# Patient Record
Sex: Male | Born: 1960 | Race: White | Hispanic: No | Marital: Single | Smoking: Current every day smoker
Health system: Southern US, Community
[De-identification: ages and names within clinical notes are randomized; demographics above are authoritative.]

## PROBLEM LIST (undated history)

## (undated) DIAGNOSIS — S2249XA Multiple fractures of ribs, unspecified side, initial encounter for closed fracture: Secondary | ICD-10-CM

## (undated) DIAGNOSIS — IMO0002 Reserved for concepts with insufficient information to code with codable children: Secondary | ICD-10-CM

## (undated) DIAGNOSIS — K219 Gastro-esophageal reflux disease without esophagitis: Secondary | ICD-10-CM

## (undated) DIAGNOSIS — IMO0001 Reserved for inherently not codable concepts without codable children: Secondary | ICD-10-CM

## (undated) DIAGNOSIS — B3781 Candidal esophagitis: Secondary | ICD-10-CM

## (undated) DIAGNOSIS — I10 Essential (primary) hypertension: Secondary | ICD-10-CM

## (undated) DIAGNOSIS — F32A Depression, unspecified: Secondary | ICD-10-CM

## (undated) DIAGNOSIS — R519 Headache, unspecified: Secondary | ICD-10-CM

## (undated) DIAGNOSIS — E785 Hyperlipidemia, unspecified: Secondary | ICD-10-CM

## (undated) DIAGNOSIS — R51 Headache: Secondary | ICD-10-CM

## (undated) DIAGNOSIS — S02113A Unspecified occipital condyle fracture, initial encounter for closed fracture: Secondary | ICD-10-CM

## (undated) DIAGNOSIS — M542 Cervicalgia: Secondary | ICD-10-CM

## (undated) DIAGNOSIS — R1319 Other dysphagia: Secondary | ICD-10-CM

## (undated) DIAGNOSIS — J449 Chronic obstructive pulmonary disease, unspecified: Secondary | ICD-10-CM

## (undated) DIAGNOSIS — K4090 Unilateral inguinal hernia, without obstruction or gangrene, not specified as recurrent: Secondary | ICD-10-CM

## (undated) DIAGNOSIS — D509 Iron deficiency anemia, unspecified: Secondary | ICD-10-CM

## (undated) HISTORY — DX: Other dysphagia: R13.19

## (undated) HISTORY — PX: LUMBAR FUSION: SHX111

## (undated) HISTORY — DX: Hyperlipidemia, unspecified: E78.5

## (undated) HISTORY — DX: Unspecified occipital condyle fracture, initial encounter for closed fracture: S02.113A

## (undated) HISTORY — DX: Iron deficiency anemia, unspecified: D50.9

## (undated) HISTORY — DX: Essential (primary) hypertension: I10

## (undated) HISTORY — DX: Chronic obstructive pulmonary disease, unspecified: J44.9

## (undated) HISTORY — PX: BACK SURGERY: SHX140

## (undated) HISTORY — DX: Cervicalgia: M54.2

## (undated) HISTORY — DX: Unilateral inguinal hernia, without obstruction or gangrene, not specified as recurrent: K40.90

## (undated) HISTORY — DX: Multiple fractures of ribs, unspecified side, initial encounter for closed fracture: S22.49XA

## (undated) HISTORY — PX: OTHER SURGICAL HISTORY: SHX169

## (undated) HISTORY — DX: Reserved for concepts with insufficient information to code with codable children: IMO0002

## (undated) HISTORY — DX: Candidal esophagitis: B37.81

---

## 2005-08-30 ENCOUNTER — Emergency Department (HOSPITAL_COMMUNITY): Admission: EM | Admit: 2005-08-30 | Discharge: 2005-08-30 | Payer: Self-pay | Admitting: Emergency Medicine

## 2008-11-24 ENCOUNTER — Emergency Department (HOSPITAL_COMMUNITY): Admission: EM | Admit: 2008-11-24 | Discharge: 2008-11-24 | Payer: Self-pay | Admitting: Emergency Medicine

## 2009-03-06 ENCOUNTER — Emergency Department (HOSPITAL_COMMUNITY): Admission: EM | Admit: 2009-03-06 | Discharge: 2009-03-06 | Payer: Self-pay | Admitting: Emergency Medicine

## 2009-03-14 ENCOUNTER — Emergency Department (HOSPITAL_COMMUNITY): Admission: EM | Admit: 2009-03-14 | Discharge: 2009-03-14 | Payer: Self-pay | Admitting: Emergency Medicine

## 2009-03-16 ENCOUNTER — Encounter (HOSPITAL_COMMUNITY): Admission: RE | Admit: 2009-03-16 | Discharge: 2009-04-15 | Payer: Self-pay | Admitting: Emergency Medicine

## 2010-03-30 ENCOUNTER — Emergency Department (HOSPITAL_COMMUNITY): Admission: EM | Admit: 2010-03-30 | Discharge: 2010-03-30 | Payer: Self-pay | Admitting: Emergency Medicine

## 2011-04-23 ENCOUNTER — Encounter: Payer: Self-pay | Admitting: Emergency Medicine

## 2011-04-23 ENCOUNTER — Emergency Department (HOSPITAL_COMMUNITY)
Admission: EM | Admit: 2011-04-23 | Discharge: 2011-04-23 | Disposition: A | Discharge status: Home / Self Care | Payer: Self-pay | Attending: Emergency Medicine | Admitting: Emergency Medicine

## 2011-04-23 DIAGNOSIS — M79609 Pain in unspecified limb: Secondary | ICD-10-CM | POA: Insufficient documentation

## 2011-04-23 DIAGNOSIS — M5416 Radiculopathy, lumbar region: Secondary | ICD-10-CM

## 2011-04-23 DIAGNOSIS — Z79899 Other long term (current) drug therapy: Secondary | ICD-10-CM | POA: Insufficient documentation

## 2011-04-23 DIAGNOSIS — F17200 Nicotine dependence, unspecified, uncomplicated: Secondary | ICD-10-CM | POA: Insufficient documentation

## 2011-04-23 DIAGNOSIS — IMO0002 Reserved for concepts with insufficient information to code with codable children: Secondary | ICD-10-CM | POA: Insufficient documentation

## 2011-04-23 MED ORDER — PREDNISONE 20 MG PO TABS
60.0000 mg | ORAL_TABLET | Freq: Once | ORAL | Status: AC
  Administered 2011-04-23: 60 mg via ORAL
  Filled 2011-04-23: qty 3

## 2011-04-23 MED ORDER — PREDNISONE 50 MG PO TABS: 50.0000 mg | ORAL_TABLET | Freq: Every day | ORAL | Status: AC

## 2011-04-23 MED ORDER — OXYCODONE-ACETAMINOPHEN 5-325 MG PO TABS: ORAL_TABLET | ORAL | Status: DC

## 2011-04-23 MED ORDER — OXYCODONE-ACETAMINOPHEN 5-325 MG PO TABS
1.0000 | ORAL_TABLET | Freq: Once | ORAL | Status: AC
  Administered 2011-04-23: 1 via ORAL
  Filled 2011-04-23: qty 1

## 2011-04-23 NOTE — ED Notes (Addendum)
Patient c/o right leg pain x1 week. Patient denies any recent injury. Reports history of back surgery. Had laceration to leg last year, had problems in which he had to go to wound clinic.

## 2011-04-23 NOTE — ED Provider Notes (Signed)
History     CSN: [id number] Arrival date & time: 04/23/2011 11:25 AM  Chief Complaint  Patient presents with  . Leg Pain    (Consider location/radiation/quality/duration/timing/severity/associated sxs/prior treatment) HPI Comments: States he has known ruptured disc.    Patient is a 50 y.o. male presenting with leg pain. The history is provided by the patient. No language interpreter was used.  Leg Pain  The incident occurred more than 1 week ago. There was no injury mechanism. Pain location: R calf to toes. The quality of the pain is described as throbbing. The pain is at a severity of 8/10. The pain has been constant since onset. The symptoms are aggravated by bearing weight (bending). He has tried nothing for the symptoms.    History reviewed. No pertinent past medical history.  Past Surgical History  Procedure Date  . Back surgery     Family History  Problem Relation Age of Onset  . Diabetes Mother     History  Substance Use Topics  . Smoking status: Current Everyday Smoker -- 3.5 packs/day for 37 years    Types: Cigarettes  . Smokeless tobacco: Never Used  . Alcohol Use: 7.2 oz/week    12 Cans of beer per week      Review of Systems  All other systems reviewed and are negative.    Allergies  Aleve  Home Medications   Current Outpatient Rx  Name Route Sig Dispense Refill  . BC FAST PAIN RELIEF PO Oral Take 2 packets by mouth daily as needed. For pain     . IBUPROFEN 200 MG PO TABS Oral Take 800 mg by mouth every 6 (six) hours as needed. For pain       BP 131/93  Pulse 72  Temp(Src) 97.9 F (36.6 C) (Oral)  Resp 16  Ht 6\' 3"  (1.905 m)  Wt 185 lb (83.915 kg)  BMI 23.12 kg/m2  SpO2 100%  Physical Exam  Nursing note and vitals reviewed. Constitutional: He is oriented to person, place, and time. Vital signs are normal. He appears well-developed and well-nourished.  HENT:  Head: Normocephalic and atraumatic.  Right Ear: External ear normal.  Left  Ear: External ear normal.  Nose: Nose normal.  Mouth/Throat: No oropharyngeal exudate.  Eyes: Conjunctivae and EOM are normal. Pupils are equal, round, and reactive to light. Right eye exhibits no discharge. Left eye exhibits no discharge. No scleral icterus.  Neck: Normal range of motion. Neck supple. No JVD present. No tracheal deviation present. No thyromegaly present.  Cardiovascular: Normal rate, regular rhythm, normal heart sounds, intact distal pulses and normal pulses.  Exam reveals no gallop and no friction rub.   No murmur heard. Pulmonary/Chest: Effort normal and breath sounds normal. No stridor. No respiratory distress. He has no wheezes. He has no rales. He exhibits no tenderness.  Abdominal: Soft. Normal appearance and bowel sounds are normal. He exhibits no distension and no mass. There is no tenderness. There is no rebound and no guarding.  Musculoskeletal: Normal range of motion. He exhibits no edema and no tenderness.       Right lower leg: He exhibits no tenderness, no bony tenderness, no swelling, no edema, no deformity and no laceration.       Legs: Lymphadenopathy:    He has no cervical adenopathy.  Neurological: He is alert and oriented to person, place, and time. He has normal reflexes. No cranial nerve deficit or sensory deficit. Coordination normal. GCS eye subscore is 4. GCS verbal subscore  is 5. GCS motor subscore is 6.  Skin: Skin is warm and dry. No rash noted. He is not diaphoretic.  Psychiatric: He has a normal mood and affect. His speech is normal and behavior is normal. Judgment and thought content normal. Cognition and memory are normal.    ED Course  Procedures (including critical care time)  Labs Reviewed - No data to display No results found.   No diagnosis found.    MDM          Worthy Rancher, PA 04/23/11 1247

## 2011-04-23 NOTE — ED Provider Notes (Signed)
Medical screening examination/treatment/procedure(s) were performed by non-physician practitioner and as supervising physician I was immediately available for consultation/collaboration.  Laelani Vasko, MD 04/23/11 1523 

## 2011-04-23 NOTE — ED Notes (Signed)
Right side leg pain that pt states that he has had "for awhile" became worse over the past week or so, pain starts at buttock area sometimes and radiates down, worse pain is from knee level down, pt admits to weakness in right leg at times since the pain has become worse, hx of lower back problems, denies any injury, deneis any problems with bowel or bladder incontinence. Cms intact distal

## 2011-10-09 ENCOUNTER — Emergency Department (HOSPITAL_COMMUNITY)
Admission: EM | Admit: 2011-10-09 | Discharge: 2011-10-09 | Disposition: A | Discharge status: Home / Self Care | Payer: Self-pay | Attending: Emergency Medicine | Admitting: Emergency Medicine

## 2011-10-09 ENCOUNTER — Encounter (HOSPITAL_COMMUNITY): Payer: Self-pay | Admitting: *Deleted

## 2011-10-09 DIAGNOSIS — K4090 Unilateral inguinal hernia, without obstruction or gangrene, not specified as recurrent: Secondary | ICD-10-CM | POA: Insufficient documentation

## 2011-10-09 DIAGNOSIS — F17200 Nicotine dependence, unspecified, uncomplicated: Secondary | ICD-10-CM | POA: Insufficient documentation

## 2011-10-09 MED ORDER — IBUPROFEN 800 MG PO TABS: 800.0000 mg | ORAL_TABLET | Freq: Three times a day (TID) | ORAL | Status: AC

## 2011-10-09 MED ORDER — HYDROCODONE-ACETAMINOPHEN 5-325 MG PO TABS: 1.0000 | ORAL_TABLET | Freq: Four times a day (QID) | ORAL | Status: AC | PRN

## 2011-10-09 NOTE — Discharge Instructions (Signed)
Hernia A hernia happens when an organ inside your body pushes out through a weak spot in your belly (abdominal) wall. Most hernias get worse over time. They can often be pushed back into place (reduced). Surgery may be needed to repair hernias that cannot be pushed into place. HOME CARE  Keep doing normal activities.   Avoid lifting more than 10 pounds (4.5 kilograms).   Cough gently and avoid straining. Over time, these things will:   Increase your hernia size.   Irritate your hernia.   Break down hernia repairs.   Stop smoking.   Do not wear anything tight over your hernia. Do not keep the hernia in with an outside bandage.   Eat food that is high in fiber (fruit, vegetables, whole grains).   Drink enough fluids to keep your pee (urine) clear or pale yellow.   Take medicines to make your poop soft (stool softeners) if you cannot poop (constipated).  GET HELP RIGHT AWAY IF:   You have a fever.   You have belly pain that gets worse.   You feel sick to your stomach (nauseous) and throw up (vomit).   Your skin starts to bulge out.   Your hernia turns a different color, feels hard, or is tender.   You have increased pain or puffiness (swelling) around the hernia.   You poop more or less often.   Your poop does not look the way normally does.   You have watery poop (diarrhea).   You cannot push the hernia back in place by applying gentle pressure while lying down.  MAKE SURE YOU:   Understand these instructions.   Will watch your condition.   Will get help right away if you are not doing well or get worse.  Document Released: 12/21/2009 Document Revised: 06/22/2011 Document Reviewed: 12/21/2009 [facility] Patient Information 2012 [place], [place].  Called Dr. Lovell Sheehan the surgeon for followup and repair of your left inguinal hernia.

## 2011-10-09 NOTE — ED Notes (Signed)
Pt c/o left groin pain, ?hernia, states that the pain started last month. Denies any injury.

## 2011-10-09 NOTE — ED Provider Notes (Signed)
History   This chart was scribed for Shelda Jakes, MD by Sofie Rower. The patient was seen in room APFT20/APFT20 and the patient's care was started at 3:52PM.    CSN: [id number]  Arrival date & time 10/09/11  1527   First MD Initiated Contact with Patient 10/09/11 1547      Chief Complaint  Patient presents with  . Hernia    (Consider location/radiation/quality/duration/timing/severity/associated sxs/prior treatment) HPI  Sean Thomas is a 51 y.o. male who presents to the Emergency Department complaining of moderate, constant groin pain located in the left groin onset one month ago (Feb. 10th) with associated symptoms of back pain. Pt states the pain is a 10/10. Modifying factors include taking advill with no relief. Pt has a hx of back surgery.   Pt denies nausea, vomiting, chest pain, abdominal pain, swelling in legs, dysuria.      Past Surgical History  Procedure Date  . Back surgery     Family History  Problem Relation Age of Onset  . Diabetes Mother     History  Substance Use Topics  . Smoking status: Current Everyday Smoker -- 3.5 packs/day for 37 years    Types: Cigarettes  . Smokeless tobacco: Never Used  . Alcohol Use: 7.2 oz/week    12 Cans of beer per week      Review of Systems  All other systems reviewed and are negative.    10 Systems reviewed and are negative for acute change except as noted in the HPI.   Allergies  Aleve  Home Medications   Current Outpatient Rx  Name Route Sig Dispense Refill  . BC FAST PAIN RELIEF PO Oral Take 2 packets by mouth daily as needed. For pain     . HYDROCODONE-ACETAMINOPHEN 5-325 MG PO TABS Oral Take 1-2 tablets by mouth every 6 (six) hours as needed for pain. 14 tablet 0  . IBUPROFEN 200 MG PO TABS Oral Take 800 mg by mouth every 6 (six) hours as needed. For pain     . IBUPROFEN 800 MG PO TABS Oral Take 1 tablet (800 mg total) by mouth 3 (three) times daily. 30 tablet 0  . OXYCODONE-ACETAMINOPHEN  5-325 MG PO TABS  One po q 6 hrs prn pain 20 tablet 0    BP 157/90  Pulse 75  Temp(Src) 97.3 F (36.3 C) (Oral)  Resp 20  Ht 6\' 3"  (1.905 m)  Wt 180 lb (81.647 kg)  BMI 22.50 kg/m2  SpO2 98%  Physical Exam  Nursing note and vitals reviewed. Constitutional: He is oriented to person, place, and time. He appears well-developed and well-nourished.  HENT:  Head: Normocephalic and atraumatic.  Nose: Nose normal.  Eyes: Conjunctivae and EOM are normal. No scleral icterus.  Neck: Neck supple. No thyromegaly present.  Cardiovascular: Normal rate, regular rhythm and normal heart sounds.  Exam reveals no gallop and no friction rub.   No murmur heard. Pulmonary/Chest: Breath sounds normal. No stridor. He has no wheezes. He has no rales. He exhibits no tenderness.  Abdominal: Bowel sounds are normal. He exhibits no distension. There is no tenderness. There is no rebound.  Genitourinary:       Circumcised, testicles centered bilaterally, non tender, right side has no hernia, small hernia left side, easily reducible.   Musculoskeletal: Normal range of motion. He exhibits no edema.  Lymphadenopathy:    He has no cervical adenopathy.  Neurological: He is alert and oriented to person, place, and time. Coordination normal.  Skin: Skin is warm and dry. No rash noted. No erythema.  Psychiatric: He has a normal mood and affect. His behavior is normal.    ED Course  Procedures (including critical care time)  DIAGNOSTIC STUDIES: Oxygen Saturation is 98% on room air, normal by my interpretation.    COORDINATION OF CARE:     Labs Reviewed - No data to display No results found.   1. Inguinal hernia       3:56PM- EDP at bedside discusses treatment plan.   MDM   Physical findings consistent with reducible left inguinal hernia. Will refer to Gen. Surgery for repair. No evidence of testicular mass or epididymitis.   I personally performed the services described in this documentation,  which was scribed in my presence. The recorded information has been reviewed and considered.       Shelda Jakes, MD 10/09/11 [id number]

## 2012-11-01 DIAGNOSIS — S2249XA Multiple fractures of ribs, unspecified side, initial encounter for closed fracture: Secondary | ICD-10-CM | POA: Insufficient documentation

## 2012-11-01 HISTORY — DX: Multiple fractures of ribs, unspecified side, initial encounter for closed fracture: S22.49XA

## 2012-11-21 DIAGNOSIS — M542 Cervicalgia: Secondary | ICD-10-CM

## 2012-11-21 DIAGNOSIS — S02113A Unspecified occipital condyle fracture, initial encounter for closed fracture: Secondary | ICD-10-CM

## 2012-11-21 HISTORY — DX: Cervicalgia: M54.2

## 2012-11-21 HISTORY — DX: Unspecified occipital condyle fracture, initial encounter for closed fracture: S02.113A

## 2013-08-27 ENCOUNTER — Ambulatory Visit: Payer: Medicaid Other | Admitting: Family Medicine

## 2013-09-17 ENCOUNTER — Telehealth: Payer: Self-pay | Admitting: Family Medicine

## 2013-09-25 NOTE — Telephone Encounter (Signed)
Patient got appt at another office

## 2013-12-11 ENCOUNTER — Ambulatory Visit (INDEPENDENT_AMBULATORY_CARE_PROVIDER_SITE_OTHER): Payer: Medicaid Other | Admitting: General Surgery

## 2013-12-11 ENCOUNTER — Encounter (INDEPENDENT_AMBULATORY_CARE_PROVIDER_SITE_OTHER): Payer: Self-pay

## 2013-12-11 ENCOUNTER — Telehealth (INDEPENDENT_AMBULATORY_CARE_PROVIDER_SITE_OTHER): Payer: Self-pay

## 2013-12-11 ENCOUNTER — Encounter (INDEPENDENT_AMBULATORY_CARE_PROVIDER_SITE_OTHER): Payer: Self-pay | Admitting: General Surgery

## 2013-12-11 VITALS — BP 124/76 | HR 80 | Temp 97.5°F | Ht 74.0 in | Wt 182.0 lb

## 2013-12-11 DIAGNOSIS — F17200 Nicotine dependence, unspecified, uncomplicated: Secondary | ICD-10-CM

## 2013-12-11 DIAGNOSIS — K4090 Unilateral inguinal hernia, without obstruction or gangrene, not specified as recurrent: Secondary | ICD-10-CM

## 2013-12-11 DIAGNOSIS — J441 Chronic obstructive pulmonary disease with (acute) exacerbation: Secondary | ICD-10-CM

## 2013-12-11 NOTE — Addendum Note (Signed)
Addended by: Illene Regulus on: 12/11/2013 10:16 AM   Modules accepted: Orders

## 2013-12-11 NOTE — Telephone Encounter (Signed)
LMOM of pt's sister and I also called the other sister Mortimer Fries but didn't leave her a message. I have scheduled the pt for an appt with a pulmonologist at [facility] to see Dr Baltazar Apo on 12/19/13 arrive at 1:30/1:45. I asked for a call back to confirm they received the appt time b/c this appt is for pulmonary clearance in order to get scheduled for surgery.

## 2013-12-11 NOTE — Progress Notes (Signed)
Patient ID: Sean Thomas, male   DOB: [birth year], 53 y.o.   MRN: [id number]  Chief Complaint  Patient presents with  . Incisional Hernia    HPI Sean Thomas is a 53 y.o. male.  The patient is a 53 year old male who is referred by Sean Mire, MD for evaluation of a left inguinal hernia. Patient states it's been there for several months. He states causing pain in his left inguinal area. He states this occurred while moving bricks.  He states he's noticed a bulge that time.  On the patient states he is an active smoker. He states he has COPD has oxygen at home but is not able to easily. He states he cannot get through grocery store without having to stop and catch his breath due to shortness of breath. He is on a daily inhaler as well as a rescue inhaler.  HPI  Past Medical History  Diagnosis Date  . COPD (chronic obstructive pulmonary disease)   . Hyperlipidemia   . Hypertension     Past Surgical History  Procedure Laterality Date  . Back surgery    . Back surgery      Family History  Problem Relation Age of Onset  . Diabetes Mother     Social History History  Substance Use Topics  . Smoking status: Current Every Day Smoker -- 3.50 packs/day for 37 years    Types: Cigarettes  . Smokeless tobacco: Never Used  . Alcohol Use: 7.2 oz/week    12 Cans of beer per week    Allergies  Allergen Reactions  . Aleve [Naproxen Sodium] Nausea And Vomiting    diarrhea    Current Outpatient Prescriptions  Medication Sig Dispense Refill  . Aspirin-Caffeine (BC FAST PAIN RELIEF PO) Take 2 packets by mouth daily as needed. For pain       . gabapentin (NEURONTIN) 100 MG capsule Take 100 mg by mouth 3 (three) times daily.      Marland Kitchen ibuprofen (ADVIL,MOTRIN) 200 MG tablet Take 800 mg by mouth every 6 (six) hours as needed. For pain       . oxyCODONE-acetaminophen (PERCOCET) 5-325 MG per tablet One po q 6 hrs prn pain  20 tablet  0   No current facility-administered medications for  this visit.    Review of Systems Review of Systems  Constitutional: Negative.   HENT: Negative.   Eyes: Negative.   Respiratory: Negative.   Cardiovascular: Negative.   Gastrointestinal: Negative.   Endocrine: Negative.   Neurological: Negative.     Blood pressure 124/76, pulse 80, temperature 97.5 F (36.4 C), height 6\' 2"  (1.88 m), weight 182 lb (82.555 kg).  Physical Exam Physical Exam  Constitutional: He is oriented to person, place, and time. He appears well-developed and well-nourished.  HENT:  Head: Normocephalic and atraumatic.  Eyes: Conjunctivae and EOM are normal. Pupils are equal, round, and reactive to light.  Neck: Normal range of motion. Neck supple.  Cardiovascular: Normal rate, regular rhythm and normal heart sounds.   Pulmonary/Chest: Effort normal and breath sounds normal.  Abdominal: Soft. Bowel sounds are normal. A hernia is present. Hernia confirmed positive in the left inguinal area. Hernia confirmed negative in the right inguinal area.  Musculoskeletal: Normal range of motion.  Neurological: He is alert and oriented to person, place, and time.  Skin: Skin is warm and dry.    Data Reviewed none  Assessment    53 year old male with a left inguinal hernia. Patient also has a history  of COPD, and is set up with home oxygen however does not use at home. The patient currently smokes.     Plan    1. We'll have the patient evaluated for any pulmonary issues, and to be optimized from a pulmonary point of view. After this is done we can schedule him for surgery. 2. Once receive clearance will schedule him for a laparoscopic left inguinal hernia repair with mesh. 3. All risks and benefits were discussed with the patient, to generally include infection, bleeding, damage to surrounding structures, acute and chronic nerve pain, and recurrence. Alternatives were offered and described.  All questions were answered and the patient voiced understanding of the  procedure and wishes to proceed at this point.         Ralene Ok 12/11/2013, 9:39 AM

## 2013-12-11 NOTE — Telephone Encounter (Signed)
Sister called back and confirmed she received your msg.

## 2013-12-18 ENCOUNTER — Encounter (INDEPENDENT_AMBULATORY_CARE_PROVIDER_SITE_OTHER): Payer: Self-pay

## 2013-12-19 ENCOUNTER — Ambulatory Visit (INDEPENDENT_AMBULATORY_CARE_PROVIDER_SITE_OTHER)
Admission: RE | Admit: 2013-12-19 | Discharge: 2013-12-19 | Disposition: A | Discharge status: Home / Self Care | Payer: Medicaid Other | Source: Ambulatory Visit | Attending: Emergency Medicine | Admitting: Emergency Medicine

## 2013-12-19 ENCOUNTER — Encounter: Payer: Self-pay | Admitting: Emergency Medicine

## 2013-12-19 ENCOUNTER — Ambulatory Visit (INDEPENDENT_AMBULATORY_CARE_PROVIDER_SITE_OTHER): Payer: Medicaid Other | Admitting: Emergency Medicine

## 2013-12-19 ENCOUNTER — Other Ambulatory Visit: Payer: Medicaid Other

## 2013-12-19 VITALS — BP 140/84 | HR 74 | Ht 74.0 in | Wt 184.4 lb

## 2013-12-19 DIAGNOSIS — K4090 Unilateral inguinal hernia, without obstruction or gangrene, not specified as recurrent: Secondary | ICD-10-CM

## 2013-12-19 DIAGNOSIS — R079 Chest pain, unspecified: Secondary | ICD-10-CM

## 2013-12-19 DIAGNOSIS — J449 Chronic obstructive pulmonary disease, unspecified: Secondary | ICD-10-CM

## 2013-12-19 DIAGNOSIS — J441 Chronic obstructive pulmonary disease with (acute) exacerbation: Secondary | ICD-10-CM

## 2013-12-19 DIAGNOSIS — J4489 Other specified chronic obstructive pulmonary disease: Secondary | ICD-10-CM

## 2013-12-19 HISTORY — DX: Unilateral inguinal hernia, without obstruction or gangrene, not specified as recurrent: K40.90

## 2013-12-19 NOTE — Patient Instructions (Signed)
We will change Spiriva to Anoro one inhalation daily You need to wear your Oxygen at 2L/min with walking and exertion CXR and lab work today Use albuterol (ProAir) 2 puffs if needed for shortness of breath We will refer you see cardiology for evaluation before any surgery.  Your COPD puts you at high risk for an operation, but this does not mean you cannot have an operation if the benefits outweigh the risks. We will forward this information to Dr Rosendo Gros.  Follow with Dr Lamonte Sakai in 1 month
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
## 2013-12-19 NOTE — Assessment & Plan Note (Addendum)
Clinically severe with ? response to Spiriva. 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 He has been given O2 for home use, does not use it.  - would like to try a change to Anoro to see if he benefits - he has cut down to 5 cigarettes a day > instructed him NOT to increase pre-op - walking oximetry today confirms that he does desat > needs to wear O2 with all exertion - a1-AT testing - CXR today - rov 1 - he will qualify for LDCT screening later

## 2013-12-19 NOTE — Addendum Note (Signed)
Addended by: Carlos American A on: 12/19/2013 02:56 PM   Modules accepted: Orders

## 2013-12-19 NOTE — Assessment & Plan Note (Signed)
He clearly has COPD, severe by spirometry. This puts him at high risk for any surgery or general anesthesia but does not preclude a surgery. In fact, he just had dental surgery that he tolerated well.  - needs cards risk stratification - will try to optimize fxn before any procedure - clarify o2 needs - instructed him to not increase his cigarettes.  -

## 2013-12-22 LAB — ALPHA-1-ANTITRYPSIN: A1 ANTITRYPSIN SER: 160 mg/dL (ref 83–199)

## 2013-12-24 ENCOUNTER — Telehealth: Payer: Self-pay | Admitting: Emergency Medicine

## 2013-12-24 NOTE — Telephone Encounter (Signed)
Attempted to call x1 LMTCB 

## 2013-12-24 NOTE — Telephone Encounter (Signed)
Pt's sister Joni Reining) has been advised of lab result per RB.   She verbalized understanding and had no further questions

## 2014-01-05 ENCOUNTER — Telehealth: Payer: Self-pay | Admitting: Emergency Medicine

## 2014-01-05 DIAGNOSIS — J449 Chronic obstructive pulmonary disease, unspecified: Secondary | ICD-10-CM

## 2014-01-05 NOTE — Telephone Encounter (Signed)
Spoke with Mandy  Pt needing OCD titration  Order sent to [facility]

## 2014-01-21 ENCOUNTER — Ambulatory Visit (INDEPENDENT_AMBULATORY_CARE_PROVIDER_SITE_OTHER): Payer: Medicaid Other | Admitting: Emergency Medicine

## 2014-01-21 ENCOUNTER — Encounter: Payer: Self-pay | Admitting: Emergency Medicine

## 2014-01-21 VITALS — BP 118/80 | HR 64 | Ht 74.0 in | Wt 183.4 lb

## 2014-01-21 DIAGNOSIS — J449 Chronic obstructive pulmonary disease, unspecified: Secondary | ICD-10-CM

## 2014-01-21 MED ORDER — UMECLIDINIUM-VILANTEROL 62.5-25 MCG/INH IN AEPB: 1.0000 | INHALATION_SPRAY | Freq: Every day | RESPIRATORY_TRACT | Status: DC
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
## 2014-01-21 NOTE — Patient Instructions (Signed)
Please continue the anoro every day.  Wear your oxygen with exertion.  Keep your Cardiology Appointment as planned.  We will work on stopping smoking. Our goal for now will be to cut to 15 cigarettes a day.  Follow with Dr Lamonte Sakai in 3 months or sooner if you have any problems.

## 2014-01-21 NOTE — Assessment & Plan Note (Signed)
Please continue the anoro every day.  Wear your oxygen with exertion.  Keep your Cardiology Appointment as planned.  We will work on stopping smoking. Our goal for now will be to cut to 15 cigarettes a day.  Follow with Dr Lamonte Sakai in 3 months or sooner if you have any problems.

## 2014-01-21 NOTE — Addendum Note (Signed)
Addended by: Carlos American A on: 01/21/2014 10:36 AM   Modules accepted: Orders

## 2014-01-27 ENCOUNTER — Encounter: Payer: Self-pay | Admitting: *Deleted

## 2014-01-28 ENCOUNTER — Encounter: Payer: Self-pay | Admitting: *Deleted

## 2014-01-28 ENCOUNTER — Ambulatory Visit (INDEPENDENT_AMBULATORY_CARE_PROVIDER_SITE_OTHER): Payer: Medicaid Other | Admitting: Cardiology

## 2014-01-28 ENCOUNTER — Encounter: Payer: Self-pay | Admitting: Cardiology

## 2014-01-28 VITALS — BP 122/60 | HR 69 | Ht 74.0 in | Wt 182.0 lb

## 2014-01-28 DIAGNOSIS — R079 Chest pain, unspecified: Secondary | ICD-10-CM

## 2014-01-28 DIAGNOSIS — J439 Emphysema, unspecified: Secondary | ICD-10-CM

## 2014-01-28 DIAGNOSIS — J438 Other emphysema: Secondary | ICD-10-CM

## 2014-01-28 NOTE — Patient Instructions (Addendum)
Start aspirin 81mg  daily.   Your physician has requested that you have en exercise stress myoview. For further information please visit [email or web address]. Please follow instruction sheet, as given.  Your physician recommends that you schedule a follow-up appointment as needed with Dr Aundra Dubin.
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
## 2014-02-05 ENCOUNTER — Ambulatory Visit (HOSPITAL_COMMUNITY): Payer: Medicaid Other | Attending: Cardiology | Admitting: Radiology

## 2014-02-05 VITALS — BP 129/89 | HR 67 | Ht 74.0 in | Wt 170.0 lb

## 2014-02-05 DIAGNOSIS — J449 Chronic obstructive pulmonary disease, unspecified: Secondary | ICD-10-CM | POA: Diagnosis not present

## 2014-02-05 DIAGNOSIS — F17200 Nicotine dependence, unspecified, uncomplicated: Secondary | ICD-10-CM | POA: Diagnosis not present

## 2014-02-05 DIAGNOSIS — R42 Dizziness and giddiness: Secondary | ICD-10-CM | POA: Insufficient documentation

## 2014-02-05 DIAGNOSIS — Z01810 Encounter for preprocedural cardiovascular examination: Secondary | ICD-10-CM

## 2014-02-05 DIAGNOSIS — R079 Chest pain, unspecified: Secondary | ICD-10-CM | POA: Insufficient documentation

## 2014-02-05 DIAGNOSIS — I10 Essential (primary) hypertension: Secondary | ICD-10-CM | POA: Insufficient documentation

## 2014-02-05 DIAGNOSIS — R0609 Other forms of dyspnea: Secondary | ICD-10-CM | POA: Insufficient documentation

## 2014-02-05 DIAGNOSIS — R0989 Other specified symptoms and signs involving the circulatory and respiratory systems: Secondary | ICD-10-CM | POA: Diagnosis not present

## 2014-02-05 DIAGNOSIS — Z9981 Dependence on supplemental oxygen: Secondary | ICD-10-CM | POA: Diagnosis not present

## 2014-02-05 DIAGNOSIS — J4489 Other specified chronic obstructive pulmonary disease: Secondary | ICD-10-CM | POA: Insufficient documentation

## 2014-02-05 MED ORDER — TECHNETIUM TC 99M SESTAMIBI GENERIC - CARDIOLITE
30.0000 | Freq: Once | INTRAVENOUS | Status: AC | PRN
  Administered 2014-02-05: 30 via INTRAVENOUS

## 2014-02-05 MED ORDER — TECHNETIUM TC 99M SESTAMIBI GENERIC - CARDIOLITE
10.0000 | Freq: Once | INTRAVENOUS | Status: AC | PRN
  Administered 2014-02-05: 10 via INTRAVENOUS

## 2014-02-05 NOTE — Progress Notes (Addendum)
[facility] [address] [place], [place] [address] [phone]    Cardiology Nuclear Med Study  Sean Thomas is a 53 y.o. male     MRN : [id number]     DOB: [birth year]  Procedure Date: 02/05/2014  Nuclear Med Background Indication for Stress Test:  Evaluation for Ischemia, Surgical Clearance( Hernia Repair- Dr. Ralene Ok) and Abnormal EKG History:  No known CAD, COPD (home oxygen) Cardiac Risk Factors: Hypertension, Lipids and Smoker  Symptoms:  Chest Pain (last date of chest discomfort was one month ago), Dizziness and DOE   Nuclear Pre-Procedure Caffeine/Decaff Intake:  None NPO After: 8:00pm   Lungs:  clear O2 Sat: 98% on 2L O2 via Patient connected to nasal cannula oxygen. IV 0.9% NS with Angio Cath:  22g  IV Site: R Hand  IV Started by:  Crissie Figures, RN  Chest Size (in):  42 Cup Size: n/a  Height: 6\' 2"  (1.88 m)  Weight:  170 lb (77.111 kg)  BMI:  Body mass index is 21.82 kg/(m^2). Tech Comments:  N/A    Nuclear Med Study 1 or 2 day study: 1 day  Stress Test Type:  Stress  Reading MD: N/A  Order Authorizing Provider:  Loralie Champagne, MD  Resting Radionuclide: Technetium 45m Sestamibi  Resting Radionuclide Dose: 11.0 mCi   Stress Radionuclide:  Technetium 27m Sestamibi  Stress Radionuclide Dose: 33.0 mCi           Stress Protocol Rest HR: 67 Stress HR: 146  Rest BP: 129/89 Stress BP: 145/69  Exercise Time (min): 7:30 METS: 9.3           Dose of Adenosine (mg):  n/a Dose of Lexiscan: n/a mg  Dose of Atropine (mg): n/a Dose of Dobutamine: n/a mcg/kg/min (at max HR)  Stress Test Technologist: Glade Lloyd, BS-ES  Nuclear Technologist:  Vedia Pereyra, CNMT     Rest Procedure:  Myocardial perfusion imaging was performed at rest 45 minutes following the intravenous administration of Technetium 79m Sestamibi. Rest ECG: NSR - Normal EKG  Stress Procedure:  The patient exercised on the treadmill utilizing the Bruce Protocol  for 7:30 minutes. The patient stopped due to fatigue and denied any chest pain.  Technetium 58m Sestamibi was injected at peak exercise and myocardial perfusion imaging was performed after a brief delay. Stress ECG: No significant change from baseline ECG  QPS Raw Data Images:  Patient motion noted. Stress Images:  Normal homogeneous uptake in all areas of the myocardium. Rest Images:  Normal homogeneous uptake in all areas of the myocardium. Subtraction (SDS):  Normal Transient Ischemic Dilatation (Normal <1.22):  1.10 Lung/Heart Ratio (Normal <0.45):  0.34  Quantitative Gated Spect Images QGS EDV:  137 ml QGS ESV:  81 ml  Impression Exercise Capacity:  Fair exercise capacity. BP Response:  Normal blood pressure response. Clinical Symptoms:  No significant symptoms noted. ECG Impression:  No significant ST segment change suggestive of ischemia. Comparison with Prior Nuclear Study: No images to compare  Overall Impression:  Low risk stress nuclear study Thinning of the inferior base not thought to be significant  no ischemia.  LV Ejection Fraction: 41%.  LV Wall Motion:  Diffuse hypokinesis suggest MRI/Echo correlation   Sean Thomas  No ischemia but EF is low at 41%.  Would get echo to confirm EF.   Sean Thomas 02/08/2014

## 2014-02-09 ENCOUNTER — Encounter: Payer: Self-pay | Admitting: *Deleted

## 2014-02-09 ENCOUNTER — Other Ambulatory Visit: Payer: Self-pay | Admitting: *Deleted

## 2014-02-09 DIAGNOSIS — R079 Chest pain, unspecified: Secondary | ICD-10-CM

## 2014-02-09 NOTE — Progress Notes (Signed)
Pt's sister, Shawnee Knapp notified.

## 2014-02-19 ENCOUNTER — Ambulatory Visit (HOSPITAL_COMMUNITY): Payer: Medicaid Other | Attending: Cardiology

## 2014-02-19 ENCOUNTER — Other Ambulatory Visit (HOSPITAL_COMMUNITY): Payer: Medicaid Other

## 2014-02-19 DIAGNOSIS — R079 Chest pain, unspecified: Secondary | ICD-10-CM | POA: Diagnosis present

## 2014-02-19 NOTE — Progress Notes (Signed)
2D Echo completed. 02/19/2014

## 2014-04-13 ENCOUNTER — Telehealth: Payer: Self-pay | Admitting: Emergency Medicine

## 2014-04-13 DIAGNOSIS — J449 Chronic obstructive pulmonary disease, unspecified: Secondary | ICD-10-CM

## 2014-04-13 NOTE — Telephone Encounter (Signed)
lmtcb for [employer]

## 2014-04-14 NOTE — Telephone Encounter (Signed)
Please order the POC through [facility] with instructions to use the O2 settings that were just established during his titration for rest and exertion. Thanks

## 2014-04-14 NOTE — Telephone Encounter (Signed)
lmtcb for Mandy.  

## 2014-04-14 NOTE — Telephone Encounter (Signed)
Order was sent to [facility] 

## 2014-04-14 NOTE — Telephone Encounter (Signed)
Spoke with Leafy Ro at [facility]-- requesting an order for POC. Titration completed by [facility]- Pt wants POC ordered by Dr Lamonte Sakai.  Please advise

## 2014-05-19 ENCOUNTER — Encounter: Payer: Self-pay | Admitting: Emergency Medicine

## 2014-05-19 ENCOUNTER — Ambulatory Visit (INDEPENDENT_AMBULATORY_CARE_PROVIDER_SITE_OTHER): Payer: Medicaid Other | Admitting: Emergency Medicine

## 2014-05-19 VITALS — BP 138/92 | HR 73 | Temp 97.2°F | Ht 75.0 in | Wt 186.2 lb

## 2014-05-19 DIAGNOSIS — J449 Chronic obstructive pulmonary disease, unspecified: Secondary | ICD-10-CM

## 2014-05-19 NOTE — Patient Instructions (Signed)
Please continue your Anoro daily Wear your oxygen with all exertion You needs to work on stopping smoking. For now we set a goal of decreasing to no more than 15 cigarettes daily Follow with Dr Lamonte Sakai in 3 months or sooner if you have any problems.
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
## 2014-05-19 NOTE — Assessment & Plan Note (Signed)
Appears to be stable at this time. I explained to him that he is at high risk for progression of his obstructive disease, for lung cancer, for cardiac complications. He understands the reasons to stop smoking, is not ready to do so at this time. We will instead concentrate on decreasing.    Please continue your Anoro daily Wear your oxygen with all exertion You needs to work on stopping smoking. For now we set a goal of decreasing to no more than 15 cigarettes daily Follow with Dr Lamonte Sakai in 3 months or sooner if you have any problems.

## 2014-07-15 ENCOUNTER — Inpatient Hospital Stay (HOSPITAL_COMMUNITY): Admission: RE | Admit: 2014-07-15 | Payer: Medicaid Other | Source: Ambulatory Visit

## 2014-07-20 ENCOUNTER — Other Ambulatory Visit (INDEPENDENT_AMBULATORY_CARE_PROVIDER_SITE_OTHER): Payer: Self-pay

## 2014-07-20 ENCOUNTER — Encounter (HOSPITAL_COMMUNITY)
Admission: RE | Admit: 2014-07-20 | Discharge: 2014-07-20 | Disposition: A | Discharge status: Home / Self Care | Payer: Medicaid Other | Source: Ambulatory Visit | Attending: General Surgery | Admitting: General Surgery

## 2014-07-20 ENCOUNTER — Ambulatory Visit (INDEPENDENT_AMBULATORY_CARE_PROVIDER_SITE_OTHER): Payer: Self-pay | Admitting: General Surgery

## 2014-07-20 ENCOUNTER — Encounter (HOSPITAL_COMMUNITY): Payer: Self-pay

## 2014-07-20 DIAGNOSIS — Z886 Allergy status to analgesic agent status: Secondary | ICD-10-CM | POA: Diagnosis not present

## 2014-07-20 DIAGNOSIS — Z833 Family history of diabetes mellitus: Secondary | ICD-10-CM | POA: Diagnosis not present

## 2014-07-20 DIAGNOSIS — E785 Hyperlipidemia, unspecified: Secondary | ICD-10-CM | POA: Diagnosis not present

## 2014-07-20 DIAGNOSIS — I10 Essential (primary) hypertension: Secondary | ICD-10-CM | POA: Diagnosis not present

## 2014-07-20 DIAGNOSIS — Z9981 Dependence on supplemental oxygen: Secondary | ICD-10-CM | POA: Diagnosis not present

## 2014-07-20 DIAGNOSIS — F17210 Nicotine dependence, cigarettes, uncomplicated: Secondary | ICD-10-CM | POA: Diagnosis not present

## 2014-07-20 DIAGNOSIS — F1099 Alcohol use, unspecified with unspecified alcohol-induced disorder: Secondary | ICD-10-CM | POA: Diagnosis not present

## 2014-07-20 DIAGNOSIS — J441 Chronic obstructive pulmonary disease with (acute) exacerbation: Secondary | ICD-10-CM | POA: Diagnosis not present

## 2014-07-20 DIAGNOSIS — K4090 Unilateral inguinal hernia, without obstruction or gangrene, not specified as recurrent: Secondary | ICD-10-CM | POA: Diagnosis present

## 2014-07-20 HISTORY — DX: Reserved for inherently not codable concepts without codable children: IMO0001

## 2014-07-20 HISTORY — DX: Headache, unspecified: R51.9

## 2014-07-20 HISTORY — DX: Headache: R51

## 2014-07-20 LAB — BASIC METABOLIC PANEL
Anion gap: 3 — ABNORMAL LOW (ref 5–15)
BUN: 9 mg/dL (ref 6–23)
CHLORIDE: 105 meq/L (ref 96–112)
CO2: 29 mmol/L (ref 19–32)
Calcium: 8.7 mg/dL (ref 8.4–10.5)
Creatinine, Ser: 0.72 mg/dL (ref 0.50–1.35)
GFR calc non Af Amer: >90 mL/min (ref >90)
GLUCOSE: 84 mg/dL (ref 70–99)
POTASSIUM: 4 mmol/L (ref 3.5–5.1)
Sodium: 137 mmol/L (ref 135–145)

## 2014-07-20 LAB — CBC
HCT: 38.7 % — ABNORMAL LOW (ref 39.0–52.0)
HEMOGLOBIN: 11.8 g/dL — AB (ref 13.0–17.0)
MCH: 24.7 pg — AB (ref 26.0–34.0)
MCHC: 30.5 g/dL (ref 30.0–36.0)
MCV: 81.1 fL (ref 78.0–100.0)
Platelets: 322 10*3/uL (ref 150–400)
RBC: 4.77 MIL/uL (ref 4.22–5.81)
RDW: 15.5 % (ref 11.5–15.5)
WBC: 7.5 10*3/uL (ref 4.0–10.5)

## 2014-07-20 NOTE — Pre-Procedure Instructions (Signed)
Sean Thomas  07/20/2014   Your procedure is scheduled on:  07-24-2014   Friday   Report to [facility] Admitting at 7:00 AM.  Call this number if you have problems the morning of surgery: [phone]   Remember:   Do not eat food or drink liquids after midnight.    Take these medicines the morning of surgery with A SIP OF WATER: Inhalers as directed by MD.cyclobenaoprine(Flrxeril),cymbalta,gabapentin(Neurotin)omeprazole(Prilosec),pain medication as needed,Simvastatin(zocor)   Do not wear .  Do not wear lotions, powders, or perfumes. You may noy wear deodorant.  Do not shave 48 hours prior to surgery. Men may shave face and neck.  Do not bring valuables to the hospital.  [facility] is not responsible for any belongings or valuables.               Contacts, dentures or bridgework may not be worn into surgery.  Leave suitcase in the car. After surgery it may be brought to your room.  For patients admitted to the hospital, discharge time is determined by your  treatment team.               Patients discharged the day of surgery will not be allowed to drivehome.   Special Instructions: See attached sheet for instructions on CHG shower/bath   Please read over the following fact sheets that you were given: Pain Booklet and Coughing and Deep Breathing
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
## 2014-07-23 NOTE — Progress Notes (Signed)
Spoke with patient who stated he was aware of needing to arrive at 530 am 07/24/14.

## 2014-07-24 ENCOUNTER — Ambulatory Visit (HOSPITAL_COMMUNITY): Payer: Medicaid Other | Admitting: Anesthesiology

## 2014-07-24 ENCOUNTER — Ambulatory Visit (HOSPITAL_COMMUNITY)
Admission: RE | Admit: 2014-07-24 | Discharge: 2014-07-24 | Disposition: A | Discharge status: Home / Self Care | Payer: Medicaid Other | Source: Ambulatory Visit | Attending: General Surgery | Admitting: General Surgery

## 2014-07-24 ENCOUNTER — Ambulatory Visit (HOSPITAL_COMMUNITY): Payer: Medicaid Other | Admitting: Vascular Surgery

## 2014-07-24 ENCOUNTER — Encounter (HOSPITAL_COMMUNITY)
Admission: RE | Disposition: A | Discharge status: Home / Self Care | Payer: Self-pay | Source: Ambulatory Visit | Attending: General Surgery

## 2014-07-24 ENCOUNTER — Encounter (HOSPITAL_COMMUNITY): Payer: Self-pay | Admitting: *Deleted

## 2014-07-24 DIAGNOSIS — E785 Hyperlipidemia, unspecified: Secondary | ICD-10-CM | POA: Diagnosis not present

## 2014-07-24 DIAGNOSIS — Z886 Allergy status to analgesic agent status: Secondary | ICD-10-CM | POA: Insufficient documentation

## 2014-07-24 DIAGNOSIS — Z833 Family history of diabetes mellitus: Secondary | ICD-10-CM | POA: Insufficient documentation

## 2014-07-24 DIAGNOSIS — I10 Essential (primary) hypertension: Secondary | ICD-10-CM | POA: Diagnosis not present

## 2014-07-24 DIAGNOSIS — J441 Chronic obstructive pulmonary disease with (acute) exacerbation: Secondary | ICD-10-CM | POA: Diagnosis not present

## 2014-07-24 DIAGNOSIS — Z9981 Dependence on supplemental oxygen: Secondary | ICD-10-CM | POA: Insufficient documentation

## 2014-07-24 DIAGNOSIS — F1099 Alcohol use, unspecified with unspecified alcohol-induced disorder: Secondary | ICD-10-CM | POA: Insufficient documentation

## 2014-07-24 DIAGNOSIS — K4090 Unilateral inguinal hernia, without obstruction or gangrene, not specified as recurrent: Secondary | ICD-10-CM | POA: Diagnosis not present

## 2014-07-24 DIAGNOSIS — F17210 Nicotine dependence, cigarettes, uncomplicated: Secondary | ICD-10-CM | POA: Insufficient documentation

## 2014-07-24 HISTORY — PX: INGUINAL HERNIA REPAIR: SHX194

## 2014-07-24 HISTORY — PX: INSERTION OF MESH: SHX5868

## 2014-07-24 SURGERY — REPAIR, HERNIA, INGUINAL, LAPAROSCOPIC
Anesthesia: General | Site: Groin | Laterality: Left

## 2014-07-24 MED ORDER — BUPIVACAINE HCL 0.25 % IJ SOLN
INTRAMUSCULAR | Status: DC | PRN
  Administered 2014-07-24: 30 mL

## 2014-07-24 MED ORDER — 0.9 % SODIUM CHLORIDE (POUR BTL) OPTIME
TOPICAL | Status: DC | PRN
  Administered 2014-07-24: 1000 mL

## 2014-07-24 MED ORDER — OXYCODONE-ACETAMINOPHEN 7.5-325 MG PO TABS: 1.0000 | ORAL_TABLET | ORAL | Status: DC | PRN

## 2014-07-24 MED ORDER — MIDAZOLAM HCL 2 MG/2ML IJ SOLN
INTRAMUSCULAR | Status: AC
  Filled 2014-07-24: qty 2

## 2014-07-24 MED ORDER — BUPIVACAINE HCL (PF) 0.25 % IJ SOLN
INTRAMUSCULAR | Status: AC
  Filled 2014-07-24: qty 10

## 2014-07-24 MED ORDER — VECURONIUM BROMIDE 10 MG IV SOLR
INTRAVENOUS | Status: DC | PRN
  Administered 2014-07-24: 2 mg via INTRAVENOUS

## 2014-07-24 MED ORDER — FENTANYL CITRATE 0.05 MG/ML IJ SOLN
INTRAMUSCULAR | Status: AC
  Filled 2014-07-24: qty 5

## 2014-07-24 MED ORDER — EPHEDRINE SULFATE 50 MG/ML IJ SOLN
INTRAMUSCULAR | Status: DC | PRN
  Administered 2014-07-24: 10 mg via INTRAVENOUS

## 2014-07-24 MED ORDER — SODIUM CHLORIDE 0.9 % IJ SOLN: 3.0000 mL | INTRAMUSCULAR | Status: DC | PRN

## 2014-07-24 MED ORDER — ONDANSETRON HCL 4 MG/2ML IJ SOLN
INTRAMUSCULAR | Status: DC | PRN
  Administered 2014-07-24: 4 mg via INTRAVENOUS

## 2014-07-24 MED ORDER — SODIUM CHLORIDE 0.9 % IJ SOLN: 3.0000 mL | Freq: Two times a day (BID) | INTRAMUSCULAR | Status: DC

## 2014-07-24 MED ORDER — HYDROMORPHONE HCL 1 MG/ML IJ SOLN: 0.2500 mg | INTRAMUSCULAR | Status: DC | PRN

## 2014-07-24 MED ORDER — FENTANYL CITRATE 0.05 MG/ML IJ SOLN
INTRAMUSCULAR | Status: DC | PRN
  Administered 2014-07-24: 100 ug via INTRAVENOUS

## 2014-07-24 MED ORDER — SUCCINYLCHOLINE CHLORIDE 20 MG/ML IJ SOLN
INTRAMUSCULAR | Status: DC | PRN
  Administered 2014-07-24: 120 mg via INTRAVENOUS

## 2014-07-24 MED ORDER — OXYCODONE HCL 5 MG PO TABS: 5.0000 mg | ORAL_TABLET | ORAL | Status: DC | PRN

## 2014-07-24 MED ORDER — BUPIVACAINE HCL (PF) 0.25 % IJ SOLN
INTRAMUSCULAR | Status: AC
  Filled 2014-07-24: qty 30

## 2014-07-24 MED ORDER — CEFAZOLIN SODIUM-DEXTROSE 2-3 GM-% IV SOLR
INTRAVENOUS | Status: DC | PRN
  Administered 2014-07-24: 2 g via INTRAVENOUS

## 2014-07-24 MED ORDER — BUPIVACAINE-EPINEPHRINE (PF) 0.25% -1:200000 IJ SOLN
INTRAMUSCULAR | Status: AC
  Filled 2014-07-24: qty 30

## 2014-07-24 MED ORDER — PROPOFOL 10 MG/ML IV BOLUS
INTRAVENOUS | Status: DC | PRN
  Administered 2014-07-24: 150 mg via INTRAVENOUS

## 2014-07-24 MED ORDER — PROMETHAZINE HCL 25 MG/ML IJ SOLN: 6.2500 mg | INTRAMUSCULAR | Status: DC | PRN

## 2014-07-24 MED ORDER — ACETAMINOPHEN 325 MG PO TABS: 650.0000 mg | ORAL_TABLET | ORAL | Status: DC | PRN

## 2014-07-24 MED ORDER — NEOSTIGMINE METHYLSULFATE 10 MG/10ML IV SOLN
INTRAVENOUS | Status: DC | PRN
  Administered 2014-07-24: 3 mg via INTRAVENOUS

## 2014-07-24 MED ORDER — GLYCOPYRROLATE 0.2 MG/ML IJ SOLN
INTRAMUSCULAR | Status: DC | PRN
  Administered 2014-07-24: 0.4 mg via INTRAVENOUS

## 2014-07-24 MED ORDER — ACETAMINOPHEN 650 MG RE SUPP: 650.0000 mg | RECTAL | Status: DC | PRN

## 2014-07-24 MED ORDER — PROPOFOL 10 MG/ML IV BOLUS
INTRAVENOUS | Status: AC
  Filled 2014-07-24: qty 20

## 2014-07-24 MED ORDER — LIDOCAINE HCL (CARDIAC) 20 MG/ML IV SOLN
INTRAVENOUS | Status: DC | PRN
Start: 2014-07-24 — End: 2014-07-24
  Administered 2014-07-24: 80 mg via INTRAVENOUS

## 2014-07-24 MED ORDER — SODIUM CHLORIDE 0.9 % IV SOLN: 250.0000 mL | INTRAVENOUS | Status: DC | PRN

## 2014-07-24 MED ORDER — LACTATED RINGERS IV SOLN
INTRAVENOUS | Status: DC | PRN
  Administered 2014-07-24 (×2): via INTRAVENOUS

## 2014-07-24 SURGICAL SUPPLY — 40 items
BENZOIN TINCTURE PRP APPL 2/3 (GAUZE/BANDAGES/DRESSINGS) ×2 IMPLANT
CHLORAPREP W/TINT 26ML (MISCELLANEOUS) ×2 IMPLANT
CLSR STERI-STRIP ANTIMIC 1/2X4 (GAUZE/BANDAGES/DRESSINGS) ×2 IMPLANT
COVER SURGICAL LIGHT HANDLE (MISCELLANEOUS) ×2 IMPLANT
DRAPE LAPAROSCOPIC ABDOMINAL (DRAPES) ×2 IMPLANT
ELECT REM PT RETURN 9FT ADLT (ELECTROSURGICAL) ×2
ELECTRODE REM PT RTRN 9FT ADLT (ELECTROSURGICAL) ×1 IMPLANT
GAUZE SPONGE 2X2 8PLY STRL LF (GAUZE/BANDAGES/DRESSINGS) ×1 IMPLANT
GLOVE BIO SURGEON STRL SZ7.5 (GLOVE) ×2 IMPLANT
GLOVE BIOGEL PI IND STRL 7.0 (GLOVE) ×1 IMPLANT
GLOVE BIOGEL PI IND STRL 7.5 (GLOVE) ×1 IMPLANT
GLOVE BIOGEL PI INDICATOR 7.0 (GLOVE) ×1
GLOVE BIOGEL PI INDICATOR 7.5 (GLOVE) ×1
GOWN STRL REUS W/ TWL LRG LVL3 (GOWN DISPOSABLE) ×2 IMPLANT
GOWN STRL REUS W/ TWL XL LVL3 (GOWN DISPOSABLE) ×1 IMPLANT
GOWN STRL REUS W/TWL LRG LVL3 (GOWN DISPOSABLE) ×2
GOWN STRL REUS W/TWL XL LVL3 (GOWN DISPOSABLE) ×1
KIT BASIN OR (CUSTOM PROCEDURE TRAY) ×2 IMPLANT
KIT ROOM TURNOVER OR (KITS) ×2 IMPLANT
MESH 3DMAX 4X6 LT LRG (Mesh General) ×2 IMPLANT
NEEDLE INSUFFLATION 14GA 120MM (NEEDLE) ×2 IMPLANT
NS IRRIG 1000ML POUR BTL (IV SOLUTION) ×2 IMPLANT
PAD ARMBOARD 7.5X6 YLW CONV (MISCELLANEOUS) ×4 IMPLANT
RELOAD STAPLE HERNIA 4.0 BLUE (INSTRUMENTS) ×2 IMPLANT
RELOAD STAPLE HERNIA 4.8 BLK (STAPLE) IMPLANT
SCISSORS LAP 5X35 DISP (ENDOMECHANICALS) ×2 IMPLANT
SET TROCAR LAP APPLE-HUNT 5MM (ENDOMECHANICALS) ×2 IMPLANT
SPONGE GAUZE 2X2 STER 10/PKG (GAUZE/BANDAGES/DRESSINGS) ×1
STAPLER HERNIA 12 8.5 360D (INSTRUMENTS) ×2 IMPLANT
STRIP CLOSURE SKIN 1/2X4 (GAUZE/BANDAGES/DRESSINGS) ×2 IMPLANT
SUT MNCRL AB 4-0 PS2 18 (SUTURE) ×2 IMPLANT
SUT VIC AB 1 CT1 27 (SUTURE)
SUT VIC AB 1 CT1 27XBRD ANBCTR (SUTURE) IMPLANT
TAPE CLOTH SOFT 2X10 (GAUZE/BANDAGES/DRESSINGS) ×2 IMPLANT
TOWEL OR 17X24 6PK STRL BLUE (TOWEL DISPOSABLE) ×2 IMPLANT
TOWEL OR 17X26 10 PK STRL BLUE (TOWEL DISPOSABLE) ×2 IMPLANT
TRAY FOLEY CATH 16FR SILVER (SET/KITS/TRAYS/PACK) ×2 IMPLANT
TRAY LAPAROSCOPIC (CUSTOM PROCEDURE TRAY) ×2 IMPLANT
TROCAR XCEL 12X100 BLDLESS (ENDOMECHANICALS) ×2 IMPLANT
TUBING INSUFFLATION (TUBING) ×2 IMPLANT

## 2014-07-24 NOTE — Discharge Instructions (Signed)
[facility] _______[facility] ° °INGUINAL HERNIA REPAIR: POST OP INSTRUCTIONS ° °Always review your discharge instruction sheet given to you by the facility where your surgery was performed. °IF YOU HAVE DISABILITY OR FAMILY LEAVE FORMS, YOU MUST BRING THEM TO THE OFFICE FOR PROCESSING.   °DO NOT GIVE THEM TO YOUR DOCTOR. ° °1. A  prescription for pain medication may be given to you upon discharge.  Take your pain medication as prescribed, if needed.  If narcotic pain medicine is not needed, then you may take acetaminophen (Tylenol) or ibuprofen (Advil) as needed. °2. Take your usually prescribed medications unless otherwise directed. °3. If you need a refill on your pain medication, please contact your pharmacy.  They will contact our office to request authorization. Prescriptions will not be filled after 5 pm or on week-ends. °4. You should follow a light diet the first 24 hours after arrival home, such as soup and crackers, etc.  Be sure to include lots of fluids daily.  Resume your normal diet the day after surgery. °5. Most patients will experience some swelling and bruising around the umbilicus or in the groin and scrotum.  Ice packs and reclining will help.  Swelling and bruising can take several days to resolve.  °6. It is common to experience some constipation if taking pain medication after surgery.  Increasing fluid intake and taking a stool softener (such as Colace) will usually help or prevent this problem from occurring.  A mild laxative (Milk of Magnesia or Miralax) should be taken according to package directions if there are no bowel movements after 48 hours. °7. Unless discharge instructions indicate otherwise, you may remove your bandages 24-48 hours after surgery, and you may shower at that time.  You may have steri-strips (small skin tapes) in place directly over the incision.  These strips should be left on the skin for 7-10 days.  If your surgeon used skin glue on the incision, you  may shower in 24 hours.  The glue will flake off over the next 2-3 weeks.  Any sutures or staples will be removed at the office during your follow-up visit. °8. ACTIVITIES:  You may resume regular (light) daily activities beginning the next day--such as daily self-care, walking, climbing stairs--gradually increasing activities as tolerated.  You may have sexual intercourse when it is comfortable.  Refrain from any heavy lifting or straining until approved by your doctor. °a. You may drive when you are no longer taking prescription pain medication, you can comfortably wear a seatbelt, and you can safely maneuver your car and apply brakes. °b. RETURN TO WORK:  __________________________________________________________ °9. You should see your doctor in the office for a follow-up appointment approximately 2-3 weeks after your surgery.  Make sure that you call for this appointment within a day or two after you arrive home to insure a convenient appointment time. °10. OTHER INSTRUCTIONS:  __________________________________________________________________________________________________________________________________________________________________________________________  °WHEN TO CALL YOUR DOCTOR: °1. Fever over 101.0 °2. Inability to urinate °3. Nausea and/or vomiting °4. Extreme swelling or bruising °5. Continued bleeding from incision. °6. Increased pain, redness, or drainage from the incision ° °The clinic staff is available to answer your questions during regular business hours.  Please don’t hesitate to call and ask to speak to one of the nurses for clinical concerns.  If you have a medical emergency, go to the nearest emergency room or call 911.  A surgeon from [facility] is always on call at the hospital ° ° °[address], [address], [place], [place]  [address] ? ° [address] [address], [place], [place]   [address] °[phone] ? [phone] ? FAX [phone] °Web site:  [email or web address] ° °

## 2014-07-24 NOTE — Anesthesia Preprocedure Evaluation (Addendum)
Anesthesia Evaluation  Patient identified by MRN, date of birth, ID band Patient awake    Reviewed: Allergy & Precautions, NPO status   Airway Mallampati: II   Neck ROM: Full    Dental  (+) Edentulous Upper, Edentulous Lower   Pulmonary shortness of breath, COPDCurrent Smoker,  breath sounds clear to auscultation        Cardiovascular hypertension, Rhythm:Regular Rate:Normal     Neuro/Psych    GI/Hepatic negative GI ROS, Neg liver ROS,   Endo/Other  negative endocrine ROS  Renal/GU negative Renal ROS     Musculoskeletal  (+) Arthritis -,   Abdominal   Peds  Hematology   Anesthesia Other Findings   Reproductive/Obstetrics                            Anesthesia Physical Anesthesia Plan  ASA: II  Anesthesia Plan: General   Post-op Pain Management:    Induction: Intravenous  Airway Management Planned: LMA and Oral ETT  Additional Equipment:   Intra-op Plan:   Post-operative Plan: Extubation in OR  Informed Consent: I have reviewed the patients History and Physical, chart, labs and discussed the procedure including the risks, benefits and alternatives for the proposed anesthesia with the patient or authorized representative who has indicated his/her understanding and acceptance.   Dental advisory given  Plan Discussed with: CRNA and Surgeon  Anesthesia Plan Comments:         Anesthesia Quick Evaluation

## 2014-07-24 NOTE — Transfer of Care (Signed)
Immediate Anesthesia Transfer of Care Note  Patient: Sean Thomas  Procedure(s) Performed: Procedure(s): LAPAROSCOPIC LEFT INGUINAL HERNIA REPAIR  (Left) INSERTION OF MESH (Left)  Patient Location: PACU  Anesthesia Type:General  Level of Consciousness: awake, alert  and oriented  Airway & Oxygen Therapy: Patient Spontanous Breathing and Patient connected to nasal cannula oxygen  Post-op Assessment: Report given to PACU RN and Post -op Vital signs reviewed and stable  Post vital signs: Reviewed and stable  Complications: No apparent anesthesia complications

## 2014-07-24 NOTE — H&P (Signed)
HPI Sean Thomas is a 54 y.o. male. The patient is a 54 year old male who is referred by Juluis Mire, MD for evaluation of a left inguinal hernia. Patient states it's been there for several months. He states causing pain in his left inguinal area. He states this occurred while moving bricks. He states he's noticed a bulge that time.  On the patient states he is an active smoker. He states he has COPD has oxygen at home but is not able to easily. He states he cannot get through grocery store without having to stop and catch his breath due to shortness of breath. He is on a daily inhaler as well as a rescue inhaler.  HPI  Past Medical History  Diagnosis Date  . COPD (chronic obstructive pulmonary disease)   . Hyperlipidemia   . Hypertension     Past Surgical History  Procedure Laterality Date  . Back surgery    . Back surgery      Family History  Problem Relation Age of Onset  . Diabetes Mother     Social History History  Substance Use Topics  . Smoking status: Current Every Day Smoker -- 3.50 packs/day for 37 years    Types: Cigarettes  . Smokeless tobacco: Never Used  . Alcohol Use: 7.2 oz/week    12 Cans of beer per week    Allergies  Allergen Reactions  . Aleve [Naproxen Sodium] Nausea And Vomiting    diarrhea    Current Outpatient Prescriptions  Medication Sig Dispense Refill  . Aspirin-Caffeine (BC FAST PAIN RELIEF PO) Take 2 packets by mouth daily as needed. For pain     . gabapentin (NEURONTIN) 100 MG capsule Take 100 mg by mouth 3 (three) times daily.    Marland Kitchen ibuprofen (ADVIL,MOTRIN) 200 MG tablet Take 800 mg by mouth every 6 (six) hours as needed. For pain     . oxyCODONE-acetaminophen (PERCOCET) 5-325 MG per tablet One po q 6 hrs prn pain 20 tablet 0   No current facility-administered medications for this visit.    Review of Systems Review of  Systems  Constitutional: Negative.  HENT: Negative.  Eyes: Negative.  Respiratory: Negative.  Cardiovascular: Negative.  Gastrointestinal: Negative.  Endocrine: Negative.  Neurological: Negative.    BP 121/74 mmHg  Pulse 64  Temp(Src) 97.2 F (36.2 C) (Oral)  Resp 16  SpO2 97%   Physical Exam Physical Exam  Constitutional: He is oriented to person, place, and time. He appears well-developed and well-nourished.  HENT:  Head: Normocephalic and atraumatic.  Eyes: Conjunctivae and EOM are normal. Pupils are equal, round, and reactive to light.  Neck: Normal range of motion. Neck supple.  Cardiovascular: Normal rate, regular rhythm and normal heart sounds.  Pulmonary/Chest: Effort normal and breath sounds normal.  Abdominal: Soft. Bowel sounds are normal. A hernia is present. Hernia confirmed positive in the left inguinal area. Hernia confirmed negative in the right inguinal area.  Musculoskeletal: Normal range of motion.  Neurological: He is alert and oriented to person, place, and time.  Skin: Skin is warm and dry.    Data Reviewed none  Assessment    54 year old male with a left inguinal hernia. Patient also has a history of COPD, and is set up with home oxygen however does not use at home. The patient currently smokes.     Plan    1. We'll proceed with laparoscopic left inguinal hernia repair with mesh. 2. All risks and benefits were discussed with the patient, to  generally include infection, bleeding, damage to surrounding structures, acute and chronic nerve pain, and recurrence. Alternatives were offered and described. All questions were answered and the patient voiced understanding of the procedure and wishes to proceed at this point.

## 2014-07-24 NOTE — Op Note (Signed)
07/24/2014  8:24 AM  PATIENT:  Sean Thomas  54 y.o. male  PRE-OPERATIVE DIAGNOSIS:  Left inguinal hernia  POST-OPERATIVE DIAGNOSIS:  Left indirect inguinal hernia  PROCEDURE:  Procedure(s): LAPAROSCOPIC LEFT INGUINAL HERNIA REPAIR  (Left) INSERTION OF MESH (Left)  SURGEON:  Surgeon(s) and Role:    * Ralene Ok, MD - Primary  ASSISTANTS: none   ANESTHESIA:   local and general  EBL:  Total I/O In: 1000 [I.V.:1000] Out: -   BLOOD ADMINISTERED:none  DRAINS: none   LOCAL MEDICATIONS USED:  BUPIVICAINE   SPECIMEN:  No Specimen  DISPOSITION OF SPECIMEN:  N/A  COUNTS:  YES  TOURNIQUET:  * No tourniquets in log *  DICTATION: .Dragon Dictation  Counts: reported as correct x 2  Findings:  The patient had a small left indirect hernia  Indications for procedure:  The patient is a 54 year old male with a left hernia for several months. Patient complained of symptomatology to his left inguinal area. The patient was taken back for elective inguinal hernia repair.  Details of the procedure: The patient was taken back to the operating room. The patient was placed in supine position with bilateral SCDs in place.  The patient was prepped and draped in the usual sterile fashion.  After appropriate anitbiotics were confirmed, a time-out was confirmed and all facts were verified.  0.25% Marcaine was used to infiltrate the umbilical area. A 11-blade was used to cut down the skin and blunt dissection was used to get the anterior fashion.  The anterior fascia was incised approximately 1 cm and the muscles were retracted laterally. Blunt dissection was then used to create a space in the preperitoneal area. At this time a 10 mm camera was then introduced into the space and advanced the pubic tubercle and a 12 mm trocar was placed over this and insufflation was started.  At this time and space was created from medial to laterally the preperitoneal space.  Cooper's ligament was initially  cleaned off.  The hernia sac was identified in the indirect space. Dissection of the hernia sac was undertaken the vas deferens was identified and protected in all parts of the case.   Once the hernia sac was taken down to approximately the umbilicus a Bard 3D Max mesh was  introduced into the preperitoneal space.  The mesh was brought over the direct and indirect hernia spaces.  This was anchored into place and secured to Cooper's ligament with 4.81mm staples from a Coviden hernia stapler. It was anchored to the anterior abdominal wall with 4.8 mm staples. The hernia sac was seen lying anterior to the mesh. There was no staples placed laterally. The insufflation was evacuated. The trochars were removed. The anterior fascia was reapproximated using #1 Vicryl on a UR- 6.  Intra-abdominal air was evacuated and the Veress needle removed. The skin was reapproximated using 4-0 Monocryl subcuticular fashion the patient was awakened from general anesthesia and taken to recovery in stable condition.   PLAN OF CARE: Discharge to home after PACU  PATIENT DISPOSITION:  PACU - hemodynamically stable.   Delay start of Pharmacological VTE agent (>24hrs) due to surgical blood loss or risk of bleeding: not applicable

## 2014-07-24 NOTE — Anesthesia Postprocedure Evaluation (Signed)
  Anesthesia Post-op Note  Patient: Sean Thomas  Procedure(s) Performed: Procedure(s): LAPAROSCOPIC LEFT INGUINAL HERNIA REPAIR  (Left) INSERTION OF MESH (Left)  Patient Location: PACU  Anesthesia Type:General  Level of Consciousness: awake and alert   Airway and Oxygen Therapy: Patient Spontanous Breathing  Post-op Pain: mild  Post-op Assessment: Post-op Vital signs reviewed  Post-op Vital Signs: stable  Last Vitals:  Filed Vitals:   07/24/14 1102  BP: 133/92  Pulse: 63  Temp: 36.4 C  Resp:     Complications: No apparent anesthesia complications

## 2014-07-27 ENCOUNTER — Encounter (HOSPITAL_COMMUNITY): Payer: Self-pay | Admitting: General Surgery

## 2014-07-29 ENCOUNTER — Other Ambulatory Visit: Payer: Self-pay | Admitting: Emergency Medicine

## 2014-12-20 ENCOUNTER — Emergency Department (HOSPITAL_COMMUNITY): Payer: Medicaid Other

## 2014-12-20 ENCOUNTER — Encounter (HOSPITAL_COMMUNITY): Payer: Self-pay | Admitting: Emergency Medicine

## 2014-12-20 ENCOUNTER — Inpatient Hospital Stay (HOSPITAL_COMMUNITY)
Admission: EM | Admit: 2014-12-20 | Discharge: 2014-12-24 | DRG: 200 | Disposition: A | Discharge status: Home / Self Care | Payer: Medicaid Other | Attending: Internal Medicine | Admitting: Internal Medicine

## 2014-12-20 DIAGNOSIS — S2231XA Fracture of one rib, right side, initial encounter for closed fracture: Secondary | ICD-10-CM

## 2014-12-20 DIAGNOSIS — E440 Moderate protein-calorie malnutrition: Secondary | ICD-10-CM | POA: Diagnosis present

## 2014-12-20 DIAGNOSIS — I10 Essential (primary) hypertension: Secondary | ICD-10-CM | POA: Diagnosis present

## 2014-12-20 DIAGNOSIS — J9610 Chronic respiratory failure, unspecified whether with hypoxia or hypercapnia: Secondary | ICD-10-CM | POA: Diagnosis present

## 2014-12-20 DIAGNOSIS — Z6820 Body mass index (BMI) 20.0-20.9, adult: Secondary | ICD-10-CM | POA: Diagnosis not present

## 2014-12-20 DIAGNOSIS — S2231XD Fracture of one rib, right side, subsequent encounter for fracture with routine healing: Secondary | ICD-10-CM | POA: Diagnosis not present

## 2014-12-20 DIAGNOSIS — R079 Chest pain, unspecified: Secondary | ICD-10-CM | POA: Diagnosis present

## 2014-12-20 DIAGNOSIS — Z9981 Dependence on supplemental oxygen: Secondary | ICD-10-CM

## 2014-12-20 DIAGNOSIS — E785 Hyperlipidemia, unspecified: Secondary | ICD-10-CM | POA: Diagnosis present

## 2014-12-20 DIAGNOSIS — S2241XA Multiple fractures of ribs, right side, initial encounter for closed fracture: Secondary | ICD-10-CM | POA: Diagnosis present

## 2014-12-20 DIAGNOSIS — J930 Spontaneous tension pneumothorax: Principal | ICD-10-CM | POA: Diagnosis present

## 2014-12-20 DIAGNOSIS — F17210 Nicotine dependence, cigarettes, uncomplicated: Secondary | ICD-10-CM | POA: Diagnosis present

## 2014-12-20 DIAGNOSIS — J441 Chronic obstructive pulmonary disease with (acute) exacerbation: Secondary | ICD-10-CM | POA: Diagnosis not present

## 2014-12-20 DIAGNOSIS — Z7982 Long term (current) use of aspirin: Secondary | ICD-10-CM

## 2014-12-20 DIAGNOSIS — R55 Syncope and collapse: Secondary | ICD-10-CM

## 2014-12-20 DIAGNOSIS — J939 Pneumothorax, unspecified: Secondary | ICD-10-CM

## 2014-12-20 DIAGNOSIS — G8929 Other chronic pain: Secondary | ICD-10-CM | POA: Diagnosis present

## 2014-12-20 DIAGNOSIS — W19XXXA Unspecified fall, initial encounter: Secondary | ICD-10-CM

## 2014-12-20 DIAGNOSIS — Z79891 Long term (current) use of opiate analgesic: Secondary | ICD-10-CM

## 2014-12-20 DIAGNOSIS — S2249XA Multiple fractures of ribs, unspecified side, initial encounter for closed fracture: Secondary | ICD-10-CM

## 2014-12-20 DIAGNOSIS — Z833 Family history of diabetes mellitus: Secondary | ICD-10-CM | POA: Diagnosis not present

## 2014-12-20 DIAGNOSIS — J449 Chronic obstructive pulmonary disease, unspecified: Secondary | ICD-10-CM | POA: Diagnosis present

## 2014-12-20 DIAGNOSIS — Z9889 Other specified postprocedural states: Secondary | ICD-10-CM

## 2014-12-20 DIAGNOSIS — S2239XA Fracture of one rib, unspecified side, initial encounter for closed fracture: Secondary | ICD-10-CM

## 2014-12-20 DIAGNOSIS — J438 Other emphysema: Secondary | ICD-10-CM | POA: Diagnosis not present

## 2014-12-20 DIAGNOSIS — S2231XS Fracture of one rib, right side, sequela: Secondary | ICD-10-CM | POA: Diagnosis not present

## 2014-12-20 DIAGNOSIS — J9621 Acute and chronic respiratory failure with hypoxia: Secondary | ICD-10-CM | POA: Diagnosis not present

## 2014-12-20 LAB — COMPREHENSIVE METABOLIC PANEL
ALK PHOS: 71 U/L (ref 38–126)
ALT: 25 U/L (ref 17–63)
AST: 23 U/L (ref 15–41)
Albumin: 3.7 g/dL (ref 3.5–5.0)
Anion gap: 10 (ref 5–15)
BUN: 13 mg/dL (ref 6–20)
CALCIUM: 8.9 mg/dL (ref 8.9–10.3)
CO2: 29 mmol/L (ref 22–32)
CREATININE: 0.84 mg/dL (ref 0.61–1.24)
Chloride: 101 mmol/L (ref 101–111)
GFR calc Af Amer: >60 mL/min (ref >60)
GFR calc non Af Amer: >60 mL/min (ref >60)
GLUCOSE: 120 mg/dL — AB (ref 65–99)
POTASSIUM: 3.9 mmol/L (ref 3.5–5.1)
SODIUM: 140 mmol/L (ref 135–145)
Total Bilirubin: 0.6 mg/dL (ref 0.3–1.2)
Total Protein: 7.2 g/dL (ref 6.5–8.1)

## 2014-12-20 LAB — CBC WITH DIFFERENTIAL/PLATELET
BASOS ABS: 0 10*3/uL (ref 0.0–0.1)
Basophils Relative: 0 % (ref 0–1)
EOS ABS: 0 10*3/uL (ref 0.0–0.7)
Eosinophils Relative: 0 % (ref 0–5)
HEMATOCRIT: 40.2 % (ref 39.0–52.0)
HEMOGLOBIN: 13 g/dL (ref 13.0–17.0)
Lymphocytes Relative: 7 % — ABNORMAL LOW (ref 12–46)
Lymphs Abs: 1.3 10*3/uL (ref 0.7–4.0)
MCH: 26.6 pg (ref 26.0–34.0)
MCHC: 32.3 g/dL (ref 30.0–36.0)
MCV: 82.4 fL (ref 78.0–100.0)
MONOS PCT: 6 % (ref 3–12)
Monocytes Absolute: 1.1 10*3/uL — ABNORMAL HIGH (ref 0.1–1.0)
NEUTROS PCT: 87 % — AB (ref 43–77)
Neutro Abs: 14.8 10*3/uL — ABNORMAL HIGH (ref 1.7–7.7)
PLATELETS: 254 10*3/uL (ref 150–400)
RBC: 4.88 MIL/uL (ref 4.22–5.81)
RDW: 17.6 % — ABNORMAL HIGH (ref 11.5–15.5)
WBC: 17.2 10*3/uL — ABNORMAL HIGH (ref 4.0–10.5)

## 2014-12-20 LAB — BLOOD GAS, ARTERIAL
Acid-Base Excess: 2.3 mmol/L — ABNORMAL HIGH (ref 0.0–2.0)
BICARBONATE: 25.9 meq/L — AB (ref 20.0–24.0)
O2 Content: 6 L/min
O2 Saturation: 89.1 %
PH ART: 7.455 — AB (ref 7.350–7.450)
PO2 ART: 55.4 mmHg — AB (ref 80.0–100.0)
Patient temperature: 37
TCO2: 23 mmol/L (ref 0–100)
pCO2 arterial: 37.4 mmHg (ref 35.0–45.0)

## 2014-12-20 LAB — TROPONIN I: Troponin I: <0.03 ng/mL (ref <0.031)

## 2014-12-20 LAB — MRSA PCR SCREENING: MRSA BY PCR: NEGATIVE

## 2014-12-20 MED ORDER — HEPARIN SODIUM (PORCINE) 5000 UNIT/ML IJ SOLN
5000.0000 [IU] | Freq: Three times a day (TID) | INTRAMUSCULAR | Status: DC
  Administered 2014-12-20 – 2014-12-24 (×11): 5000 [IU] via SUBCUTANEOUS
  Filled 2014-12-20 (×10): qty 1

## 2014-12-20 MED ORDER — SODIUM CHLORIDE 0.9 % IJ SOLN
3.0000 mL | Freq: Two times a day (BID) | INTRAMUSCULAR | Status: DC
  Administered 2014-12-21 – 2014-12-23 (×2): 3 mL via INTRAVENOUS

## 2014-12-20 MED ORDER — MORPHINE SULFATE 4 MG/ML IJ SOLN
4.0000 mg | INTRAMUSCULAR | Status: DC | PRN
  Administered 2014-12-20 (×2): 4 mg via INTRAVENOUS
  Filled 2014-12-20 (×2): qty 1

## 2014-12-20 MED ORDER — LIDOCAINE HCL (PF) 2 % IJ SOLN
INTRAMUSCULAR | Status: AC
  Administered 2014-12-20: 16:00:00
  Filled 2014-12-20: qty 10

## 2014-12-20 MED ORDER — SIMVASTATIN 20 MG PO TABS
20.0000 mg | ORAL_TABLET | Freq: Every day | ORAL | Status: DC
  Administered 2014-12-20 – 2014-12-24 (×5): 20 mg via ORAL
  Filled 2014-12-20 (×5): qty 1

## 2014-12-20 MED ORDER — ONDANSETRON HCL 4 MG PO TABS: 4.0000 mg | ORAL_TABLET | Freq: Four times a day (QID) | ORAL | Status: DC | PRN

## 2014-12-20 MED ORDER — ENSURE ENLIVE PO LIQD
237.0000 mL | Freq: Two times a day (BID) | ORAL | Status: DC
  Administered 2014-12-21 – 2014-12-24 (×7): 237 mL via ORAL

## 2014-12-20 MED ORDER — ONDANSETRON HCL 4 MG PO TABS: 4.0000 mg | ORAL_TABLET | Freq: Three times a day (TID) | ORAL | Status: DC | PRN

## 2014-12-20 MED ORDER — METHYLPREDNISOLONE SODIUM SUCC 125 MG IJ SOLR
125.0000 mg | Freq: Once | INTRAMUSCULAR | Status: AC
  Administered 2014-12-20: 125 mg via INTRAVENOUS
  Filled 2014-12-20: qty 2

## 2014-12-20 MED ORDER — CETYLPYRIDINIUM CHLORIDE 0.05 % MT LIQD
7.0000 mL | Freq: Two times a day (BID) | OROMUCOSAL | Status: DC
  Administered 2014-12-20 – 2014-12-24 (×7): 7 mL via OROMUCOSAL

## 2014-12-20 MED ORDER — CYCLOBENZAPRINE HCL 10 MG PO TABS
10.0000 mg | ORAL_TABLET | Freq: Three times a day (TID) | ORAL | Status: DC | PRN
  Administered 2014-12-20: 10 mg via ORAL
  Filled 2014-12-20: qty 1

## 2014-12-20 MED ORDER — CALCIUM CARBONATE-VITAMIN D 500-200 MG-UNIT PO TABS
1.0000 | ORAL_TABLET | Freq: Two times a day (BID) | ORAL | Status: DC
  Administered 2014-12-20 – 2014-12-24 (×8): 1 via ORAL
  Filled 2014-12-20 (×13): qty 1

## 2014-12-20 MED ORDER — MIDAZOLAM HCL 2 MG/2ML IJ SOLN
2.0000 mg | Freq: Once | INTRAMUSCULAR | Status: DC
  Filled 2014-12-20: qty 2

## 2014-12-20 MED ORDER — ASPIRIN EC 81 MG PO TBEC
81.0000 mg | DELAYED_RELEASE_TABLET | Freq: Every day | ORAL | Status: DC
  Filled 2014-12-20: qty 1

## 2014-12-20 MED ORDER — ALBUTEROL SULFATE (2.5 MG/3ML) 0.083% IN NEBU: 2.5000 mg | INHALATION_SOLUTION | RESPIRATORY_TRACT | Status: DC | PRN

## 2014-12-20 MED ORDER — KETAMINE HCL 10 MG/ML IJ SOLN
INTRAMUSCULAR | Status: DC | PRN
  Administered 2014-12-20: 76.2 mg via INTRAVENOUS
  Administered 2014-12-20: 76 mg via INTRAVENOUS

## 2014-12-20 MED ORDER — DICLOFENAC SODIUM 75 MG PO TBEC
75.0000 mg | DELAYED_RELEASE_TABLET | Freq: Two times a day (BID) | ORAL | Status: DC
  Administered 2014-12-20 – 2014-12-24 (×8): 75 mg via ORAL
  Filled 2014-12-20 (×11): qty 1

## 2014-12-20 MED ORDER — DULOXETINE HCL 60 MG PO CPEP
60.0000 mg | ORAL_CAPSULE | Freq: Every day | ORAL | Status: DC
  Administered 2014-12-20 – 2014-12-24 (×5): 60 mg via ORAL
  Filled 2014-12-20 (×6): qty 1

## 2014-12-20 MED ORDER — OXYCODONE-ACETAMINOPHEN 10-325 MG PO TABS: 1.0000 | ORAL_TABLET | ORAL | Status: DC | PRN

## 2014-12-20 MED ORDER — PANTOPRAZOLE SODIUM 40 MG PO TBEC
40.0000 mg | DELAYED_RELEASE_TABLET | Freq: Every day | ORAL | Status: DC
  Administered 2014-12-21 – 2014-12-24 (×4): 40 mg via ORAL
  Filled 2014-12-20 (×4): qty 1

## 2014-12-20 MED ORDER — ONDANSETRON HCL 4 MG/2ML IJ SOLN: 4.0000 mg | Freq: Four times a day (QID) | INTRAMUSCULAR | Status: DC | PRN

## 2014-12-20 MED ORDER — ALPRAZOLAM 0.5 MG PO TABS
0.5000 mg | ORAL_TABLET | Freq: Every evening | ORAL | Status: DC | PRN
Start: 2014-12-20 — End: 2014-12-24
  Administered 2014-12-20 – 2014-12-23 (×2): 0.5 mg via ORAL
  Filled 2014-12-20 (×2): qty 1

## 2014-12-20 MED ORDER — SODIUM CHLORIDE 0.9 % IV SOLN
INTRAVENOUS | Status: DC
  Administered 2014-12-20 – 2014-12-24 (×4): via INTRAVENOUS

## 2014-12-20 MED ORDER — GABAPENTIN 300 MG PO CAPS
300.0000 mg | ORAL_CAPSULE | Freq: Three times a day (TID) | ORAL | Status: DC
  Administered 2014-12-20 – 2014-12-24 (×12): 300 mg via ORAL
  Filled 2014-12-20 (×13): qty 1

## 2014-12-20 MED ORDER — OXYCODONE-ACETAMINOPHEN 7.5-325 MG PO TABS
1.0000 | ORAL_TABLET | ORAL | Status: DC | PRN
  Administered 2014-12-20 – 2014-12-24 (×13): 1 via ORAL
  Filled 2014-12-20 (×13): qty 1

## 2014-12-20 MED ORDER — MIDAZOLAM HCL 2 MG/2ML IJ SOLN
INTRAMUSCULAR | Status: DC | PRN
  Administered 2014-12-20: 2 mg via INTRAVENOUS

## 2014-12-20 MED ORDER — KETAMINE HCL 10 MG/ML IJ SOLN
1.0000 mg/kg | Freq: Once | INTRAMUSCULAR | Status: DC
  Filled 2014-12-20: qty 1

## 2014-12-20 MED ORDER — LEVOFLOXACIN IN D5W 500 MG/100ML IV SOLN
500.0000 mg | INTRAVENOUS | Status: DC
  Administered 2014-12-20 – 2014-12-23 (×4): 500 mg via INTRAVENOUS
  Filled 2014-12-20 (×4): qty 100

## 2014-12-20 NOTE — ED Notes (Signed)
Pt breathing labored more than upon arrival,  EDP notified.

## 2014-12-20 NOTE — ED Notes (Signed)
Pt states that he passed out and fell 2 days ago and is c/o right rib pain.  States that he does not remember much about the fall.

## 2014-12-20 NOTE — ED Notes (Signed)
MD at bedside. 

## 2014-12-20 NOTE — H&P (Signed)
[facility] History and Physical  Sean Thomas YYT:[id number] DOB: Mar 26, 1961 DOA: 12/20/2014  Referring physician: ER, Dr. Waverly Ferrari PCP: Kerin Perna, NP   Chief Complaint: Dyspnea, right-sided chest pain  HPI: Sean Thomas is a 54 y.o. male  This is a 54 year old man who came to the emergency room with right-sided chest pain associated with dyspnea. He apparently had a fall 2 days ago and apparently had loss of consciousness but he cannot give me more details about this. He also has had generalized chronic pains from an accident 2 years ago and takes. Medications on a chronic basis. He has history of COPD and is oxygen dependent, he says he takes 3-4 L oxygen per minute. He denies head injury. There is no palpitations, new limb weakness, or arm pain. Evaluation in the emergency room showed this patient to have a right-sided tension pneumothorax. Chest tube was inserted promptly and he has had some relief. The chest x-ray postprocedure shows complete expansion of the lung on that side. He does have rib fractures also off the right lateral eighth to 10th ribs . There also multiple old left rib fracture seen. Postprocedure x-ray also is suggestive of infiltrates. He is now being admitted for further management.   Review of Systems:  Apart from symptoms above, all systems negative.  Past Medical History  Diagnosis Date  . COPD (chronic obstructive pulmonary disease)   . Hyperlipidemia   . Hypertension   . DDD (degenerative disc disease)   . Inguinal hernia 12/19/2013  . Fracture of occipital condyle 11/21/2012  . Fracture of multiple ribs 11/01/2012  . Cervical pain 11/21/2012  . Shortness of breath dyspnea   . Headache    Past Surgical History  Procedure Laterality Date  . Back surgery    . Lumbar fusion    . Inguinal hernia repair Left 07/24/2014    Procedure: LAPAROSCOPIC LEFT INGUINAL HERNIA REPAIR ;  Surgeon: Ralene Ok, MD;  Location: [place];  Service: General;   Laterality: Left;  . Insertion of mesh Left 07/24/2014    Procedure: INSERTION OF MESH;  Surgeon: Ralene Ok, MD;  Location: [place];  Service: General;  Laterality: Left;   Social History:  reports that he has been smoking Cigarettes.  He has a 37 pack-year smoking history. He has never used smokeless tobacco. He reports that he drinks alcohol. He reports that he does not use illicit drugs.  Allergies  Allergen Reactions  . Advil [Ibuprofen] Diarrhea  . Aleve [Naproxen Sodium] Nausea And Vomiting    diarrhea    Family History  Problem Relation Age of Onset  . Diabetes Mother     Prior to Admission medications   Medication Sig Start Date End Date Taking? Authorizing Provider  ANORO ELLIPTA 62.5-25 MCG/INH AEPB USE 1 PUFF BY MOUTH DAILY 07/30/14  Yes Collene Gobble, MD  aspirin EC 81 MG tablet Take 81 mg by mouth daily.   Yes Historical Provider, MD  calcium-vitamin D (OSCAL WITH D) 500-200 MG-UNIT per tablet Take 1 tablet by mouth 2 (two) times daily. VITAMIN D3 ORAL CAPSULE 1,000 UNIT TAKE 1 CAPSULE BY ORAL ROUTE DAILY   Yes Historical Provider, MD  cyclobenzaprine (FLEXERIL) 10 MG tablet Take 10 mg by mouth 3 (three) times daily as needed for muscle spasms.   Yes Historical Provider, MD  diclofenac (VOLTAREN) 75 MG EC tablet Take 75 mg by mouth 2 (two) times daily.   Yes Historical Provider, MD  DULoxetine (CYMBALTA) 60 MG capsule  Take 60 mg by mouth daily.   Yes Historical Provider, MD  gabapentin (NEURONTIN) 300 MG capsule Take 300 mg by mouth 3 (three) times daily.   Yes Historical Provider, MD  omeprazole (PRILOSEC) 20 MG capsule Take 20 mg by mouth daily.   Yes Historical Provider, MD  oxyCODONE-acetaminophen (PERCOCET) 10-325 MG per tablet Take 1 tablet by mouth every 4 (four) hours as needed for pain.   Yes Historical Provider, MD  simvastatin (ZOCOR) 40 MG tablet Take 20 mg by mouth daily.    Yes Historical Provider, MD  albuterol (PROVENTIL HFA;VENTOLIN HFA) 108 (90 BASE)  MCG/ACT inhaler Inhale into the lungs every 6 (six) hours as needed for wheezing or shortness of breath.    Historical Provider, MD  ALPRAZolam Duanne Moron) 0.5 MG tablet Take 0.5 mg by mouth at bedtime as needed for sleep.    Historical Provider, MD  ondansetron (ZOFRAN) 4 MG tablet Take 4 mg by mouth every 8 (eight) hours as needed for nausea or vomiting.    Historical Provider, MD  oxyCODONE-acetaminophen (PERCOCET) 7.5-325 MG per tablet Take 1 tablet by mouth every 4 (four) hours as needed for pain. Patient not taking: Reported on 12/20/2014 07/24/14   Ralene Ok, MD   Physical Exam: Filed Vitals:   12/20/14 1658 12/20/14 1700 12/20/14 1707 12/20/14 1715  BP:  159/90  161/75  Pulse:  88  88  Temp: 97.8 F (36.6 C)     TempSrc: Oral     Resp:  20  34  Height:   6\' 3"  (1.905 m)   Weight:   74.8 kg (164 lb 14.5 oz)   SpO2:  94%  95%    Wt Readings from Last 3 Encounters:  12/20/14 74.8 kg (164 lb 14.5 oz)  07/20/14 83.462 kg (184 lb)  05/19/14 84.46 kg (186 lb 3.2 oz)    General:  Appears to be in pain on the right chest, probably from a combination of the discomfort of the chest tube as well as right rib fractures. Eyes:  PERRL, normal lids, irises & conjunctiva ENT: grossly normal hearing, lips & tongue Neck: no LAD, masses or thyromegaly Cardiovascular: RRR, no m/r/g. No LE edema. Telemetry: SR, no arrhythmias  Respiratory: Air entry somewhat reduced bilaterally but equal. There are no crackles, there is some minimal wheezing but no bronchial breathing bilaterally. Right-sided chest tube is in place.  Abdomen: soft, ntnd Skin: no rash or induration seen on limited exam Musculoskeletal: grossly normal tone BUE/BLE Psychiatric: grossly normal mood and affect, speech fluent and appropriate Neurologic: grossly non-focal.          Labs on Admission:  Basic Metabolic Panel:  Recent Labs Lab 12/20/14 1301  NA 140  K 3.9  CL 101  CO2 29  GLUCOSE 120*  BUN 13  CREATININE  0.84  CALCIUM 8.9   Liver Function Tests:  Recent Labs Lab 12/20/14 1301  AST 23  ALT 25  ALKPHOS 71  BILITOT 0.6  PROT 7.2  ALBUMIN 3.7   No results for input(s): LIPASE, AMYLASE in the last 168 hours. No results for input(s): AMMONIA in the last 168 hours. CBC:  Recent Labs Lab 12/20/14 1301  WBC 17.2*  NEUTROABS 14.8*  HGB 13.0  HCT 40.2  MCV 82.4  PLT 254   Cardiac Enzymes:  Recent Labs Lab 12/20/14 1301  TROPONINI <0.03    BNP (last 3 results) No results for input(s): BNP in the last 8760 hours.  ProBNP (last 3 results) No  results for input(s): PROBNP in the last 8760 hours.  CBG: No results for input(s): GLUCAP in the last 168 hours.  Radiological Exams on Admission: Dg Ribs Unilateral W/chest Right  12/20/2014   CLINICAL DATA:  Syncope and fall 2 days ago. Right-sided chest and rib pain. COPD.  EXAM: RIGHT RIBS AND CHEST - 3+ VIEW  COMPARISON:  12/19/2013  FINDINGS: A large right pneumothorax is seen with shift of mediastinal structures to the left, consistent with a tension pneumothorax. Diffuse right lung volume loss and atelectasis is demonstrated. Left lung remains grossly clear. Heart size is within normal limits.  Acute mildly displaced fractures of the right lateral eighth, ninth, and tenth ribs are noted. Multiple old left rib fracture deformities are noted.  IMPRESSION: Large right tension pneumothorax, with mediastinal shift to the left.  Diffuse right lung volume loss and atelectasis.  Acute fractures of the right lateral eighth through tenth ribs. Multiple old left rib fracture deformities again noted.  Critical Value/emergent results were called by telephone at the time of interpretation on 12/20/2014 at 2:46 pm to Dr. Joseph Berkshire , who verbally acknowledged these results.   Electronically Signed   By: Earle Gell M.D.   On: 12/20/2014 14:46   Ct Head Wo Contrast  12/20/2014   CLINICAL DATA:  Fall 2 days ago hitting head on a bar stool. Loss  of consciousness. Right-sided body pain.  EXAM: CT HEAD WITHOUT CONTRAST  TECHNIQUE: Contiguous axial images were obtained from the base of the skull through the vertex without intravenous contrast.  COMPARISON:  None.  FINDINGS: No acute infarct, hemorrhage, or mass lesion is present. No significant extraaxial fluid collection is present. The ventricles are of normal size.  Fluid is present in the left maxillary sinus. There is no associated fracture. The remaining paranasal sinuses and mastoid air cells are clear. The calvarium is intact. No significant extracranial soft tissue injury is evident. The globes and orbits are intact.  IMPRESSION: 1. Normal CT appearance of the brain. 2. Fluid in the left maxillary sinus. This likely represents acute sinusitis. Correlate for tenderness related to trauma. No acute fracture is visualized.   Electronically Signed   By: San Morelle M.D.   On: 12/20/2014 15:00   Dg Chest Port 1 View  12/20/2014   CLINICAL DATA:  Right-sided pneumothorax.  Chest tube placement.  EXAM: PORTABLE CHEST - 1 VIEW  COMPARISON:  Chest and right rib radiographs from the same day.  FINDINGS: A right-sided chest tube has been placed. The pneumothorax is resolved. Midline shift is resolved.  Bilateral airspace disease is present. The heart is enlarged. Mild edema is present. Remote left-sided rib fractures are again noted. The acute right-sided rib fractures are less well seen in this projection.  IMPRESSION: 1. Resolution of pneumothorax following placement of chest tube. 2. Midline shift is also resolved. 3. Bilateral airspace opacities and mild edema.   Electronically Signed   By: San Morelle M.D.   On: 12/20/2014 15:34      Assessment/Plan  1. Right-sided pneumothorax, status post chest tube. We will ask surgery to follow the patient in consultation. Repeat chest x-ray tomorrow morning. Chest x-rays implying some infiltrates. His white blood cell count is elevated, but he  does not have a fever. I will treat him empirically with antibodies. He is somewhat wheezing and we will give him one dose of intravenous Solu-Medrol. 2. COPD. Appears to be stable. 3. Right-sided rib fractures. He will need pain control. 4. Ongoing  tobacco abuse. 5. Ongoing chronic narcotic use for chronic pain.   Further recommendations will depend on patient's hospital progress.   Code Status: full code.  DVT Prophylaxis: heparin.   Family Communication: I discussed the plan with the patient at the bedside.    Disposition Plan: home when medically stable.    Time spent: 60 minutes.   Doree Albee [facility] Pager [phone].

## 2014-12-20 NOTE — ED Provider Notes (Addendum)
CSN: [id number]     Arrival date & time 12/20/14  1228 History  This chart was scribed for Sean Greek, MD by Hansel Feinstein, ED Scribe. This patient was seen in room APA10/APA10 and the patient's care was started at 12:46 PM.     Chief Complaint  Patient presents with  . Fall  . Rib Injury   The history is provided by the patient. No language interpreter was used.   HPI Comments: Sean Thomas is a 54 y.o. male who presents to the Emergency Department complaining of constant, moderate right sided costal pain that occurred 2 days ago as a result of a fall. He reports LOC. Pt also notes generalized chronic pains from an accident 2 years ago. He denies head injury. He also denies numbness, arm pain and leg pain.    Past Medical History  Diagnosis Date  . COPD (chronic obstructive pulmonary disease)   . Hyperlipidemia   . Hypertension   . DDD (degenerative disc disease)   . Inguinal hernia 12/19/2013  . Fracture of occipital condyle 11/21/2012  . Fracture of multiple ribs 11/01/2012  . Cervical pain 11/21/2012  . Shortness of breath dyspnea   . Headache    Past Surgical History  Procedure Laterality Date  . Back surgery    . Lumbar fusion    . Inguinal hernia repair Left 07/24/2014    Procedure: LAPAROSCOPIC LEFT INGUINAL HERNIA REPAIR ;  Surgeon: Ralene Ok, MD;  Location: [facility];  Service: General;  Laterality: Left;  . Insertion of mesh Left 07/24/2014    Procedure: INSERTION OF MESH;  Surgeon: Ralene Ok, MD;  Location: [facility];  Service: General;  Laterality: Left;   Family History  Problem Relation Age of Onset  . Diabetes Mother    History  Substance Use Topics  . Smoking status: Current Every Day Smoker -- 1.00 packs/day for 37 years    Types: Cigarettes  . Smokeless tobacco: Never Used  . Alcohol Use: Yes     Comment: occasionally    Review of Systems  Genitourinary: Positive for flank pain ( right-sided costal pain).  Musculoskeletal: Positive for  arthralgias.  Neurological: Negative for numbness.  All other systems reviewed and are negative.   Allergies  Advil and Aleve  Home Medications   Prior to Admission medications   Medication Sig Start Date End Date Taking? Authorizing Provider  albuterol (PROVENTIL HFA;VENTOLIN HFA) 108 (90 BASE) MCG/ACT inhaler Inhale into the lungs every 6 (six) hours as needed for wheezing or shortness of breath.    Historical Provider, MD  ALPRAZolam Duanne Moron) 0.5 MG tablet Take 0.5 mg by mouth at bedtime as needed for sleep.    Historical Provider, MD  Jearl Klinefelter ELLIPTA 62.5-25 MCG/INH AEPB USE 1 PUFF BY MOUTH DAILY 07/30/14   Collene Gobble, MD  aspirin EC 81 MG tablet Take 81 mg by mouth daily.    Historical Provider, MD  calcium-vitamin D (OSCAL WITH D) 500-200 MG-UNIT per tablet Take 1 tablet by mouth 2 (two) times daily. VITAMIN D3 ORAL CAPSULE 1,000 UNIT TAKE 1 CAPSULE BY ORAL ROUTE DAILY    Historical Provider, MD  cyclobenzaprine (FLEXERIL) 10 MG tablet Take 10 mg by mouth 3 (three) times daily as needed for muscle spasms.    Historical Provider, MD  diclofenac (VOLTAREN) 75 MG EC tablet Take 75 mg by mouth 2 (two) times daily.    Historical Provider, MD  DULoxetine (CYMBALTA) 60 MG capsule Take 60 mg by mouth daily.  Historical Provider, MD  gabapentin (NEURONTIN) 300 MG capsule Take 300 mg by mouth 3 (three) times daily.    Historical Provider, MD  omeprazole (PRILOSEC) 20 MG capsule Take 20 mg by mouth daily.    Historical Provider, MD  ondansetron (ZOFRAN) 4 MG tablet Take 4 mg by mouth every 8 (eight) hours as needed for nausea or vomiting.    Historical Provider, MD  oxyCODONE-acetaminophen (PERCOCET) 10-325 MG per tablet Take 1 tablet by mouth every 4 (four) hours as needed for pain.    Historical Provider, MD  oxyCODONE-acetaminophen (PERCOCET) 7.5-325 MG per tablet Take 1 tablet by mouth every 4 (four) hours as needed for pain. Patient not taking: Reported on 12/20/2014 07/24/14   Ralene Ok, MD  OXYGEN-HELIUM IN Inhale into the lungs. 2 LITERS PER MIN. SET AT LEVEL 2    Historical Provider, MD  simvastatin (ZOCOR) 40 MG tablet Take 20 mg by mouth daily.     Historical Provider, MD   BP 137/106 mmHg  Pulse 82  Temp(Src) 98.9 F (37.2 C) (Oral)  Resp 24  Ht 6\' 3"  (1.905 m)  Wt 168 lb (76.204 kg)  BMI 21.00 kg/m2  SpO2 89% Physical Exam  Constitutional: He is oriented to person, place, and time. He appears well-developed and well-nourished. No distress.  HENT:  Head: Normocephalic and atraumatic.  Right Ear: Hearing normal.  Left Ear: Hearing normal.  Nose: Nose normal.  Mouth/Throat: Oropharynx is clear and moist and mucous membranes are normal.  Eyes: Conjunctivae and EOM are normal. Pupils are equal, round, and reactive to light.  Neck: Normal range of motion. Neck supple.  Cardiovascular: Normal rate, regular rhythm, S1 normal, S2 normal and normal heart sounds.  Exam reveals no gallop and no friction rub.   No murmur heard. Pulmonary/Chest: Effort normal. Tachypnea noted. No respiratory distress. He has decreased breath sounds in the right upper field, the right middle field and the right lower field. He has rales. He exhibits no tenderness.  Abdominal: Soft. Normal appearance and bowel sounds are normal. There is no hepatosplenomegaly. There is no tenderness. There is no rebound, no guarding, no tenderness at McBurney's point and negative Murphy's sign. No hernia.  Musculoskeletal: Normal range of motion.  Neurological: He is alert and oriented to person, place, and time. He has normal strength. No cranial nerve deficit or sensory deficit. Coordination normal. GCS eye subscore is 4. GCS verbal subscore is 5. GCS motor subscore is 6.  Skin: Skin is warm, dry and intact. No rash noted. No cyanosis.  Psychiatric: He has a normal mood and affect. His speech is normal and behavior is normal. Thought content normal.  Nursing note and vitals reviewed.   ED Course   CHEST TUBE INSERTION Date/Time: 12/20/2014 3:21 PM Performed by: Sean Thomas Authorized by: Sean Thomas Consent: Verbal consent obtained. Written consent obtained. Risks and benefits: risks, benefits and alternatives were discussed Consent given by: patient Patient understanding: patient states understanding of the procedure being performed Patient consent: the patient's understanding of the procedure matches consent given Procedure consent: procedure consent matches procedure scheduled Relevant documents: relevant documents present and verified Test results: test results available and properly labeled Site marked: the operative site was marked Imaging studies: imaging studies available Required items: required blood products, implants, devices, and special equipment available Patient identity confirmed: verbally with patient, hospital-assigned identification number and arm band Time out: Immediately prior to procedure a "time out" was called to verify the correct patient, procedure,  equipment, support staff and site/side marked as required. Indications: pneumothorax Patient sedated: yes Sedatives: ketamine, midazolam and see MAR for details Sedation start date/time: 12/20/2014 2:54 PM Sedation end date/time: 12/20/2014 3:22 PM Vitals: Vital signs were monitored during sedation. Anesthesia: local infiltration Local anesthetic: lidocaine 2% without epinephrine Anesthetic total: 6 ml Preparation: skin prepped with Betadine Placement location: right lateral Scalpel size: 15 Tube size: 24 [place] Dissection instrument: Kelly clamp Ultrasound guidance: no Tension pneumothorax heard: yes Tube connected to: water seal Drainage characteristics: bloody Drainage amount: 10 ml Suture material: 0 prolene. Dressing: petrolatum-impregnated gauze and 4x4 sterile gauze Post-insertion x-ray findings: tube in good position Patient tolerance: Patient tolerated the procedure well  with no immediate complications   (including critical care time)  Procedure: Ultrasound guided ABG Sterile prep. Good ulnar flow confirmed. Radial artery visually identified on ultrasound. ABG collected. Pressure held after procedure. Tolerated well, no complications.  DIAGNOSTIC STUDIES: Oxygen Saturation is 89% on 2 L/min O2 by nasal cannula, low by my interpretation.    COORDINATION OF CARE: 12:52 PM Discussed treatment plan with pt at bedside and pt agreed to plan.   Labs Review Labs Reviewed  CBC WITH DIFFERENTIAL/PLATELET - Abnormal; Notable for the following:    WBC 17.2 (*)    RDW 17.6 (*)    Neutrophils Relative % 87 (*)    Neutro Abs 14.8 (*)    Lymphocytes Relative 7 (*)    Monocytes Absolute 1.1 (*)    All other components within normal limits  COMPREHENSIVE METABOLIC PANEL - Abnormal; Notable for the following:    Glucose, Bld 120 (*)    All other components within normal limits  TROPONIN I    Imaging Review Dg Ribs Unilateral W/chest Right  12/20/2014   CLINICAL DATA:  Syncope and fall 2 days ago. Right-sided chest and rib pain. COPD.  EXAM: RIGHT RIBS AND CHEST - 3+ VIEW  COMPARISON:  12/19/2013  FINDINGS: A large right pneumothorax is seen with shift of mediastinal structures to the left, consistent with a tension pneumothorax. Diffuse right lung volume loss and atelectasis is demonstrated. Left lung remains grossly clear. Heart size is within normal limits.  Acute mildly displaced fractures of the right lateral eighth, ninth, and tenth ribs are noted. Multiple old left rib fracture deformities are noted.  IMPRESSION: Large right tension pneumothorax, with mediastinal shift to the left.  Diffuse right lung volume loss and atelectasis.  Acute fractures of the right lateral eighth through tenth ribs. Multiple old left rib fracture deformities again noted.  Critical Value/emergent results were called by telephone at the time of interpretation on 12/20/2014 at 2:46 pm to Dr.  Joseph Berkshire , who verbally acknowledged these results.   Electronically Signed   By: Earle Gell M.D.   On: 12/20/2014 14:46   Ct Head Wo Contrast  12/20/2014   CLINICAL DATA:  Fall 2 days ago hitting head on a bar stool. Loss of consciousness. Right-sided body pain.  EXAM: CT HEAD WITHOUT CONTRAST  TECHNIQUE: Contiguous axial images were obtained from the base of the skull through the vertex without intravenous contrast.  COMPARISON:  None.  FINDINGS: No acute infarct, hemorrhage, or mass lesion is present. No significant extraaxial fluid collection is present. The ventricles are of normal size.  Fluid is present in the left maxillary sinus. There is no associated fracture. The remaining paranasal sinuses and mastoid air cells are clear. The calvarium is intact. No significant extracranial soft tissue injury is evident. The globes and  orbits are intact.  IMPRESSION: 1. Normal CT appearance of the brain. 2. Fluid in the left maxillary sinus. This likely represents acute sinusitis. Correlate for tenderness related to trauma. No acute fracture is visualized.   Electronically Signed   By: San Morelle M.D.   On: 12/20/2014 15:00     EKG Interpretation   Date/Time:  Sunday December 20 2014 13:02:08 EDT Ventricular Rate:  87 PR Interval:  142 QRS Duration: 89 QT Interval:  358 QTC Calculation: 431 R Axis:   21 Text Interpretation:  Sinus rhythm Normal ECG Confirmed by POLLINA  MD,  CHRISTOPHER [phone]) on 12/20/2014 1:19:55 PM     MDM   Final diagnoses:  Fall  Pneumothorax on right  Syncope, unspecified syncope type  Rib fractures, right, closed, initial encounter    Patient presented to the ER for evaluation of right-sided rib pain and difficulty breathing. Patient has a history of COPD. He reports that he thinks he passed out 2 days ago. He reports falling to the right and injuring his right ribs. Since that time he has been having persistent pain in the ribs, but also reports  increasing shortness of breath.  Examination revealed decreased breath sounds on the right side. Pneumothorax was considered, but in this COPD patient, pneumonia was also considered. X-ray was performed and did show right-sided pneumothorax. There are some minor tension features with shift of the mediastinum, however, patient was never hypotensive or tachycardic. Oxygen saturations were around 90%. Arrangements were made to sedate the patient and a chest tube was placed. Oxygenation is improving and his respiratory rate is increasing. Repeat x-ray shows reinflation of the lung with appropriate placement of the tube.  Causes of the patient's syncope 2 days ago is unclear. Cardiac evaluation today is unremarkable. CT head was unremarkable. No neck or back pain to indicate injury or need for imaging.  Case was discussed briefly with Dr. Roxy Manns, on-call for cardiothoracic surgery at [facility]. He felt that since the patient's pneumothorax had resolved with chest tube placement, that the patient could be managed here at [facility]. He reports that she can be reconsult if there is a persistent air leak.  Case discussed briefly with Dr. Arnoldo Morale, general surgery. He will follow chest tube and pneumothorax resolution. Patient to be admitted by the hospitalist service, discussed with Dr. Anastasio Champion.  CRITICAL CARE Performed by: Sean Thomas   Total critical care time: 23min  Critical care time was exclusive of separately billable procedures and treating other patients.  Critical care was necessary to treat or prevent imminent or life-threatening deterioration.  Critical care was time spent personally by me on the following activities: development of treatment plan with patient and/or surrogate as well as nursing, discussions with consultants, evaluation of patient's response to treatment, examination of patient, obtaining history from patient or surrogate, ordering and performing treatments and  interventions, ordering and review of laboratory studies, ordering and review of radiographic studies, pulse oximetry and re-evaluation of patient's condition.    I personally performed the services described in this documentation, which was scribed in my presence. The recorded information has been reviewed and is accurate.    Sean Greek, MD 12/20/14 [place], MD 12/20/14 [phone]

## 2014-12-20 NOTE — Progress Notes (Signed)
Pt transferred to floor from ED. Pt is complaining of generalized pain. Chest tube and dressing are clean, dry, and intact. Tube is to -20 of suction and bright red drainage in tube is noted. Pt remains on 6L of O2 [place] and is alert, oriented, and agitated. He keeps asking to "be knocked out" and for something to drink. Admitting doctor is aware that pt is on floor. VS are HR 88, BP 161/75, SPO2 93% on 6L [address], RR 38 (pt breathing rapid shallow breaths).

## 2014-12-21 ENCOUNTER — Inpatient Hospital Stay (HOSPITAL_COMMUNITY): Payer: Medicaid Other

## 2014-12-21 DIAGNOSIS — J449 Chronic obstructive pulmonary disease, unspecified: Secondary | ICD-10-CM

## 2014-12-21 DIAGNOSIS — J9621 Acute and chronic respiratory failure with hypoxia: Secondary | ICD-10-CM

## 2014-12-21 DIAGNOSIS — J939 Pneumothorax, unspecified: Secondary | ICD-10-CM

## 2014-12-21 DIAGNOSIS — E440 Moderate protein-calorie malnutrition: Secondary | ICD-10-CM | POA: Insufficient documentation

## 2014-12-21 DIAGNOSIS — S2231XD Fracture of one rib, right side, subsequent encounter for fracture with routine healing: Secondary | ICD-10-CM

## 2014-12-21 LAB — CBC
HCT: 38.4 % — ABNORMAL LOW (ref 39.0–52.0)
Hemoglobin: 12.1 g/dL — ABNORMAL LOW (ref 13.0–17.0)
MCH: 26.2 pg (ref 26.0–34.0)
MCHC: 31.5 g/dL (ref 30.0–36.0)
MCV: 83.3 fL (ref 78.0–100.0)
Platelets: 251 10*3/uL (ref 150–400)
RBC: 4.61 MIL/uL (ref 4.22–5.81)
RDW: 17.6 % — AB (ref 11.5–15.5)
WBC: 18.3 10*3/uL — ABNORMAL HIGH (ref 4.0–10.5)

## 2014-12-21 LAB — COMPREHENSIVE METABOLIC PANEL
ALBUMIN: 3 g/dL — AB (ref 3.5–5.0)
ALT: 18 U/L (ref 17–63)
ANION GAP: 8 (ref 5–15)
AST: 18 U/L (ref 15–41)
Alkaline Phosphatase: 56 U/L (ref 38–126)
BUN: 15 mg/dL (ref 6–20)
CO2: 29 mmol/L (ref 22–32)
Calcium: 8.5 mg/dL — ABNORMAL LOW (ref 8.9–10.3)
Chloride: 102 mmol/L (ref 101–111)
Creatinine, Ser: 0.72 mg/dL (ref 0.61–1.24)
GFR calc Af Amer: >60 mL/min (ref >60)
Glucose, Bld: 160 mg/dL — ABNORMAL HIGH (ref 65–99)
POTASSIUM: 4.1 mmol/L (ref 3.5–5.1)
SODIUM: 139 mmol/L (ref 135–145)
TOTAL PROTEIN: 6.3 g/dL — AB (ref 6.5–8.1)
Total Bilirubin: 0.6 mg/dL (ref 0.3–1.2)

## 2014-12-21 MED ORDER — IPRATROPIUM-ALBUTEROL 0.5-2.5 (3) MG/3ML IN SOLN
3.0000 mL | RESPIRATORY_TRACT | Status: DC
  Administered 2014-12-21 – 2014-12-22 (×5): 3 mL via RESPIRATORY_TRACT
  Filled 2014-12-21 (×6): qty 3

## 2014-12-21 MED ORDER — HYDROMORPHONE HCL 1 MG/ML IJ SOLN
1.0000 mg | INTRAMUSCULAR | Status: DC | PRN
  Administered 2014-12-21 – 2014-12-22 (×5): 1 mg via INTRAVENOUS
  Filled 2014-12-21 (×5): qty 1

## 2014-12-21 MED ORDER — NICOTINE 21 MG/24HR TD PT24
21.0000 mg | MEDICATED_PATCH | Freq: Every day | TRANSDERMAL | Status: DC
  Administered 2014-12-21 – 2014-12-24 (×4): 21 mg via TRANSDERMAL
  Filled 2014-12-21 (×4): qty 1

## 2014-12-21 NOTE — Progress Notes (Signed)
Initial Nutrition Assessment  DOCUMENTATION CODES:  Non-severe (moderate) malnutrition in context of chronic illness  INTERVENTION:  Ensure Enlive (each supplement provides 350kcal and 20 grams of protein)  NUTRITION DIAGNOSIS:  Inadequate oral intake related to catabolic illness as evidenced by estimated needs, mild depletion of body fat, mild depletion of muscle mass.   GOAL:  Patient will meet greater than or equal to 90% of their needs, Weight gain   MONITOR:  PO intake, Supplement acceptance, Weight trends  REASON FOR ASSESSMENT:  Malnutrition Screening Tool    ASSESSMENT: Pt is s/p fall (rib fx) and has a right-sided pneumothorax. Chronic respiratory failure and is a smoker. His weight has decreased since January by (9%). Pt says he's been "worrying" a lot. Today he has eaten well (75%) of meals. He drinks Ensure and was encouraged to utilize opportunity for snacks as desired. His primary beverage of choice is [place]. Nutrition focused physcial exam: He has areas of muscle depletions and loss of fat mass.  Height:  Ht Readings from Last 1 Encounters:  12/20/14 6\' 3"  (1.905 m)    Weight:  Wt Readings from Last 1 Encounters:  12/21/14 167 lb 5.3 oz (75.9 kg)    Ideal Body Weight:  89 kg  Wt Readings from Last 10 Encounters:  12/21/14 167 lb 5.3 oz (75.9 kg)  07/20/14 184 lb (83.462 kg)  05/19/14 186 lb 3.2 oz (84.46 kg)  02/05/14 170 lb (77.111 kg)  01/28/14 182 lb (82.555 kg)  01/21/14 183 lb 6.4 oz (83.19 kg)  12/19/13 184 lb 6.4 oz (83.643 kg)  12/11/13 182 lb (82.555 kg)  10/09/11 180 lb (81.647 kg)  04/23/11 185 lb (83.915 kg)    BMI:  Body mass index is 20.91 kg/(m^2). normal range  Estimated Nutritional Needs:  Kcal:  2280  Protein:  99 gr  Fluid:  2.3 liters daily  Skin:    WDL  Diet Order:  Diet regular Room service appropriate?: Yes; Fluid consistency:: Thin  EDUCATION NEEDS:  Education needs addressed   Intake/Output  Summary (Last 24 hours) at 12/21/14 1702 Last data filed at 12/21/14 1239  Gross per 24 hour  Intake 1069.17 ml  Output    470 ml  Net 599.17 ml    Last BM:  6/3  Colman Cater MS,RD,CSG,LDN Office: [phone] Pager: [phone]

## 2014-12-21 NOTE — Consult Note (Signed)
Subjective: Patient states he still has right-sided chest pain and morphine is only mildly helpful. He is not short of breath.  Objective: Vital signs in last 24 hours: Temp:  [97.7 F (36.5 C)-98.9 F (37.2 C)] 98.5 F (36.9 C) (06/06 0859) Pulse Rate:  [56-96] 59 (06/06 0800) Resp:  [17-50] 17 (06/06 0800) BP: (105-162)/(66-112) 113/70 mmHg (06/06 0800) SpO2:  [84 %-99 %] 95 % (06/06 0800) FiO2 (%):  [36 %] 36 % (06/05 2000) Weight:  [74.8 kg (164 lb 14.5 oz)-76.204 kg (168 lb)] 75.9 kg (167 lb 5.3 oz) (06/06 0500) Last BM Date: 12/18/14  Intake/Output from previous day: 06/05 0701 - 06/06 0700 In: 479.2 [I.V.:379.2; IV Piggyback:100] Out: 510 [Urine:450; Chest Tube:60] Intake/Output this shift:    General appearance: alert, cooperative and no distress Resp: Poor inspiratory effort due to splinting. Chest tube in adequate position and no air leak is noted.  Lab Results:   Recent Labs  12/20/14 1301 12/21/14 0434  WBC 17.2* 18.3*  HGB 13.0 12.1*  HCT 40.2 38.4*  PLT 254 251   BMET  Recent Labs  12/20/14 1301 12/21/14 0434  NA 140 139  K 3.9 4.1  CL 101 102  CO2 29 29  GLUCOSE 120* 160*  BUN 13 15  CREATININE 0.84 0.72  CALCIUM 8.9 8.5*   PT/INR No results for input(s): LABPROT, INR in the last 72 hours.  Studies/Results: Dg Ribs Unilateral W/chest Right  12/20/2014   CLINICAL DATA:  Syncope and fall 2 days ago. Right-sided chest and rib pain. COPD.  EXAM: RIGHT RIBS AND CHEST - 3+ VIEW  COMPARISON:  12/19/2013  FINDINGS: A large right pneumothorax is seen with shift of mediastinal structures to the left, consistent with a tension pneumothorax. Diffuse right lung volume loss and atelectasis is demonstrated. Left lung remains grossly clear. Heart size is within normal limits.  Acute mildly displaced fractures of the right lateral eighth, ninth, and tenth ribs are noted. Multiple old left rib fracture deformities are noted.  IMPRESSION: Large right tension  pneumothorax, with mediastinal shift to the left.  Diffuse right lung volume loss and atelectasis.  Acute fractures of the right lateral eighth through tenth ribs. Multiple old left rib fracture deformities again noted.  Critical Value/emergent results were called by telephone at the time of interpretation on 12/20/2014 at 2:46 pm to Dr. Joseph Berkshire , who verbally acknowledged these results.   Electronically Signed   By: Earle Gell M.D.   On: 12/20/2014 14:46   Ct Head Wo Contrast  12/20/2014   CLINICAL DATA:  Fall 2 days ago hitting head on a bar stool. Loss of consciousness. Right-sided body pain.  EXAM: CT HEAD WITHOUT CONTRAST  TECHNIQUE: Contiguous axial images were obtained from the base of the skull through the vertex without intravenous contrast.  COMPARISON:  None.  FINDINGS: No acute infarct, hemorrhage, or mass lesion is present. No significant extraaxial fluid collection is present. The ventricles are of normal size.  Fluid is present in the left maxillary sinus. There is no associated fracture. The remaining paranasal sinuses and mastoid air cells are clear. The calvarium is intact. No significant extracranial soft tissue injury is evident. The globes and orbits are intact.  IMPRESSION: 1. Normal CT appearance of the brain. 2. Fluid in the left maxillary sinus. This likely represents acute sinusitis. Correlate for tenderness related to trauma. No acute fracture is visualized.   Electronically Signed   By: San Morelle M.D.   On: 12/20/2014 15:00  Dg Chest Port 1 View  12/21/2014   CLINICAL DATA:  Multiple rib fractures.  EXAM: PORTABLE CHEST - 1 VIEW  COMPARISON:  12/20/2014.  12/19/2013.  FINDINGS: Right chest tube in stable position. Mediastinum and hilar structures are unremarkable. Bibasilar atelectasis and/or infiltrates again noted. New platelike area of atelectasis left lower lobe. No pleural effusion or pneumothorax. Stable cardiomegaly. Right chest wall mild subcutaneous  emphysema. Multiple old left displaced rib fractures again noted. Acute nondisplaced right eighth, ninth, and tenth rib fractures again noted.  IMPRESSION: 1. Right chest tube in stable position . No pneumothorax . Nondisplaced acute right lower rib fractures again noted. Old left rib fractures again noted. 2. Bibasilar atelectasis. 3. Stable cardiomegaly.   Electronically Signed   By: Marcello Moores  Register   On: 12/21/2014 07:43   Dg Chest Port 1 View  12/20/2014   CLINICAL DATA:  Right-sided pneumothorax.  Chest tube placement.  EXAM: PORTABLE CHEST - 1 VIEW  COMPARISON:  Chest and right rib radiographs from the same day.  FINDINGS: A right-sided chest tube has been placed. The pneumothorax is resolved. Midline shift is resolved.  Bilateral airspace disease is present. The heart is enlarged. Mild edema is present. Remote left-sided rib fractures are again noted. The acute right-sided rib fractures are less well seen in this projection.  IMPRESSION: 1. Resolution of pneumothorax following placement of chest tube. 2. Midline shift is also resolved. 3. Bilateral airspace opacities and mild edema.   Electronically Signed   By: San Morelle M.D.   On: 12/20/2014 15:34    Anti-infectives: Anti-infectives    Start     Dose/Rate Route Frequency Ordered Stop   12/20/14 1830  levofloxacin (LEVAQUIN) IVPB 500 mg     500 mg 100 mL/hr over 60 Minutes Intravenous Every 24 hours 12/20/14 1730        Assessment/Plan: Impression: Status post chest tube placement in the emergency room for right pneumothorax secondary to a fall with broken ribs. Patient also is on home O2 with significant COPD. Plan: Continue current management. I have added Dilaudid for pain control. A repeat chest x-ray will be performed in a.m. Will follow with you.  LOS: 1 day    Ad Guttman A 12/21/2014

## 2014-12-21 NOTE — Care Management Note (Signed)
Case Management Note  Patient Details  Name: Sean Thomas MRN: [id number] Date of Birth: Dec 20, 1960  Expected Discharge Date:                  Expected Discharge Plan:  Home/Self Care  In-House Referral:  NA  Discharge planning Services  CM Consult  Post Acute Care Choice:  NA Choice offered to:  NA  DME Arranged:    DME Agency:     HH Arranged:    [place] Agency:     Status of Service:  In process, will continue to follow  Medicare Important Message Given:    Date Medicare IM Given:    Medicare IM give by:    Date Additional Medicare IM Given:    Additional Medicare Important Message give by:     If discussed at [place] of Stay Meetings, dates discussed:    Additional Comments: Pt is from home, lives alone but recieves assistance from sister [name]) when needed. Pt uses a cane PRN. Pt has home O2 with port tank but says the company told him his insurance wouldn't pay for it any longer. Pt unsure what O2 company it is from, says his sister got it for him. Pt has neb machine at home. Pt plans to return home at discharge. Pt has pending disability claim, financial counselor referred. CM left message for [name] (sister) to discuss oxygen. Will cont to follow for CM needs.  Sherald Barge, RN 12/21/2014, 11:30 AM

## 2014-12-21 NOTE — Progress Notes (Signed)
[facility] PROGRESS NOTE  Sean Thomas JGO:[id number] DOB: [birth year] DOA: 12/20/2014 PCP: Kerin Perna, NP  Assessment/Plan: 1. Right-sided pneumothorax. Status post chest tube. Likely related to fall and associated rib fractures. General surgery following for chest tube management. 2. COPD. No wheezing present at this time. Continue bronchodilators. 3. Chronic respiratory failure on home oxygen. Appears to be stable. Continue current treatments. 4. Fall. I suspect this is related to polypharmacy and narcotic use. We'll need to reassess pain medication regimen on discharge. 5. Right-sided rib fractures. Continue pain management. 6. Ongoing tobacco use   Code Status: full code Family Communication: discussed with patient  Disposition Plan: discharge home once improved   Consultants:  General surgery  Procedures:  Chest tube placement 6/5 in ED  Antibiotics:  Levofloxacin 6/5>>  HPI/Subjective: Complains of pain in his ribs, no shortness of breath or wheezing. He was coughing this morning  Objective: Filed Vitals:   12/21/14 1000  BP: 119/61  Pulse:   Temp:   Resp: 20    Intake/Output Summary (Last 24 hours) at 12/21/14 1035 Last data filed at 12/21/14 0900  Gross per 24 hour  Intake 829.17 ml  Output    510 ml  Net 319.17 ml   Filed Weights   12/20/14 1231 12/20/14 1707 12/21/14 0500  Weight: 76.204 kg (168 lb) 74.8 kg (164 lb 14.5 oz) 75.9 kg (167 lb 5.3 oz)    Exam:   General:  NAD  Cardiovascular: s1, s2, rrr  Respiratory: cta b, crackles at left base  Abdomen: soft, nt, nd, bs+  Musculoskeletal:  No edema b/l   Data Reviewed: Basic Metabolic Panel:  Recent Labs Lab 12/20/14 1301 12/21/14 0434  NA 140 139  K 3.9 4.1  CL 101 102  CO2 29 29  GLUCOSE 120* 160*  BUN 13 15  CREATININE 0.84 0.72  CALCIUM 8.9 8.5*   Liver Function Tests:  Recent Labs Lab 12/20/14 1301 12/21/14 0434  AST 23 18  ALT 25 18  ALKPHOS 71  56  BILITOT 0.6 0.6  PROT 7.2 6.3*  ALBUMIN 3.7 3.0*   No results for input(s): LIPASE, AMYLASE in the last 168 hours. No results for input(s): AMMONIA in the last 168 hours. CBC:  Recent Labs Lab 12/20/14 1301 12/21/14 0434  WBC 17.2* 18.3*  NEUTROABS 14.8*  --   HGB 13.0 12.1*  HCT 40.2 38.4*  MCV 82.4 83.3  PLT 254 251   Cardiac Enzymes:  Recent Labs Lab 12/20/14 1301  TROPONINI <0.03   BNP (last 3 results) No results for input(s): BNP in the last 8760 hours.  ProBNP (last 3 results) No results for input(s): PROBNP in the last 8760 hours.  CBG: No results for input(s): GLUCAP in the last 168 hours.  Recent Results (from the past 240 hour(s))  MRSA PCR Screening     Status: None   Collection Time: 12/20/14  5:00 PM  Result Value Ref Range Status   MRSA by PCR NEGATIVE NEGATIVE Final    Comment:        The GeneXpert MRSA Assay (FDA approved for NASAL specimens only), is one component of a comprehensive MRSA colonization surveillance program. It is not intended to diagnose MRSA infection nor to guide or monitor treatment for MRSA infections.      Studies: Dg Ribs Unilateral W/chest Right  12/20/2014   CLINICAL DATA:  Syncope and fall 2 days ago. Right-sided chest and rib pain. COPD.  EXAM: RIGHT RIBS AND CHEST -  3+ VIEW  COMPARISON:  12/19/2013  FINDINGS: A large right pneumothorax is seen with shift of mediastinal structures to the left, consistent with a tension pneumothorax. Diffuse right lung volume loss and atelectasis is demonstrated. Left lung remains grossly clear. Heart size is within normal limits.  Acute mildly displaced fractures of the right lateral eighth, ninth, and tenth ribs are noted. Multiple old left rib fracture deformities are noted.  IMPRESSION: Large right tension pneumothorax, with mediastinal shift to the left.  Diffuse right lung volume loss and atelectasis.  Acute fractures of the right lateral eighth through tenth ribs. Multiple old  left rib fracture deformities again noted.  Critical Value/emergent results were called by telephone at the time of interpretation on 12/20/2014 at 2:46 pm to Dr. Joseph Berkshire , who verbally acknowledged these results.   Electronically Signed   By: Earle Gell M.D.   On: 12/20/2014 14:46   Ct Head Wo Contrast  12/20/2014   CLINICAL DATA:  Fall 2 days ago hitting head on a bar stool. Loss of consciousness. Right-sided body pain.  EXAM: CT HEAD WITHOUT CONTRAST  TECHNIQUE: Contiguous axial images were obtained from the base of the skull through the vertex without intravenous contrast.  COMPARISON:  None.  FINDINGS: No acute infarct, hemorrhage, or mass lesion is present. No significant extraaxial fluid collection is present. The ventricles are of normal size.  Fluid is present in the left maxillary sinus. There is no associated fracture. The remaining paranasal sinuses and mastoid air cells are clear. The calvarium is intact. No significant extracranial soft tissue injury is evident. The globes and orbits are intact.  IMPRESSION: 1. Normal CT appearance of the brain. 2. Fluid in the left maxillary sinus. This likely represents acute sinusitis. Correlate for tenderness related to trauma. No acute fracture is visualized.   Electronically Signed   By: San Morelle M.D.   On: 12/20/2014 15:00   Dg Chest Port 1 View  12/21/2014   CLINICAL DATA:  Multiple rib fractures.  EXAM: PORTABLE CHEST - 1 VIEW  COMPARISON:  12/20/2014.  12/19/2013.  FINDINGS: Right chest tube in stable position. Mediastinum and hilar structures are unremarkable. Bibasilar atelectasis and/or infiltrates again noted. New platelike area of atelectasis left lower lobe. No pleural effusion or pneumothorax. Stable cardiomegaly. Right chest wall mild subcutaneous emphysema. Multiple old left displaced rib fractures again noted. Acute nondisplaced right eighth, ninth, and tenth rib fractures again noted.  IMPRESSION: 1. Right chest tube in  stable position . No pneumothorax . Nondisplaced acute right lower rib fractures again noted. Old left rib fractures again noted. 2. Bibasilar atelectasis. 3. Stable cardiomegaly.   Electronically Signed   By: Marcello Moores  Register   On: 12/21/2014 07:43   Dg Chest Port 1 View  12/20/2014   CLINICAL DATA:  Right-sided pneumothorax.  Chest tube placement.  EXAM: PORTABLE CHEST - 1 VIEW  COMPARISON:  Chest and right rib radiographs from the same day.  FINDINGS: A right-sided chest tube has been placed. The pneumothorax is resolved. Midline shift is resolved.  Bilateral airspace disease is present. The heart is enlarged. Mild edema is present. Remote left-sided rib fractures are again noted. The acute right-sided rib fractures are less well seen in this projection.  IMPRESSION: 1. Resolution of pneumothorax following placement of chest tube. 2. Midline shift is also resolved. 3. Bilateral airspace opacities and mild edema.   Electronically Signed   By: San Morelle M.D.   On: 12/20/2014 15:34    Scheduled Meds: .  antiseptic oral rinse  7 mL Mouth Rinse BID  . calcium-vitamin D  1 tablet Oral BID  . diclofenac  75 mg Oral BID  . DULoxetine  60 mg Oral Daily  . feeding supplement (ENSURE ENLIVE)  237 mL Oral BID BM  . gabapentin  300 mg Oral TID  . heparin  5,000 Units Subcutaneous 3 times per day  . levofloxacin (LEVAQUIN) IV  500 mg Intravenous Q24H  . pantoprazole  40 mg Oral Daily  . simvastatin  20 mg Oral Daily  . sodium chloride  3 mL Intravenous Q12H   Continuous Infusions: . sodium chloride 50 mL/hr at 12/21/14 0900    Active Problems:   COPD (chronic obstructive pulmonary disease)   Pneumothorax on right   Rib fractures   Pneumothorax, right    Time spent: 53mins    MEMON,JEHANZEB  [facility] Pager [phone]. If 7PM-7AM, please contact night-coverage at [email or web address], password [facility] 12/21/2014, 10:35 AM  LOS: 1 day

## 2014-12-22 ENCOUNTER — Inpatient Hospital Stay (HOSPITAL_COMMUNITY): Payer: Medicaid Other

## 2014-12-22 LAB — BASIC METABOLIC PANEL
Anion gap: 6 (ref 5–15)
BUN: 15 mg/dL (ref 6–20)
CALCIUM: 7.9 mg/dL — AB (ref 8.9–10.3)
CHLORIDE: 100 mmol/L — AB (ref 101–111)
CO2: 30 mmol/L (ref 22–32)
Creatinine, Ser: 0.67 mg/dL (ref 0.61–1.24)
GFR calc non Af Amer: >60 mL/min (ref >60)
GLUCOSE: 94 mg/dL (ref 65–99)
POTASSIUM: 3.1 mmol/L — AB (ref 3.5–5.1)
Sodium: 136 mmol/L (ref 135–145)

## 2014-12-22 LAB — CBC
HCT: 35.3 % — ABNORMAL LOW (ref 39.0–52.0)
Hemoglobin: 11.2 g/dL — ABNORMAL LOW (ref 13.0–17.0)
MCH: 26.1 pg (ref 26.0–34.0)
MCHC: 31.7 g/dL (ref 30.0–36.0)
MCV: 82.3 fL (ref 78.0–100.0)
PLATELETS: 218 10*3/uL (ref 150–400)
RBC: 4.29 MIL/uL (ref 4.22–5.81)
RDW: 17.1 % — AB (ref 11.5–15.5)
WBC: 12.7 10*3/uL — AB (ref 4.0–10.5)

## 2014-12-22 MED ORDER — IPRATROPIUM-ALBUTEROL 0.5-2.5 (3) MG/3ML IN SOLN
3.0000 mL | Freq: Four times a day (QID) | RESPIRATORY_TRACT | Status: DC
  Administered 2014-12-22 – 2014-12-24 (×8): 3 mL via RESPIRATORY_TRACT
  Filled 2014-12-22 (×8): qty 3

## 2014-12-22 MED ORDER — POTASSIUM CHLORIDE CRYS ER 20 MEQ PO TBCR
40.0000 meq | EXTENDED_RELEASE_TABLET | ORAL | Status: AC
  Administered 2014-12-22 (×3): 40 meq via ORAL
  Filled 2014-12-22 (×3): qty 2

## 2014-12-22 NOTE — Progress Notes (Signed)
Subjective: No shortness of breath.  Objective: Vital signs in last 24 hours: Temp:  [97.8 F (36.6 C)-98.6 F (37 C)] 98.5 F (36.9 C) (06/07 0533) Pulse Rate:  [64-80] 65 (06/07 0533) Resp:  [17-26] 20 (06/07 0533) BP: (108-127)/(61-79) 122/69 mmHg (06/07 0533) SpO2:  [92 %-99 %] 99 % (06/07 0533) Last BM Date: 12/18/14  Intake/Output from previous day: 06/06 0701 - 06/07 0700 In: 1930.8 [P.O.:960; I.V.:870.8; IV Piggyback:100] Out: 1219 [Urine:1050; Chest Tube:40] Intake/Output this shift: Total I/O In: -  Out: 200 [Urine:200]  General appearance: alert, cooperative and no distress Resp: Some crackles noted in the right lung field. No air leak noted in the pleura vac.  Lab Results:   Recent Labs  12/21/14 0434 12/22/14 0631  WBC 18.3* 12.7*  HGB 12.1* 11.2*  HCT 38.4* 35.3*  PLT 251 218   BMET  Recent Labs  12/21/14 0434 12/22/14 0631  NA 139 136  K 4.1 3.1*  CL 102 100*  CO2 29 30  GLUCOSE 160* 94  BUN 15 15  CREATININE 0.72 0.67  CALCIUM 8.5* 7.9*   PT/INR No results for input(s): LABPROT, INR in the last 72 hours.  Studies/Results: Dg Ribs Unilateral W/chest Right  12/20/2014   CLINICAL DATA:  Syncope and fall 2 days ago. Right-sided chest and rib pain. COPD.  EXAM: RIGHT RIBS AND CHEST - 3+ VIEW  COMPARISON:  12/19/2013  FINDINGS: A large right pneumothorax is seen with shift of mediastinal structures to the left, consistent with a tension pneumothorax. Diffuse right lung volume loss and atelectasis is demonstrated. Left lung remains grossly clear. Heart size is within normal limits.  Acute mildly displaced fractures of the right lateral eighth, ninth, and tenth ribs are noted. Multiple old left rib fracture deformities are noted.  IMPRESSION: Large right tension pneumothorax, with mediastinal shift to the left.  Diffuse right lung volume loss and atelectasis.  Acute fractures of the right lateral eighth through tenth ribs. Multiple old left rib  fracture deformities again noted.  Critical Value/emergent results were called by telephone at the time of interpretation on 12/20/2014 at 2:46 pm to Dr. Joseph Berkshire , who verbally acknowledged these results.   Electronically Signed   By: Earle Gell M.D.   On: 12/20/2014 14:46   Ct Head Wo Contrast  12/20/2014   CLINICAL DATA:  Fall 2 days ago hitting head on a bar stool. Loss of consciousness. Right-sided body pain.  EXAM: CT HEAD WITHOUT CONTRAST  TECHNIQUE: Contiguous axial images were obtained from the base of the skull through the vertex without intravenous contrast.  COMPARISON:  None.  FINDINGS: No acute infarct, hemorrhage, or mass lesion is present. No significant extraaxial fluid collection is present. The ventricles are of normal size.  Fluid is present in the left maxillary sinus. There is no associated fracture. The remaining paranasal sinuses and mastoid air cells are clear. The calvarium is intact. No significant extracranial soft tissue injury is evident. The globes and orbits are intact.  IMPRESSION: 1. Normal CT appearance of the brain. 2. Fluid in the left maxillary sinus. This likely represents acute sinusitis. Correlate for tenderness related to trauma. No acute fracture is visualized.   Electronically Signed   By: San Morelle M.D.   On: 12/20/2014 15:00   Dg Chest Port 1 View  12/22/2014   CLINICAL DATA:  Follow-up pneumothorax.  EXAM: PORTABLE CHEST - 1 VIEW  COMPARISON:  12/21/2014.  FINDINGS: Right chest tube in stable position. Mediastinum and hilar  structures are normal. Cardiomegaly. Normal pulmonary vascularity. Interim partial clearing of bibasilar atelectasis. No pneumothorax. Right lower nondisplaced rib fractures again noted. Old displaced left rib fractures again noted.  IMPRESSION: 1. Right chest tube in stable position. No pneumothorax. Right lower acute posterior rib fractures again noted. These are nondisplaced. Old left rib fractures again noted .  2.   Interim partial clearing of bibasilar atelectasis.  3.  Cardiomegaly.   Electronically Signed   By: Marcello Moores  Register   On: 12/22/2014 07:48   Dg Chest Port 1 View  12/21/2014   CLINICAL DATA:  Multiple rib fractures.  EXAM: PORTABLE CHEST - 1 VIEW  COMPARISON:  12/20/2014.  12/19/2013.  FINDINGS: Right chest tube in stable position. Mediastinum and hilar structures are unremarkable. Bibasilar atelectasis and/or infiltrates again noted. New platelike area of atelectasis left lower lobe. No pleural effusion or pneumothorax. Stable cardiomegaly. Right chest wall mild subcutaneous emphysema. Multiple old left displaced rib fractures again noted. Acute nondisplaced right eighth, ninth, and tenth rib fractures again noted.  IMPRESSION: 1. Right chest tube in stable position . No pneumothorax . Nondisplaced acute right lower rib fractures again noted. Old left rib fractures again noted. 2. Bibasilar atelectasis. 3. Stable cardiomegaly.   Electronically Signed   By: Marcello Moores  Register   On: 12/21/2014 07:43   Dg Chest Port 1 View  12/20/2014   CLINICAL DATA:  Right-sided pneumothorax.  Chest tube placement.  EXAM: PORTABLE CHEST - 1 VIEW  COMPARISON:  Chest and right rib radiographs from the same day.  FINDINGS: A right-sided chest tube has been placed. The pneumothorax is resolved. Midline shift is resolved.  Bilateral airspace disease is present. The heart is enlarged. Mild edema is present. Remote left-sided rib fractures are again noted. The acute right-sided rib fractures are less well seen in this projection.  IMPRESSION: 1. Resolution of pneumothorax following placement of chest tube. 2. Midline shift is also resolved. 3. Bilateral airspace opacities and mild edema.   Electronically Signed   By: San Morelle M.D.   On: 12/20/2014 15:34    Anti-infectives: Anti-infectives    Start     Dose/Rate Route Frequency Ordered Stop   12/20/14 1830  levofloxacin (LEVAQUIN) IVPB 500 mg     500 mg 100 mL/hr over  60 Minutes Intravenous Every 24 hours 12/20/14 1730        Assessment/Plan: Impression: Right pneumothorax, resolved Plan: We'll place chest tube to waterseal. Will repeat x-ray in a.m.  LOS: 2 days    Jaidon Ellery A 12/22/2014

## 2014-12-22 NOTE — Progress Notes (Signed)
Patient reports coughing wakes him up. Chronic pain in left chest and pain in right chest at chest tube site.  Dilaudid given

## 2014-12-22 NOTE — Progress Notes (Signed)
Patient coughing up yellow/green milky sputum moderate amount.  Chest tube intact with serosangunous fluid draining Pain with coughing.  Pain medication given.

## 2014-12-22 NOTE — Progress Notes (Signed)
[facility] PROGRESS NOTE  SEITH AIKEY KVQ:[id number] DOB: 01/08/1961 DOA: 12/20/2014 PCP: Kerin Perna, NP  Summary:  This patient was admitted to the hospital with progressive shortness of breath. He had suffered a fall approximately 2 days prior to admission, and cannot recollect the circumstances as well. He developed progressive shortness of breath. He was evaluated in the emergency room and found to have right-sided rib fractures and pneumothorax. Chest tube was placed in the emergency room. He has advanced COPD. Anticipate discharge in the next 24 hours if chest tube can be removed tomorrow.  Assessment/Plan: 1. Right-sided pneumothorax. Status post chest tube. Likely related to fall and associated rib fractures. General surgery following for chest tube management. Possibly discontinue chest tube tomorrow if stable 2. COPD exacerbation. No wheezing present at this time. Continue bronchodilators and antibiotic. 3. Chronic respiratory failure on home oxygen. Appears to be stable, near baseline. Continue current treatments. 4. Fall. I suspect this is related to polypharmacy and narcotic use. We'll need to reassess pain medication regimen on discharge. 5. Right-sided rib fractures. Continue pain management. 6. Ongoing tobacco use. Patient provided with nicotine patch   Code Status: full code Family Communication: discussed with patient  Disposition Plan: discharge home once improved   Consultants:  General surgery  Procedures:  Chest tube placement 6/5 in ED  Antibiotics:  Levofloxacin 6/5>>  HPI/Subjective: No worsening shortness of breath. No cough. Had difficulty sleeping last night.  Objective: Filed Vitals:   12/22/14 0533  BP: 122/69  Pulse: 65  Temp: 98.5 F (36.9 C)  Resp: 20    Intake/Output Summary (Last 24 hours) at 12/22/14 1156 Last data filed at 12/22/14 1020  Gross per 24 hour  Intake 1580.83 ml  Output   1890 ml  Net -309.17 ml    Filed Weights   12/20/14 1231 12/20/14 1707 12/21/14 0500  Weight: 76.204 kg (168 lb) 74.8 kg (164 lb 14.5 oz) 75.9 kg (167 lb 5.3 oz)    Exam:   General:  NAD  Cardiovascular: s1, s2, regular  Respiratory: clear bilaterally, no wheezing  Abdomen: soft, nt, nd, bs+  Musculoskeletal:  No edema b/l   Data Reviewed: Basic Metabolic Panel:  Recent Labs Lab 12/20/14 1301 12/21/14 0434 12/22/14 0631  NA 140 139 136  K 3.9 4.1 3.1*  CL 101 102 100*  CO2 29 29 30   GLUCOSE 120* 160* 94  BUN 13 15 15   CREATININE 0.84 0.72 0.67  CALCIUM 8.9 8.5* 7.9*   Liver Function Tests:  Recent Labs Lab 12/20/14 1301 12/21/14 0434  AST 23 18  ALT 25 18  ALKPHOS 71 56  BILITOT 0.6 0.6  PROT 7.2 6.3*  ALBUMIN 3.7 3.0*   No results for input(s): LIPASE, AMYLASE in the last 168 hours. No results for input(s): AMMONIA in the last 168 hours. CBC:  Recent Labs Lab 12/20/14 1301 12/21/14 0434 12/22/14 0631  WBC 17.2* 18.3* 12.7*  NEUTROABS 14.8*  --   --   HGB 13.0 12.1* 11.2*  HCT 40.2 38.4* 35.3*  MCV 82.4 83.3 82.3  PLT 254 251 218   Cardiac Enzymes:  Recent Labs Lab 12/20/14 1301  TROPONINI <0.03   BNP (last 3 results) No results for input(s): BNP in the last 8760 hours.  ProBNP (last 3 results) No results for input(s): PROBNP in the last 8760 hours.  CBG: No results for input(s): GLUCAP in the last 168 hours.  Recent Results (from the past 240 hour(s))  MRSA PCR Screening  Status: None   Collection Time: 12/20/14  5:00 PM  Result Value Ref Range Status   MRSA by PCR NEGATIVE NEGATIVE Final    Comment:        The GeneXpert MRSA Assay (FDA approved for NASAL specimens only), is one component of a comprehensive MRSA colonization surveillance program. It is not intended to diagnose MRSA infection nor to guide or monitor treatment for MRSA infections.      Studies: Dg Ribs Unilateral W/chest Right  12/20/2014   CLINICAL DATA:  Syncope and fall  2 days ago. Right-sided chest and rib pain. COPD.  EXAM: RIGHT RIBS AND CHEST - 3+ VIEW  COMPARISON:  12/19/2013  FINDINGS: A large right pneumothorax is seen with shift of mediastinal structures to the left, consistent with a tension pneumothorax. Diffuse right lung volume loss and atelectasis is demonstrated. Left lung remains grossly clear. Heart size is within normal limits.  Acute mildly displaced fractures of the right lateral eighth, ninth, and tenth ribs are noted. Multiple old left rib fracture deformities are noted.  IMPRESSION: Large right tension pneumothorax, with mediastinal shift to the left.  Diffuse right lung volume loss and atelectasis.  Acute fractures of the right lateral eighth through tenth ribs. Multiple old left rib fracture deformities again noted.  Critical Value/emergent results were called by telephone at the time of interpretation on 12/20/2014 at 2:46 pm to Dr. Joseph Berkshire , who verbally acknowledged these results.   Electronically Signed   By: Earle Gell M.D.   On: 12/20/2014 14:46   Ct Head Wo Contrast  12/20/2014   CLINICAL DATA:  Fall 2 days ago hitting head on a bar stool. Loss of consciousness. Right-sided body pain.  EXAM: CT HEAD WITHOUT CONTRAST  TECHNIQUE: Contiguous axial images were obtained from the base of the skull through the vertex without intravenous contrast.  COMPARISON:  None.  FINDINGS: No acute infarct, hemorrhage, or mass lesion is present. No significant extraaxial fluid collection is present. The ventricles are of normal size.  Fluid is present in the left maxillary sinus. There is no associated fracture. The remaining paranasal sinuses and mastoid air cells are clear. The calvarium is intact. No significant extracranial soft tissue injury is evident. The globes and orbits are intact.  IMPRESSION: 1. Normal CT appearance of the brain. 2. Fluid in the left maxillary sinus. This likely represents acute sinusitis. Correlate for tenderness related to  trauma. No acute fracture is visualized.   Electronically Signed   By: San Morelle M.D.   On: 12/20/2014 15:00   Dg Chest Port 1 View  12/22/2014   CLINICAL DATA:  Follow-up pneumothorax.  EXAM: PORTABLE CHEST - 1 VIEW  COMPARISON:  12/21/2014.  FINDINGS: Right chest tube in stable position. Mediastinum and hilar structures are normal. Cardiomegaly. Normal pulmonary vascularity. Interim partial clearing of bibasilar atelectasis. No pneumothorax. Right lower nondisplaced rib fractures again noted. Old displaced left rib fractures again noted.  IMPRESSION: 1. Right chest tube in stable position. No pneumothorax. Right lower acute posterior rib fractures again noted. These are nondisplaced. Old left rib fractures again noted .  2.  Interim partial clearing of bibasilar atelectasis.  3.  Cardiomegaly.   Electronically Signed   By: Marcello Moores  Register   On: 12/22/2014 07:48   Dg Chest Port 1 View  12/21/2014   CLINICAL DATA:  Multiple rib fractures.  EXAM: PORTABLE CHEST - 1 VIEW  COMPARISON:  12/20/2014.  12/19/2013.  FINDINGS: Right chest tube in stable position. Mediastinum  and hilar structures are unremarkable. Bibasilar atelectasis and/or infiltrates again noted. New platelike area of atelectasis left lower lobe. No pleural effusion or pneumothorax. Stable cardiomegaly. Right chest wall mild subcutaneous emphysema. Multiple old left displaced rib fractures again noted. Acute nondisplaced right eighth, ninth, and tenth rib fractures again noted.  IMPRESSION: 1. Right chest tube in stable position . No pneumothorax . Nondisplaced acute right lower rib fractures again noted. Old left rib fractures again noted. 2. Bibasilar atelectasis. 3. Stable cardiomegaly.   Electronically Signed   By: Marcello Moores  Register   On: 12/21/2014 07:43   Dg Chest Port 1 View  12/20/2014   CLINICAL DATA:  Right-sided pneumothorax.  Chest tube placement.  EXAM: PORTABLE CHEST - 1 VIEW  COMPARISON:  Chest and right rib radiographs  from the same day.  FINDINGS: A right-sided chest tube has been placed. The pneumothorax is resolved. Midline shift is resolved.  Bilateral airspace disease is present. The heart is enlarged. Mild edema is present. Remote left-sided rib fractures are again noted. The acute right-sided rib fractures are less well seen in this projection.  IMPRESSION: 1. Resolution of pneumothorax following placement of chest tube. 2. Midline shift is also resolved. 3. Bilateral airspace opacities and mild edema.   Electronically Signed   By: San Morelle M.D.   On: 12/20/2014 15:34    Scheduled Meds: . antiseptic oral rinse  7 mL Mouth Rinse BID  . calcium-vitamin D  1 tablet Oral BID  . diclofenac  75 mg Oral BID  . DULoxetine  60 mg Oral Daily  . feeding supplement (ENSURE ENLIVE)  237 mL Oral BID BM  . gabapentin  300 mg Oral TID  . heparin  5,000 Units Subcutaneous 3 times per day  . ipratropium-albuterol  3 mL Nebulization Q6H  . levofloxacin (LEVAQUIN) IV  500 mg Intravenous Q24H  . nicotine  21 mg Transdermal Daily  . pantoprazole  40 mg Oral Daily  . simvastatin  20 mg Oral Daily  . sodium chloride  3 mL Intravenous Q12H   Continuous Infusions: . sodium chloride 50 mL/hr at 12/22/14 1018    Active Problems:   COPD (chronic obstructive pulmonary disease)   Pneumothorax on right   Rib fractures   Pneumothorax, right   Malnutrition of moderate degree    Time spent: 6mins    Antwyne Pingree  [facility] Pager [phone]. If 7PM-7AM, please contact night-coverage at [email or web address], password [facility] 12/22/2014, 11:56 AM  LOS: 2 days

## 2014-12-22 NOTE — Care Management Note (Signed)
Case Management Note  Patient Details  Name: Sean Thomas MRN: [id number] Date of Birth: [birth year]  Expected Discharge Date:                  Expected Discharge Plan:  Home/Self Care  In-House Referral:  NA  Discharge planning Services  CM Consult  Post Acute Care Choice:  NA Choice offered to:  NA  DME Arranged:    DME Agency:     HH Arranged:    [place] Agency:     Status of Service:  Completed, signed off  Medicare Important Message Given:    Date Medicare IM Given:    Medicare IM give by:    Date Additional Medicare IM Given:    Additional Medicare Important Message give by:     If discussed at [place] of Stay Meetings, dates discussed:    Additional Comments: Pt's home O2 is through [facility]. Rodena Piety at [facility] contacted and confirmed that patients oxygen will cont to be paid for by Medicaid. No further CM needs anticipated.    Sherald Barge, RN 12/22/2014, 9:35 AM

## 2014-12-23 ENCOUNTER — Inpatient Hospital Stay (HOSPITAL_COMMUNITY): Payer: Medicaid Other

## 2014-12-23 DIAGNOSIS — S2231XS Fracture of one rib, right side, sequela: Secondary | ICD-10-CM

## 2014-12-23 DIAGNOSIS — E440 Moderate protein-calorie malnutrition: Secondary | ICD-10-CM

## 2014-12-23 DIAGNOSIS — J441 Chronic obstructive pulmonary disease with (acute) exacerbation: Secondary | ICD-10-CM

## 2014-12-23 LAB — CBC
HEMATOCRIT: 37.5 % — AB (ref 39.0–52.0)
HEMOGLOBIN: 11.7 g/dL — AB (ref 13.0–17.0)
MCH: 26.3 pg (ref 26.0–34.0)
MCHC: 31.2 g/dL (ref 30.0–36.0)
MCV: 84.3 fL (ref 78.0–100.0)
Platelets: 249 10*3/uL (ref 150–400)
RBC: 4.45 MIL/uL (ref 4.22–5.81)
RDW: 17.5 % — ABNORMAL HIGH (ref 11.5–15.5)
WBC: 10.1 10*3/uL (ref 4.0–10.5)

## 2014-12-23 LAB — BASIC METABOLIC PANEL
Anion gap: 6 (ref 5–15)
BUN: 8 mg/dL (ref 6–20)
CO2: 33 mmol/L — AB (ref 22–32)
Calcium: 8.3 mg/dL — ABNORMAL LOW (ref 8.9–10.3)
Chloride: 102 mmol/L (ref 101–111)
Creatinine, Ser: 0.6 mg/dL — ABNORMAL LOW (ref 0.61–1.24)
GFR calc Af Amer: >60 mL/min (ref >60)
GFR calc non Af Amer: >60 mL/min (ref >60)
Glucose, Bld: 91 mg/dL (ref 65–99)
Potassium: 4.4 mmol/L (ref 3.5–5.1)
SODIUM: 141 mmol/L (ref 135–145)

## 2014-12-23 NOTE — Progress Notes (Signed)
Radiologist from [facility] notified me of results from 12/23/14 CXR. Chest tube has been pulled out so that tip of tube is now located in soft tissue. Pneumothorax has increased 15%. Notified Dr. Arnoldo Morale via email.

## 2014-12-23 NOTE — Progress Notes (Signed)
[facility] PROGRESS NOTE  Sean Thomas JHE:[id number] DOB: 1961-06-29    PCP:   Kerin Perna, NP   HPI: Sean Thomas is an 54 y.o. male admited by Dr Anastasio Champion after a fall with hx of ataxia, Fx right 02-22-09 Ribs, right pneumothorax, s/p chest tube placement by the EDP.  Dr Arnoldo Morale has been consulted, and since the tube was not at the right place, he discontinued it.  His repeat CXR showed increase PTx, and he recommended transfer to [facility] for thoracic surgery consultation.  I spoke with Dr Koleen Nimrod, who doesn't feel that he require thoracic service.  I spoke with Dr Arnoldo Morale, who will follow him here.  He is stable, and if decompensates, he will insert another chest tube.  Patient is not having any respiratory distress at this time.  HR, BP, and RR are all normal.    Rewiew of Systems:  Constitutional: Negative for malaise, fever and chills. No significant weight loss or weight gain Eyes: Negative for eye pain, redness and discharge, diplopia, visual changes, or flashes of light. ENMT: Negative for ear pain, hoarseness, nasal congestion, sinus pressure and sore throat. No headaches; tinnitus, drooling, or problem swallowing. Cardiovascular: Negative for chest pain, palpitations, diaphoresis, dyspnea and peripheral edema. ; No orthopnea, PND Respiratory: Negative for cough, hemoptysis, wheezing and stridor. No pleuritic chestpain. Gastrointestinal: Negative for nausea, vomiting, diarrhea, constipation, abdominal pain, melena, blood in stool, hematemesis, jaundice and rectal bleeding.    Genitourinary: Negative for frequency, dysuria, incontinence,flank pain and hematuria; Musculoskeletal: Negative for back pain and neck pain. Negative for swelling and trauma.;  Skin: . Negative for pruritus, rash, abrasions, bruising and skin lesion.; ulcerations Neuro: Negative for headache, lightheadedness and neck stiffness. Negative for weakness, altered level of consciousness , altered mental  status, extremity weakness, burning feet, involuntary movement, seizure and syncope.  Psych: negative for anxiety, depression, insomnia, tearfulness, panic attacks, hallucinations, paranoia, suicidal or homicidal ideation    Past Medical History  Diagnosis Date  . COPD (chronic obstructive pulmonary disease)   . Hyperlipidemia   . Hypertension   . DDD (degenerative disc disease)   . Inguinal hernia 12/19/2013  . Fracture of occipital condyle 11/21/2012  . Fracture of multiple ribs 11/01/2012  . Cervical pain 11/21/2012  . Shortness of breath dyspnea   . Headache     Past Surgical History  Procedure Laterality Date  . Back surgery    . Lumbar fusion    . Inguinal hernia repair Left 07/24/2014    Procedure: LAPAROSCOPIC LEFT INGUINAL HERNIA REPAIR ;  Surgeon: Ralene Ok, MD;  Location: [place];  Service: General;  Laterality: Left;  . Insertion of mesh Left 07/24/2014    Procedure: INSERTION OF MESH;  Surgeon: Ralene Ok, MD;  Location: [place];  Service: General;  Laterality: Left;    Medications:  HOME MEDS: Prior to Admission medications   Medication Sig Start Date End Date Taking? Authorizing Provider  ANORO ELLIPTA 62.5-25 MCG/INH AEPB USE 1 PUFF BY MOUTH DAILY 07/30/14  Yes Collene Gobble, MD  aspirin EC 81 MG tablet Take 81 mg by mouth daily.   Yes Historical Provider, MD  calcium-vitamin D (OSCAL WITH D) 500-200 MG-UNIT per tablet Take 1 tablet by mouth 2 (two) times daily. VITAMIN D3 ORAL CAPSULE 1,000 UNIT TAKE 1 CAPSULE BY ORAL ROUTE DAILY   Yes Historical Provider, MD  cyclobenzaprine (FLEXERIL) 10 MG tablet Take 10 mg by mouth 3 (three) times daily as needed for muscle spasms.  Yes Historical Provider, MD  diclofenac (VOLTAREN) 75 MG EC tablet Take 75 mg by mouth 2 (two) times daily.   Yes Historical Provider, MD  DULoxetine (CYMBALTA) 60 MG capsule Take 60 mg by mouth daily.   Yes Historical Provider, MD  gabapentin (NEURONTIN) 300 MG capsule Take 300 mg by mouth 3  (three) times daily.   Yes Historical Provider, MD  omeprazole (PRILOSEC) 20 MG capsule Take 20 mg by mouth daily.   Yes Historical Provider, MD  oxyCODONE-acetaminophen (PERCOCET) 10-325 MG per tablet Take 1 tablet by mouth every 4 (four) hours as needed for pain.   Yes Historical Provider, MD  simvastatin (ZOCOR) 40 MG tablet Take 20 mg by mouth daily.    Yes Historical Provider, MD  albuterol (PROVENTIL HFA;VENTOLIN HFA) 108 (90 BASE) MCG/ACT inhaler Inhale into the lungs every 6 (six) hours as needed for wheezing or shortness of breath.    Historical Provider, MD  ALPRAZolam Duanne Moron) 0.5 MG tablet Take 0.5 mg by mouth at bedtime as needed for sleep.    Historical Provider, MD  ondansetron (ZOFRAN) 4 MG tablet Take 4 mg by mouth every 8 (eight) hours as needed for nausea or vomiting.    Historical Provider, MD  oxyCODONE-acetaminophen (PERCOCET) 7.5-325 MG per tablet Take 1 tablet by mouth every 4 (four) hours as needed for pain. Patient not taking: Reported on 12/20/2014 07/24/14   Ralene Ok, MD     Allergies:  Allergies  Allergen Reactions  . Advil [Ibuprofen] Diarrhea  . Aleve [Naproxen Sodium] Nausea And Vomiting    diarrhea    Social History:   reports that he has been smoking Cigarettes.  He has a 37 pack-year smoking history. He has never used smokeless tobacco. He reports that he drinks alcohol. He reports that he does not use illicit drugs.  Family History: Family History  Problem Relation Age of Onset  . Diabetes Mother      Physical Exam: Filed Vitals:   12/22/14 2020 12/23/14 0303 12/23/14 0505 12/23/14 0725  BP: 128/80  132/93   Pulse: 79  72   Temp: 98.7 F (37.1 C)  98.2 F (36.8 C)   TempSrc: Oral  Oral   Resp: 24  18   Height:      Weight:      SpO2: 98% 96% 99% 95%   Blood pressure 132/93, pulse 72, temperature 98.2 F (36.8 C), temperature source Oral, resp. rate 18, height 6\' 3"  (1.905 m), weight 75.9 kg (167 lb 5.3 oz), SpO2 95 %.  GEN:   Pleasant  patient lying in the stretcher in no acute distress; cooperative with exam. PSYCH:  alert and oriented x4; does not appear anxious or depressed; affect is appropriate. HEENT: Mucous membranes pink and anicteric; PERRLA; EOM intact; no cervical lymphadenopathy nor thyromegaly or carotid bruit; no JVD; There were no stridor. Neck is very supple. Breasts:: Not examined CHEST WALL: No tenderness CHEST: Normal respiration, decrease BS on the right side.  HEART: Regular rate and rhythm.  There are no murmur, rub, or gallops.   BACK: No kyphosis or scoliosis; no CVA tenderness ABDOMEN: soft and non-tender; no masses, no organomegaly, normal abdominal bowel sounds; no pannus; no intertriginous candida. There is no rebound and no distention. Rectal Exam: Not done EXTREMITIES: No bone or joint deformity; age-appropriate arthropathy of the hands and knees; no edema; no ulcerations.  There is no calf tenderness. Genitalia: not examined PULSES: 2+ and symmetric SKIN: Normal hydration no rash or ulceration CNS:  Cranial nerves 2-12 grossly intact no focal lateralizing neurologic deficit.  Speech is fluent; uvula elevated with phonation, facial symmetry and tongue midline. DTR are normal bilaterally, cerebella exam is intact, barbinski is negative and strengths are equaled bilaterally.  No sensory loss.   Labs on Admission:  Basic Metabolic Panel:  Recent Labs Lab 12/20/14 1301 12/21/14 0434 12/22/14 0631 12/23/14 0636  NA 140 139 136 141  K 3.9 4.1 3.1* 4.4  CL 101 102 100* 102  CO2 29 29 30  33*  GLUCOSE 120* 160* 94 91  BUN 13 15 15 8   CREATININE 0.84 0.72 0.67 0.60*  CALCIUM 8.9 8.5* 7.9* 8.3*   Liver Function Tests:  Recent Labs Lab 12/20/14 1301 12/21/14 0434  AST 23 18  ALT 25 18  ALKPHOS 71 56  BILITOT 0.6 0.6  PROT 7.2 6.3*  ALBUMIN 3.7 3.0*   No results for input(s): LIPASE, AMYLASE in the last 168 hours. No results for input(s): AMMONIA in the last 168  hours. CBC:  Recent Labs Lab 12/20/14 1301 12/21/14 0434 12/22/14 0631 12/23/14 0636  WBC 17.2* 18.3* 12.7* 10.1  NEUTROABS 14.8*  --   --   --   HGB 13.0 12.1* 11.2* 11.7*  HCT 40.2 38.4* 35.3* 37.5*  MCV 82.4 83.3 82.3 84.3  PLT 254 251 218 249   Cardiac Enzymes:  Recent Labs Lab 12/20/14 1301  TROPONINI <0.03   Radiological Exams on Admission: Dg Chest Port 1 View  12/23/2014   CLINICAL DATA:  Interval removal of chest tube.  EXAM: PORTABLE CHEST - 1 VIEW  COMPARISON:  12/23/2014  FINDINGS: There has been increase in volume of the right pneumothorax. This is now all twice the size it was previously. There is gas identified within the right chest wall. Similar to previous exam. Similar appearance of chronic left rib deformities.  IMPRESSION: 1. Increase in volume of right-sided pneumothorax.   Electronically Signed   By: Kerby Moors M.D.   On: 12/23/2014 08:52   Dg Chest Port 1 View  12/23/2014   CLINICAL DATA:  Follow-up of right-sided pneumothorax with chest tube treatment  EXAM: PORTABLE CHEST - 1 VIEW  COMPARISON:  Portable chest x-ray of December 22, 2014  FINDINGS: The right-sided chest tube has been withdrawn such that its tip lies outside the thoracic cage. There remains a right-sided pneumothorax than amounts to approximately 15% of the lung volume. There is increased subcutaneous emphysema in the right axillary region and base of the right neck. There is stable mild tracheal deviation toward the right. The cardiac silhouette is enlarged but stable. The pulmonary vascularity is normal. There are knownright posterior rib fractures which are not well demonstrated. Multiple old left rib deformities are present.  IMPRESSION: 1. Interval withdrawal of the right chest tube since it its tip no longer lies in the pleural space. The right-sided pneumothorax now amounts to approximately 15% of the lung volume. 2. No significant change otherwise. 3. These results were called by telephone at  the time of interpretation on 12/23/2014 at 7:52 am to [facility], RN,, who verbally acknowledged these results.   Electronically Signed   By: David  Martinique M.D.   On: 12/23/2014 07:52   Dg Chest Port 1 View  12/22/2014   CLINICAL DATA:  Follow-up pneumothorax.  EXAM: PORTABLE CHEST - 1 VIEW  COMPARISON:  12/21/2014.  FINDINGS: Right chest tube in stable position. Mediastinum and hilar structures are normal. Cardiomegaly. Normal pulmonary vascularity. Interim partial clearing of bibasilar atelectasis.  No pneumothorax. Right lower nondisplaced rib fractures again noted. Old displaced left rib fractures again noted.  IMPRESSION: 1. Right chest tube in stable position. No pneumothorax. Right lower acute posterior rib fractures again noted. These are nondisplaced. Old left rib fractures again noted .  2.  Interim partial clearing of bibasilar atelectasis.  3.  Cardiomegaly.   Electronically Signed   By: Marcello Moores  Register   On: 12/22/2014 07:48    Assessment/Plan Present on Admission:  . Pneumothorax on right . COPD (chronic obstructive pulmonary disease) . Pneumothorax, right  PLAN: Will repeat CXR in the am.  Keep patient at [facility] and Dr Arnoldo Morale will follow up.  We ll call him if there is any decompensation.  COPD on oxygen at baseline.  Will continue.   Other plans as per orders.  Code Status: FULL Haskel Khan, MD. [facility] Pager [phone] 7pm to 7am.  12/23/2014, 10:25 AM

## 2014-12-23 NOTE — Progress Notes (Addendum)
Chest tube removed as it had become displaced while patient was getting out of bed. A repeat chest x-ray shows an increase in the right pneumothorax, but patient is not short of breath and stable. Recommend transfer to [facility] for follow-up by thoracic surgery. May need replacement of chest tube. Patient has long-standing history of COPD.

## 2014-12-24 ENCOUNTER — Inpatient Hospital Stay (HOSPITAL_COMMUNITY): Payer: Medicaid Other

## 2014-12-24 DIAGNOSIS — J438 Other emphysema: Secondary | ICD-10-CM

## 2014-12-24 MED ORDER — OXYCODONE-ACETAMINOPHEN 7.5-325 MG PO TABS: 1.0000 | ORAL_TABLET | ORAL | Status: DC | PRN

## 2014-12-24 MED ORDER — NICOTINE 21 MG/24HR TD PT24: 21.0000 mg | MEDICATED_PATCH | Freq: Every day | TRANSDERMAL | Status: DC

## 2014-12-24 NOTE — Progress Notes (Signed)
Subjective: No shortness of breath.  Objective: Vital signs in last 24 hours: Temp:  [97.6 F (36.4 C)-98.4 F (36.9 C)] 97.6 F (36.4 C) (06/09 0612) Pulse Rate:  [62-75] 62 (06/09 0612) Resp:  [18-20] 20 (06/09 0612) BP: (117-130)/(72-90) 117/73 mmHg (06/09 0612) SpO2:  [93 %-98 %] 93 % (06/09 0701) Last BM Date: 12/23/14  Intake/Output from previous day: 06/08 0701 - 06/09 0700 In: 1490 [P.O.:840; I.V.:550; IV Piggyback:100] Out: 3150 [Urine:3150] Intake/Output this shift:    General appearance: alert, cooperative and no distress Resp: Relatively clear to auscultation bilaterally. Decreasing subcutaneous emphysema noted.  Lab Results:   Recent Labs  12/22/14 0631 12/23/14 0636  WBC 12.7* 10.1  HGB 11.2* 11.7*  HCT 35.3* 37.5*  PLT 218 249   BMET  Recent Labs  12/22/14 0631 12/23/14 0636  NA 136 141  K 3.1* 4.4  CL 100* 102  CO2 30 33*  GLUCOSE 94 91  BUN 15 8  CREATININE 0.67 0.60*  CALCIUM 7.9* 8.3*   PT/INR No results for input(s): LABPROT, INR in the last 72 hours.  Studies/Results: Dg Chest Port 1 View  12/24/2014   CLINICAL DATA:  Right pneumothorax  EXAM: PORTABLE CHEST - 1 VIEW  COMPARISON:  12/23/2014  FINDINGS: Improving right pneumothorax. No significant pleural effusion. No chest tube in place.  Increase in right lower lobe atelectasis.  Multiple displaced chronic left rib fractures noted.  IMPRESSION: Interval improvement in right pneumothorax  Increase in right lower lobe atelectasis.   Electronically Signed   By: Franchot Gallo M.D.   On: 12/24/2014 07:10   Dg Chest Port 1 View  12/23/2014   CLINICAL DATA:  Interval removal of chest tube.  EXAM: PORTABLE CHEST - 1 VIEW  COMPARISON:  12/23/2014  FINDINGS: There has been increase in volume of the right pneumothorax. This is now all twice the size it was previously. There is gas identified within the right chest wall. Similar to previous exam. Similar appearance of chronic left rib  deformities.  IMPRESSION: 1. Increase in volume of right-sided pneumothorax.   Electronically Signed   By: Kerby Moors M.D.   On: 12/23/2014 08:52   Dg Chest Port 1 View  12/23/2014   CLINICAL DATA:  Follow-up of right-sided pneumothorax with chest tube treatment  EXAM: PORTABLE CHEST - 1 VIEW  COMPARISON:  Portable chest x-ray of December 22, 2014  FINDINGS: The right-sided chest tube has been withdrawn such that its tip lies outside the thoracic cage. There remains a right-sided pneumothorax than amounts to approximately 15% of the lung volume. There is increased subcutaneous emphysema in the right axillary region and base of the right neck. There is stable mild tracheal deviation toward the right. The cardiac silhouette is enlarged but stable. The pulmonary vascularity is normal. There are knownright posterior rib fractures which are not well demonstrated. Multiple old left rib deformities are present.  IMPRESSION: 1. Interval withdrawal of the right chest tube since it its tip no longer lies in the pleural space. The right-sided pneumothorax now amounts to approximately 15% of the lung volume. 2. No significant change otherwise. 3. These results were called by telephone at the time of interpretation on 12/23/2014 at 7:52 am to [facility], RN,, who verbally acknowledged these results.   Electronically Signed   By: David  Martinique M.D.   On: 12/23/2014 07:52    Anti-infectives: Anti-infectives    Start     Dose/Rate Route Frequency Ordered Stop   12/20/14 1830  levofloxacin (LEVAQUIN) IVPB 500 mg     500 mg 100 mL/hr over 60 Minutes Intravenous Every 24 hours 12/20/14 1730        Assessment/Plan: Impression: Interval improvement of right pneumothorax. This appears to be upper lobe in nature. Patient is on 3 L of nasal cannula O2 at home. His oxygen saturations are good. Patient is okay for discharge from surgery standpoint. I will see him in my office next week for follow-up x-ray. This was discussed  with Dr. Marin Comment.  LOS: 4 days    Aviva Signs A 12/24/2014

## 2014-12-24 NOTE — Progress Notes (Signed)
Discharge instructions given on medications,and follow up visits,patient verbalized understanding,prescriptions sent with patient. Vital signs stable. Accompanied by staff to an awaiting vehicle.Marland KitchenMarland Kitchen

## 2014-12-24 NOTE — Discharge Summary (Signed)
Physician Discharge Summary  Sean Thomas TFT:[id number] DOB: 11-09-1960 DOA: 12/20/2014  PCP: Kerin Perna, NP  Admit date: 12/20/2014 Discharge date: 12/24/2014  Time spent: 60 minutes  Recommendations for Outpatient Follow-up:  1. Follow up with Dr Arnoldo Morale in one week. 2. Follow up with PCP in one week.   Discharge Diagnoses:  Active Problems:   COPD (chronic obstructive pulmonary disease)   Pneumothorax on right   Rib fractures   Pneumothorax, right   Malnutrition of moderate degree   Discharge Condition: Stable.   Diet recommendation: As tolerated.    Filed Weights   12/20/14 1231 12/20/14 1707 12/21/14 0500  Weight: 76.204 kg (168 lb) 74.8 kg (164 lb 14.5 oz) 75.9 kg (167 lb 5.3 oz)    History of present illness: Patient was admitted by Dr Anastasio Champion on 12/20/14 for right pneumothorax and rib Fx after a fall 2 days PTA. As per his H and P:  " This is a 54 year old man who came to the emergency room with right-sided chest pain associated with dyspnea. He apparently had a fall 2 days ago and apparently had loss of consciousness but he cannot give me more details about this. He also has had generalized chronic pains from an accident 2 years ago and takes. Medications on a chronic basis. He has history of COPD and is oxygen dependent, he says he takes 3-4 L oxygen per minute. He denies head injury. There is no palpitations, new limb weakness, or arm pain. Evaluation in the emergency room showed this patient to have a right-sided tension pneumothorax. Chest tube was inserted promptly and he has had some relief. The chest x-ray postprocedure shows complete expansion of the lung on that side. He does have rib fractures also off the right lateral eighth to 10th ribs . There also multiple old left rib fracture seen. Postprocedure x-ray also is suggestive of infiltrates. He is now being admitted for further management.   Hospital Course: Patient was admitted into the hospital, and surgery  was consulted.  He was seen by Dr Arnoldo Morale, and he felt that the right chest tube placed in the ER was not at the proper place, so he removed the tube.  The follow up CXR showed worsening of the PThx, with collapse increased to twice the side, with similar previously seen subcutaneous emphysema and rib fractures 8,9, and 10.  He recommended transferring patient to [facility] for thoracic surgery consultation.  Dr Koleen Nimrod of cardiothoracic surgeon, however, did not feel that he required this service.  He was kept watch, with little medical intervention required.  Today, a repeat CXR showed improvement of the PThx.  Dr Arnoldo Morale also felt that it is likely he has just the upperlobe collapse, and recommended that he be discharged with follow up with him in one week.  He will be given some percocet to be used judiciously.   Thank you for allowing me to participate in his care.   Procedures:  Chest tube placement.   Consultations:  Dr Arnoldo Morale.   Discharge Exam: Filed Vitals:   12/24/14 0612  BP: 117/73  Pulse: 62  Temp: 97.6 F (36.4 C)  Resp: 20    General: Stable.  Cardiovascular: S1S2 regular.  Respiratory: Clear lungs bilaterally.   Discharge Instructions   Discharge Instructions    Diet - low sodium heart healthy    Complete by:  As directed      Discharge instructions    Complete by:  As directed   Your lung  collapse has improved.  But if you have more shortness of breath, you must come to the ER immediately, because it can get worse.  The surgeon Dr Arnoldo Morale would like to see you next week.     Increase activity slowly    Complete by:  As directed           Current Discharge Medication List    START taking these medications   Details  nicotine (NICODERM CQ - DOSED IN MG/24 HOURS) 21 mg/24hr patch Place 1 patch (21 mg total) onto the skin daily. Qty: 28 patch, Refills: 0      CONTINUE these medications which have CHANGED   Details  oxyCODONE-acetaminophen (PERCOCET) 7.5-325  MG per tablet Take 1 tablet by mouth every 4 (four) hours as needed for severe pain. Qty: 20 tablet, Refills: 0      CONTINUE these medications which have NOT CHANGED   Details  ANORO ELLIPTA 62.5-25 MCG/INH AEPB USE 1 PUFF BY MOUTH DAILY Qty: 60 each, Refills: 5    aspirin EC 81 MG tablet Take 81 mg by mouth daily.    calcium-vitamin D (OSCAL WITH D) 500-200 MG-UNIT per tablet Take 1 tablet by mouth 2 (two) times daily. VITAMIN D3 ORAL CAPSULE 1,000 UNIT TAKE 1 CAPSULE BY ORAL ROUTE DAILY    DULoxetine (CYMBALTA) 60 MG capsule Take 60 mg by mouth daily.    gabapentin (NEURONTIN) 300 MG capsule Take 300 mg by mouth 3 (three) times daily.    omeprazole (PRILOSEC) 20 MG capsule Take 20 mg by mouth daily.    simvastatin (ZOCOR) 40 MG tablet Take 20 mg by mouth daily.     albuterol (PROVENTIL HFA;VENTOLIN HFA) 108 (90 BASE) MCG/ACT inhaler Inhale into the lungs every 6 (six) hours as needed for wheezing or shortness of breath.    ALPRAZolam (XANAX) 0.5 MG tablet Take 0.5 mg by mouth at bedtime as needed for sleep.      STOP taking these medications     cyclobenzaprine (FLEXERIL) 10 MG tablet      diclofenac (VOLTAREN) 75 MG EC tablet      oxyCODONE-acetaminophen (PERCOCET) 10-325 MG per tablet      ondansetron (ZOFRAN) 4 MG tablet        Allergies  Allergen Reactions  . Advil [Ibuprofen] Diarrhea  . Aleve [Naproxen Sodium] Nausea And Vomiting    diarrhea   Follow-up Information    Follow up with Jamesetta So, MD. Schedule an appointment as soon as possible for a visit on 12/29/2014.   Specialty:  General Surgery   Contact information:   [address] [place] [address] [phone]        The results of significant diagnostics from this hospitalization (including imaging, microbiology, ancillary and laboratory) are listed below for reference.    Significant Diagnostic Studies: Dg Ribs Unilateral W/chest Right  12/20/2014   CLINICAL DATA:  Syncope and  fall 2 days ago. Right-sided chest and rib pain. COPD.  EXAM: RIGHT RIBS AND CHEST - 3+ VIEW  COMPARISON:  12/19/2013  FINDINGS: A large right pneumothorax is seen with shift of mediastinal structures to the left, consistent with a tension pneumothorax. Diffuse right lung volume loss and atelectasis is demonstrated. Left lung remains grossly clear. Heart size is within normal limits.  Acute mildly displaced fractures of the right lateral eighth, ninth, and tenth ribs are noted. Multiple old left rib fracture deformities are noted.  IMPRESSION: Large right tension pneumothorax, with mediastinal shift to the left.  Diffuse right lung volume loss and atelectasis.  Acute fractures of the right lateral eighth through tenth ribs. Multiple old left rib fracture deformities again noted.  Critical Value/emergent results were called by telephone at the time of interpretation on 12/20/2014 at 2:46 pm to Dr. Joseph Berkshire , who verbally acknowledged these results.   Electronically Signed   By: Earle Gell M.D.   On: 12/20/2014 14:46   Ct Head Wo Contrast  12/20/2014   CLINICAL DATA:  Fall 2 days ago hitting head on a bar stool. Loss of consciousness. Right-sided body pain.  EXAM: CT HEAD WITHOUT CONTRAST  TECHNIQUE: Contiguous axial images were obtained from the base of the skull through the vertex without intravenous contrast.  COMPARISON:  None.  FINDINGS: No acute infarct, hemorrhage, or mass lesion is present. No significant extraaxial fluid collection is present. The ventricles are of normal size.  Fluid is present in the left maxillary sinus. There is no associated fracture. The remaining paranasal sinuses and mastoid air cells are clear. The calvarium is intact. No significant extracranial soft tissue injury is evident. The globes and orbits are intact.  IMPRESSION: 1. Normal CT appearance of the brain. 2. Fluid in the left maxillary sinus. This likely represents acute sinusitis. Correlate for tenderness related to  trauma. No acute fracture is visualized.   Electronically Signed   By: San Morelle M.D.   On: 12/20/2014 15:00   Dg Chest Port 1 View  12/24/2014   CLINICAL DATA:  Right pneumothorax  EXAM: PORTABLE CHEST - 1 VIEW  COMPARISON:  12/23/2014  FINDINGS: Improving right pneumothorax. No significant pleural effusion. No chest tube in place.  Increase in right lower lobe atelectasis.  Multiple displaced chronic left rib fractures noted.  IMPRESSION: Interval improvement in right pneumothorax  Increase in right lower lobe atelectasis.   Electronically Signed   By: Franchot Gallo M.D.   On: 12/24/2014 07:10   Dg Chest Port 1 View  12/23/2014   CLINICAL DATA:  Interval removal of chest tube.  EXAM: PORTABLE CHEST - 1 VIEW  COMPARISON:  12/23/2014  FINDINGS: There has been increase in volume of the right pneumothorax. This is now all twice the size it was previously. There is gas identified within the right chest wall. Similar to previous exam. Similar appearance of chronic left rib deformities.  IMPRESSION: 1. Increase in volume of right-sided pneumothorax.   Electronically Signed   By: Kerby Moors M.D.   On: 12/23/2014 08:52   Dg Chest Port 1 View  12/23/2014   CLINICAL DATA:  Follow-up of right-sided pneumothorax with chest tube treatment  EXAM: PORTABLE CHEST - 1 VIEW  COMPARISON:  Portable chest x-ray of December 22, 2014  FINDINGS: The right-sided chest tube has been withdrawn such that its tip lies outside the thoracic cage. There remains a right-sided pneumothorax than amounts to approximately 15% of the lung volume. There is increased subcutaneous emphysema in the right axillary region and base of the right neck. There is stable mild tracheal deviation toward the right. The cardiac silhouette is enlarged but stable. The pulmonary vascularity is normal. There are knownright posterior rib fractures which are not well demonstrated. Multiple old left rib deformities are present.  IMPRESSION: 1. Interval  withdrawal of the right chest tube since it its tip no longer lies in the pleural space. The right-sided pneumothorax now amounts to approximately 15% of the lung volume. 2. No significant change otherwise. 3. These results were called by telephone at the  time of interpretation on 12/23/2014 at 7:52 am to [facility], RN,, who verbally acknowledged these results.   Electronically Signed   By: David  Martinique M.D.   On: 12/23/2014 07:52   Dg Chest Port 1 View  12/22/2014   CLINICAL DATA:  Follow-up pneumothorax.  EXAM: PORTABLE CHEST - 1 VIEW  COMPARISON:  12/21/2014.  FINDINGS: Right chest tube in stable position. Mediastinum and hilar structures are normal. Cardiomegaly. Normal pulmonary vascularity. Interim partial clearing of bibasilar atelectasis. No pneumothorax. Right lower nondisplaced rib fractures again noted. Old displaced left rib fractures again noted.  IMPRESSION: 1. Right chest tube in stable position. No pneumothorax. Right lower acute posterior rib fractures again noted. These are nondisplaced. Old left rib fractures again noted .  2.  Interim partial clearing of bibasilar atelectasis.  3.  Cardiomegaly.   Electronically Signed   By: Marcello Moores  Register   On: 12/22/2014 07:48   Dg Chest Port 1 View  12/21/2014   CLINICAL DATA:  Multiple rib fractures.  EXAM: PORTABLE CHEST - 1 VIEW  COMPARISON:  12/20/2014.  12/19/2013.  FINDINGS: Right chest tube in stable position. Mediastinum and hilar structures are unremarkable. Bibasilar atelectasis and/or infiltrates again noted. New platelike area of atelectasis left lower lobe. No pleural effusion or pneumothorax. Stable cardiomegaly. Right chest wall mild subcutaneous emphysema. Multiple old left displaced rib fractures again noted. Acute nondisplaced right eighth, ninth, and tenth rib fractures again noted.  IMPRESSION: 1. Right chest tube in stable position . No pneumothorax . Nondisplaced acute right lower rib fractures again noted. Old left rib  fractures again noted. 2. Bibasilar atelectasis. 3. Stable cardiomegaly.   Electronically Signed   By: Marcello Moores  Register   On: 12/21/2014 07:43   Dg Chest Port 1 View  12/20/2014   CLINICAL DATA:  Right-sided pneumothorax.  Chest tube placement.  EXAM: PORTABLE CHEST - 1 VIEW  COMPARISON:  Chest and right rib radiographs from the same day.  FINDINGS: A right-sided chest tube has been placed. The pneumothorax is resolved. Midline shift is resolved.  Bilateral airspace disease is present. The heart is enlarged. Mild edema is present. Remote left-sided rib fractures are again noted. The acute right-sided rib fractures are less well seen in this projection.  IMPRESSION: 1. Resolution of pneumothorax following placement of chest tube. 2. Midline shift is also resolved. 3. Bilateral airspace opacities and mild edema.   Electronically Signed   By: San Morelle M.D.   On: 12/20/2014 15:34    Microbiology: Recent Results (from the past 240 hour(s))  MRSA PCR Screening     Status: None   Collection Time: 12/20/14  5:00 PM  Result Value Ref Range Status   MRSA by PCR NEGATIVE NEGATIVE Final    Comment:        The GeneXpert MRSA Assay (FDA approved for NASAL specimens only), is one component of a comprehensive MRSA colonization surveillance program. It is not intended to diagnose MRSA infection nor to guide or monitor treatment for MRSA infections.      Labs: Basic Metabolic Panel:  Recent Labs Lab 12/20/14 1301 12/21/14 0434 12/22/14 0631 12/23/14 0636  NA 140 139 136 141  K 3.9 4.1 3.1* 4.4  CL 101 102 100* 102  CO2 29 29 30  33*  GLUCOSE 120* 160* 94 91  BUN 13 15 15 8   CREATININE 0.84 0.72 0.67 0.60*  CALCIUM 8.9 8.5* 7.9* 8.3*   Liver Function Tests:  Recent Labs Lab 12/20/14 1301 12/21/14 0434  AST  23 18  ALT 25 18  ALKPHOS 71 56  BILITOT 0.6 0.6  PROT 7.2 6.3*  ALBUMIN 3.7 3.0*   No results for input(s): LIPASE, AMYLASE in the last 168 hours. No results for  input(s): AMMONIA in the last 168 hours. CBC:  Recent Labs Lab 12/20/14 1301 12/21/14 0434 12/22/14 0631 12/23/14 0636  WBC 17.2* 18.3* 12.7* 10.1  NEUTROABS 14.8*  --   --   --   HGB 13.0 12.1* 11.2* 11.7*  HCT 40.2 38.4* 35.3* 37.5*  MCV 82.4 83.3 82.3 84.3  PLT 254 251 218 249   Cardiac Enzymes:  Recent Labs Lab 12/20/14 1301  TROPONINI <0.03   Signed:  Estefan Pattison  [facility] 12/24/2014, 9:04 AM

## 2014-12-24 NOTE — Care Management Note (Signed)
Case Management Note  Patient Details  Name: ELZA SORTOR MRN: [id number] Date of Birth: Jul 08, 1961  Subjective/Objective:                    Action/Plan:   Expected Discharge Date:                  Expected Discharge Plan:  Home/Self Care  In-House Referral:  NA  Discharge planning Services  CM Consult  Post Acute Care Choice:  NA Choice offered to:  NA  DME Arranged:    DME Agency:     HH Arranged:    [place] Agency:     Status of Service:  Completed, signed off  Medicare Important Message Given:    Date Medicare IM Given:    Medicare IM give by:    Date Additional Medicare IM Given:    Additional Medicare Important Message give by:     If discussed at [place] of Stay Meetings, dates discussed:    Additional Comments: Pt discharged home today. Pt has home O2 in place. No further CM needs noted. Christinia Gully [place], RN 12/24/2014, 10:04 AM

## 2014-12-29 ENCOUNTER — Other Ambulatory Visit (HOSPITAL_COMMUNITY): Payer: Self-pay | Admitting: General Surgery

## 2014-12-29 ENCOUNTER — Ambulatory Visit (HOSPITAL_COMMUNITY)
Admission: RE | Admit: 2014-12-29 | Discharge: 2014-12-29 | Disposition: A | Discharge status: Home / Self Care | Payer: Medicaid Other | Source: Ambulatory Visit | Attending: General Surgery | Admitting: General Surgery

## 2014-12-29 DIAGNOSIS — J939 Pneumothorax, unspecified: Secondary | ICD-10-CM | POA: Diagnosis present

## 2014-12-29 NOTE — ED Notes (Signed)
2nd vial of Versed pulled for sedation was not used, top was popped off of vial, vial was discarded. Verified with Nolon Stalls RN.

## 2015-01-26 ENCOUNTER — Telehealth: Payer: Self-pay | Admitting: Pulmonary Disease

## 2015-01-26 NOTE — Telephone Encounter (Signed)
lmtcb x1 

## 2015-01-27 NOTE — Telephone Encounter (Signed)
Spoke with [employer] who wanted to confirm per paper faxed back in regards to pt's O2 use needs a f/u appt. comfirmed pt needs appt. States she will contact pt and have them to setup appt. Nothing further needed.

## 2015-04-28 ENCOUNTER — Telehealth: Payer: Self-pay | Admitting: Emergency Medicine

## 2015-04-28 NOTE — Telephone Encounter (Signed)
Left message for sister to call back

## 2015-04-28 NOTE — Telephone Encounter (Signed)
Spoke with pt's sister, states that [facility] is in prison-has a POC and that's his only 02 source.  Sister states that he has complained about feeling sick, almost passing out, and like his 1 is too low.  Per sister, pt is wearing 3lpm 24/7.  Pt's sister is requesting a letter stating pt's diagnosis and need for reliable continuous 02.  Pt's sister states she will pick this letter up in the office when/if it is completed.    RB are you ok with this letter being typed up?  Thanks!

## 2015-04-28 NOTE — Telephone Encounter (Signed)
I think what really needs to happen is that the medical staff / system at the prison will need to perform a walking oximetry and titration to determine how much he needs. If they need me to order this or order his new dose O2 then I am happy to. I don't know how to make this happen, have never needed to do this before. Probably need family to inquire w the prison. If they need a letter from [place] stating his medical dx and his need for O2 then go ahead and write this. May get the process started

## 2015-04-29 NOTE — Telephone Encounter (Signed)
lmtcb X2 for pt's sister.

## 2015-04-30 NOTE — Telephone Encounter (Signed)
Patient's sister, Joni Reining, [phone] returned Elise's call.

## 2015-04-30 NOTE — Telephone Encounter (Signed)
lmtcb for pt's sister, Zora.

## 2015-04-30 NOTE — Telephone Encounter (Signed)
Called and spoke with pt's sister, Joni Reining.  She said that she needs [place] to fax records to Dr. Oletta Lamas.  Dr. Oletta Lamas is preparing a packet of medical information to send to the prison so pt can get his medications and oxygen that he needs.  Pt is currently only getting aspirin, no other meds and is having hard time breathing.  Pt is supposed to be on 3L continuous oxygen 24/7 but was given a POC that is pulse not continuous.  Printed out last OV note and Last Xray note and faxed to Dr. Oletta Lamas.  Pts sister is checking with the prison to see if they can perform ONO or Walk Oximetry on pt and will call back to confirm what she finds out.  FYI to [place]

## 2015-04-30 NOTE — Telephone Encounter (Signed)
Thanks

## 2015-04-30 NOTE — Telephone Encounter (Signed)
Pt sister cb [phone]

## 2016-02-23 ENCOUNTER — Encounter: Payer: Self-pay | Admitting: Emergency Medicine

## 2016-02-23 ENCOUNTER — Ambulatory Visit (INDEPENDENT_AMBULATORY_CARE_PROVIDER_SITE_OTHER)
Admission: RE | Admit: 2016-02-23 | Discharge: 2016-02-23 | Disposition: A | Discharge status: Home / Self Care | Payer: Medicaid Other | Source: Ambulatory Visit | Attending: Emergency Medicine | Admitting: Emergency Medicine

## 2016-02-23 ENCOUNTER — Ambulatory Visit (INDEPENDENT_AMBULATORY_CARE_PROVIDER_SITE_OTHER): Payer: Medicaid Other | Admitting: Emergency Medicine

## 2016-02-23 VITALS — BP 126/88 | HR 95 | Ht 75.0 in | Wt 217.0 lb

## 2016-02-23 DIAGNOSIS — J449 Chronic obstructive pulmonary disease, unspecified: Secondary | ICD-10-CM | POA: Diagnosis not present

## 2016-02-23 MED ORDER — UMECLIDINIUM-VILANTEROL 62.5-25 MCG/INH IN AEPB: 1.0000 | INHALATION_SPRAY | Freq: Every day | RESPIRATORY_TRACT | 5 refills | Status: DC

## 2016-02-23 NOTE — Assessment & Plan Note (Signed)
CXR today We will restart Anoro 1 inhalation once a day Stop Dulera for now Keep albuterol available to use 2 puffs as needed for shortness of breath.  Try to work on decreasing your cigarettes. We will revisit stopping in the future.  We will continue oxygen at 3L/min. Will discuss with [employer] whether you can get a home concentrator in addition to your portable device.  Follow with Dr Lamonte Sakai in 2 months or sooner if you have any problems.

## 2016-02-23 NOTE — Progress Notes (Signed)
Patient ID: Sean Thomas, male   DOB: 1961-04-13, 55 y.o.   MRN: [id number] Patient seen in the office today and instructed on use of anoro ellipta.  Patient expressed understanding and demonstrated technique.
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
## 2016-02-23 NOTE — Patient Instructions (Addendum)
CXR today We will restart Anoro 1 inhalation once a day Stop Dulera for now Keep albuterol available to use 2 puffs as needed for shortness of breath.  Try to work on decreasing your cigarettes. We will revisit stopping in the future.  We will continue oxygen at 3L/min. Will discuss with [employer] whether you can get a home concentrator in addition to your portable device.  Follow with Dr Lamonte Sakai in 2 months or sooner if you have any problems.

## 2016-02-24 ENCOUNTER — Telehealth: Payer: Self-pay | Admitting: Emergency Medicine

## 2016-02-24 MED ORDER — BENZONATATE 200 MG PO CAPS
200.0000 mg | ORAL_CAPSULE | Freq: Three times a day (TID) | ORAL | 0 refills | Status: DC | PRN
Start: 2016-02-24 — End: 2016-05-23

## 2016-02-24 MED ORDER — PREDNISONE 10 MG PO TABS: ORAL_TABLET | ORAL | 0 refills | Status: DC

## 2016-02-24 NOTE — Telephone Encounter (Signed)
Recent CXR shows no new or active process. Old rib fractures.  For cough- offer prednisone 10 mg, # 20, 4 X 2 DAYS, 3 X 2 DAYS, 2 X 2 DAYS, 1 X 2 DAYS                     Benzonatate 200 mg, # 30, 1 three times daily if needed for cough

## 2016-02-24 NOTE — Telephone Encounter (Signed)
Ok to speak to his sister Collie Siad or his mother his other sister died and he has trouble remembering

## 2016-02-24 NOTE — Telephone Encounter (Signed)
Called and spoke to pt's mother. Informed her of the results and recs per CY. Rx sent to preferred pharmacy. Pt's mother verbalized understanding and denied any further questions or concerns at this time.

## 2016-02-24 NOTE — Telephone Encounter (Signed)
Spoke with pt he states he has been coughing so hard that it is causing him to vomit X1d. He is using his inhalers with no relief. Last OV with RB 02/23/16 pt had cxr at ov. Advised pt that as soon as RB addresses CXR results we will call him.   CY please advise, as RB is working 11pm e-link.  Allergies  Allergen Reactions  . Advil [Ibuprofen] Diarrhea  . Aleve [Naproxen Sodium] Nausea And Vomiting    diarrhea   Current Outpatient Prescriptions on File Prior to Visit  Medication Sig Dispense Refill  . albuterol (PROVENTIL HFA;VENTOLIN HFA) 108 (90 Base) MCG/ACT inhaler Inhale 2 puffs into the lungs every 6 (six) hours as needed for wheezing or shortness of breath.    . ALPRAZolam (XANAX) 0.5 MG tablet Take 0.5 mg by mouth at bedtime as needed for sleep.    Marland Kitchen amitriptyline (ELAVIL) 10 MG tablet Take 10 mg by mouth at bedtime.    Marland Kitchen aspirin EC 81 MG tablet Take 81 mg by mouth daily.    . cyclobenzaprine (FLEXERIL) 10 MG tablet Take 10 mg by mouth 3 (three) times daily.    Marland Kitchen gabapentin (NEURONTIN) 600 MG tablet Take 600 mg by mouth 3 (three) times daily.    . hydrochlorothiazide (HYDRODIURIL) 25 MG tablet Take 25 mg by mouth daily.    . mometasone-formoterol (DULERA) 100-5 MCG/ACT AERO Inhale 2 puffs into the lungs 2 (two) times daily.    Marland Kitchen omeprazole (PRILOSEC) 20 MG capsule Take 40 mg by mouth daily.    . ondansetron (ZOFRAN) 4 MG tablet Take 4 mg by mouth every 8 (eight) hours as needed for nausea or vomiting.    Marland Kitchen oxyCODONE-acetaminophen (PERCOCET) 7.5-325 MG per tablet Take 1 tablet by mouth every 4 (four) hours as needed for severe pain. 20 tablet 0  . pravastatin (PRAVACHOL) 40 MG tablet Take 40 mg by mouth daily.    . traZODone (DESYREL) 100 MG tablet Take 100 mg by mouth at bedtime.    Marland Kitchen umeclidinium-vilanterol (ANORO ELLIPTA) 62.5-25 MCG/INH AEPB Inhale 1 puff into the lungs daily. 60 each 5   No current facility-administered medications on file prior to visit.

## 2016-02-25 ENCOUNTER — Encounter (HOSPITAL_COMMUNITY): Payer: Self-pay | Admitting: Emergency Medicine

## 2016-02-25 ENCOUNTER — Ambulatory Visit: Payer: Medicaid Other | Admitting: Emergency Medicine

## 2016-02-25 ENCOUNTER — Inpatient Hospital Stay (HOSPITAL_COMMUNITY)
Admission: EM | Admit: 2016-02-25 | Discharge: 2016-03-01 | DRG: 190 | Disposition: A | Discharge status: Home / Self Care | Payer: Medicaid Other | Attending: Internal Medicine | Admitting: Internal Medicine

## 2016-02-25 ENCOUNTER — Emergency Department (HOSPITAL_COMMUNITY): Payer: Medicaid Other

## 2016-02-25 DIAGNOSIS — G894 Chronic pain syndrome: Secondary | ICD-10-CM | POA: Diagnosis present

## 2016-02-25 DIAGNOSIS — R739 Hyperglycemia, unspecified: Secondary | ICD-10-CM | POA: Diagnosis present

## 2016-02-25 DIAGNOSIS — K219 Gastro-esophageal reflux disease without esophagitis: Secondary | ICD-10-CM | POA: Diagnosis present

## 2016-02-25 DIAGNOSIS — J988 Other specified respiratory disorders: Secondary | ICD-10-CM

## 2016-02-25 DIAGNOSIS — F319 Bipolar disorder, unspecified: Secondary | ICD-10-CM | POA: Diagnosis present

## 2016-02-25 DIAGNOSIS — R0602 Shortness of breath: Secondary | ICD-10-CM

## 2016-02-25 DIAGNOSIS — R059 Cough, unspecified: Secondary | ICD-10-CM

## 2016-02-25 DIAGNOSIS — J9621 Acute and chronic respiratory failure with hypoxia: Secondary | ICD-10-CM | POA: Diagnosis not present

## 2016-02-25 DIAGNOSIS — Z9981 Dependence on supplemental oxygen: Secondary | ICD-10-CM

## 2016-02-25 DIAGNOSIS — J441 Chronic obstructive pulmonary disease with (acute) exacerbation: Principal | ICD-10-CM | POA: Diagnosis present

## 2016-02-25 DIAGNOSIS — F17210 Nicotine dependence, cigarettes, uncomplicated: Secondary | ICD-10-CM | POA: Diagnosis present

## 2016-02-25 DIAGNOSIS — G8929 Other chronic pain: Secondary | ICD-10-CM | POA: Diagnosis not present

## 2016-02-25 DIAGNOSIS — R05 Cough: Secondary | ICD-10-CM

## 2016-02-25 DIAGNOSIS — T380X5A Adverse effect of glucocorticoids and synthetic analogues, initial encounter: Secondary | ICD-10-CM | POA: Diagnosis present

## 2016-02-25 DIAGNOSIS — I10 Essential (primary) hypertension: Secondary | ICD-10-CM | POA: Diagnosis present

## 2016-02-25 DIAGNOSIS — Z833 Family history of diabetes mellitus: Secondary | ICD-10-CM

## 2016-02-25 DIAGNOSIS — E785 Hyperlipidemia, unspecified: Secondary | ICD-10-CM | POA: Diagnosis present

## 2016-02-25 DIAGNOSIS — R7309 Other abnormal glucose: Secondary | ICD-10-CM | POA: Diagnosis not present

## 2016-02-25 LAB — COMPREHENSIVE METABOLIC PANEL
ALBUMIN: 3.6 g/dL (ref 3.5–5.0)
ALK PHOS: 51 U/L (ref 38–126)
ALT: 11 U/L — AB (ref 17–63)
ANION GAP: 7 (ref 5–15)
AST: 15 U/L (ref 15–41)
BILIRUBIN TOTAL: 0.4 mg/dL (ref 0.3–1.2)
BUN: 19 mg/dL (ref 6–20)
CHLORIDE: 102 mmol/L (ref 101–111)
CO2: 26 mmol/L (ref 22–32)
Calcium: 8.4 mg/dL — ABNORMAL LOW (ref 8.9–10.3)
Creatinine, Ser: 1.14 mg/dL (ref 0.61–1.24)
GFR calc Af Amer: >60 mL/min (ref >60)
GFR calc non Af Amer: >60 mL/min (ref >60)
GLUCOSE: 182 mg/dL — AB (ref 65–99)
POTASSIUM: 3.6 mmol/L (ref 3.5–5.1)
SODIUM: 135 mmol/L (ref 135–145)
Total Protein: 7.1 g/dL (ref 6.5–8.1)

## 2016-02-25 LAB — URINALYSIS, ROUTINE W REFLEX MICROSCOPIC
Bilirubin Urine: NEGATIVE
GLUCOSE, UA: NEGATIVE mg/dL
Hgb urine dipstick: NEGATIVE
LEUKOCYTES UA: NEGATIVE
Nitrite: NEGATIVE
PH: 6.5 (ref 5.0–8.0)
Protein, ur: 30 mg/dL — AB
SPECIFIC GRAVITY, URINE: 1.01 (ref 1.005–1.030)

## 2016-02-25 LAB — CBC WITH DIFFERENTIAL/PLATELET
Basophils Absolute: 0 10*3/uL (ref 0.0–0.1)
Basophils Relative: 0 %
Eosinophils Absolute: 0 10*3/uL (ref 0.0–0.7)
Eosinophils Relative: 0 %
HEMATOCRIT: 32.8 % — AB (ref 39.0–52.0)
Hemoglobin: 9.5 g/dL — ABNORMAL LOW (ref 13.0–17.0)
LYMPHS PCT: 10 %
Lymphs Abs: 2.2 10*3/uL (ref 0.7–4.0)
MCH: 21.2 pg — ABNORMAL LOW (ref 26.0–34.0)
MCHC: 29 g/dL — AB (ref 30.0–36.0)
MCV: 73.2 fL — AB (ref 78.0–100.0)
MONOS PCT: 5 %
Monocytes Absolute: 1.1 10*3/uL — ABNORMAL HIGH (ref 0.1–1.0)
NEUTROS ABS: 20.3 10*3/uL — AB (ref 1.7–7.7)
Neutrophils Relative %: 86 %
Platelets: 280 10*3/uL (ref 150–400)
RBC: 4.48 MIL/uL (ref 4.22–5.81)
RDW: 15.7 % — AB (ref 11.5–15.5)
WBC: 23.7 10*3/uL — ABNORMAL HIGH (ref 4.0–10.5)

## 2016-02-25 LAB — URINE MICROSCOPIC-ADD ON: RBC / HPF: NONE SEEN RBC/hpf (ref 0–5)

## 2016-02-25 LAB — GLUCOSE, CAPILLARY: GLUCOSE-CAPILLARY: 296 mg/dL — AB (ref 65–99)

## 2016-02-25 LAB — I-STAT CG4 LACTIC ACID, ED: Lactic Acid, Venous: 1.24 mmol/L (ref 0.5–1.9)

## 2016-02-25 LAB — PROCALCITONIN: PROCALCITONIN: 0.75 ng/mL

## 2016-02-25 MED ORDER — TRAZODONE HCL 50 MG PO TABS
100.0000 mg | ORAL_TABLET | Freq: Every day | ORAL | Status: DC
  Administered 2016-02-25 – 2016-02-29 (×5): 100 mg via ORAL
  Filled 2016-02-25 (×5): qty 2

## 2016-02-25 MED ORDER — GUAIFENESIN ER 600 MG PO TB12
600.0000 mg | ORAL_TABLET | Freq: Two times a day (BID) | ORAL | Status: DC
  Administered 2016-02-25 – 2016-03-01 (×10): 600 mg via ORAL
  Filled 2016-02-25 (×10): qty 1

## 2016-02-25 MED ORDER — OXYCODONE-ACETAMINOPHEN 7.5-325 MG PO TABS
1.0000 | ORAL_TABLET | Freq: Four times a day (QID) | ORAL | Status: DC | PRN
  Administered 2016-02-25 – 2016-03-01 (×13): 1 via ORAL
  Filled 2016-02-25 (×13): qty 1

## 2016-02-25 MED ORDER — PRAVASTATIN SODIUM 40 MG PO TABS
40.0000 mg | ORAL_TABLET | Freq: Every day | ORAL | Status: DC
  Administered 2016-02-25 – 2016-02-29 (×5): 40 mg via ORAL
  Filled 2016-02-25 (×5): qty 1

## 2016-02-25 MED ORDER — ALBUTEROL SULFATE (2.5 MG/3ML) 0.083% IN NEBU
2.5000 mg | INHALATION_SOLUTION | RESPIRATORY_TRACT | Status: DC | PRN
  Administered 2016-02-25 – 2016-02-26 (×2): 2.5 mg via RESPIRATORY_TRACT
  Filled 2016-02-25 (×3): qty 3

## 2016-02-25 MED ORDER — IPRATROPIUM-ALBUTEROL 0.5-2.5 (3) MG/3ML IN SOLN
3.0000 mL | Freq: Four times a day (QID) | RESPIRATORY_TRACT | Status: DC
  Administered 2016-02-25 – 2016-03-01 (×17): 3 mL via RESPIRATORY_TRACT
  Filled 2016-02-25 (×18): qty 3

## 2016-02-25 MED ORDER — ALPRAZOLAM 0.5 MG PO TABS
0.5000 mg | ORAL_TABLET | Freq: Every evening | ORAL | Status: DC | PRN
  Administered 2016-02-25 – 2016-02-29 (×5): 0.5 mg via ORAL
  Filled 2016-02-25 (×5): qty 1

## 2016-02-25 MED ORDER — CEFTRIAXONE SODIUM 1 G IJ SOLR
1.0000 g | Freq: Once | INTRAMUSCULAR | Status: AC
  Administered 2016-02-25: 1 g via INTRAVENOUS
  Filled 2016-02-25: qty 10

## 2016-02-25 MED ORDER — ACETAMINOPHEN 650 MG RE SUPP
650.0000 mg | Freq: Four times a day (QID) | RECTAL | Status: DC | PRN
Start: 2016-02-25 — End: 2016-03-01

## 2016-02-25 MED ORDER — ASPIRIN EC 81 MG PO TBEC
81.0000 mg | DELAYED_RELEASE_TABLET | Freq: Every day | ORAL | Status: DC
  Administered 2016-02-25 – 2016-03-01 (×6): 81 mg via ORAL
  Filled 2016-02-25 (×6): qty 1

## 2016-02-25 MED ORDER — METHYLPREDNISOLONE SODIUM SUCC 125 MG IJ SOLR
125.0000 mg | Freq: Once | INTRAMUSCULAR | Status: AC
  Administered 2016-02-25: 125 mg via INTRAVENOUS
  Filled 2016-02-25: qty 2

## 2016-02-25 MED ORDER — ONDANSETRON HCL 4 MG/2ML IJ SOLN: 4.0000 mg | Freq: Four times a day (QID) | INTRAMUSCULAR | Status: DC | PRN

## 2016-02-25 MED ORDER — ONDANSETRON HCL 4 MG PO TABS: 4.0000 mg | ORAL_TABLET | Freq: Four times a day (QID) | ORAL | Status: DC | PRN

## 2016-02-25 MED ORDER — GABAPENTIN 300 MG PO CAPS
600.0000 mg | ORAL_CAPSULE | Freq: Three times a day (TID) | ORAL | Status: DC
  Administered 2016-02-25 – 2016-03-01 (×14): 600 mg via ORAL
  Filled 2016-02-25 (×14): qty 2

## 2016-02-25 MED ORDER — PANTOPRAZOLE SODIUM 40 MG PO TBEC
40.0000 mg | DELAYED_RELEASE_TABLET | Freq: Every day | ORAL | Status: DC
  Administered 2016-02-25 – 2016-03-01 (×6): 40 mg via ORAL
  Filled 2016-02-25 (×6): qty 1

## 2016-02-25 MED ORDER — MOMETASONE FURO-FORMOTEROL FUM 100-5 MCG/ACT IN AERO
2.0000 | INHALATION_SPRAY | Freq: Two times a day (BID) | RESPIRATORY_TRACT | Status: DC
  Administered 2016-02-26: 2 via RESPIRATORY_TRACT
  Filled 2016-02-25: qty 8.8

## 2016-02-25 MED ORDER — ONDANSETRON HCL 4 MG PO TABS: 4.0000 mg | ORAL_TABLET | Freq: Three times a day (TID) | ORAL | Status: DC | PRN

## 2016-02-25 MED ORDER — METHYLPREDNISOLONE SODIUM SUCC 125 MG IJ SOLR
60.0000 mg | Freq: Two times a day (BID) | INTRAMUSCULAR | Status: DC
  Administered 2016-02-26: 60 mg via INTRAVENOUS
  Filled 2016-02-25: qty 2

## 2016-02-25 MED ORDER — ENOXAPARIN SODIUM 40 MG/0.4ML ~~LOC~~ SOLN
40.0000 mg | SUBCUTANEOUS | Status: DC
  Administered 2016-02-25 – 2016-02-29 (×5): 40 mg via SUBCUTANEOUS
  Filled 2016-02-25 (×5): qty 0.4

## 2016-02-25 MED ORDER — CYCLOBENZAPRINE HCL 10 MG PO TABS
10.0000 mg | ORAL_TABLET | Freq: Three times a day (TID) | ORAL | Status: DC
  Administered 2016-02-25 – 2016-03-01 (×14): 10 mg via ORAL
  Filled 2016-02-25 (×14): qty 1

## 2016-02-25 MED ORDER — ALBUTEROL SULFATE (2.5 MG/3ML) 0.083% IN NEBU
5.0000 mg | INHALATION_SOLUTION | Freq: Once | RESPIRATORY_TRACT | Status: AC
  Administered 2016-02-25: 5 mg via RESPIRATORY_TRACT
  Filled 2016-02-25: qty 6

## 2016-02-25 MED ORDER — INSULIN ASPART 100 UNIT/ML ~~LOC~~ SOLN
0.0000 [IU] | Freq: Three times a day (TID) | SUBCUTANEOUS | Status: DC
  Administered 2016-02-26: 15 [IU] via SUBCUTANEOUS
  Administered 2016-02-26 – 2016-02-27 (×3): 4 [IU] via SUBCUTANEOUS
  Administered 2016-02-27: 7 [IU] via SUBCUTANEOUS
  Administered 2016-02-28: 4 [IU] via SUBCUTANEOUS
  Administered 2016-02-28: 3 [IU] via SUBCUTANEOUS
  Administered 2016-02-29: 4 [IU] via SUBCUTANEOUS
  Administered 2016-02-29: 3 [IU] via SUBCUTANEOUS
  Administered 2016-02-29: 7 [IU] via SUBCUTANEOUS
  Administered 2016-03-01: 3 [IU] via SUBCUTANEOUS

## 2016-02-25 MED ORDER — ALBUTEROL SULFATE (2.5 MG/3ML) 0.083% IN NEBU: 2.5000 mg | INHALATION_SOLUTION | Freq: Four times a day (QID) | RESPIRATORY_TRACT | Status: DC

## 2016-02-25 MED ORDER — DEXTROSE 5 % IV SOLN
500.0000 mg | Freq: Once | INTRAVENOUS | Status: AC
  Administered 2016-02-25: 500 mg via INTRAVENOUS
  Filled 2016-02-25: qty 500

## 2016-02-25 MED ORDER — AMITRIPTYLINE HCL 10 MG PO TABS
10.0000 mg | ORAL_TABLET | Freq: Every day | ORAL | Status: DC
  Administered 2016-02-25 – 2016-02-29 (×5): 10 mg via ORAL
  Filled 2016-02-25 (×5): qty 1

## 2016-02-25 MED ORDER — PNEUMOCOCCAL VAC POLYVALENT 25 MCG/0.5ML IJ INJ
0.5000 mL | INJECTION | INTRAMUSCULAR | Status: AC
  Administered 2016-02-29: 0.5 mL via INTRAMUSCULAR
  Filled 2016-02-25: qty 0.5

## 2016-02-25 MED ORDER — ACETAMINOPHEN 325 MG PO TABS
650.0000 mg | ORAL_TABLET | Freq: Four times a day (QID) | ORAL | Status: DC | PRN
Start: 2016-02-25 — End: 2016-03-01

## 2016-02-25 MED ORDER — IPRATROPIUM BROMIDE 0.02 % IN SOLN: 0.5000 mg | Freq: Four times a day (QID) | RESPIRATORY_TRACT | Status: DC

## 2016-02-25 MED ORDER — MOMETASONE FURO-FORMOTEROL FUM 100-5 MCG/ACT IN AERO
INHALATION_SPRAY | RESPIRATORY_TRACT | Status: AC
Start: 2016-02-25 — End: 2016-02-25
  Filled 2016-02-25: qty 8.8

## 2016-02-25 MED ORDER — POLYETHYLENE GLYCOL 3350 17 G PO PACK: 17.0000 g | PACK | Freq: Every day | ORAL | Status: DC | PRN

## 2016-02-25 MED ORDER — IPRATROPIUM BROMIDE 0.02 % IN SOLN
0.5000 mg | Freq: Once | RESPIRATORY_TRACT | Status: AC
  Administered 2016-02-25: 0.5 mg via RESPIRATORY_TRACT
  Filled 2016-02-25: qty 2.5

## 2016-02-25 MED ORDER — INSULIN ASPART 100 UNIT/ML ~~LOC~~ SOLN
0.0000 [IU] | Freq: Every day | SUBCUTANEOUS | Status: DC
  Administered 2016-02-25: 3 [IU] via SUBCUTANEOUS
  Administered 2016-02-28 – 2016-02-29 (×2): 2 [IU] via SUBCUTANEOUS

## 2016-02-25 MED ORDER — CETYLPYRIDINIUM CHLORIDE 0.05 % MT LIQD
7.0000 mL | Freq: Two times a day (BID) | OROMUCOSAL | Status: DC
  Administered 2016-02-26 – 2016-02-29 (×9): 7 mL via OROMUCOSAL

## 2016-02-25 MED ORDER — HYDROCHLOROTHIAZIDE 25 MG PO TABS
25.0000 mg | ORAL_TABLET | Freq: Every day | ORAL | Status: DC
  Administered 2016-02-26 – 2016-03-01 (×5): 25 mg via ORAL
  Filled 2016-02-25 (×5): qty 1

## 2016-02-25 NOTE — ED Notes (Signed)
Patient refused to wear O2 on way to room.

## 2016-02-25 NOTE — ED Provider Notes (Addendum)
[facility] DEPT Provider Note   CSN: [id number] Arrival date & time: 02/25/16  1524  First Provider Contact:  First MD Initiated Contact with Patient 02/25/16 1550        History   Chief Complaint Chief Complaint  Patient presents with  . Cough  . Emesis    HPI JEROL ELZA is a 55 y.o. male.  Patient c/o productive cough, and increased sob for the past 2 days. States recently saw pcp and pulmonary with same - no specific dx. Indicates was given 2 breathing txs, and states took his first dose prednisone yesterday from new rx. Denies recent abx use.  Hx copd, +smoker, uses home o2 3-4 liters at home. Cough prod of brownish sputum. No sore throat or runny nose. t 99.7 in ED, denies fevers at home. No chest pain or discomfort. No leg pain or swelling. States when coughs up stuff makes him gag, and indicates brownish emesis, but family feels is more related to coughing.      The history is provided by the patient.  Cough  Associated symptoms include shortness of breath. Pertinent negatives include no chest pain, no chills, no headaches, no sore throat and no eye redness.  Emesis   Associated symptoms include cough. Pertinent negatives include no abdominal pain, no chills, no diarrhea and no headaches.    Past Medical History:  Diagnosis Date  . Cervical pain 11/21/2012  . COPD (chronic obstructive pulmonary disease) ([place])   . DDD (degenerative disc disease)   . Fracture of multiple ribs 11/01/2012  . Fracture of occipital condyle ([place]) 11/21/2012  . Headache   . Hyperlipidemia   . Hypertension   . Inguinal hernia 12/19/2013  . Shortness of breath dyspnea     Patient Active Problem List   Diagnosis Date Noted  . Malnutrition of moderate degree ([place]) 12/21/2014  . Pneumothorax on right 12/20/2014  . Rib fractures 12/20/2014  . Pneumothorax, right 12/20/2014  . COPD (chronic obstructive pulmonary disease) ([place]) 12/19/2013  . Chest pain 12/19/2013  . Inguinal hernia  12/19/2013  . Fracture of occipital condyle ([place]) 11/21/2012  . Cervical pain 11/21/2012  . Fracture of multiple ribs 11/01/2012    Past Surgical History:  Procedure Laterality Date  . BACK SURGERY    . INGUINAL HERNIA REPAIR Left 07/24/2014   Procedure: LAPAROSCOPIC LEFT INGUINAL HERNIA REPAIR ;  Surgeon: Ralene Ok, MD;  Location: [facility];  Service: General;  Laterality: Left;  . INSERTION OF MESH Left 07/24/2014   Procedure: INSERTION OF MESH;  Surgeon: Ralene Ok, MD;  Location: [facility];  Service: General;  Laterality: Left;  . LUMBAR FUSION         Home Medications    Prior to Admission medications   Medication Sig Start Date End Date Taking? Authorizing Provider  albuterol (PROVENTIL HFA;VENTOLIN HFA) 108 (90 Base) MCG/ACT inhaler Inhale 2 puffs into the lungs every 6 (six) hours as needed for wheezing or shortness of breath.    Historical Provider, MD  ALPRAZolam Duanne Moron) 0.5 MG tablet Take 0.5 mg by mouth at bedtime as needed for sleep.    Historical Provider, MD  amitriptyline (ELAVIL) 10 MG tablet Take 10 mg by mouth at bedtime.    Historical Provider, MD  aspirin EC 81 MG tablet Take 81 mg by mouth daily.    Historical Provider, MD  benzonatate (TESSALON) 200 MG capsule Take 1 capsule (200 mg total) by mouth 3 (three) times daily as needed for cough. 02/24/16  Deneise Lever, MD  cyclobenzaprine (FLEXERIL) 10 MG tablet Take 10 mg by mouth 3 (three) times daily.    Historical Provider, MD  gabapentin (NEURONTIN) 600 MG tablet Take 600 mg by mouth 3 (three) times daily.    Historical Provider, MD  hydrochlorothiazide (HYDRODIURIL) 25 MG tablet Take 25 mg by mouth daily.    Historical Provider, MD  mometasone-formoterol (DULERA) 100-5 MCG/ACT AERO Inhale 2 puffs into the lungs 2 (two) times daily.    Historical Provider, MD  omeprazole (PRILOSEC) 20 MG capsule Take 40 mg by mouth daily.    Historical Provider, MD  ondansetron (ZOFRAN) 4 MG tablet Take 4 mg by mouth every 8  (eight) hours as needed for nausea or vomiting.    Historical Provider, MD  oxyCODONE-acetaminophen (PERCOCET) 7.5-325 MG per tablet Take 1 tablet by mouth every 4 (four) hours as needed for severe pain. 12/24/14   Orvan Falconer, MD  pravastatin (PRAVACHOL) 40 MG tablet Take 40 mg by mouth daily.    Historical Provider, MD  predniSONE (DELTASONE) 10 MG tablet Take 4 tab for 2 days, 3 tab for 2 days, 2 tab for 2 tabs, 1 tab for 2 days and then stop 02/24/16   Deneise Lever, MD  traZODone (DESYREL) 100 MG tablet Take 100 mg by mouth at bedtime.    Historical Provider, MD  umeclidinium-vilanterol (ANORO ELLIPTA) 62.5-25 MCG/INH AEPB Inhale 1 puff into the lungs daily. 02/23/16   Collene Gobble, MD    Family History Family History  Problem Relation Age of Onset  . Diabetes Mother     Social History Social History  Substance Use Topics  . Smoking status: Current Every Day Smoker    Packs/day: 0.25    Years: 37.00    Types: Cigarettes  . Smokeless tobacco: Never Used  . Alcohol use Yes     Comment: occasionally     Allergies   Advil [ibuprofen] and Aleve [naproxen sodium]   Review of Systems Review of Systems  Constitutional: Negative for chills and diaphoresis.  HENT: Negative for sore throat.   Eyes: Negative for redness.  Respiratory: Positive for cough and shortness of breath.   Cardiovascular: Negative for chest pain and leg swelling.  Gastrointestinal: Negative for abdominal pain and diarrhea.  Genitourinary: Negative for flank pain.  Musculoskeletal: Negative for back pain and neck pain.  Skin: Negative for rash.  Neurological: Negative for headaches.  Hematological: Does not bruise/bleed easily.  Psychiatric/Behavioral: Negative for confusion.     Physical Exam Updated Vital Signs BP 123/75 (BP Location: Left Arm)   Pulse 120   Temp 99.7 F (37.6 C) (Oral)   Resp 18   Ht 6\' 3"  (1.905 m)   Wt 98 kg   SpO2 93%   BMI 27.00 kg/m   Physical Exam  Constitutional: He  is oriented to person, place, and time. He appears well-developed and well-nourished. No distress.  HENT:  Head: Atraumatic.  Nose: Nose normal.  Mouth/Throat: Oropharynx is clear and moist.  Eyes: Pupils are equal, round, and reactive to light.  Neck: Neck supple. No tracheal deviation present.  Cardiovascular: Normal rate, normal heart sounds and intact distal pulses.  Exam reveals no gallop and no friction rub.   No murmur heard. Tachycardic.   Pulmonary/Chest: Effort normal. No accessory muscle usage. No respiratory distress.  Decreased bs bilaterally. Rhonchi.   Abdominal: Soft. He exhibits no distension. There is no tenderness.  Genitourinary:  Genitourinary Comments: No cva tenderness.  Light brown  stool on rectal exam, no melena.   Musculoskeletal: Normal range of motion. He exhibits no edema or tenderness.  Neurological: He is alert and oriented to person, place, and time.  Skin: Skin is warm and dry. No rash noted.  Psychiatric: He has a normal mood and affect.  Nursing note and vitals reviewed.    ED Treatments / Results  Labs (all labs ordered are listed, but only abnormal results are displayed) Results for orders placed or performed during the hospital encounter of 02/25/16  Comprehensive metabolic panel  Result Value Ref Range   Sodium 135 135 - 145 mmol/L   Potassium 3.6 3.5 - 5.1 mmol/L   Chloride 102 101 - 111 mmol/L   CO2 26 22 - 32 mmol/L   Glucose, Bld 182 (H) 65 - 99 mg/dL   BUN 19 6 - 20 mg/dL   Creatinine, Ser 1.14 0.61 - 1.24 mg/dL   Calcium 8.4 (L) 8.9 - 10.3 mg/dL   Total Protein 7.1 6.5 - 8.1 g/dL   Albumin 3.6 3.5 - 5.0 g/dL   AST 15 15 - 41 U/L   ALT 11 (L) 17 - 63 U/L   Alkaline Phosphatase 51 38 - 126 U/L   Total Bilirubin 0.4 0.3 - 1.2 mg/dL   GFR calc non Af Amer >60 >60 mL/min   GFR calc Af Amer >60 >60 mL/min   Anion gap 7 5 - 15  CBC WITH DIFFERENTIAL  Result Value Ref Range   WBC 23.7 (H) 4.0 - 10.5 K/uL   RBC 4.48 4.22 - 5.81  MIL/uL   Hemoglobin 9.5 (L) 13.0 - 17.0 g/dL   HCT 32.8 (L) 39.0 - 52.0 %   MCV 73.2 (L) 78.0 - 100.0 fL   MCH 21.2 (L) 26.0 - 34.0 pg   MCHC 29.0 (L) 30.0 - 36.0 g/dL   RDW 15.7 (H) 11.5 - 15.5 %   Platelets 280 150 - 400 K/uL   Neutrophils Relative % 86 %   Neutro Abs 20.3 (H) 1.7 - 7.7 K/uL   Lymphocytes Relative 10 %   Lymphs Abs 2.2 0.7 - 4.0 K/uL   Monocytes Relative 5 %   Monocytes Absolute 1.1 (H) 0.1 - 1.0 K/uL   Eosinophils Relative 0 %   Eosinophils Absolute 0.0 0.0 - 0.7 K/uL   Basophils Relative 0 %   Basophils Absolute 0.0 0.0 - 0.1 K/uL  I-Stat CG4 Lactic Acid, ED  (not at  [facility])  Result Value Ref Range   Lactic Acid, Venous 1.24 0.5 - 1.9 mmol/L   Dg Chest 2 View  Result Date: 02/25/2016 CLINICAL DATA:  Shortness of breath, productive cough. EXAM: CHEST  2 VIEW COMPARISON:  Radiographs of December 24, 2015. FINDINGS: The heart size and mediastinal contours are within normal limits. Both lungs are clear. No pneumothorax or pleural effusion is noted. Old severely displaced left rib fractures are again noted. IMPRESSION: No active cardiopulmonary disease. Electronically Signed   By: Marijo Conception, M.D.   On: 02/25/2016 16:54   Dg Chest 2 View  Result Date: 02/24/2016 CLINICAL DATA:  Cough and congestion. EXAM: CHEST  2 VIEW COMPARISON:  12/29/2014. FINDINGS: Cardiomegaly with normal pulmonary vascularity. No focal infiltrate. No pleural effusion or pneumothorax. Right basilar pleural thickening noted consistent scarring. Small sliding hiatal hernia. Old bilateral rib fractures noted. IMPRESSION: 1.  No acute cardiopulmonary disease. 2. Small sliding hiatal hernia . 3.  Old bilateral rib fractures.  No pneumothorax. Electronically Signed   By: Marcello Moores  Register   On: 02/24/2016 08:10    EKG  EKG Interpretation  Date/Time:  Friday February 25 2016 15:40:55 EDT Ventricular Rate:  120 PR Interval:  160 QRS Duration: 92 QT Interval:  306 QTC Calculation: 432 R  Axis:   29 Text Interpretation:  Sinus tachycardia Incomplete right bundle branch block Nonspecific ST abnormality Confirmed by Ashok Cordia  MD, Lennette Bihari ([id number]) on 02/25/2016 3:53:40 PM       Radiology Dg Chest 2 View  Result Date: 02/24/2016 CLINICAL DATA:  Cough and congestion. EXAM: CHEST  2 VIEW COMPARISON:  12/29/2014. FINDINGS: Cardiomegaly with normal pulmonary vascularity. No focal infiltrate. No pleural effusion or pneumothorax. Right basilar pleural thickening noted consistent scarring. Small sliding hiatal hernia. Old bilateral rib fractures noted. IMPRESSION: 1.  No acute cardiopulmonary disease. 2. Small sliding hiatal hernia . 3.  Old bilateral rib fractures.  No pneumothorax. Electronically Signed   By: Marcello Moores  Register   On: 02/24/2016 08:10    Procedures Procedures (including critical care time)  Medications Ordered in ED Medications - No data to display   Initial Impression / Assessment and Plan / ED Course  I have reviewed the triage vital signs and the nursing notes.  Pertinent labs & imaging results that were available during my care of the patient were reviewed by me and considered in my medical decision making (see chart for details).  Clinical Course    Iv ns. Labs. Cxr.  Reviewed nursing notes and prior charts for additional history.   Albuterol and atrovent neb.  Solumedrol.  Wbc is markedly elevated.  ?infection vs recent start on prednisone rx.    Given productive cough, rhonchi on exam, wbc 24, low grade fever, suspect possible pna/resp infection, although no pna noted on cxr. Iv abx given.   Hgb lower than previous, no melena, stool normal color, will send for hemoccult.   Patient tachycardic, dyspneic. Will admit.   Hospitalists consulted - Dr Nehemiah Settle requests temp orders, med/surg, obs.    Final Clinical Impressions(s) / ED Diagnoses   Final diagnoses:  None    New Prescriptions New Prescriptions   No medications on file      Lajean Saver, MD 02/25/16 [place]

## 2016-02-25 NOTE — ED Triage Notes (Addendum)
Pt reports productive cough with brown sputum since yesterday and emesis starting yesterday as well.  Pt reports emesis is dark black. Pt wearing 3L 02 for COPD.

## 2016-02-25 NOTE — ED Notes (Signed)
Pt was 82-85% on 3L 02 [place].  6L 02 [place] applied and pt sat increased to 94%.

## 2016-02-25 NOTE — Progress Notes (Signed)
[facility] for Rocephin and Zithromax, started in ED Indication:  CAP  Allergies  Allergen Reactions  . Advil [Ibuprofen] Diarrhea  . Aleve [Naproxen Sodium] Nausea And Vomiting    diarrhea   Patient Measurements: Height: 6\' 3"  (190.5 cm) Weight: 216 lb (98 kg) IBW/kg (Calculated) : 84.5  Vital Signs: Temp: 99.7 F (37.6 C) (08/11 1535) Temp Source: Oral (08/11 1535) BP: 124/86 (08/11 1600) Pulse Rate: 115 (08/11 1600)  Labs:  Recent Labs  02/25/16 1600  WBC 23.7*  HGB 9.5*  PLT 280  CREATININE 1.14   No results for input(s): VANCOTROUGH, VANCOPEAK, VANCORANDOM, GENTTROUGH, GENTPEAK, GENTRANDOM, TOBRATROUGH, TOBRAPEAK, TOBRARND, AMIKACINPEAK, AMIKACINTROU, AMIKACIN in the last 72 hours.   Medical History: Past Medical History:  Diagnosis Date  . Cervical pain 11/21/2012  . COPD (chronic obstructive pulmonary disease) ([place])   . DDD (degenerative disc disease)   . Fracture of multiple ribs 11/01/2012  . Fracture of occipital condyle ([place]) 11/21/2012  . Headache   . Hyperlipidemia   . Hypertension   . Inguinal hernia 12/19/2013  . Shortness of breath dyspnea    Assessment: 55yo male started on Rocephin and Zithromax.  No renal adjustment needed.  Estimated Creatinine Clearance: 85.4 mL/min (by C-G formula based on SCr of 1.14 mg/dL).  Plan: F/U plans after admission F/U to switch Zithromax to PO if continued  Ena Dawley, [facility] 02/25/2016

## 2016-02-25 NOTE — Progress Notes (Signed)
2010 Patient arrived to dept 300 room# 312. A&O, able to make needs known. O2 nasal cannula on 3L (chronic), O2 tubing extension added. Security ankle bracelet noted to RIGHT ankle. Patient oriented to room and staff, bed left in lowest position, call bell within reach.

## 2016-02-25 NOTE — Progress Notes (Addendum)
Patient is still confused, " talking , I can't find MY playing Cards". He appears intoxicated also from pain medicine or alcohol or both suspect both. Doesn't appear to know what's going on.

## 2016-02-25 NOTE — Progress Notes (Signed)
Patient placed on HFNC 8 for saturation 83 on 3lpm/[place]

## 2016-02-25 NOTE — H&P (Signed)
History and Physical  Sean Thomas [id number] DOB: 08-Apr-1961 DOA: 02/25/2016  Referring physician: Dr. Ashok Cordia, ED physician PCP: Kerin Perna, NP  Outpatient Specialists:   Dr Lamonte Sakai (Pulmonology)  Chief Complaint: Shortness of breath, cough  HPI: Sean Thomas is a 55 y.o. male with a history of chronic respiratory failure on 3 L oxygen nasal cannula, 75+-pack-year history of smoking, COPD, chronic pain secondary to male healed rib fractures, hypertension, hyperlipidemia. Patient was just recently incarcerated for 1 year has been off cigarettes for approximately 9 months. Patient seen for 45 days of worsening shortness of breath, wheezing, cough with green mucus production. Patient saw his pulmonologist 2 days ago with a normal chest x-ray. He was prescribed prednisone and a new inhaler. Patient started the prednisone, but hasn't been able to fill the new inhaler secondary to insurance refusal. Patient was not any better. His symptoms are worse when he is active. He denies fevers or chills, however patient has been in the bed most the day and, per his wife, "talking out of his head". No other palliating or provoking factors.   Review of Systems:   Pt denies any fevers, chills, nausea, vomiting, diarrhea, constipation, abdominal pain, orthopnea, palpitations, headache, vision changes, lightheadedness, dizziness, melena, rectal bleeding.  Review of systems are otherwise negative  Past Medical History:  Diagnosis Date  . Cervical pain 11/21/2012  . COPD (chronic obstructive pulmonary disease) ([place])   . DDD (degenerative disc disease)   . Fracture of multiple ribs 11/01/2012  . Fracture of occipital condyle ([place]) 11/21/2012  . Headache   . Hyperlipidemia   . Hypertension   . Inguinal hernia 12/19/2013  . Shortness of breath dyspnea    Past Surgical History:  Procedure Laterality Date  . BACK SURGERY    . INGUINAL HERNIA REPAIR Left 07/24/2014   Procedure: LAPAROSCOPIC LEFT  INGUINAL HERNIA REPAIR ;  Surgeon: Ralene Ok, MD;  Location: [facility];  Service: General;  Laterality: Left;  . INSERTION OF MESH Left 07/24/2014   Procedure: INSERTION OF MESH;  Surgeon: Ralene Ok, MD;  Location: [facility];  Service: General;  Laterality: Left;  . LUMBAR FUSION     Social History:  reports that he has been smoking Cigarettes.  He has a 9.25 pack-year smoking history. He has never used smokeless tobacco. He reports that he drinks alcohol. He reports that he does not use drugs. Patient lives at [facility]  . Advil [Ibuprofen] Diarrhea  . Aleve [Naproxen Sodium] Nausea And Vomiting    diarrhea    Family History  Problem Relation Age of Onset  . Diabetes Mother       Prior to Admission medications   Medication Sig Start Date End Date Taking? Authorizing Provider  albuterol (PROVENTIL HFA;VENTOLIN HFA) 108 (90 Base) MCG/ACT inhaler Inhale 2 puffs into the lungs every 6 (six) hours as needed for wheezing or shortness of breath.    Historical Provider, MD  ALPRAZolam Duanne Moron) 0.5 MG tablet Take 0.5 mg by mouth at bedtime as needed for sleep.    Historical Provider, MD  amitriptyline (ELAVIL) 10 MG tablet Take 10 mg by mouth at bedtime.    Historical Provider, MD  aspirin EC 81 MG tablet Take 81 mg by mouth daily.    Historical Provider, MD  benzonatate (TESSALON) 200 MG capsule Take 1 capsule (200 mg total) by mouth 3 (three) times daily as needed for cough. 02/24/16   Deneise Lever, MD  cyclobenzaprine (FLEXERIL)  10 MG tablet Take 10 mg by mouth 3 (three) times daily.    Historical Provider, MD  gabapentin (NEURONTIN) 600 MG tablet Take 600 mg by mouth 3 (three) times daily.    Historical Provider, MD  hydrochlorothiazide (HYDRODIURIL) 25 MG tablet Take 25 mg by mouth daily.    Historical Provider, MD  mometasone-formoterol (DULERA) 100-5 MCG/ACT AERO Inhale 2 puffs into the lungs 2 (two) times daily.    Historical Provider, MD  omeprazole  (PRILOSEC) 20 MG capsule Take 40 mg by mouth daily.    Historical Provider, MD  ondansetron (ZOFRAN) 4 MG tablet Take 4 mg by mouth every 8 (eight) hours as needed for nausea or vomiting.    Historical Provider, MD  oxyCODONE-acetaminophen (PERCOCET) 7.5-325 MG per tablet Take 1 tablet by mouth every 4 (four) hours as needed for severe pain. 12/24/14   Orvan Falconer, MD  pravastatin (PRAVACHOL) 40 MG tablet Take 40 mg by mouth daily.    Historical Provider, MD  predniSONE (DELTASONE) 10 MG tablet Take 4 tab for 2 days, 3 tab for 2 days, 2 tab for 2 tabs, 1 tab for 2 days and then stop 02/24/16   Deneise Lever, MD  traZODone (DESYREL) 100 MG tablet Take 100 mg by mouth at bedtime.    Historical Provider, MD  umeclidinium-vilanterol (ANORO ELLIPTA) 62.5-25 MCG/INH AEPB Inhale 1 puff into the lungs daily. 02/23/16   Collene Gobble, MD    Physical Exam: BP 99/56   Pulse 96   Temp 99.7 F (37.6 C) (Oral)   Resp (!) 28   Ht 6\' 3"  (1.905 m)   Wt 98 kg (216 lb)   SpO2 91%   BMI 27.00 kg/m   General: Middle-aged Caucasian male. Awake and alert and oriented x3. No acute cardiopulmonary distress.  HEENT: Normocephalic atraumatic.  Right and left ears normal in appearance.  Pupils equal, round, reactive to light. Extraocular muscles are intact. Sclerae anicteric and noninjected.  Moist mucosal membranes. No mucosal lesions.  Neck: Neck supple without lymphadenopathy. No carotid bruits. No masses palpated.  Cardiovascular: Regular rate with normal S1-S2 sounds. No murmurs, rubs, gallops auscultated. No JVD.  Respiratory: Diminished breath sounds throughout. Diffuse wheezes and rhonchi. Rales in the bases bilaterally. Abdomen: Soft, nontender, nondistended. Active bowel sounds. No masses or hepatosplenomegaly  Skin: No rashes, lesions, or ulcerations.  Dry, warm to touch. 2+ dorsalis pedis and radial pulses. Musculoskeletal: No calf or leg pain. All major joints not erythematous nontender.  No upper or lower  joint deformation.  Good ROM.  No contractures  Psychiatric: Intact judgment and insight. Pleasant and cooperative. Neurologic: No focal neurological deficits. Strength is 5/5 and symmetric in upper and lower extremities.  Cranial nerves II through XII are grossly intact.           Labs on Admission: I have personally reviewed following labs and imaging studies  CBC:  Recent Labs Lab 02/25/16 1600  WBC 23.7*  NEUTROABS 20.3*  HGB 9.5*  HCT 32.8*  MCV 73.2*  PLT 123456   Basic Metabolic Panel:  Recent Labs Lab 02/25/16 1600  NA 135  K 3.6  CL 102  CO2 26  GLUCOSE 182*  BUN 19  CREATININE 1.14  CALCIUM 8.4*   GFR: Estimated Creatinine Clearance: 85.4 mL/min (by C-G formula based on SCr of 1.14 mg/dL). Liver Function Tests:  Recent Labs Lab 02/25/16 1600  AST 15  ALT 11*  ALKPHOS 51  BILITOT 0.4  PROT 7.1  ALBUMIN  3.6   No results for input(s): LIPASE, AMYLASE in the last 168 hours. No results for input(s): AMMONIA in the last 168 hours. Coagulation Profile: No results for input(s): INR, PROTIME in the last 168 hours. Cardiac Enzymes: No results for input(s): CKTOTAL, CKMB, CKMBINDEX, TROPONINI in the last 168 hours. BNP (last 3 results) No results for input(s): PROBNP in the last 8760 hours. HbA1C: No results for input(s): HGBA1C in the last 72 hours. CBG: No results for input(s): GLUCAP in the last 168 hours. Lipid Profile: No results for input(s): CHOL, HDL, LDLCALC, TRIG, CHOLHDL, LDLDIRECT in the last 72 hours. Thyroid Function Tests: No results for input(s): TSH, T4TOTAL, FREET4, T3FREE, THYROIDAB in the last 72 hours. Anemia Panel: No results for input(s): VITAMINB12, FOLATE, FERRITIN, TIBC, IRON, RETICCTPCT in the last 72 hours. Urine analysis:    Component Value Date/Time   COLORURINE YELLOW 02/25/2016 [place] 02/25/2016 1635   LABSPEC 1.010 02/25/2016 1635   PHURINE 6.5 02/25/2016 1635   GLUCOSEU NEGATIVE 02/25/2016 1635     HGBUR NEGATIVE 02/25/2016 1635   BILIRUBINUR NEGATIVE 02/25/2016 1635   KETONESUR TRACE (A) 02/25/2016 1635   PROTEINUR 30 (A) 02/25/2016 1635   NITRITE NEGATIVE 02/25/2016 1635   LEUKOCYTESUR NEGATIVE 02/25/2016 1635   Sepsis Labs: @LABRCNTIP (procalcitonin:4,lacticidven:4) )No results found for this or any previous visit (from the past 240 hour(s)).   Radiological Exams on Admission: Dg Chest 2 View  Result Date: 02/25/2016 CLINICAL DATA:  Shortness of breath, productive cough. EXAM: CHEST  2 VIEW COMPARISON:  Radiographs of December 24, 2015. FINDINGS: The heart size and mediastinal contours are within normal limits. Both lungs are clear. No pneumothorax or pleural effusion is noted. Old severely displaced left rib fractures are again noted. IMPRESSION: No active cardiopulmonary disease. Electronically Signed   By: Marijo Conception, M.D.   On: 02/25/2016 16:54    EKG: Independently reviewed. Sinus tachycardia with incomplete right bundle branch block. No acute ST elevation or depression.  Assessment/Plan: Principal Problem:   Acute on chronic respiratory failure with hypoxia (HCC) Active Problems:   COPD exacerbation (HCC)   Elevated blood sugar   Chronic pain    This patient was discussed with the ED physician, including pertinent vitals, physical exam findings, labs, and imaging.  We also discussed care given by the ED provider.  #1 acute on chronic respiratory failure with hypoxia  Admit  Oxygen therapy #2 COPD exacerbation  Although not seen on x-ray, rales in the bases concerning for pneumonia.  Antibiotics: Continue ceftriaxone and azithromycin to cover for pneumonia  Pro-calcitonin  Blood cultures have been obtained  Sputum cultures  DuoNeb's every 6 scheduled with albuterol every 2 when necessary  Continue inhaled steroids and LA bronchodilator  Solu-Medrol 60 mg IV every 12 hours  Mucinex #3 elevated blood sugar  Sliding scale insulin  CBGs before  meals and daily at bedtime #4 chronic pain  Continue pain medicine  DVT prophylaxis: Lovenox Consultants: None Code Status: Full code Family Communication: Wife in the room  Disposition Plan: Patient will likely be up to return home following admission.   Truett Mainland, DO [facility] Pager [phone]  If 7PM-7AM, please contact night-coverage [email or web address] Password [email or web address]

## 2016-02-26 DIAGNOSIS — K219 Gastro-esophageal reflux disease without esophagitis: Secondary | ICD-10-CM

## 2016-02-26 DIAGNOSIS — F329 Major depressive disorder, single episode, unspecified: Secondary | ICD-10-CM

## 2016-02-26 DIAGNOSIS — I10 Essential (primary) hypertension: Secondary | ICD-10-CM | POA: Diagnosis present

## 2016-02-26 DIAGNOSIS — G894 Chronic pain syndrome: Secondary | ICD-10-CM | POA: Diagnosis present

## 2016-02-26 DIAGNOSIS — R739 Hyperglycemia, unspecified: Secondary | ICD-10-CM | POA: Diagnosis present

## 2016-02-26 DIAGNOSIS — Z9981 Dependence on supplemental oxygen: Secondary | ICD-10-CM | POA: Diagnosis not present

## 2016-02-26 DIAGNOSIS — J441 Chronic obstructive pulmonary disease with (acute) exacerbation: Secondary | ICD-10-CM | POA: Diagnosis present

## 2016-02-26 DIAGNOSIS — F319 Bipolar disorder, unspecified: Secondary | ICD-10-CM | POA: Diagnosis present

## 2016-02-26 DIAGNOSIS — F17210 Nicotine dependence, cigarettes, uncomplicated: Secondary | ICD-10-CM | POA: Diagnosis present

## 2016-02-26 DIAGNOSIS — E785 Hyperlipidemia, unspecified: Secondary | ICD-10-CM | POA: Diagnosis present

## 2016-02-26 DIAGNOSIS — G8929 Other chronic pain: Secondary | ICD-10-CM | POA: Diagnosis not present

## 2016-02-26 DIAGNOSIS — T380X5A Adverse effect of glucocorticoids and synthetic analogues, initial encounter: Secondary | ICD-10-CM | POA: Diagnosis present

## 2016-02-26 DIAGNOSIS — Z833 Family history of diabetes mellitus: Secondary | ICD-10-CM | POA: Diagnosis not present

## 2016-02-26 DIAGNOSIS — R7309 Other abnormal glucose: Secondary | ICD-10-CM | POA: Diagnosis not present

## 2016-02-26 DIAGNOSIS — R0602 Shortness of breath: Secondary | ICD-10-CM | POA: Diagnosis present

## 2016-02-26 DIAGNOSIS — J9621 Acute and chronic respiratory failure with hypoxia: Secondary | ICD-10-CM | POA: Diagnosis present

## 2016-02-26 LAB — GLUCOSE, CAPILLARY
GLUCOSE-CAPILLARY: 318 mg/dL — AB (ref 65–99)
GLUCOSE-CAPILLARY: 293 mg/dL — AB (ref 65–99)
Glucose-Capillary: 196 mg/dL — ABNORMAL HIGH (ref 65–99)
Glucose-Capillary: 200 mg/dL — ABNORMAL HIGH (ref 65–99)

## 2016-02-26 LAB — EXPECTORATED SPUTUM ASSESSMENT W REFEX TO RESP CULTURE: SPECIAL REQUESTS: NORMAL

## 2016-02-26 LAB — BASIC METABOLIC PANEL
Anion gap: 8 (ref 5–15)
BUN: 21 mg/dL — AB (ref 6–20)
CALCIUM: 8.4 mg/dL — AB (ref 8.9–10.3)
CO2: 27 mmol/L (ref 22–32)
Chloride: 102 mmol/L (ref 101–111)
Creatinine, Ser: 1.05 mg/dL (ref 0.61–1.24)
GFR calc Af Amer: >60 mL/min (ref >60)
GFR calc non Af Amer: >60 mL/min (ref >60)
GLUCOSE: 244 mg/dL — AB (ref 65–99)
Potassium: 3.5 mmol/L (ref 3.5–5.1)
Sodium: 137 mmol/L (ref 135–145)

## 2016-02-26 LAB — CBC
HCT: 30.3 % — ABNORMAL LOW (ref 39.0–52.0)
HEMOGLOBIN: 8.9 g/dL — AB (ref 13.0–17.0)
MCH: 21.3 pg — AB (ref 26.0–34.0)
MCHC: 29.4 g/dL — AB (ref 30.0–36.0)
MCV: 72.7 fL — ABNORMAL LOW (ref 78.0–100.0)
PLATELETS: 232 10*3/uL (ref 150–400)
RBC: 4.17 MIL/uL — ABNORMAL LOW (ref 4.22–5.81)
RDW: 15.5 % (ref 11.5–15.5)
WBC: 19.7 10*3/uL — ABNORMAL HIGH (ref 4.0–10.5)

## 2016-02-26 LAB — EXPECTORATED SPUTUM ASSESSMENT W GRAM STAIN, RFLX TO RESP C

## 2016-02-26 LAB — MRSA PCR SCREENING: MRSA BY PCR: NEGATIVE

## 2016-02-26 LAB — STREP PNEUMONIAE URINARY ANTIGEN: Strep Pneumo Urinary Antigen: NEGATIVE

## 2016-02-26 MED ORDER — METHYLPREDNISOLONE SODIUM SUCC 40 MG IJ SOLR
40.0000 mg | Freq: Three times a day (TID) | INTRAMUSCULAR | Status: DC
  Administered 2016-02-26 – 2016-03-01 (×12): 40 mg via INTRAVENOUS
  Filled 2016-02-26 (×12): qty 1

## 2016-02-26 MED ORDER — GUAIFENESIN 100 MG/5ML PO SOLN
5.0000 mL | Freq: Once | ORAL | Status: AC
  Administered 2016-02-26: 100 mg via ORAL
  Filled 2016-02-26: qty 5

## 2016-02-26 MED ORDER — DEXTROSE 5 % IV SOLN
500.0000 mg | INTRAVENOUS | Status: DC
  Administered 2016-02-26: 500 mg via INTRAVENOUS
  Filled 2016-02-26 (×2): qty 500

## 2016-02-26 MED ORDER — DEXTROSE 5 % IV SOLN
1.0000 g | INTRAVENOUS | Status: DC
  Administered 2016-02-26: 1 g via INTRAVENOUS
  Filled 2016-02-26 (×2): qty 10

## 2016-02-26 MED ORDER — INSULIN DETEMIR 100 UNIT/ML ~~LOC~~ SOLN
10.0000 [IU] | Freq: Every day | SUBCUTANEOUS | Status: DC
  Administered 2016-02-26 – 2016-02-29 (×4): 10 [IU] via SUBCUTANEOUS
  Filled 2016-02-26 (×5): qty 0.1

## 2016-02-26 MED ORDER — BUDESONIDE 0.25 MG/2ML IN SUSP
0.2500 mg | Freq: Two times a day (BID) | RESPIRATORY_TRACT | Status: DC
  Administered 2016-02-26 – 2016-03-01 (×8): 0.25 mg via RESPIRATORY_TRACT
  Filled 2016-02-26 (×9): qty 2

## 2016-02-26 NOTE — Progress Notes (Signed)
Pt coughed up sputum for lab , seems less confused.

## 2016-02-26 NOTE — Progress Notes (Signed)
ANTIBIOTIC CONSULT NOTE  Pharmacy Consult for Rocephin and Zithromax Indication:  CAP  Allergies  Allergen Reactions  . Advil [Ibuprofen] Diarrhea  . Aleve [Naproxen Sodium] Nausea And Vomiting    diarrhea   Patient Measurements: Height: 6\' 3"  (190.5 cm) Weight: 209 lb 1.6 oz (94.8 kg) IBW/kg (Calculated) : 84.5  Vital Signs: Temp: 98.4 F (36.9 C) (08/12 0644) Temp Source: Oral (08/12 0644) BP: 123/78 (08/12 0644) Pulse Rate: 89 (08/12 0644)  Labs:  Recent Labs  02/25/16 1600 02/26/16 0501  WBC 23.7* 19.7*  HGB 9.5* 8.9*  PLT 280 232  CREATININE 1.14 1.05   No results for input(s): VANCOTROUGH, VANCOPEAK, VANCORANDOM, GENTTROUGH, GENTPEAK, GENTRANDOM, TOBRATROUGH, TOBRAPEAK, TOBRARND, AMIKACINPEAK, AMIKACINTROU, AMIKACIN in the last 72 hours.   Medical History: Past Medical History:  Diagnosis Date  . Cervical pain 11/21/2012  . COPD (chronic obstructive pulmonary disease) ([place])   . DDD (degenerative disc disease)   . Fracture of multiple ribs 11/01/2012  . Fracture of occipital condyle ([place]) 11/21/2012  . Headache   . Hyperlipidemia   . Hypertension   . Inguinal hernia 12/19/2013  . Shortness of breath dyspnea    Assessment: 55yo male started on Rocephin and Zithromax.  No renal adjustment needed.  Estimated Creatinine Clearance: 92.8 mL/min (by C-G formula based on SCr of 1.05 mg/dL).  Plan: Rocephin 1gm IV q24hrs Zithromax 500mg  IV q24hrs F/U to switch Zithromax to PO if continued  Ena Dawley, [facility] 02/26/2016

## 2016-02-26 NOTE — Progress Notes (Signed)
2257 MD notified (floor coverage) regarding patient's preliminary sputum culture results of abundant yeast and [place].

## 2016-02-26 NOTE — Progress Notes (Signed)
0330 Patient noted coughing strongly to the point of vomiting. Bed linens changed and patient washed up. Dr.David notified and the following new order given: 1.) Robitussin 4mL PO x 1 time only

## 2016-02-26 NOTE — Progress Notes (Signed)
[facility] PROGRESS NOTE  Sean Thomas [id number] DOB: [birth year] DOA: 02/25/2016 PCP: Kerin Perna, NP  Interim summary and HPI 55 y.o. male with a history of chronic respiratory failure on 3 L oxygen nasal cannula, 75+-pack-year history of smoking, COPD, chronic pain secondary to DDD and multiple rib fractures (fractures are healed now), hypertension, hyperlipidemia. Patient was just recently incarcerated for 1 year has been off cigarettes for approximately 9 months. Patient seen for 45 days of worsening shortness of breath, wheezing, cough with green mucus production.   Admitted for acute on chronic resp failure with hypoxia due to COPD exacerbation and bronchiectasis.  Assessment/Plan: 1-acute on chronic resp failure with hypoxia: due to COPD exacerbation and bronchiectasis  -will continue IV steroids, duoneb and antibiotics -continue O2 supplementation; weaning off as tolerated (uses 3L at baseline) -will start tx with pulmicort and use flutter valve -continue use of mucinex -follow clinical response  -no fever. -slowly improving; but still with poor air movement, wheezing and SOB with minimal exertion.  2-hyperglycemia: w/o hx of diabetes -probably related to steroids; but CBG's in 300 range -will use SSI and levemir -will check A1C  3-Bipolar disorder: -no SI or hallucinations -will continue home medication regimen   4-HTN: -will continue HCTZ -BP fairly well control  5-HLD -will continue statins   6-GERD: -will use PPI  7-chronic pain syndrome: -will continue current home analgesics regimen   Code Status: Full Family Communication: no family at bedside Disposition Plan: remains in the hospital, continue IV solumedrol, start pulmicort, continue antibiotics and start flutter valve. Poor air movement and SOB with minimal exertion. Anticipate discharge home once breathing status stabilizes.   Consultants:  None   Procedures:  See below for  x-ray reports   Antibiotics:  Rocephin and zithromax 8/11  HPI/Subjective: Afebrile, no complaints of CP. Intermittently coughing and with difficulty speaking in full sentences. Also with complaints of SOB with minimal exertion.  Objective: Vitals:   02/26/16 0644 02/26/16 1301  BP: 123/78 112/78  Pulse: 89 (!) 110  Resp: 20 20  Temp: 98.4 F (36.9 C) 97.9 F (36.6 C)    Intake/Output Summary (Last 24 hours) at 02/26/16 1658 Last data filed at 02/26/16 1654  Gross per 24 hour  Intake                0 ml  Output             2150 ml  Net            -2150 ml   Filed Weights   02/25/16 1546 02/25/16 2016  Weight: 98 kg (216 lb) 94.8 kg (209 lb 1.6 oz)    Exam:   General:  Afebrile, still with SOB on minimal exertion and with difficulties speaking in full sentences. On 4L of O2 supplementation   Cardiovascular: S1 and S2, no rubs, no gallops   Respiratory: poor air movement, scattered wheezing, no rales.  Abdomen: soft, NT, ND, positive BS  Musculoskeletal: no edema or cyanosis   Data Reviewed: Basic Metabolic Panel:  Recent Labs Lab 02/25/16 1600 02/26/16 0501  NA 135 137  K 3.6 3.5  CL 102 102  CO2 26 27  GLUCOSE 182* 244*  BUN 19 21*  CREATININE 1.14 1.05  CALCIUM 8.4* 8.4*   Liver Function Tests:  Recent Labs Lab 02/25/16 1600  AST 15  ALT 11*  ALKPHOS 51  BILITOT 0.4  PROT 7.1  ALBUMIN 3.6   CBC:  Recent Labs Lab  02/25/16 1600 02/26/16 0501  WBC 23.7* 19.7*  NEUTROABS 20.3*  --   HGB 9.5* 8.9*  HCT 32.8* 30.3*  MCV 73.2* 72.7*  PLT 280 232   CBG:  Recent Labs Lab 02/25/16 2125 02/26/16 0749 02/26/16 1148 02/26/16 1626  GLUCAP 296* 196* 318* 200*    Recent Results (from the past 240 hour(s))  Blood Culture (routine x 2)     Status: None (Preliminary result)   Collection Time: 02/25/16  4:23 PM  Result Value Ref Range Status   Specimen Description BLOOD  Final   Special Requests NONE  Final   Culture NO GROWTH < 24  HOURS  Final   Report Status PENDING  Incomplete  Blood Culture (routine x 2)     Status: None (Preliminary result)   Collection Time: 02/25/16  4:26 PM  Result Value Ref Range Status   Specimen Description BLOOD  Final   Special Requests NONE  Final   Culture NO GROWTH < 24 HOURS  Final   Report Status PENDING  Incomplete  MRSA PCR Screening     Status: None   Collection Time: 02/25/16  9:55 PM  Result Value Ref Range Status   MRSA by PCR NEGATIVE NEGATIVE Final    Comment:        The GeneXpert MRSA Assay (FDA approved for NASAL specimens only), is one component of a comprehensive MRSA colonization surveillance program. It is not intended to diagnose MRSA infection nor to guide or monitor treatment for MRSA infections.   Culture, expectorated sputum-assessment     Status: None   Collection Time: 02/26/16  3:37 AM  Result Value Ref Range Status   Specimen Description SPU  Final   Special Requests Normal  Final   Sputum evaluation THIS SPECIMEN IS ACCEPTABLE FOR SPUTUM CULTURE  Final   Report Status 02/26/2016 FINAL  Final     Studies: Dg Chest 2 View  Result Date: 02/25/2016 CLINICAL DATA:  Shortness of breath, productive cough. EXAM: CHEST  2 VIEW COMPARISON:  Radiographs of December 24, 2015. FINDINGS: The heart size and mediastinal contours are within normal limits. Both lungs are clear. No pneumothorax or pleural effusion is noted. Old severely displaced left rib fractures are again noted. IMPRESSION: No active cardiopulmonary disease. Electronically Signed   By: Marijo Conception, M.D.   On: 02/25/2016 16:54    Scheduled Meds: . amitriptyline  10 mg Oral QHS  . antiseptic oral rinse  7 mL Mouth Rinse BID  . aspirin EC  81 mg Oral Daily  . azithromycin  500 mg Intravenous Q24H  . budesonide (PULMICORT) nebulizer solution  0.25 mg Nebulization BID  . cefTRIAXone (ROCEPHIN)  IV  1 g Intravenous Q24H  . cyclobenzaprine  10 mg Oral TID  . enoxaparin (LOVENOX) injection  40 mg  Subcutaneous Q24H  . gabapentin  600 mg Oral TID  . guaiFENesin  600 mg Oral BID  . hydrochlorothiazide  25 mg Oral Daily  . insulin aspart  0-20 Units Subcutaneous TID WC  . insulin aspart  0-5 Units Subcutaneous QHS  . insulin detemir  10 Units Subcutaneous QHS  . ipratropium-albuterol  3 mL Nebulization Q6H  . methylPREDNISolone (SOLU-MEDROL) injection  40 mg Intravenous Q8H  . pantoprazole  40 mg Oral Daily  . pneumococcal 23 valent vaccine  0.5 mL Intramuscular Tomorrow-1000  . pravastatin  40 mg Oral q1800  . traZODone  100 mg Oral QHS    Time spent: 30 minutes   Dyann Kief,  Clifton James  [facility] Pager [phone] If 7PM-7AM, please contact night-coverage at [email or web address], password [facility] 02/26/2016, 4:58 PM  LOS: 0 days

## 2016-02-26 NOTE — Progress Notes (Signed)
Patient is still confused, suspect this is from self medication from home oxygen HFNC 8 -- 93 saturation. Patient wears 3-4 lpm [place] at home.

## 2016-02-26 NOTE — Progress Notes (Signed)
MRSA PCR negative, contact precautions d/c.

## 2016-02-27 DIAGNOSIS — J189 Pneumonia, unspecified organism: Secondary | ICD-10-CM

## 2016-02-27 LAB — GLUCOSE, CAPILLARY
GLUCOSE-CAPILLARY: 159 mg/dL — AB (ref 65–99)
GLUCOSE-CAPILLARY: 210 mg/dL — AB (ref 65–99)
Glucose-Capillary: 158 mg/dL — ABNORMAL HIGH (ref 65–99)
Glucose-Capillary: 164 mg/dL — ABNORMAL HIGH (ref 65–99)

## 2016-02-27 LAB — PROCALCITONIN: PROCALCITONIN: 0.36 ng/mL

## 2016-02-27 MED ORDER — VANCOMYCIN HCL IN DEXTROSE 1-5 GM/200ML-% IV SOLN
1000.0000 mg | Freq: Three times a day (TID) | INTRAVENOUS | Status: DC
  Administered 2016-02-27 – 2016-03-01 (×9): 1000 mg via INTRAVENOUS
  Filled 2016-02-27 (×9): qty 200

## 2016-02-27 MED ORDER — VANCOMYCIN HCL IN DEXTROSE 1-5 GM/200ML-% IV SOLN
1000.0000 mg | Freq: Once | INTRAVENOUS | Status: AC
  Administered 2016-02-27: 1000 mg via INTRAVENOUS
  Filled 2016-02-27: qty 200

## 2016-02-27 MED ORDER — DEXTROSE 5 % IV SOLN
2.0000 g | INTRAVENOUS | Status: DC
  Administered 2016-02-27 – 2016-02-29 (×3): 2 g via INTRAVENOUS
  Filled 2016-02-27 (×4): qty 2

## 2016-02-27 NOTE — Progress Notes (Signed)
ANTIBIOTIC CONSULT NOTE  Addendum: adding vancomycin 2/2 GPC on sputum gram stain.  Plan: - Vancomycin 1g IV q8h - Check trough if treatment last > 7 days or otherwise clinically indicated - Follow up SCr, UOP, cultures, clinical course and adjust as clinically indicated.  [place] PharmD BCPS 02/27/2016 12:13 AM     Pharmacy Consult for Rocephin and Zithromax Indication:  CAP  Allergies  Allergen Reactions  . Advil [Ibuprofen] Diarrhea  . Aleve [Naproxen Sodium] Nausea And Vomiting    diarrhea   Patient Measurements: Height: 6\' 3"  (190.5 cm) Weight: 209 lb 1.6 oz (94.8 kg) IBW/kg (Calculated) : 84.5  Vital Signs: Temp: 98.2 F (36.8 C) (08/12 2148) Temp Source: Oral (08/12 2148) BP: 171/88 (08/12 2148) Pulse Rate: 113 (08/12 2148)  Labs:  Recent Labs  02/25/16 1600 02/26/16 0501  WBC 23.7* 19.7*  HGB 9.5* 8.9*  PLT 280 232  CREATININE 1.14 1.05   No results for input(s): VANCOTROUGH, VANCOPEAK, VANCORANDOM, GENTTROUGH, GENTPEAK, GENTRANDOM, TOBRATROUGH, TOBRAPEAK, TOBRARND, AMIKACINPEAK, AMIKACINTROU, AMIKACIN in the last 72 hours.   Medical History: Past Medical History:  Diagnosis Date  . Cervical pain 11/21/2012  . COPD (chronic obstructive pulmonary disease) ([place])   . DDD (degenerative disc disease)   . Fracture of multiple ribs 11/01/2012  . Fracture of occipital condyle ([place]) 11/21/2012  . Headache   . Hyperlipidemia   . Hypertension   . Inguinal hernia 12/19/2013  . Shortness of breath dyspnea    Assessment: 55yo male started on Rocephin and Zithromax.  No renal adjustment needed.  Estimated Creatinine Clearance: 92.8 mL/min (by C-G formula based on SCr of 1.05 mg/dL).  Plan: Rocephin 1gm IV q24hrs Zithromax 500mg  IV q24hrs F/U to switch Zithromax to PO if continued  Vonda Antigua, [facility] 02/27/2016

## 2016-02-27 NOTE — Progress Notes (Signed)
[facility] PROGRESS NOTE  Sean Thomas [id number] DOB: [birth year] DOA: 02/25/2016 PCP: Kerin Perna, NP  Interim summary and HPI 55 y.o. male with a history of chronic respiratory failure on 3 L oxygen nasal cannula, 75+-pack-year history of smoking, COPD, chronic pain secondary to DDD and multiple rib fractures (fractures are healed now), hypertension, hyperlipidemia. Patient was just recently incarcerated for 1 year has been off cigarettes for approximately 9 months. Patient seen for 45 days of worsening shortness of breath, wheezing, cough with green mucus production.   Admitted for acute on chronic resp failure with hypoxia due to COPD exacerbation and bronchiectasis.  Assessment/Plan: 1-acute on chronic resp failure with hypoxia: due to COPD exacerbation and bronchiectasis  -will continue IV steroids, duoneb and antibiotics (rocephin and vanc now) -continue O2 supplementation; weaning off as tolerated (uses 3L at baseline) -will start tx with pulmicort and use flutter valve -continue use of mucinex -follow clinical response  -no fever; WBC's in 20,000 on admission (imaging steroids playing role in elevated leukocytes) -slowly improving; but still with poor air movement, wheezing and SOB with minimal exertion. -patient is requiring high flow supplementation and had positive GPC on sputum cx's. Will add vancomycin to antibiotics coverage for now and will get CXR in am -base on further decline or lack of improvement will ask for formal pulmonary service evaluation  2-hyperglycemia: w/o hx of diabetes -probably related to steroids; but CBG's in 300 range -will use SSI and levemir -will follow A1C  3-Bipolar disorder: -no SI or hallucinations -will continue home medication regimen   4-HTN: -will continue HCTZ -BP fairly well control  5-HLD -will continue statins   6-GERD: -will use PPI  7-chronic pain syndrome: -will continue current home analgesics regimen    Code Status: Full Family Communication: no family at bedside Disposition Plan: remains in the hospital, continue IV solumedrol, start pulmicort, continue antibiotics and start flutter valve. Poor air movement and SOB with minimal exertion. Anticipate discharge home once breathing status stabilizes.   Consultants:  None   Procedures:  See below for x-ray reports   Antibiotics:  Rocephin 8/11>>>  zithromax 8/11 >>8/13  vancomycin 8/12>>  HPI/Subjective: Afebrile, no complaints of CP. Intermittently coughing and with increase SOB on minimal activity. Requiring high flow [place] for supplementation. Sputum cx's positive for 2/2 GPC  Objective: Vitals:   02/26/16 2148 02/27/16 0643  BP: (!) 171/88 111/78  Pulse: (!) 113 86  Resp: (!) 22 20  Temp: 98.2 F (36.8 C) 98.4 F (36.9 C)    Intake/Output Summary (Last 24 hours) at 02/27/16 1424 Last data filed at 02/27/16 0643  Gross per 24 hour  Intake              500 ml  Output             2600 ml  Net            -2100 ml   Filed Weights   02/25/16 1546 02/25/16 2016  Weight: 98 kg (216 lb) 94.8 kg (209 lb 1.6 oz)    Exam:   General:  Afebrile, still with SOB on minimal exertion, requiring high flow [place] supplementation and coughing    Cardiovascular: S1 and S2, no rubs, no gallops   Respiratory: poor air movement, scattered wheezing, no rales.  Abdomen: soft, NT, ND, positive BS  Musculoskeletal: no edema or cyanosis   Data Reviewed: Basic Metabolic Panel:  Recent Labs Lab 02/25/16 1600 02/26/16 0501  NA 135 137  K 3.6 3.5  CL 102 102  CO2 26 27  GLUCOSE 182* 244*  BUN 19 21*  CREATININE 1.14 1.05  CALCIUM 8.4* 8.4*   Liver Function Tests:  Recent Labs Lab 02/25/16 1600  AST 15  ALT 11*  ALKPHOS 51  BILITOT 0.4  PROT 7.1  ALBUMIN 3.6   CBC:  Recent Labs Lab 02/25/16 1600 02/26/16 0501  WBC 23.7* 19.7*  NEUTROABS 20.3*  --   HGB 9.5* 8.9*  HCT 32.8* 30.3*  MCV 73.2* 72.7*  PLT 280  232   CBG:  Recent Labs Lab 02/26/16 1148 02/26/16 1626 02/26/16 2115 02/27/16 0741 02/27/16 1155  GLUCAP 318* 200* 293* 159* 158*    Recent Results (from the past 240 hour(s))  Blood Culture (routine x 2)     Status: None (Preliminary result)   Collection Time: 02/25/16  4:23 PM  Result Value Ref Range Status   Specimen Description BLOOD  Final   Special Requests NONE  Final   Culture NO GROWTH < 24 HOURS  Final   Report Status PENDING  Incomplete  Blood Culture (routine x 2)     Status: None (Preliminary result)   Collection Time: 02/25/16  4:26 PM  Result Value Ref Range Status   Specimen Description BLOOD  Final   Special Requests NONE  Final   Culture NO GROWTH < 24 HOURS  Final   Report Status PENDING  Incomplete  MRSA PCR Screening     Status: None   Collection Time: 02/25/16  9:55 PM  Result Value Ref Range Status   MRSA by PCR NEGATIVE NEGATIVE Final    Comment:        The GeneXpert MRSA Assay (FDA approved for NASAL specimens only), is one component of a comprehensive MRSA colonization surveillance program. It is not intended to diagnose MRSA infection nor to guide or monitor treatment for MRSA infections.   Culture, expectorated sputum-assessment     Status: None   Collection Time: 02/26/16  3:37 AM  Result Value Ref Range Status   Specimen Description SPU  Final   Special Requests Normal  Final   Sputum evaluation THIS SPECIMEN IS ACCEPTABLE FOR SPUTUM CULTURE  Final   Report Status 02/26/2016 FINAL  Final  Culture, respiratory (NON-Expectorated)     Status: None (Preliminary result)   Collection Time: 02/26/16  3:37 AM  Result Value Ref Range Status   Specimen Description SPU  Final   Special Requests NONE  Final   Gram Stain   Final    FEW WBC PRESENT, PREDOMINANTLY PMN FEW SQUAMOUS EPITHELIAL CELLS PRESENT ABUNDANT BUDDING YEAST SEEN RARE GRAM NEGATIVE COCCI IN PAIRS    Culture   Final    CULTURE REINCUBATED FOR BETTER GROWTH Performed at  [facility]    Report Status PENDING  Incomplete     Studies: Dg Chest 2 View  Result Date: 02/25/2016 CLINICAL DATA:  Shortness of breath, productive cough. EXAM: CHEST  2 VIEW COMPARISON:  Radiographs of December 24, 2015. FINDINGS: The heart size and mediastinal contours are within normal limits. Both lungs are clear. No pneumothorax or pleural effusion is noted. Old severely displaced left rib fractures are again noted. IMPRESSION: No active cardiopulmonary disease. Electronically Signed   By: Marijo Conception, M.D.   On: 02/25/2016 16:54    Scheduled Meds: . amitriptyline  10 mg Oral QHS  . antiseptic oral rinse  7 mL Mouth Rinse BID  . aspirin EC  81 mg Oral  Daily  . budesonide (PULMICORT) nebulizer solution  0.25 mg Nebulization BID  . cefTRIAXone (ROCEPHIN)  IV  2 g Intravenous Q24H  . cyclobenzaprine  10 mg Oral TID  . enoxaparin (LOVENOX) injection  40 mg Subcutaneous Q24H  . gabapentin  600 mg Oral TID  . guaiFENesin  600 mg Oral BID  . hydrochlorothiazide  25 mg Oral Daily  . insulin aspart  0-20 Units Subcutaneous TID WC  . insulin aspart  0-5 Units Subcutaneous QHS  . insulin detemir  10 Units Subcutaneous QHS  . ipratropium-albuterol  3 mL Nebulization Q6H  . methylPREDNISolone (SOLU-MEDROL) injection  40 mg Intravenous Q8H  . pantoprazole  40 mg Oral Daily  . pneumococcal 23 valent vaccine  0.5 mL Intramuscular Tomorrow-1000  . pravastatin  40 mg Oral q1800  . traZODone  100 mg Oral QHS  . vancomycin  1,000 mg Intravenous Q8H    Time spent: 30 minutes   Barton Dubois  [facility] Pager [phone] If 7PM-7AM, please contact night-coverage at [email or web address], password [facility] 02/27/2016, 2:24 PM  LOS: 1 day

## 2016-02-28 ENCOUNTER — Inpatient Hospital Stay (HOSPITAL_COMMUNITY): Payer: Medicaid Other

## 2016-02-28 LAB — BASIC METABOLIC PANEL
ANION GAP: 6 (ref 5–15)
BUN: 24 mg/dL — ABNORMAL HIGH (ref 6–20)
CALCIUM: 8.5 mg/dL — AB (ref 8.9–10.3)
CO2: 36 mmol/L — ABNORMAL HIGH (ref 22–32)
Chloride: 94 mmol/L — ABNORMAL LOW (ref 101–111)
Creatinine, Ser: 0.94 mg/dL (ref 0.61–1.24)
Glucose, Bld: 155 mg/dL — ABNORMAL HIGH (ref 65–99)
Potassium: 4.1 mmol/L (ref 3.5–5.1)
Sodium: 136 mmol/L (ref 135–145)

## 2016-02-28 LAB — CBC
HCT: 28.8 % — ABNORMAL LOW (ref 39.0–52.0)
Hemoglobin: 8.5 g/dL — ABNORMAL LOW (ref 13.0–17.0)
MCH: 21.5 pg — ABNORMAL LOW (ref 26.0–34.0)
MCHC: 29.5 g/dL — ABNORMAL LOW (ref 30.0–36.0)
MCV: 72.9 fL — ABNORMAL LOW (ref 78.0–100.0)
PLATELETS: 303 10*3/uL (ref 150–400)
RBC: 3.95 MIL/uL — ABNORMAL LOW (ref 4.22–5.81)
RDW: 15.7 % — AB (ref 11.5–15.5)
WBC: 23.3 10*3/uL — AB (ref 4.0–10.5)

## 2016-02-28 LAB — GLUCOSE, CAPILLARY
GLUCOSE-CAPILLARY: 115 mg/dL — AB (ref 65–99)
Glucose-Capillary: 136 mg/dL — ABNORMAL HIGH (ref 65–99)
Glucose-Capillary: 176 mg/dL — ABNORMAL HIGH (ref 65–99)
Glucose-Capillary: 216 mg/dL — ABNORMAL HIGH (ref 65–99)

## 2016-02-28 LAB — BLOOD GAS, ARTERIAL
ACID-BASE EXCESS: 8 mmol/L — AB (ref 0.0–2.0)
Bicarbonate: 31.2 mEq/L — ABNORMAL HIGH (ref 20.0–24.0)
Drawn by: 270161
O2 CONTENT: 5 L/min
O2 SAT: 87.9 %
PATIENT TEMPERATURE: 98.6
PCO2 ART: 51.8 mmHg — AB (ref 35.0–45.0)
TCO2: 10.7 mmol/L (ref 0–100)
pH, Arterial: 7.41 (ref 7.350–7.450)
pO2, Arterial: 58.4 mmHg — ABNORMAL LOW (ref 80.0–100.0)

## 2016-02-28 LAB — LEGIONELLA PNEUMOPHILA SEROGP 1 UR AG: L. PNEUMOPHILA SEROGP 1 UR AG: NEGATIVE

## 2016-02-28 LAB — HEMOGLOBIN A1C
Hgb A1c MFr Bld: 5.9 % — ABNORMAL HIGH (ref 4.8–5.6)
Mean Plasma Glucose: 123 mg/dL

## 2016-02-28 LAB — OCCULT BLOOD, POC DEVICE: FECAL OCCULT BLD: NEGATIVE
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
## 2016-02-28 NOTE — Progress Notes (Addendum)
[facility] PROGRESS NOTE  Sean Thomas [id number] DOB: 17-Apr-1961 DOA: 02/25/2016 PCP: Kerin Perna, NP  Interim summary and HPI 55 y.o. male with a history of chronic respiratory failure on 3 L oxygen nasal cannula, 75+-pack-year history of smoking, COPD, chronic pain secondary to DDD and multiple rib fractures (fractures are healed now), hypertension, hyperlipidemia. Patient was just recently incarcerated for 1 year has been off cigarettes for approximately 9 months. Patient seen for 45 days of worsening shortness of breath, wheezing, cough with green mucus production.   Admitted for acute on chronic resp failure with hypoxia due to COPD exacerbation and bronchiectasis.  Assessment/Plan: 1-acute on chronic resp failure with hypoxia: due to COPD exacerbation and bronchiectasis  -will continue IV steroids, duoneb and antibiotics (rocephin and vanc now) -continue O2 supplementation; weaning off as tolerated (uses 3-4L at baseline). Currently on high flow supplementation using 5-8L -will continue tx with pulmicort and use flutter valve -continue use of mucinex -follow clinical response  -no fever; WBC's in 20,000 since admission (imaging steroids playing role in elevated leukocytes). -minimal improvement in his symptoms today; continue to have decrease air movement (even better than on admission), intermittent wheezing and SOB with minimal exertion. Also requiring significant amount of oxygen supplementation. Abnormal ABG -patient is requiring high flow supplementation and had positive GPC on sputum cx's. Will add vancomycin to antibiotics coverage for now and will get CXR in am -Given abnormal ABG' still requirement of high flow [place] supplementation and slow response; will ask pulmonary service to see and assist with care.  2-hyperglycemia: w/o hx of diabetes -probably related to steroids; but CBG's in 300 range -will use SSI and levemir -A1C 5.9  3-Bipolar disorder: -no SI  or hallucinations -will continue home medication regimen   4-HTN: -will continue HCTZ -BP fairly well control  5-HLD -will continue statins   6-GERD: -will use PPI  7-chronic pain syndrome: -will continue current home analgesics regimen   Code Status: Full Family Communication: no family at bedside Disposition Plan: remains in the hospital, continue IV solumedrol, start pulmicort, continue antibiotics and start flutter valve. Some improving on air movement; but still SOB with minimal exertion. Anticipate discharge home once breathing status stabilizes.   Consultants:  Pulmonary service   Procedures:  See below for x-ray reports   Antibiotics:  Rocephin 8/11>>>  zithromax 8/11 >>8/13  vancomycin 8/12>>  HPI/Subjective: Afebrile, no complaints of CP. Intermittently coughing and with increase SOB on minimal activity. Requiring high flow [place] for supplementation. Sputum cx's positive for 2/2 GPC. Also with ABG showing O2 of 58.1  Objective: Vitals:   02/27/16 1426 02/28/16 0524  BP: 133/82 111/71  Pulse: (!) 110 85  Resp: 20 19  Temp: 98.2 F (36.8 C) 98 F (36.7 C)    Intake/Output Summary (Last 24 hours) at 02/28/16 1324 Last data filed at 02/28/16 0900  Gross per 24 hour  Intake             1130 ml  Output                0 ml  Net             1130 ml   Filed Weights   02/25/16 1546 02/25/16 2016  Weight: 98 kg (216 lb) 94.8 kg (209 lb 1.6 oz)    Exam:   General:  Afebrile, still with SOB on minimal exertion, requiring high flow [place] supplementation and with desaturation in mid 80's while attempting to weaned him  off.   Cardiovascular: S1 and S2, no rubs, no gallops   Respiratory: some improvement in air movement, scattered wheezing, no rales. Positive rhonchi  Abdomen: soft, NT, ND, positive BS  Musculoskeletal: no edema or cyanosis   Data Reviewed: Basic Metabolic Panel:  Recent Labs Lab 02/25/16 1600 02/26/16 0501 02/28/16 0603  NA 135  137 136  K 3.6 3.5 4.1  CL 102 102 94*  CO2 26 27 36*  GLUCOSE 182* 244* 155*  BUN 19 21* 24*  CREATININE 1.14 1.05 0.94  CALCIUM 8.4* 8.4* 8.5*   Liver Function Tests:  Recent Labs Lab 02/25/16 1600  AST 15  ALT 11*  ALKPHOS 51  BILITOT 0.4  PROT 7.1  ALBUMIN 3.6   CBC:  Recent Labs Lab 02/25/16 1600 02/26/16 0501 02/28/16 0603  WBC 23.7* 19.7* 23.3*  NEUTROABS 20.3*  --   --   HGB 9.5* 8.9* 8.5*  HCT 32.8* 30.3* 28.8*  MCV 73.2* 72.7* 72.9*  PLT 280 232 303   CBG:  Recent Labs Lab 02/27/16 1155 02/27/16 1631 02/27/16 2021 02/28/16 0736 02/28/16 1116  GLUCAP 158* 210* 164* 136* 176*    Recent Results (from the past 240 hour(s))  Blood Culture (routine x 2)     Status: None (Preliminary result)   Collection Time: 02/25/16  4:23 PM  Result Value Ref Range Status   Specimen Description BLOOD  Final   Special Requests NONE  Final   Culture NO GROWTH 3 DAYS  Final   Report Status PENDING  Incomplete  Blood Culture (routine x 2)     Status: None (Preliminary result)   Collection Time: 02/25/16  4:26 PM  Result Value Ref Range Status   Specimen Description BLOOD  Final   Special Requests NONE  Final   Culture NO GROWTH 3 DAYS  Final   Report Status PENDING  Incomplete  MRSA PCR Screening     Status: None   Collection Time: 02/25/16  9:55 PM  Result Value Ref Range Status   MRSA by PCR NEGATIVE NEGATIVE Final    Comment:        The GeneXpert MRSA Assay (FDA approved for NASAL specimens only), is one component of a comprehensive MRSA colonization surveillance program. It is not intended to diagnose MRSA infection nor to guide or monitor treatment for MRSA infections.   Culture, expectorated sputum-assessment     Status: None   Collection Time: 02/26/16  3:37 AM  Result Value Ref Range Status   Specimen Description SPU  Final   Special Requests Normal  Final   Sputum evaluation THIS SPECIMEN IS ACCEPTABLE FOR SPUTUM CULTURE  Final   Report  Status 02/26/2016 FINAL  Final  Culture, respiratory (NON-Expectorated)     Status: None (Preliminary result)   Collection Time: 02/26/16  3:37 AM  Result Value Ref Range Status   Specimen Description SPU  Final   Special Requests NONE  Final   Gram Stain   Final    FEW WBC PRESENT, PREDOMINANTLY PMN FEW SQUAMOUS EPITHELIAL CELLS PRESENT ABUNDANT BUDDING YEAST SEEN RARE GRAM NEGATIVE COCCI IN PAIRS    Culture   Final    CULTURE REINCUBATED FOR BETTER GROWTH Performed at [facility]    Report Status PENDING  Incomplete     Studies: Dg Chest 2 View  Result Date: 02/28/2016 CLINICAL DATA:  Shortness of breath. EXAM: CHEST  2 VIEW COMPARISON:  02/25/2016 FINDINGS: Again noted are old left rib fractures with deformity of the  left hemithorax. Left costophrenic angle is not completely visualized. Lungs are clear without airspace disease or pulmonary edema. Heart and mediastinum are within normal limits. Mild degenerative changes in the thoracic spine. No large pleural effusions. Retrocardiac density is suggestive for a hiatal hernia. IMPRESSION: No acute cardiopulmonary disease. Chronic deformity of the left hemithorax due to old displaced left rib fractures. Electronically Signed   By: Markus Daft M.D.   On: 02/28/2016 11:55    Scheduled Meds: . amitriptyline  10 mg Oral QHS  . antiseptic oral rinse  7 mL Mouth Rinse BID  . aspirin EC  81 mg Oral Daily  . budesonide (PULMICORT) nebulizer solution  0.25 mg Nebulization BID  . cefTRIAXone (ROCEPHIN)  IV  2 g Intravenous Q24H  . cyclobenzaprine  10 mg Oral TID  . enoxaparin (LOVENOX) injection  40 mg Subcutaneous Q24H  . gabapentin  600 mg Oral TID  . guaiFENesin  600 mg Oral BID  . hydrochlorothiazide  25 mg Oral Daily  . insulin aspart  0-20 Units Subcutaneous TID WC  . insulin aspart  0-5 Units Subcutaneous QHS  . insulin detemir  10 Units Subcutaneous QHS  . ipratropium-albuterol  3 mL Nebulization Q6H  . methylPREDNISolone  (SOLU-MEDROL) injection  40 mg Intravenous Q8H  . pantoprazole  40 mg Oral Daily  . pneumococcal 23 valent vaccine  0.5 mL Intramuscular Tomorrow-1000  . pravastatin  40 mg Oral q1800  . traZODone  100 mg Oral QHS  . vancomycin  1,000 mg Intravenous Q8H    Time spent: 30 minutes   Barton Dubois  [facility] Pager [phone] If 7PM-7AM, please contact night-coverage at [email or web address], password [facility] 02/28/2016, 1:24 PM  LOS: 2 days

## 2016-02-28 NOTE — Progress Notes (Signed)
Patient likes to take off oxygen saturation drops to 86 he appears no worse for it.

## 2016-02-29 LAB — GLUCOSE, CAPILLARY
GLUCOSE-CAPILLARY: 143 mg/dL — AB (ref 65–99)
GLUCOSE-CAPILLARY: 204 mg/dL — AB (ref 65–99)
Glucose-Capillary: 249 mg/dL — ABNORMAL HIGH (ref 65–99)
Glucose-Capillary: 126 mg/dL — ABNORMAL HIGH (ref 65–99)

## 2016-02-29 LAB — BASIC METABOLIC PANEL
ANION GAP: 7 (ref 5–15)
BUN: 23 mg/dL — AB (ref 6–20)
CHLORIDE: 94 mmol/L — AB (ref 101–111)
CO2: 35 mmol/L — AB (ref 22–32)
CREATININE: 0.9 mg/dL (ref 0.61–1.24)
Calcium: 8.7 mg/dL — ABNORMAL LOW (ref 8.9–10.3)
GFR calc non Af Amer: >60 mL/min (ref >60)
GLUCOSE: 153 mg/dL — AB (ref 65–99)
POTASSIUM: 3.9 mmol/L (ref 3.5–5.1)
Sodium: 136 mmol/L (ref 135–145)

## 2016-02-29 LAB — PROCALCITONIN: PROCALCITONIN: 0.11 ng/mL

## 2016-02-29 LAB — VANCOMYCIN, TROUGH: VANCOMYCIN TR: 20 ug/mL (ref 15–20)

## 2016-02-29 NOTE — Progress Notes (Signed)
SATURATION QUALIFICATIONS: (This note is used to comply with regulatory documentation for home oxygen)  Patient Saturations on Room Air at Rest = 86%  Patient Saturations on Room Air while Ambulating = 85%  Patient Saturations on 4 Liters of oxygen while Ambulating = 86%  Please briefly explain why patient needs home oxygen:

## 2016-02-29 NOTE — Progress Notes (Signed)
Pharmacy Antibiotic Note  Sean Thomas is a 55 y.o. male admitted on 02/25/2016 with COPD exacerbation and bronchiectasis .  Pharmacy has been consulted for Vancomycin and Rocephin dosing.  Plan: Vancomycin trough @ goal. Continue Vancomycin 1gm IV every 8 hours.  Goal trough 15-20 mcg/mL.  Monitor labs, micro and vitals.   Height: 6\' 3"  (D34-534 cm) Weight: 209 lb 1.6 oz (94.8 kg) IBW/kg (Calculated) : 84.5  Temp (24hrs), Avg:98 F (36.7 C), Min:97.7 F (36.5 C), Max:98.4 F (36.9 C)   Recent Labs Lab 02/25/16 1600 02/25/16 1616 02/26/16 0501 02/28/16 0603 02/29/16 0551  WBC 23.7*  --  19.7* 23.3*  --   CREATININE 1.14  --  1.05 0.94 0.90  LATICACIDVEN  --  1.24  --   --   --   VANCOTROUGH  --   --   --   --  20    Estimated Creatinine Clearance: 108.2 mL/min (by C-G formula based on SCr of 0.9 mg/dL).    Allergies  Allergen Reactions  . Advil [Ibuprofen] Diarrhea  . Aleve [Naproxen Sodium] Nausea And Vomiting    diarrhea    Antimicrobials this admission: R 8/11 >> Azith 8/11>>8/12 Vanc 8/13 >>   Dose adjustments this admission: n/a  Microbiology results: 8/11 BCx: Pending 8/12 Resp: G(+) Cocci Pairs 8/11 MRSA PCR (-)  Thank you for allowing pharmacy to be a part of this patient's care.  Pricilla Larsson 02/29/2016 9:22 AM

## 2016-02-29 NOTE — Progress Notes (Signed)
Subjective: He says he feels better. He's on 4 L oxygen now. His breathing is better. He is still complaining of chest pain associated with his previous rib fractures  Objective: Vital signs in last 24 hours: Temp:  [97.7 F (36.5 C)-98.4 F (36.9 C)] 97.7 F (36.5 C) (08/15 0523) Pulse Rate:  [81-102] 81 (08/15 0523) Resp:  [17-20] 17 (08/15 0523) BP: (126-140)/(75-82) 126/77 (08/15 0523) SpO2:  [82 %-93 %] 82 % (08/15 0735) Weight change:  Last BM Date: 02/28/16  Intake/Output from previous day: 08/14 0701 - 08/15 0700 In: 720 [P.O.:720] Out: -   PHYSICAL EXAM General appearance: alert, cooperative and mild distress Resp: rhonchi bilaterally Cardio: regular rate and rhythm, S1, S2 normal, no murmur, click, rub or gallop GI: soft, non-tender; bowel sounds normal; no masses,  no organomegaly  Lab Results:  Results for orders placed or performed during the hospital encounter of 02/25/16 (from the past 48 hour(s))  Glucose, capillary     Status: Abnormal   Collection Time: 02/27/16 11:55 AM  Result Value Ref Range   Glucose-Capillary 158 (H) 65 - 99 mg/dL   Comment 1 Notify RN   Glucose, capillary     Status: Abnormal   Collection Time: 02/27/16  4:31 PM  Result Value Ref Range   Glucose-Capillary 210 (H) 65 - 99 mg/dL   Comment 1 Notify RN    Comment 2 Document in Chart   Glucose, capillary     Status: Abnormal   Collection Time: 02/27/16  8:21 PM  Result Value Ref Range   Glucose-Capillary 164 (H) 65 - 99 mg/dL   Comment 1 Notify RN    Comment 2 Document in Chart   CBC     Status: Abnormal   Collection Time: 02/28/16  6:03 AM  Result Value Ref Range   WBC 23.3 (H) 4.0 - 10.5 K/uL   RBC 3.95 (L) 4.22 - 5.81 MIL/uL   Hemoglobin 8.5 (L) 13.0 - 17.0 g/dL   HCT 28.8 (L) 39.0 - 52.0 %   MCV 72.9 (L) 78.0 - 100.0 fL   MCH 21.5 (L) 26.0 - 34.0 pg   MCHC 29.5 (L) 30.0 - 36.0 g/dL   RDW 15.7 (H) 11.5 - 15.5 %   Platelets 303 150 - 400 K/uL  Basic metabolic panel      Status: Abnormal   Collection Time: 02/28/16  6:03 AM  Result Value Ref Range   Sodium 136 135 - 145 mmol/L   Potassium 4.1 3.5 - 5.1 mmol/L   Chloride 94 (L) 101 - 111 mmol/L   CO2 36 (H) 22 - 32 mmol/L   Glucose, Bld 155 (H) 65 - 99 mg/dL   BUN 24 (H) 6 - 20 mg/dL   Creatinine, Ser 0.94 0.61 - 1.24 mg/dL   Calcium 8.5 (L) 8.9 - 10.3 mg/dL   GFR calc non Af Amer >60 >60 mL/min   GFR calc Af Amer >60 >60 mL/min    Comment: (NOTE) The eGFR has been calculated using the CKD EPI equation. This calculation has not been validated in all clinical situations. eGFR's persistently <60 mL/min signify possible Chronic Kidney Disease.    Anion gap 6 5 - 15  Glucose, capillary     Status: Abnormal   Collection Time: 02/28/16  7:36 AM  Result Value Ref Range   Glucose-Capillary 136 (H) 65 - 99 mg/dL  Blood gas, arterial     Status: Abnormal   Collection Time: 02/28/16  9:16 AM  Result Value  Ref Range   O2 Content 5.0 L/min   Delivery systems NASAL CANNULA    pH, Arterial 7.41 7.350 - 7.450   pCO2 arterial 51.8 (H) 35.0 - 45.0 mmHg   pO2, Arterial 58.4 (L) 80.0 - 100.0 mmHg   Bicarbonate 31.2 (H) 20.0 - 24.0 mEq/L   TCO2 10.7 0 - 100 mmol/L   Acid-Base Excess 8.0 (H) 0.0 - 2.0 mmol/L   O2 Saturation 87.9 %   Patient temperature 98.6    Collection site RIGHT RADIAL    Drawn by [phone]    Sample type ARTERIAL DRAW    Allens test (pass/fail) PASS PASS  Glucose, capillary     Status: Abnormal   Collection Time: 02/28/16 11:16 AM  Result Value Ref Range   Glucose-Capillary 176 (H) 65 - 99 mg/dL  Glucose, capillary     Status: Abnormal   Collection Time: 02/28/16  4:16 PM  Result Value Ref Range   Glucose-Capillary 115 (H) 65 - 99 mg/dL  Glucose, capillary     Status: Abnormal   Collection Time: 02/28/16  8:59 PM  Result Value Ref Range   Glucose-Capillary 216 (H) 65 - 99 mg/dL   Comment 1 Notify RN    Comment 2 Document in Chart   Procalcitonin     Status: None   Collection Time:  02/29/16  5:51 AM  Result Value Ref Range   Procalcitonin 0.11 ng/mL    Comment:        Interpretation: PCT (Procalcitonin) <= 0.5 ng/mL: Systemic infection (sepsis) is not likely. Local bacterial infection is possible. (NOTE)         ICU PCT Algorithm               Non ICU PCT Algorithm    ----------------------------     ------------------------------         PCT < 0.25 ng/mL                 PCT < 0.1 ng/mL     Stopping of antibiotics            Stopping of antibiotics       strongly encouraged.               strongly encouraged.    ----------------------------     ------------------------------       PCT level decrease by               PCT < 0.25 ng/mL       >= 80% from peak PCT       OR PCT 0.25 - 0.5 ng/mL          Stopping of antibiotics                                             encouraged.     Stopping of antibiotics           encouraged.    ----------------------------     ------------------------------       PCT level decrease by              PCT >= 0.25 ng/mL       < 80% from peak PCT        AND PCT >= 0.5 ng/mL            Continuin g antibiotics  encouraged.       Continuing antibiotics            encouraged.    ----------------------------     ------------------------------     PCT level increase compared          PCT > 0.5 ng/mL         with peak PCT AND          PCT >= 0.5 ng/mL             Escalation of antibiotics                                          strongly encouraged.      Escalation of antibiotics        strongly encouraged.   Vancomycin, trough     Status: None   Collection Time: 02/29/16  5:51 AM  Result Value Ref Range   Vancomycin Tr 20 15 - 20 ug/mL  Basic metabolic panel     Status: Abnormal   Collection Time: 02/29/16  5:51 AM  Result Value Ref Range   Sodium 136 135 - 145 mmol/L   Potassium 3.9 3.5 - 5.1 mmol/L   Chloride 94 (L) 101 - 111 mmol/L   CO2 35 (H) 22 - 32 mmol/L   Glucose, Bld 153  (H) 65 - 99 mg/dL   BUN 23 (H) 6 - 20 mg/dL   Creatinine, Ser 0.90 0.61 - 1.24 mg/dL   Calcium 8.7 (L) 8.9 - 10.3 mg/dL   GFR calc non Af Amer >60 >60 mL/min   GFR calc Af Amer >60 >60 mL/min    Comment: (NOTE) The eGFR has been calculated using the CKD EPI equation. This calculation has not been validated in all clinical situations. eGFR's persistently <60 mL/min signify possible Chronic Kidney Disease.    Anion gap 7 5 - 15  Glucose, capillary     Status: Abnormal   Collection Time: 02/29/16  7:37 AM  Result Value Ref Range   Glucose-Capillary 143 (H) 65 - 99 mg/dL    ABGS  Recent Labs  02/28/16 0916  PHART 7.41  PO2ART 58.4*  TCO2 10.7  HCO3 31.2*   CULTURES Recent Results (from the past 240 hour(s))  Blood Culture (routine x 2)     Status: None (Preliminary result)   Collection Time: 02/25/16  4:23 PM  Result Value Ref Range Status   Specimen Description BLOOD  Final   Special Requests NONE  Final   Culture NO GROWTH 3 DAYS  Final   Report Status PENDING  Incomplete  Blood Culture (routine x 2)     Status: None (Preliminary result)   Collection Time: 02/25/16  4:26 PM  Result Value Ref Range Status   Specimen Description BLOOD  Final   Special Requests NONE  Final   Culture NO GROWTH 3 DAYS  Final   Report Status PENDING  Incomplete  MRSA PCR Screening     Status: None   Collection Time: 02/25/16  9:55 PM  Result Value Ref Range Status   MRSA by PCR NEGATIVE NEGATIVE Final    Comment:        The GeneXpert MRSA Assay (FDA approved for NASAL specimens only), is one component of a comprehensive MRSA colonization surveillance program. It is not intended to diagnose MRSA infection nor to guide or monitor treatment for MRSA infections.  Culture, expectorated sputum-assessment     Status: None   Collection Time: 02/26/16  3:37 AM  Result Value Ref Range Status   Specimen Description SPU  Final   Special Requests Normal  Final   Sputum evaluation THIS  SPECIMEN IS ACCEPTABLE FOR SPUTUM CULTURE  Final   Report Status 02/26/2016 FINAL  Final  Culture, respiratory (NON-Expectorated)     Status: None (Preliminary result)   Collection Time: 02/26/16  3:37 AM  Result Value Ref Range Status   Specimen Description SPU  Final   Special Requests NONE  Final   Gram Stain   Final    FEW WBC PRESENT, PREDOMINANTLY PMN FEW SQUAMOUS EPITHELIAL CELLS PRESENT ABUNDANT BUDDING YEAST SEEN RARE GRAM NEGATIVE COCCI IN PAIRS    Culture   Final    CULTURE REINCUBATED FOR BETTER GROWTH Performed at [facility]    Report Status PENDING  Incomplete   Studies/Results: Dg Chest 2 View  Result Date: 02/28/2016 CLINICAL DATA:  Shortness of breath. EXAM: CHEST  2 VIEW COMPARISON:  02/25/2016 FINDINGS: Again noted are old left rib fractures with deformity of the left hemithorax. Left costophrenic angle is not completely visualized. Lungs are clear without airspace disease or pulmonary edema. Heart and mediastinum are within normal limits. Mild degenerative changes in the thoracic spine. No large pleural effusions. Retrocardiac density is suggestive for a hiatal hernia. IMPRESSION: No acute cardiopulmonary disease. Chronic deformity of the left hemithorax due to old displaced left rib fractures. Electronically Signed   By: Markus Daft M.D.   On: 02/28/2016 11:55    Medications:  Prior to Admission:  Prescriptions Prior to Admission  Medication Sig Dispense Refill Last Dose  . albuterol (PROVENTIL HFA;VENTOLIN HFA) 108 (90 Base) MCG/ACT inhaler Inhale 2 puffs into the lungs every 6 (six) hours as needed for wheezing or shortness of breath.   02/25/2016 at Unknown time  . ALPRAZolam (XANAX) 0.5 MG tablet Take 0.5 mg by mouth at bedtime as needed for sleep.   02/24/2016  . amitriptyline (ELAVIL) 10 MG tablet Take 10 mg by mouth at bedtime.   02/24/2016  . aspirin EC 81 MG tablet Take 81 mg by mouth daily.   02/25/2016  . cyclobenzaprine (FLEXERIL) 10 MG tablet  Take 10 mg by mouth 3 (three) times daily.   02/25/2016  . gabapentin (NEURONTIN) 600 MG tablet Take 600 mg by mouth 3 (three) times daily.   02/25/2016  . hydrochlorothiazide (HYDRODIURIL) 25 MG tablet Take 25 mg by mouth daily.   02/25/2016  . mometasone-formoterol (DULERA) 100-5 MCG/ACT AERO Inhale 2 puffs into the lungs 2 (two) times daily.   02/25/2016  . omeprazole (PRILOSEC) 20 MG capsule Take 40 mg by mouth daily.   02/25/2016  . ondansetron (ZOFRAN) 4 MG tablet Take 4 mg by mouth every 8 (eight) hours as needed for nausea or vomiting.   02/25/2016  . oxyCODONE-acetaminophen (PERCOCET) 7.5-325 MG per tablet Take 1 tablet by mouth every 4 (four) hours as needed for severe pain. 20 tablet 0 02/25/2016  . pravastatin (PRAVACHOL) 40 MG tablet Take 40 mg by mouth daily.   02/25/2016  . predniSONE (DELTASONE) 10 MG tablet Take 4 tab for 2 days, 3 tab for 2 days, 2 tab for 2 tabs, 1 tab for 2 days and then stop 20 tablet 0 02/25/2016  . traZODone (DESYREL) 100 MG tablet Take 100 mg by mouth at bedtime.   02/24/2016  . umeclidinium-vilanterol (ANORO ELLIPTA) 62.5-25 MCG/INH AEPB Inhale 1 puff  into the lungs daily. 60 each 5 02/25/2016  . benzonatate (TESSALON) 200 MG capsule Take 1 capsule (200 mg total) by mouth 3 (three) times daily as needed for cough. (Patient not taking: Reported on 02/26/2016) 30 capsule 0 Not Taking at Unknown time   Scheduled: . amitriptyline  10 mg Oral QHS  . antiseptic oral rinse  7 mL Mouth Rinse BID  . aspirin EC  81 mg Oral Daily  . budesonide (PULMICORT) nebulizer solution  0.25 mg Nebulization BID  . cefTRIAXone (ROCEPHIN)  IV  2 g Intravenous Q24H  . cyclobenzaprine  10 mg Oral TID  . enoxaparin (LOVENOX) injection  40 mg Subcutaneous Q24H  . gabapentin  600 mg Oral TID  . guaiFENesin  600 mg Oral BID  . hydrochlorothiazide  25 mg Oral Daily  . insulin aspart  0-20 Units Subcutaneous TID WC  . insulin aspart  0-5 Units Subcutaneous QHS  . insulin detemir  10 Units  Subcutaneous QHS  . ipratropium-albuterol  3 mL Nebulization Q6H  . methylPREDNISolone (SOLU-MEDROL) injection  40 mg Intravenous Q8H  . pantoprazole  40 mg Oral Daily  . pneumococcal 23 valent vaccine  0.5 mL Intramuscular Tomorrow-1000  . pravastatin  40 mg Oral q1800  . traZODone  100 mg Oral QHS  . vancomycin  1,000 mg Intravenous Q8H   Continuous:  FSF:SELTRVUYEBXID **OR** acetaminophen, albuterol, ALPRAZolam, ondansetron **OR** ondansetron (ZOFRAN) IV, ondansetron, oxyCODONE-acetaminophen, polyethylene glycol  Assesment: He has COPD exacerbation and acute on chronic hypoxic respiratory failure from that. He is clinically significantly improved today. He is down to 4 L and in fact his oxygen is off at the moment. His exam is better. Principal Problem:   Acute on chronic respiratory failure with hypoxia (HCC) Active Problems:   COPD exacerbation (HCC)   Elevated blood sugar   Chronic pain    Plan: Continue treatments.    LOS: 3 days   Melesio Madara L 02/29/2016, 8:01 AM

## 2016-02-29 NOTE — Progress Notes (Signed)
[facility] PROGRESS NOTE  Sean Thomas [id number] DOB: January 21, 1961 DOA: 02/25/2016 PCP: Kerin Perna, NP  Interim summary and HPI 55 y.o. male with a history of chronic respiratory failure on 3 L oxygen nasal cannula, 75+-pack-year history of smoking, COPD, chronic pain secondary to DDD and multiple rib fractures (fractures are healed now), hypertension, hyperlipidemia. Patient was just recently incarcerated for 1 year has been off cigarettes for approximately 9 months. Patient seen for 45 days of worsening shortness of breath, wheezing, cough with green mucus production.   Admitted for acute on chronic resp failure with hypoxia due to COPD exacerbation and bronchiectasis.  Assessment/Plan: 1-acute on chronic resp failure with hypoxia: due to COPD exacerbation and bronchiectasis  -will continue IV steroids, duoneb and antibiotics (rocephin and vanc now) -continue O2 supplementation; weaning off as tolerated (uses 3-4L at baseline). Currently on high flow supplementation using 4-5 L. -will continue tx with pulmicort and use flutter valve -continue use of mucinex -follow clinical response  -symptoms appear improved today. -Consider DC vanc over next 24 hours. -Appreciate pulmonary input and recommendations.  2-hyperglycemia: w/o hx of diabetes -probably related to steroids;  -fair control. -will use SSI and levemir -A1C 5.9  3-Bipolar disorder: -no SI or hallucinations -will continue home medication regimen   4-HTN: -will continue HCTZ -BP fairly well control  5-HLD -will continue statins   6-GERD: -will use PPI  7-chronic pain syndrome: -will continue current home analgesics regimen   Code Status: Full Family Communication: no family at bedside Disposition Plan: improved, hope for DC home in 24 hours   Consultants:  Pulmonary service   Procedures:  See below for x-ray reports   Antibiotics:  Rocephin 8/11>>>  zithromax 8/11  >>8/13  vancomycin 8/12>>  HPI/Subjective: Afebrile, no complaints of CP. Intermittently coughing.  Objective: Vitals:   02/28/16 2300 02/29/16 0523  BP: 135/75 126/77  Pulse: 100 81  Resp: 20 17  Temp: 98 F (36.7 C) 97.7 F (36.5 C)    Intake/Output Summary (Last 24 hours) at 02/29/16 1327 Last data filed at 02/28/16 1708  Gross per 24 hour  Intake              240 ml  Output                0 ml  Net              240 ml   Filed Weights   02/25/16 1546 02/25/16 2016  Weight: 98 kg (216 lb) 94.8 kg (209 lb 1.6 oz)    Exam:   General:  AA Ox3  Cardiovascular: S1 and S2, no rubs, no gallops   Respiratory: some improvement in air movement, scattered wheezing, no rales. Positive rhonchi  Abdomen: soft, NT, ND, positive BS  Musculoskeletal: no edema or cyanosis   Data Reviewed: Basic Metabolic Panel:  Recent Labs Lab 02/25/16 1600 02/26/16 0501 02/28/16 0603 02/29/16 0551  NA 135 137 136 136  K 3.6 3.5 4.1 3.9  CL 102 102 94* 94*  CO2 26 27 36* 35*  GLUCOSE 182* 244* 155* 153*  BUN 19 21* 24* 23*  CREATININE 1.14 1.05 0.94 0.90  CALCIUM 8.4* 8.4* 8.5* 8.7*   Liver Function Tests:  Recent Labs Lab 02/25/16 1600  AST 15  ALT 11*  ALKPHOS 51  BILITOT 0.4  PROT 7.1  ALBUMIN 3.6   CBC:  Recent Labs Lab 02/25/16 1600 02/26/16 0501 02/28/16 0603  WBC 23.7* 19.7* 23.3*  NEUTROABS 20.3*  --   --  HGB 9.5* 8.9* 8.5*  HCT 32.8* 30.3* 28.8*  MCV 73.2* 72.7* 72.9*  PLT 280 232 303   CBG:  Recent Labs Lab 02/28/16 1116 02/28/16 1616 02/28/16 2059 02/29/16 0737 02/29/16 1155  GLUCAP 176* 115* 216* 143* 249*    Recent Results (from the past 240 hour(s))  Blood Culture (routine x 2)     Status: None (Preliminary result)   Collection Time: 02/25/16  4:23 PM  Result Value Ref Range Status   Specimen Description BLOOD  Final   Special Requests NONE  Final   Culture NO GROWTH 3 DAYS  Final   Report Status PENDING  Incomplete  Blood  Culture (routine x 2)     Status: None (Preliminary result)   Collection Time: 02/25/16  4:26 PM  Result Value Ref Range Status   Specimen Description BLOOD  Final   Special Requests NONE  Final   Culture NO GROWTH 3 DAYS  Final   Report Status PENDING  Incomplete  MRSA PCR Screening     Status: None   Collection Time: 02/25/16  9:55 PM  Result Value Ref Range Status   MRSA by PCR NEGATIVE NEGATIVE Final    Comment:        The GeneXpert MRSA Assay (FDA approved for NASAL specimens only), is one component of a comprehensive MRSA colonization surveillance program. It is not intended to diagnose MRSA infection nor to guide or monitor treatment for MRSA infections.   Culture, expectorated sputum-assessment     Status: None   Collection Time: 02/26/16  3:37 AM  Result Value Ref Range Status   Specimen Description SPU  Final   Special Requests Normal  Final   Sputum evaluation THIS SPECIMEN IS ACCEPTABLE FOR SPUTUM CULTURE  Final   Report Status 02/26/2016 FINAL  Final  Culture, respiratory (NON-Expectorated)     Status: None (Preliminary result)   Collection Time: 02/26/16  3:37 AM  Result Value Ref Range Status   Specimen Description SPU  Final   Special Requests NONE  Final   Gram Stain   Final    FEW WBC PRESENT, PREDOMINANTLY PMN FEW SQUAMOUS EPITHELIAL CELLS PRESENT ABUNDANT BUDDING YEAST SEEN RARE GRAM NEGATIVE COCCI IN PAIRS    Culture   Final    CULTURE REINCUBATED FOR BETTER GROWTH Performed at [facility]    Report Status PENDING  Incomplete     Studies: Dg Chest 2 View  Result Date: 02/28/2016 CLINICAL DATA:  Shortness of breath. EXAM: CHEST  2 VIEW COMPARISON:  02/25/2016 FINDINGS: Again noted are old left rib fractures with deformity of the left hemithorax. Left costophrenic angle is not completely visualized. Lungs are clear without airspace disease or pulmonary edema. Heart and mediastinum are within normal limits. Mild degenerative changes in the  thoracic spine. No large pleural effusions. Retrocardiac density is suggestive for a hiatal hernia. IMPRESSION: No acute cardiopulmonary disease. Chronic deformity of the left hemithorax due to old displaced left rib fractures. Electronically Signed   By: Markus Daft M.D.   On: 02/28/2016 11:55    Scheduled Meds: . amitriptyline  10 mg Oral QHS  . antiseptic oral rinse  7 mL Mouth Rinse BID  . aspirin EC  81 mg Oral Daily  . budesonide (PULMICORT) nebulizer solution  0.25 mg Nebulization BID  . cefTRIAXone (ROCEPHIN)  IV  2 g Intravenous Q24H  . cyclobenzaprine  10 mg Oral TID  . enoxaparin (LOVENOX) injection  40 mg Subcutaneous Q24H  . gabapentin  600 mg Oral TID  . guaiFENesin  600 mg Oral BID  . hydrochlorothiazide  25 mg Oral Daily  . insulin aspart  0-20 Units Subcutaneous TID WC  . insulin aspart  0-5 Units Subcutaneous QHS  . insulin detemir  10 Units Subcutaneous QHS  . ipratropium-albuterol  3 mL Nebulization Q6H  . methylPREDNISolone (SOLU-MEDROL) injection  40 mg Intravenous Q8H  . pantoprazole  40 mg Oral Daily  . pravastatin  40 mg Oral q1800  . traZODone  100 mg Oral QHS  . vancomycin  1,000 mg Intravenous Q8H    Time spent: 25 minutes   [facility] Pager [phone] If 7PM-7AM, please contact night-coverage at [email or web address], password [facility] 02/29/2016, 1:27 PM  LOS: 3 days

## 2016-03-01 DIAGNOSIS — R7309 Other abnormal glucose: Secondary | ICD-10-CM

## 2016-03-01 DIAGNOSIS — J441 Chronic obstructive pulmonary disease with (acute) exacerbation: Principal | ICD-10-CM

## 2016-03-01 DIAGNOSIS — J9621 Acute and chronic respiratory failure with hypoxia: Secondary | ICD-10-CM

## 2016-03-01 LAB — BASIC METABOLIC PANEL
Anion gap: 8 (ref 5–15)
BUN: 26 mg/dL — AB (ref 6–20)
CALCIUM: 8.7 mg/dL — AB (ref 8.9–10.3)
CO2: 36 mmol/L — ABNORMAL HIGH (ref 22–32)
CREATININE: 1.01 mg/dL (ref 0.61–1.24)
Chloride: 94 mmol/L — ABNORMAL LOW (ref 101–111)
GFR calc non Af Amer: >60 mL/min (ref >60)
Glucose, Bld: 161 mg/dL — ABNORMAL HIGH (ref 65–99)
Potassium: 4 mmol/L (ref 3.5–5.1)
SODIUM: 138 mmol/L (ref 135–145)

## 2016-03-01 LAB — CULTURE, BLOOD (ROUTINE X 2)
CULTURE: NO GROWTH
Culture: NO GROWTH

## 2016-03-01 LAB — CULTURE, RESPIRATORY

## 2016-03-01 LAB — GLUCOSE, CAPILLARY: GLUCOSE-CAPILLARY: 145 mg/dL — AB (ref 65–99)

## 2016-03-01 LAB — CULTURE, RESPIRATORY W GRAM STAIN

## 2016-03-01 MED ORDER — LEVOFLOXACIN 750 MG PO TABS: 750.0000 mg | ORAL_TABLET | Freq: Every day | ORAL | 0 refills | Status: AC

## 2016-03-01 MED ORDER — PREDNISONE 10 MG (21) PO TBPK: 10.0000 mg | ORAL_TABLET | Freq: Every day | ORAL | Status: DC

## 2016-03-01 NOTE — Progress Notes (Signed)
He says he feels much better. He is planning for discharge today. He has a pulmonary physician that he sees as an outpatient. I will sign off. Thanks for allowing me to participate in his care

## 2016-03-01 NOTE — Progress Notes (Signed)
Inpatient Diabetes Program Recommendations  AACE/ADA: New Consensus Statement on Inpatient Glycemic Control (2015)  Target Ranges:  Prepandial:   less than 140 mg/dL      Peak postprandial:   less than 180 mg/dL (1-2 hours)      Critically ill patients:  140 - 180 mg/dL   Results for ZHI, CHAPLA (MRN [id number]) as of 03/01/2016 11:30  Ref. Range 02/29/2016 07:37 02/29/2016 11:55 02/29/2016 16:20 02/29/2016 21:06 03/01/2016 07:44  Glucose-Capillary Latest Ref Range: 65 - 99 mg/dL 143 (H) 249 (H) 126 (H) 204 (H) 145 (H)   Review of Glycemic Control  Diabetes history: NO Outpatient Diabetes medications: NA Current orders for Inpatient glycemic control: None  Inpatient Diabetes Program Recommendations: Insulin - Meal Coverage: If steroids are continued, please consider ordering Novolog 3 units TID with meals for meal coverage.  Thanks, Barnie Alderman, RN, MSN, CDE Diabetes Coordinator Inpatient Diabetes Program [phone] (Team Pager from [place] to [place]) [phone] ([facility] office) [phone] [facility] office) [phone] [facility] office)

## 2016-03-01 NOTE — Progress Notes (Signed)
SATURATION QUALIFICATIONS: (This note is used to comply with regulatory documentation for home oxygen)  Patient Saturations on Room Air at Rest = 84%  Patient Saturations on 4 Liters of oxygen while at Rest = 95%  Please briefly explain why patient needs home oxygen:

## 2016-03-01 NOTE — Discharge Summary (Signed)
Physician Discharge Summary  Sean Thomas [id number] DOB: [birth year] DOA: 02/25/2016  PCP: Kerin Perna, NP  Admit date: 02/25/2016 Discharge date: 03/01/2016  Admitted From: Home Disposition:  Home  Recommendations for Outpatient Follow-up:  1. Follow up with PCP in 1-2 weeks 2. Please obtain BMP/CBC in one week 3. Please follow up on the following pending results:   Discharge Condition:Improved CODE STATUS:Full Diet recommendation: Regular   Brief/Interim Summary: See presenting H and P from 8/11 for details. Briefly, 55 y.o.malewith a history of chronic respiratory failure on 3 L oxygen nasal cannula, 75+-pack-year history of smoking, COPD, chronic pain secondary to DDD and multiple rib fractures (fractures are healed now), hypertension, hyperlipidemia. Patient was just recently incarcerated for 1 year has been off cigarettes for approximately 9 months. Patient seen for 45 days of worsening shortness of breath, wheezing, cough with green mucus production.  1-acute on chronic resp failure with hypoxia: due to COPD exacerbation and bronchiectasis  -Patient continued on IV steroids, duoneb and antibiotics -continued O2 supplementation; weaning off as tolerated (uses 3-4L at baseline). Weaned to baseline home O2. -Patient was continued with pulmicort and use flutter valve -Pulmonary consulted. Patient cleared for discharge per pulmonary -Patient continue steroid taper on discharge  2-hyperglycemia: w/o hx of diabetes -probably related to steroids;  -fair control. -will use SSI and levemir -A1C 5.9  3-Bipolar disorder: -no SI or hallucinations -will continue home medication regimen  -Stable at time of discharge  4-HTN: -will continue HCTZ -BP fairly well control  5-HLD Continued  statins   6-GERD: -will use PPI  7-chronic pain syndrome: -Patient was continued on current home analgesics regimen   Discharge Diagnoses:  Principal Problem:   Acute on  chronic respiratory failure with hypoxia (HCC) Active Problems:   COPD exacerbation (HCC)   Elevated blood sugar   Chronic pain        Medication List    STOP taking these medications   predniSONE 10 MG tablet Commonly known as:  DELTASONE Replaced by:  predniSONE 10 MG (21) Tbpk tablet     TAKE these medications   albuterol 108 (90 Base) MCG/ACT inhaler Commonly known as:  PROVENTIL HFA;VENTOLIN HFA Inhale 2 puffs into the lungs every 6 (six) hours as needed for wheezing or shortness of breath.   ALPRAZolam 0.5 MG tablet Commonly known as:  XANAX Take 0.5 mg by mouth at bedtime as needed for sleep.   amitriptyline 10 MG tablet Commonly known as:  ELAVIL Take 10 mg by mouth at bedtime.   aspirin EC 81 MG tablet Take 81 mg by mouth daily.   benzonatate 200 MG capsule Commonly known as:  TESSALON Take 1 capsule (200 mg total) by mouth 3 (three) times daily as needed for cough.   cyclobenzaprine 10 MG tablet Commonly known as:  FLEXERIL Take 10 mg by mouth 3 (three) times daily.   gabapentin 600 MG tablet Commonly known as:  NEURONTIN Take 600 mg by mouth 3 (three) times daily.   hydrochlorothiazide 25 MG tablet Commonly known as:  HYDRODIURIL Take 25 mg by mouth daily.   levofloxacin 750 MG tablet Commonly known as:  LEVAQUIN Take 1 tablet (750 mg total) by mouth daily.   mometasone-formoterol 100-5 MCG/ACT Aero Commonly known as:  DULERA Inhale 2 puffs into the lungs 2 (two) times daily.   omeprazole 20 MG capsule Commonly known as:  PRILOSEC Take 40 mg by mouth daily.   ondansetron 4 MG tablet Commonly known as:  ZOFRAN Take  4 mg by mouth every 8 (eight) hours as needed for nausea or vomiting.   oxyCODONE-acetaminophen 7.5-325 MG tablet Commonly known as:  PERCOCET Take 1 tablet by mouth every 4 (four) hours as needed for severe pain.   pravastatin 40 MG tablet Commonly known as:  PRAVACHOL Take 40 mg by mouth daily.   predniSONE 10 MG (21)  Tbpk tablet Commonly known as:  STERAPRED UNI-PAK 21 TAB Take 1 tablet (10 mg total) by mouth daily. Taper dose: 60mg  po x 3 days, then 40mg  po x 3 days, then 20mg  po x 3 days, then 10mg  po x 3 days, then 5mg  po x 3 days, then stop, zero refills Replaces:  predniSONE 10 MG tablet   traZODone 100 MG tablet Commonly known as:  DESYREL Take 100 mg by mouth at bedtime.   umeclidinium-vilanterol 62.5-25 MCG/INH Aepb Commonly known as:  ANORO ELLIPTA Inhale 1 puff into the lungs daily.      Follow-up Information    EDWARDS, MICHELLE P, NP. Schedule an appointment as soon as possible for a visit in 1 week(s).   Specialty:  Internal Medicine Contact information: [address] [place] [address] [phone]          Allergies  Allergen Reactions  . Advil [Ibuprofen] Diarrhea  . Aleve [Naproxen Sodium] Nausea And Vomiting    diarrhea    Consultations:  Pulmonary  Procedures/Studies: Dg Chest 2 View  Result Date: 02/28/2016 CLINICAL DATA:  Shortness of breath. EXAM: CHEST  2 VIEW COMPARISON:  02/25/2016 FINDINGS: Again noted are old left rib fractures with deformity of the left hemithorax. Left costophrenic angle is not completely visualized. Lungs are clear without airspace disease or pulmonary edema. Heart and mediastinum are within normal limits. Mild degenerative changes in the thoracic spine. No large pleural effusions. Retrocardiac density is suggestive for a hiatal hernia. IMPRESSION: No acute cardiopulmonary disease. Chronic deformity of the left hemithorax due to old displaced left rib fractures. Electronically Signed   By: Markus Daft M.D.   On: 02/28/2016 11:55   Dg Chest 2 View  Result Date: 02/25/2016 CLINICAL DATA:  Shortness of breath, productive cough. EXAM: CHEST  2 VIEW COMPARISON:  Radiographs of December 24, 2015. FINDINGS: The heart size and mediastinal contours are within normal limits. Both lungs are clear. No pneumothorax or pleural effusion is noted. Old  severely displaced left rib fractures are again noted. IMPRESSION: No active cardiopulmonary disease. Electronically Signed   By: Marijo Conception, M.D.   On: 02/25/2016 16:54   Dg Chest 2 View  Result Date: 02/24/2016 CLINICAL DATA:  Cough and congestion. EXAM: CHEST  2 VIEW COMPARISON:  12/29/2014. FINDINGS: Cardiomegaly with normal pulmonary vascularity. No focal infiltrate. No pleural effusion or pneumothorax. Right basilar pleural thickening noted consistent scarring. Small sliding hiatal hernia. Old bilateral rib fractures noted. IMPRESSION: 1.  No acute cardiopulmonary disease. 2. Small sliding hiatal hernia . 3.  Old bilateral rib fractures.  No pneumothorax. Electronically Signed   By: Marcello Moores  Register   On: 02/24/2016 08:10     Subjective: Eager to go home today  Discharge Exam: Vitals:   02/29/16 2107 03/01/16 0550  BP: (!) 142/93 106/61  Pulse: (!) 102 88  Resp: 20 20  Temp: 98 F (36.7 C) 97.8 F (36.6 C)   Vitals:   02/29/16 2107 03/01/16 0550 03/01/16 0741 03/01/16 0747  BP: (!) 142/93 106/61    Pulse: (!) 102 88    Resp: 20 20  Temp: 98 F (36.7 C) 97.8 F (36.6 C)    TempSrc: Oral Oral    SpO2: 94% 91% 91% 96%  Weight:      Height:        General: Pt is alert, awake, not in acute distress Cardiovascular: RRR, S1/S2 +, no rubs, no gallops Respiratory: CTA bilaterally,End expiratory wheezing, normal respiratory effort  Abdominal: Soft, NT, ND, bowel sounds + Extremities: no edema, no cyanosis   The results of significant diagnostics from this hospitalization (including imaging, microbiology, ancillary and laboratory) are listed below for reference.     Microbiology: Recent Results (from the past 240 hour(s))  Blood Culture (routine x 2)     Status: None   Collection Time: 02/25/16  4:23 PM  Result Value Ref Range Status   Specimen Description BLOOD RIGHT ARM  Final   Special Requests BOTTLES DRAWN AEROBIC AND ANAEROBIC [place]  Final   Culture NO  GROWTH 5 DAYS  Final   Report Status 03/01/2016 FINAL  Final  Blood Culture (routine x 2)     Status: None   Collection Time: 02/25/16  4:26 PM  Result Value Ref Range Status   Specimen Description BLOOD RIGHT HAND  Final   Special Requests BOTTLES DRAWN AEROBIC AND ANAEROBIC [place]  Final   Culture NO GROWTH 5 DAYS  Final   Report Status 03/01/2016 FINAL  Final  MRSA PCR Screening     Status: None   Collection Time: 02/25/16  9:55 PM  Result Value Ref Range Status   MRSA by PCR NEGATIVE NEGATIVE Final    Comment:        The GeneXpert MRSA Assay (FDA approved for NASAL specimens only), is one component of a comprehensive MRSA colonization surveillance program. It is not intended to diagnose MRSA infection nor to guide or monitor treatment for MRSA infections.   Culture, expectorated sputum-assessment     Status: None   Collection Time: 02/26/16  3:37 AM  Result Value Ref Range Status   Specimen Description SPU  Final   Special Requests Normal  Final   Sputum evaluation THIS SPECIMEN IS ACCEPTABLE FOR SPUTUM CULTURE  Final   Report Status 02/26/2016 FINAL  Final  Culture, respiratory (NON-Expectorated)     Status: None (Preliminary result)   Collection Time: 02/26/16  3:37 AM  Result Value Ref Range Status   Specimen Description SPU  Final   Special Requests NONE  Final   Gram Stain   Final    FEW WBC PRESENT, PREDOMINANTLY PMN FEW SQUAMOUS EPITHELIAL CELLS PRESENT ABUNDANT BUDDING YEAST SEEN RARE GRAM NEGATIVE COCCI IN PAIRS Performed at [facility]    Culture MODERATE YEAST  Final   Report Status PENDING  Incomplete     Labs: BNP (last 3 results) No results for input(s): BNP in the last 8760 hours. Basic Metabolic Panel:  Recent Labs Lab 02/25/16 1600 02/26/16 0501 02/28/16 0603 02/29/16 0551 03/01/16 0619  NA 135 137 136 136 138  K 3.6 3.5 4.1 3.9 4.0  CL 102 102 94* 94* 94*  CO2 26 27 36* 35* 36*  GLUCOSE 182* 244* 155* 153* 161*  BUN 19  21* 24* 23* 26*  CREATININE 1.14 1.05 0.94 0.90 1.01  CALCIUM 8.4* 8.4* 8.5* 8.7* 8.7*   Liver Function Tests:  Recent Labs Lab 02/25/16 1600  AST 15  ALT 11*  ALKPHOS 51  BILITOT 0.4  PROT 7.1  ALBUMIN 3.6   No results for input(s): LIPASE, AMYLASE  in the last 168 hours. No results for input(s): AMMONIA in the last 168 hours. CBC:  Recent Labs Lab 02/25/16 1600 02/26/16 0501 02/28/16 0603  WBC 23.7* 19.7* 23.3*  NEUTROABS 20.3*  --   --   HGB 9.5* 8.9* 8.5*  HCT 32.8* 30.3* 28.8*  MCV 73.2* 72.7* 72.9*  PLT 280 232 303   Cardiac Enzymes: No results for input(s): CKTOTAL, CKMB, CKMBINDEX, TROPONINI in the last 168 hours. BNP: Invalid input(s): POCBNP CBG:  Recent Labs Lab 02/29/16 0737 02/29/16 1155 02/29/16 1620 02/29/16 2106 03/01/16 0744  GLUCAP 143* 249* 126* 204* 145*   D-Dimer No results for input(s): DDIMER in the last 72 hours. Hgb A1c No results for input(s): HGBA1C in the last 72 hours. Lipid Profile No results for input(s): CHOL, HDL, LDLCALC, TRIG, CHOLHDL, LDLDIRECT in the last 72 hours. Thyroid function studies No results for input(s): TSH, T4TOTAL, T3FREE, THYROIDAB in the last 72 hours.  Invalid input(s): FREET3 Anemia work up No results for input(s): VITAMINB12, FOLATE, FERRITIN, TIBC, IRON, RETICCTPCT in the last 72 hours. Urinalysis    Component Value Date/Time   COLORURINE YELLOW 02/25/2016 1635   APPEARANCEUR CLEAR 02/25/2016 1635   LABSPEC 1.010 02/25/2016 1635   PHURINE 6.5 02/25/2016 1635   GLUCOSEU NEGATIVE 02/25/2016 1635   HGBUR NEGATIVE 02/25/2016 1635   BILIRUBINUR NEGATIVE 02/25/2016 1635   KETONESUR TRACE (A) 02/25/2016 1635   PROTEINUR 30 (A) 02/25/2016 1635   NITRITE NEGATIVE 02/25/2016 1635   LEUKOCYTESUR NEGATIVE 02/25/2016 1635   Sepsis Labs Invalid input(s): PROCALCITONIN,  WBC,  LACTICIDVEN Microbiology Recent Results (from the past 240 hour(s))  Blood Culture (routine x 2)     Status: None    Collection Time: 02/25/16  4:23 PM  Result Value Ref Range Status   Specimen Description BLOOD RIGHT ARM  Final   Special Requests BOTTLES DRAWN AEROBIC AND ANAEROBIC [place]  Final   Culture NO GROWTH 5 DAYS  Final   Report Status 03/01/2016 FINAL  Final  Blood Culture (routine x 2)     Status: None   Collection Time: 02/25/16  4:26 PM  Result Value Ref Range Status   Specimen Description BLOOD RIGHT HAND  Final   Special Requests BOTTLES DRAWN AEROBIC AND ANAEROBIC [place]  Final   Culture NO GROWTH 5 DAYS  Final   Report Status 03/01/2016 FINAL  Final  MRSA PCR Screening     Status: None   Collection Time: 02/25/16  9:55 PM  Result Value Ref Range Status   MRSA by PCR NEGATIVE NEGATIVE Final    Comment:        The GeneXpert MRSA Assay (FDA approved for NASAL specimens only), is one component of a comprehensive MRSA colonization surveillance program. It is not intended to diagnose MRSA infection nor to guide or monitor treatment for MRSA infections.   Culture, expectorated sputum-assessment     Status: None   Collection Time: 02/26/16  3:37 AM  Result Value Ref Range Status   Specimen Description SPU  Final   Special Requests Normal  Final   Sputum evaluation THIS SPECIMEN IS ACCEPTABLE FOR SPUTUM CULTURE  Final   Report Status 02/26/2016 FINAL  Final  Culture, respiratory (NON-Expectorated)     Status: None (Preliminary result)   Collection Time: 02/26/16  3:37 AM  Result Value Ref Range Status   Specimen Description SPU  Final   Special Requests NONE  Final   Gram Stain   Final    FEW WBC  PRESENT, PREDOMINANTLY PMN FEW SQUAMOUS EPITHELIAL CELLS PRESENT ABUNDANT BUDDING YEAST SEEN RARE GRAM NEGATIVE COCCI IN PAIRS Performed at [facility]    Culture MODERATE YEAST  Final   Report Status PENDING  Incomplete     SIGNED:   Damyan Corne, Orpah Melter, MD  [facility] 03/01/2016, 10:33 AM  If 7PM-7AM, please contact night-coverage [email or web address] Password  [email or web address]

## 2016-03-01 NOTE — Care Management Note (Signed)
Case Management Note  Patient Details  Name: Sean Thomas MRN: [id number] Date of Birth: 09/08/1960  Subjective/Objective:                  Pt admitted with COPD. Pt is from home with family. He is ind with ADL's. He has medicaid and PCP. He has home oxygen through [place]. [employer] has called and needs a new oxgyen order and qualifying sats. Those have been documented and [employer] called. Mandy from [place] will obtain this info from chart. Pt does not have port tank at hospital for transport. It was offered to have a tank delivered to the hospital for transport. Pt refused and states he would be okay until he got home.   Action/Plan: Pt discharging today.    Expected Discharge Date:     03/01/2016             Expected Discharge Plan:  Home/Self Care  In-House Referral:  NA  Discharge planning Services  CM Consult  Post Acute Care Choice:  NA Choice offered to:  NA  DME Arranged:    DME Agency:     HH Arranged:    HH Agency:     Status of Service:  Completed, signed off  If discussed at [employer] of Stay Meetings, dates discussed:    Additional Comments:  Sherald Barge, RN 03/01/2016, 10:58 AM

## 2016-03-01 NOTE — Progress Notes (Signed)
Pt discharged home today per Dr. Wyline Copas.  Pt's IV site D/C'd and WDL.  Pt's VSS.  Pt provided with home medication list, discharge instructions and prescriptions.  Verbalized understanding.  Pt left floor in stable condition accompanied by RN.

## 2016-04-27 ENCOUNTER — Encounter: Payer: Self-pay | Admitting: Emergency Medicine

## 2016-04-27 ENCOUNTER — Ambulatory Visit (INDEPENDENT_AMBULATORY_CARE_PROVIDER_SITE_OTHER): Payer: Medicaid Other | Admitting: Emergency Medicine

## 2016-04-27 DIAGNOSIS — J438 Other emphysema: Secondary | ICD-10-CM

## 2016-04-27 MED ORDER — UMECLIDINIUM-VILANTEROL 62.5-25 MCG/INH IN AEPB: 1.0000 | INHALATION_SPRAY | Freq: Every day | RESPIRATORY_TRACT | 0 refills | Status: AC

## 2016-04-27 NOTE — Patient Instructions (Signed)
Need to restart a scheduled inhaled medication. We will attempt to confirm which inhale medicine will be covered by Medicaid. We will then start this. In the meantime take Anoro once a day (samples)  Continue albuterol 2 puffs up to every 4 hours if needed for shortness of breath.  Continue oxygen as you are using it Flu shot up-to-date You need to stop smoking. We agreed that by next visit you would cut down to half a pack of cigarettes daily. Follow with Dr Lamonte Sakai in 3 months or sooner if you have any problems.
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
## 2016-04-27 NOTE — Assessment & Plan Note (Signed)
Not currently on a schedule bronchodilator. We need to restart. We will investigate to see which BD's are covered.   Need to restart a scheduled inhaled medication. We will attempt to confirm which inhale medicine will be covered by Medicaid. We will then start this. In the meantime take Anoro once a day (samples)  Continue albuterol 2 puffs up to every 4 hours if needed for shortness of breath.  Continue oxygen as you are using it Flu shot up-to-date You need to stop smoking. We agreed that by next visit you would cut down to half a pack of cigarettes daily. Follow with Dr Lamonte Sakai in 3 months or sooner if you have any problems.

## 2016-04-27 NOTE — Addendum Note (Signed)
Addended by: Parke Poisson E on: 04/27/2016 04:24 PM   Modules accepted: Orders

## 2016-05-05 ENCOUNTER — Encounter: Payer: Self-pay | Admitting: Internal Medicine

## 2016-05-23 ENCOUNTER — Encounter: Payer: Self-pay | Admitting: Gastroenterology

## 2016-05-23 ENCOUNTER — Other Ambulatory Visit: Payer: Self-pay

## 2016-05-23 ENCOUNTER — Ambulatory Visit (INDEPENDENT_AMBULATORY_CARE_PROVIDER_SITE_OTHER): Payer: Medicaid Other | Admitting: Gastroenterology

## 2016-05-23 VITALS — BP 136/93 | HR 86 | Temp 97.6°F | Ht 75.0 in | Wt 201.2 lb

## 2016-05-23 DIAGNOSIS — D509 Iron deficiency anemia, unspecified: Secondary | ICD-10-CM | POA: Diagnosis not present

## 2016-05-23 DIAGNOSIS — R1310 Dysphagia, unspecified: Secondary | ICD-10-CM | POA: Insufficient documentation

## 2016-05-23 DIAGNOSIS — R1013 Epigastric pain: Secondary | ICD-10-CM | POA: Insufficient documentation

## 2016-05-23 DIAGNOSIS — R1319 Other dysphagia: Secondary | ICD-10-CM

## 2016-05-23 DIAGNOSIS — R112 Nausea with vomiting, unspecified: Secondary | ICD-10-CM | POA: Insufficient documentation

## 2016-05-23 HISTORY — DX: Nausea with vomiting, unspecified: R11.2

## 2016-05-23 LAB — CBC WITH DIFFERENTIAL/PLATELET
BASOS ABS: 79 {cells}/uL (ref 0–200)
Basophils Relative: 1 %
EOS ABS: 395 {cells}/uL (ref 15–500)
Eosinophils Relative: 5 %
HCT: 41.7 % (ref 38.5–50.0)
Hemoglobin: 12.1 g/dL — ABNORMAL LOW (ref 13.2–17.1)
Lymphocytes Relative: 40 %
Lymphs Abs: 3160 cells/uL (ref 850–3900)
MCH: 22.1 pg — AB (ref 27.0–33.0)
MCHC: 29 g/dL — AB (ref 32.0–36.0)
MCV: 76.1 fL — AB (ref 80.0–100.0)
MONOS PCT: 8 %
MPV: 9.8 fL (ref 7.5–12.5)
Monocytes Absolute: 632 cells/uL (ref 200–950)
Neutro Abs: 3634 cells/uL (ref 1500–7800)
Neutrophils Relative %: 46 %
PLATELETS: 313 10*3/uL (ref 140–400)
RBC: 5.48 MIL/uL (ref 4.20–5.80)
RDW: 24.4 % — AB (ref 11.0–15.0)
WBC: 7.9 10*3/uL (ref 3.8–10.8)

## 2016-05-23 LAB — FERRITIN: Ferritin: 22 ng/mL (ref 20–380)

## 2016-05-23 NOTE — Patient Instructions (Signed)
1. Colonoscopy and upper endoscopy as scheduled. 2. Lab work within the next 1-2 days to recheck your anemia.

## 2016-05-23 NOTE — Assessment & Plan Note (Addendum)
55 year old gentleman with progressive microcytic anemia likely due to IDA. First noted in August. Heme negative at that time. Denies overt GI bleeding. Symptomatically he has postprandial discomfort in the lower chest as if food is sticking. Feels like food is sticking and at times associated with vomiting up food. Patient has never had a colonoscopy. Would be concerned about potential for malignancy however other etiology such as peptic ulcer disease, AVMs remain in differential. Recommend colonoscopy, EGD with possible esophageal dilation with deep sedation in the OR given polypharmacy with Dr. Gala Romney in the near future.  I have discussed the risks, alternatives, benefits with regards to but not limited to the risk of reaction to medication, bleeding, infection, perforation and the patient is agreeable to proceed. Written consent to be obtained.  Update CBC, iron/tibc, ferritin now.
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
## 2016-05-24 LAB — IRON AND TIBC
%SAT: 97 % — ABNORMAL HIGH (ref 15–60)
Iron: 347 ug/dL — ABNORMAL HIGH (ref 50–180)
TIBC: 357 ug/dL (ref 250–425)
UIBC: <17 ug/dL — ABNORMAL LOW (ref 125–400)

## 2016-05-25 NOTE — Progress Notes (Signed)
His H/H are improving. His iron is elevated but normal ferritin.  Stop iron supplements. Await procedures. Repeat CBC, iron/tibc, ferritin in 4 weeks. If iron still elevated at that time, consider further work up.

## 2016-05-29 ENCOUNTER — Other Ambulatory Visit: Payer: Self-pay | Admitting: Gastroenterology

## 2016-05-29 DIAGNOSIS — D509 Iron deficiency anemia, unspecified: Secondary | ICD-10-CM

## 2016-05-30 ENCOUNTER — Other Ambulatory Visit: Payer: Self-pay

## 2016-05-30 DIAGNOSIS — D509 Iron deficiency anemia, unspecified: Secondary | ICD-10-CM

## 2016-05-30 NOTE — Progress Notes (Signed)
cc'd to pcp 

## 2016-06-06 NOTE — Patient Instructions (Signed)
Sean Thomas  06/06/2016     @PREFPERIOPPHARMACY @   Your procedure is scheduled on 06/15/2016  Report to [facility] at  800  A.M.  Call this number if you have problems the morning of surgery:  [phone]   Remember:  Do not eat food or drink liquids after midnight.  Take these medicines the morning of surgery with A SIP OF WATER  Elavil, flexaril, neurontin, prilosec, oxycodone. Take your inhaler before you come.   Do not wear jewelry, make-up or nail polish.  Do not wear lotions, powders, or perfumes, or deoderant.  Do not shave 48 hours prior to surgery.  Men may shave face and neck.  Do not bring valuables to the hospital.  [facility] is not responsible for any belongings or valuables.  Contacts, dentures or bridgework may not be worn into surgery.  Leave your suitcase in the car.  After surgery it may be brought to your room.  For patients admitted to the hospital, discharge time will be determined by your treatment team.  Patients discharged the day of surgery will not be allowed to drive home.   Name and phone number of your driver:   family Special instructions:  Follow the diet and prep instructions given to you by Dr Roseanne Kaufman office.  Please read over the following fact sheets that you were given. Anesthesia Post-op Instructions and Care and Recovery After Surgery       Esophagogastroduodenoscopy Introduction Esophagogastroduodenoscopy (EGD) is a procedure to examine the lining of the esophagus, stomach, and first part of the small intestine (duodenum). This procedure is done to check for problems such as inflammation, bleeding, ulcers, or growths. During this procedure, a long, flexible, lighted tube with a camera attached (endoscope) is inserted down the throat. Tell a health care provider about:  Any allergies you have.  All medicines you are taking, including vitamins, herbs, eye drops, creams, and over-the-counter  medicines.  Any problems you or family members have had with anesthetic medicines.  Any blood disorders you have.  Any surgeries you have had.  Any medical conditions you have.  Whether you are pregnant or may be pregnant. What are the risks? Generally, this is a safe procedure. However, problems may occur, including:  Infection.  Bleeding.  A tear (perforation) in the esophagus, stomach, or duodenum.  Trouble breathing.  Excessive sweating.  Spasms of the larynx.  A slowed heartbeat.  Low blood pressure. What happens before the procedure?  Follow instructions from your health care provider about eating or drinking restrictions.  Ask your health care provider about:  Changing or stopping your regular medicines. This is especially important if you are taking diabetes medicines or blood thinners.  Taking medicines such as aspirin and ibuprofen. These medicines can thin your blood. Do not take these medicines before your procedure if your health care provider instructs you not to.  Plan to have someone take you home after the procedure.  If you wear dentures, be ready to remove them before the procedure. What happens during the procedure?  To reduce your risk of infection, your health care team will wash or sanitize their hands.  An IV tube will be put in a vein in your hand or arm. You will get medicines and fluids through this tube.  You will be given one or more of the following:  A medicine to help you relax (sedative).  A medicine to numb the area (local anesthetic). This medicine may be sprayed into your throat. It will make you feel more comfortable and keep you from gagging or coughing during the procedure.  A medicine for pain.  A mouth guard may be placed in your mouth to protect your teeth and to keep you from biting on the endoscope.  You will be asked to lie on your left side.  The endoscope will be lowered down your throat into your esophagus,  stomach, and duodenum.  Air will be put into the endoscope. This will help your health care provider see better.  The lining of your esophagus, stomach, and duodenum will be examined.  Your health care provider may:  Take a tissue sample so it can be looked at in a lab (biopsy).  Remove growths.  Remove objects (foreign bodies) that are stuck.  Treat any bleeding with medicines or other devices that stop tissue from bleeding.  Widen (dilate) or stretch narrowed areas of your esophagus and stomach.  The endoscope will be taken out. The procedure may vary among health care providers and hospitals. What happens after the procedure?  Your blood pressure, heart rate, breathing rate, and blood oxygen level will be monitored often until the medicines you were given have worn off.  Do not eat or drink anything until the numbing medicine has worn off and your gag reflex has returned. This information is not intended to replace advice given to you by your health care provider. Make sure you discuss any questions you have with your health care provider. Document Released: 11/03/2004 Document Revised: 12/09/2015 Document Reviewed: 05/27/2015  2017 Elsevier Esophagogastroduodenoscopy, Care After Introduction Refer to this sheet in the next few weeks. These instructions provide you with information about caring for yourself after your procedure. Your health care provider may also give you more specific instructions. Your treatment has been planned according to current medical practices, but problems sometimes occur. Call your health care provider if you have any problems or questions after your procedure. What can I expect after the procedure? After the procedure, it is common to have:  A sore throat.  Nausea.  Bloating.  Dizziness.  Fatigue. Follow these instructions at home:  Do not eat or drink anything until the numbing medicine (local anesthetic) has worn off and your gag reflex  has returned. You will know that the local anesthetic has worn off when you can swallow comfortably.  Do not drive for 24 hours if you received a medicine to help you relax (sedative).  If your health care provider took a tissue sample for testing during the procedure, make sure to get your test results. This is your responsibility. Ask your health care provider or the department performing the test when your results will be ready.  Keep all follow-up visits as told by your health care provider. This is important. Contact a health care provider if:  You cannot stop coughing.  You are not urinating.  You are urinating less than usual. Get help right away if:  You have trouble swallowing.  You cannot eat or drink.  You have throat or chest pain that gets worse.  You are dizzy or light-headed.  You faint.  You have nausea or vomiting.  You have chills.  You have a fever.  You have severe abdominal pain.  You have black, tarry, or bloody stools. This information is not intended to replace advice given to you by your health care provider. Make sure you  discuss any questions you have with your health care provider. Document Released: 06/19/2012 Document Revised: 12/09/2015 Document Reviewed: 05/27/2015  2017 Elsevier  Esophageal Dilatation Esophageal dilatation is a procedure to open a blocked or narrowed part of the esophagus. The esophagus is the long tube in your throat that carries food and liquid from your mouth to your stomach. The procedure is also called esophageal dilation. You may need this procedure if you have a buildup of scar tissue in your esophagus that makes it difficult, painful, or even impossible to swallow. This can be caused by gastroesophageal reflux disease (GERD). In rare cases, people need this procedure because they have cancer of the esophagus or a problem with the way food moves through the esophagus. Sometimes you may need to have another dilatation  to enlarge the opening of the esophagus gradually. Tell a health care provider about:  Any allergies you have.  All medicines you are taking, including vitamins, herbs, eye drops, creams, and over-the-counter medicines.  Any problems you or family members have had with anesthetic medicines.  Any blood disorders you have.  Any surgeries you have had.  Any medical conditions you have.  Any antibiotic medicines you are required to take before dental procedures. What are the risks? Generally, this is a safe procedure. However, problems can occur and include:  Bleeding from a tear in the lining of the esophagus.  A hole (perforation) in the esophagus. What happens before the procedure?  Do not eat or drink anything after midnight on the night before the procedure or as directed by your health care provider.  Ask your health care provider about changing or stopping your regular medicines. This is especially important if you are taking diabetes medicines or blood thinners.  Plan to have someone take you home after the procedure. What happens during the procedure?  You will be given a medicine that makes you relaxed and sleepy (sedative).  A medicine may be sprayed or gargled to numb the back of the throat.  Your health care provider can use various instruments to do an esophageal dilatation. During the procedure, the instrument used will be placed in your mouth and passed down into your esophagus. Options include:  Simple dilators. This instrument is carefully placed in the esophagus to stretch it.  Guided wire bougies. In this method, a flexible tube (endoscope) is used to insert a wire into the esophagus. The dilator is passed over this wire to enlarge the esophagus. Then the wire is removed.  Balloon dilators. An endoscope with a small balloon at the end is passed down into the esophagus. Inflating the balloon gently stretches the esophagus and opens it up. What happens after  the procedure?  Your blood pressure, heart rate, breathing rate, and blood oxygen level will be monitored often until the medicines you were given have worn off.  Your throat may feel slightly sore and will probably still feel numb. This will improve slowly over time.  You will not be allowed to eat or drink until the throat numbness has resolved.  If this is a same-day procedure, you may be allowed to go home once you have been able to drink, urinate, and sit on the edge of the bed without nausea or dizziness.  If this is a same-day procedure, you should have a friend or family member with you for the next 24 hours after the procedure. This information is not intended to replace advice given to you by your health care provider. Make  sure you discuss any questions you have with your health care provider. Document Released: 08/24/2005 Document Revised: 12/09/2015 Document Reviewed: 11/12/2013 Elsevier Interactive Patient Education  2017 [place].  Colonoscopy, Adult A colonoscopy is an exam to look at the entire large intestine. During the exam, a lubricated, bendable tube is inserted into the anus and then passed into the rectum, colon, and other parts of the large intestine. A colonoscopy is often done as a part of normal colorectal screening or in response to certain symptoms, such as anemia, persistent diarrhea, abdominal pain, and blood in the stool. The exam can help screen for and diagnose medical problems, including:  Tumors.  Polyps.  Inflammation.  Areas of bleeding. Tell a health care provider about:  Any allergies you have.  All medicines you are taking, including vitamins, herbs, eye drops, creams, and over-the-counter medicines.  Any problems you or family members have had with anesthetic medicines.  Any blood disorders you have.  Any surgeries you have had.  Any medical conditions you have.  Any problems you have had passing stool. What are the  risks? Generally, this is a safe procedure. However, problems may occur, including:  Bleeding.  A tear in the intestine.  A reaction to medicines given during the exam.  Infection (rare). What happens before the procedure? Eating and drinking restrictions  Follow instructions from your health care provider about eating and drinking, which may include:  A few days before the procedure - follow a low-fiber diet. Avoid nuts, seeds, dried fruit, raw fruits, and vegetables.  1-3 days before the procedure - follow a clear liquid diet. Drink only clear liquids, such as clear broth or bouillon, black coffee or tea, clear juice, clear soft drinks or sports drinks, gelatin desert, and popsicles. Avoid any liquids that contain red or purple dye.  On the day of the procedure - do not eat or drink anything during the 2 hours before the procedure, or within the time period that your health care provider recommends. Bowel prep  If you were prescribed an oral bowel prep to clean out your colon:  Take it as told by your health care provider. Starting the day before your procedure, you will need to drink a large amount of medicated liquid. The liquid will cause you to have multiple loose stools until your stool is almost clear or light green.  If your skin or anus gets irritated from diarrhea, you may use these to relieve the irritation:  Medicated wipes, such as adult wet wipes with aloe and vitamin E.  A skin soothing-product like petroleum jelly.  If you vomit while drinking the bowel prep, take a break for up to 60 minutes and then begin the bowel prep again. If vomiting continues and you cannot take the bowel prep without vomiting, call your health care provider. General instructions  Ask your health care provider about changing or stopping your regular medicines. This is especially important if you are taking diabetes medicines or blood thinners.  Plan to have someone take you home from the  hospital or clinic. What happens during the procedure?  An IV tube may be inserted into one of your veins.  You will be given medicine to help you relax (sedative).  To reduce your risk of infection:  Your health care team will wash or sanitize their hands.  Your anal area will be washed with soap.  You will be asked to lie on your side with your knees bent.  Your health  care provider will lubricate a long, thin, flexible tube. The tube will have a camera and a light on the end.  The tube will be inserted into your anus.  The tube will be gently eased through your rectum and colon.  Air will be delivered into your colon to keep it open. You may feel some pressure or cramping.  The camera will be used to take images during the procedure.  A small tissue sample may be removed from your body to be examined under a microscope (biopsy). If any potential problems are found, the tissue will be sent to a lab for testing.  If small polyps are found, your health care provider may remove them and have them checked for cancer cells.  The tube that was inserted into your anus will be slowly removed. The procedure may vary among health care providers and hospitals. What happens after the procedure?  Your blood pressure, heart rate, breathing rate, and blood oxygen level will be monitored until the medicines you were given have worn off.  Do not drive for 24 hours after the exam.  You may have a small amount of blood in your stool.  You may pass gas and have mild abdominal cramping or bloating due to the air that was used to inflate your colon during the exam.  It is up to you to get the results of your procedure. Ask your health care provider, or the department performing the procedure, when your results will be ready. This information is not intended to replace advice given to you by your health care provider. Make sure you discuss any questions you have with your health care  provider. Document Released: 06/30/2000 Document Revised: 01/21/2016 Document Reviewed: 09/14/2015 Elsevier Interactive Patient Education  2017 [employer] Colonoscopy, Adult, Care After This sheet gives you information about how to care for yourself after your procedure. Your health care provider may also give you more specific instructions. If you have problems or questions, contact your health care provider. What can I expect after the procedure? After the procedure, it is common to have:  A small amount of blood in your stool for 24 hours after the procedure.  Some gas.  Mild abdominal cramping or bloating. Follow these instructions at home: General instructions  For the first 24 hours after the procedure:  Do not drive or use machinery.  Do not sign important documents.  Do not drink alcohol.  Do your regular daily activities at a slower pace than normal.  Eat soft, easy-to-digest foods.  Rest often.  Take over-the-counter or prescription medicines only as told by your health care provider.  It is up to you to get the results of your procedure. Ask your health care provider, or the department performing the procedure, when your results will be ready. Relieving cramping and bloating  Try walking around when you have cramps or feel bloated.  Apply heat to your abdomen as told by your health care provider. Use a heat source that your health care provider recommends, such as a moist heat pack or a heating pad.  Place a towel between your skin and the heat source.  Leave the heat on for 20-30 minutes.  Remove the heat if your skin turns bright red. This is especially important if you are unable to feel pain, heat, or cold. You may have a greater risk of getting burned. Eating and drinking  Drink enough fluid to keep your urine clear or pale yellow.  Resume your normal  diet as instructed by your health care provider. Avoid heavy or fried foods that are hard to  digest.  Avoid drinking alcohol for as long as instructed by your health care provider. Contact a health care provider if:  You have blood in your stool 2-3 days after the procedure. Get help right away if:  You have more than a small spotting of blood in your stool.  You pass large blood clots in your stool.  Your abdomen is swollen.  You have nausea or vomiting.  You have a fever.  You have increasing abdominal pain that is not relieved with medicine. This information is not intended to replace advice given to you by your health care provider. Make sure you discuss any questions you have with your health care provider. Document Released: 02/15/2004 Document Revised: 03/27/2016 Document Reviewed: 09/14/2015 Elsevier Interactive Patient Education  2017 [place] Anesthesia is a term that refers to techniques, procedures, and medicines that help a person stay safe and comfortable during a medical procedure. Monitored anesthesia care, or sedation, is one type of anesthesia. Your anesthesia specialist may recommend sedation if you will be having a procedure that does not require you to be unconscious, such as:  Cataract surgery.  A dental procedure.  A biopsy.  A colonoscopy. During the procedure, you may receive a medicine to help you relax (sedative). There are three levels of sedation:  Mild sedation. At this level, you may feel awake and relaxed. You will be able to follow directions.  Moderate sedation. At this level, you will be sleepy. You may not remember the procedure.  Deep sedation. At this level, you will be asleep. You will not remember the procedure. The more medicine you are given, the deeper your level of sedation will be. Depending on how you respond to the procedure, the anesthesia specialist may change your level of sedation or the type of anesthesia to fit your needs. An anesthesia specialist will monitor you closely during the  procedure. Let your health care provider know about:  Any allergies you have.  All medicines you are taking, including vitamins, herbs, eye drops, creams, and over-the-counter medicines.  Any use of steroids (by mouth or as a cream).  Any problems you or family members have had with sedatives and anesthetic medicines.  Any blood disorders you have.  Any surgeries you have had.  Any medical conditions you have, such as sleep apnea.  Whether you are pregnant or may be pregnant.  Any use of cigarettes, alcohol, or street drugs. What are the risks? Generally, this is a safe procedure. However, problems may occur, including:  Getting too much medicine (oversedation).  Nausea.  Allergic reaction to medicines.  Trouble breathing. If this happens, a breathing tube may be used to help with breathing. It will be removed when you are awake and breathing on your own.  Heart trouble.  Lung trouble. Before the procedure Staying hydrated  Follow instructions from your health care provider about hydration, which may include:  Up to 2 hours before the procedure - you may continue to drink clear liquids, such as water, clear fruit juice, black coffee, and plain tea. Eating and drinking restrictions  Follow instructions from your health care provider about eating and drinking, which may include:  8 hours before the procedure - stop eating heavy meals or foods such as meat, fried foods, or fatty foods.  6 hours before the procedure - stop eating light meals or foods, such  as toast or cereal.  6 hours before the procedure - stop drinking milk or drinks that contain milk.  2 hours before the procedure - stop drinking clear liquids. Medicines  Ask your health care provider about:  Changing or stopping your regular medicines. This is especially important if you are taking diabetes medicines or blood thinners.  Taking medicines such as aspirin and ibuprofen. These medicines can thin your  blood. Do not take these medicines before your procedure if your health care provider instructs you not to. Tests and exams  You will have a physical exam.  You may have blood tests done to show:  How well your kidneys and liver are working.  How well your blood can clot.  General instructions  Plan to have someone take you home from the hospital or clinic.  If you will be going home right after the procedure, plan to have someone with you for 24 hours. What happens during the procedure?  Your blood pressure, heart rate, breathing, level of pain and overall condition will be monitored.  An IV tube will be inserted into one of your veins.  Your anesthesia specialist will give you medicines as needed to keep you comfortable during the procedure. This may mean changing the level of sedation.  The procedure will be performed. After the procedure  Your blood pressure, heart rate, breathing rate, and blood oxygen level will be monitored until the medicines you were given have worn off.  Do not drive for 24 hours if you received a sedative.  You may:  Feel sleepy, clumsy, or nauseous.  Feel forgetful about what happened after the procedure.  Have a sore throat if you had a breathing tube during the procedure.  Vomit. This information is not intended to replace advice given to you by your health care provider. Make sure you discuss any questions you have with your health care provider. Document Released: 03/29/2005 Document Revised: 12/10/2015 Document Reviewed: 10/24/2015 Elsevier Interactive Patient Education  2017 [place] POST-ANESTHESIA  IMMEDIATELY FOLLOWING SURGERY:  Do not drive or operate machinery for the first twenty four hours after surgery.  Do not make any important decisions for twenty four hours after surgery or while taking narcotic pain medications or sedatives.  If you develop intractable nausea and vomiting or a severe headache  please notify your doctor immediately.  FOLLOW-UP:  Please make an appointment with your surgeon as instructed. You do not need to follow up with anesthesia unless specifically instructed to do so.  WOUND CARE INSTRUCTIONS (if applicable):  Keep a dry clean dressing on the anesthesia/puncture wound site if there is drainage.  Once the wound has quit draining you may leave it open to air.  Generally you should leave the bandage intact for twenty four hours unless there is drainage.  If the epidural site drains for more than 36-48 hours please call the anesthesia department.  QUESTIONS?:  Please feel free to call your physician or the hospital operator if you have any questions, and they will be happy to assist you.

## 2016-06-12 ENCOUNTER — Encounter (HOSPITAL_COMMUNITY): Payer: Self-pay

## 2016-06-12 ENCOUNTER — Encounter (HOSPITAL_COMMUNITY)
Admission: RE | Admit: 2016-06-12 | Discharge: 2016-06-12 | Disposition: A | Discharge status: Home / Self Care | Payer: Medicaid Other | Source: Ambulatory Visit | Attending: Internal Medicine | Admitting: Internal Medicine

## 2016-06-12 DIAGNOSIS — Z01812 Encounter for preprocedural laboratory examination: Secondary | ICD-10-CM | POA: Insufficient documentation

## 2016-06-12 DIAGNOSIS — R1310 Dysphagia, unspecified: Secondary | ICD-10-CM | POA: Diagnosis not present

## 2016-06-12 DIAGNOSIS — D509 Iron deficiency anemia, unspecified: Secondary | ICD-10-CM | POA: Insufficient documentation

## 2016-06-12 DIAGNOSIS — R112 Nausea with vomiting, unspecified: Secondary | ICD-10-CM | POA: Diagnosis not present

## 2016-06-12 LAB — BASIC METABOLIC PANEL
Anion gap: 7 (ref 5–15)
BUN: 10 mg/dL (ref 6–20)
CHLORIDE: 99 mmol/L — AB (ref 101–111)
CO2: 29 mmol/L (ref 22–32)
Calcium: 9.2 mg/dL (ref 8.9–10.3)
Creatinine, Ser: 0.85 mg/dL (ref 0.61–1.24)
GFR calc Af Amer: >60 mL/min (ref >60)
GFR calc non Af Amer: >60 mL/min (ref >60)
GLUCOSE: 113 mg/dL — AB (ref 65–99)
POTASSIUM: 3.6 mmol/L (ref 3.5–5.1)
Sodium: 135 mmol/L (ref 135–145)

## 2016-06-12 LAB — CBC
HCT: 44.5 % (ref 39.0–52.0)
HEMOGLOBIN: 13.5 g/dL (ref 13.0–17.0)
MCH: 24.2 pg — AB (ref 26.0–34.0)
MCHC: 30.3 g/dL (ref 30.0–36.0)
MCV: 79.7 fL (ref 78.0–100.0)
Platelets: 249 10*3/uL (ref 150–400)
RBC: 5.58 MIL/uL (ref 4.22–5.81)
RDW: 23.9 % — ABNORMAL HIGH (ref 11.5–15.5)
WBC: 9.2 10*3/uL (ref 4.0–10.5)

## 2016-06-13 ENCOUNTER — Telehealth: Payer: Self-pay | Admitting: Internal Medicine

## 2016-06-13 ENCOUNTER — Other Ambulatory Visit: Payer: Self-pay

## 2016-06-13 MED ORDER — PEG-KCL-NACL-NASULF-NA ASC-C 100 G PO SOLR: 1.0000 | ORAL | 0 refills | Status: DC

## 2016-06-13 NOTE — Telephone Encounter (Signed)
Tammy from [facility] called to say that they have no received the prep prescription for the patient and he is scheduled with [facility] on Thursday this week.

## 2016-06-13 NOTE — Telephone Encounter (Signed)
Rx sent in to the pharmacy 

## 2016-06-15 ENCOUNTER — Encounter (HOSPITAL_COMMUNITY)
Admission: RE | Disposition: A | Discharge status: Home / Self Care | Payer: Self-pay | Source: Ambulatory Visit | Attending: Internal Medicine

## 2016-06-15 ENCOUNTER — Ambulatory Visit (HOSPITAL_COMMUNITY)
Admission: RE | Admit: 2016-06-15 | Discharge: 2016-06-15 | Disposition: A | Discharge status: Home / Self Care | Payer: Medicaid Other | Source: Ambulatory Visit | Attending: Internal Medicine | Admitting: Internal Medicine

## 2016-06-15 ENCOUNTER — Ambulatory Visit (HOSPITAL_COMMUNITY): Payer: Medicaid Other | Admitting: Anesthesiology

## 2016-06-15 ENCOUNTER — Encounter (HOSPITAL_COMMUNITY): Payer: Self-pay

## 2016-06-15 DIAGNOSIS — K5730 Diverticulosis of large intestine without perforation or abscess without bleeding: Secondary | ICD-10-CM | POA: Insufficient documentation

## 2016-06-15 DIAGNOSIS — K210 Gastro-esophageal reflux disease with esophagitis: Secondary | ICD-10-CM | POA: Diagnosis not present

## 2016-06-15 DIAGNOSIS — J449 Chronic obstructive pulmonary disease, unspecified: Secondary | ICD-10-CM | POA: Insufficient documentation

## 2016-06-15 DIAGNOSIS — D123 Benign neoplasm of transverse colon: Secondary | ICD-10-CM | POA: Diagnosis not present

## 2016-06-15 DIAGNOSIS — D509 Iron deficiency anemia, unspecified: Secondary | ICD-10-CM | POA: Diagnosis not present

## 2016-06-15 DIAGNOSIS — K621 Rectal polyp: Secondary | ICD-10-CM | POA: Insufficient documentation

## 2016-06-15 DIAGNOSIS — I10 Essential (primary) hypertension: Secondary | ICD-10-CM | POA: Diagnosis not present

## 2016-06-15 DIAGNOSIS — R1310 Dysphagia, unspecified: Secondary | ICD-10-CM

## 2016-06-15 DIAGNOSIS — F17210 Nicotine dependence, cigarettes, uncomplicated: Secondary | ICD-10-CM | POA: Insufficient documentation

## 2016-06-15 DIAGNOSIS — K222 Esophageal obstruction: Secondary | ICD-10-CM | POA: Insufficient documentation

## 2016-06-15 DIAGNOSIS — K209 Esophagitis, unspecified: Secondary | ICD-10-CM | POA: Diagnosis not present

## 2016-06-15 DIAGNOSIS — Z79899 Other long term (current) drug therapy: Secondary | ICD-10-CM | POA: Diagnosis not present

## 2016-06-15 DIAGNOSIS — E785 Hyperlipidemia, unspecified: Secondary | ICD-10-CM | POA: Insufficient documentation

## 2016-06-15 DIAGNOSIS — K449 Diaphragmatic hernia without obstruction or gangrene: Secondary | ICD-10-CM | POA: Insufficient documentation

## 2016-06-15 DIAGNOSIS — Z79891 Long term (current) use of opiate analgesic: Secondary | ICD-10-CM | POA: Diagnosis not present

## 2016-06-15 DIAGNOSIS — Z7982 Long term (current) use of aspirin: Secondary | ICD-10-CM | POA: Insufficient documentation

## 2016-06-15 HISTORY — PX: ESOPHAGOGASTRODUODENOSCOPY (EGD) WITH PROPOFOL: SHX5813

## 2016-06-15 HISTORY — PX: MALONEY DILATION: SHX5535

## 2016-06-15 HISTORY — PX: COLONOSCOPY WITH PROPOFOL: SHX5780

## 2016-06-15 HISTORY — PX: POLYPECTOMY: SHX5525

## 2016-06-15 SURGERY — COLONOSCOPY WITH PROPOFOL
Anesthesia: Monitor Anesthesia Care

## 2016-06-15 MED ORDER — LACTATED RINGERS IV SOLN
INTRAVENOUS | Status: DC
  Administered 2016-06-15: 08:00:00 via INTRAVENOUS

## 2016-06-15 MED ORDER — LIDOCAINE HCL (PF) 1 % IJ SOLN
INTRAMUSCULAR | Status: AC
  Filled 2016-06-15: qty 5

## 2016-06-15 MED ORDER — PROPOFOL 10 MG/ML IV BOLUS
INTRAVENOUS | Status: DC | PRN
  Administered 2016-06-15 (×3): 10 mg via INTRAVENOUS

## 2016-06-15 MED ORDER — FENTANYL CITRATE (PF) 100 MCG/2ML IJ SOLN
INTRAMUSCULAR | Status: AC
  Filled 2016-06-15: qty 2

## 2016-06-15 MED ORDER — PROPOFOL 10 MG/ML IV BOLUS
INTRAVENOUS | Status: AC
  Filled 2016-06-15: qty 20

## 2016-06-15 MED ORDER — PROPOFOL 500 MG/50ML IV EMUL
INTRAVENOUS | Status: DC | PRN
  Administered 2016-06-15: 150 ug/kg/min via INTRAVENOUS
  Administered 2016-06-15 (×2): via INTRAVENOUS

## 2016-06-15 MED ORDER — MIDAZOLAM HCL 2 MG/2ML IJ SOLN
1.0000 mg | INTRAMUSCULAR | Status: DC | PRN
  Administered 2016-06-15: 2 mg via INTRAVENOUS

## 2016-06-15 MED ORDER — MIDAZOLAM HCL 2 MG/2ML IJ SOLN
INTRAMUSCULAR | Status: AC
  Filled 2016-06-15: qty 2

## 2016-06-15 MED ORDER — FENTANYL CITRATE (PF) 100 MCG/2ML IJ SOLN
25.0000 ug | INTRAMUSCULAR | Status: AC | PRN
  Administered 2016-06-15 (×2): 25 ug via INTRAVENOUS

## 2016-06-15 MED ORDER — PROPOFOL 10 MG/ML IV BOLUS
INTRAVENOUS | Status: AC
  Filled 2016-06-15: qty 40

## 2016-06-15 MED ORDER — LIDOCAINE VISCOUS 2 % MT SOLN
OROMUCOSAL | Status: AC
  Filled 2016-06-15: qty 15

## 2016-06-15 NOTE — Anesthesia Preprocedure Evaluation (Signed)
Anesthesia Evaluation  Patient identified by MRN, date of birth, ID band Patient awake    Reviewed: Allergy & Precautions, NPO status   Airway Mallampati: II  TM Distance: >3 FB Neck ROM: Full    Dental  (+) Edentulous Upper, Edentulous Lower   Pulmonary shortness of breath, COPD, Current Smoker,    breath sounds clear to auscultation       Cardiovascular hypertension, Pt. on medications  Rhythm:Regular Rate:Normal     Neuro/Psych  Headaches,    GI/Hepatic   Endo/Other    Renal/GU      Musculoskeletal  (+) Arthritis ,   Abdominal   Peds  Hematology   Anesthesia Other Findings   Reproductive/Obstetrics                             Anesthesia Physical Anesthesia Plan  ASA: III  Anesthesia Plan: MAC   Post-op Pain Management:    Induction: Intravenous  Airway Management Planned: Simple Face Mask  Additional Equipment:   Intra-op Plan:   Post-operative Plan:   Informed Consent: I have reviewed the patients History and Physical, chart, labs and discussed the procedure including the risks, benefits and alternatives for the proposed anesthesia with the patient or authorized representative who has indicated his/her understanding and acceptance.     Plan Discussed with:   Anesthesia Plan Comments:         Anesthesia Quick Evaluation

## 2016-06-15 NOTE — Op Note (Signed)
[facility] Patient Name: Sean Thomas Procedure Date: 06/15/2016 9:25 AM MRN: [id number] Date of Birth: 1960/12/29 Attending MD: Norvel Richards , MD CSN: [id number] Age: 55 Admit Type: Outpatient Procedure:                Colonoscopy with biopsy and snare polypectomy Indications:              Iron deficiency anemia Providers:                Norvel Richards, MD, Gwenlyn Fudge RN, RN,                            Randa Spike, Technician Referring MD:              Medicines:                Propofol per Anesthesia Complications:            No immediate complications. Estimated Blood Loss:     Estimated blood loss was minimal. Procedure:                Pre-Anesthesia Assessment:                           - Prior to the procedure, a History and Physical                            was performed, and patient medications and                            allergies were reviewed. The patient's tolerance of                            previous anesthesia was also reviewed. The risks                            and benefits of the procedure and the sedation                            options and risks were discussed with the patient.                            All questions were answered, and informed consent                            was obtained. Prior Anticoagulants: The patient has                            taken no previous anticoagulant or antiplatelet                            agents. ASA Grade Assessment: II - A patient with                            mild systemic disease. After reviewing the risks  and benefits, the patient was deemed in                            satisfactory condition to undergo the procedure.                           After obtaining informed consent, the colonoscope                            was passed under direct vision. Throughout the                            procedure, the patient's blood pressure, pulse, and                             oxygen saturations were monitored continuously. The                            [id number] scope was introduced through                            the and advanced to the the cecum, identified by                            appendiceal orifice and ileocecal valve. The                            colonoscopy was performed without difficulty. The                            patient tolerated the procedure well. The quality                            of the bowel preparation was inadequate. The                            ileocecal valve, appendiceal orifice, and rectum                            were photographed. Scope In: 9:27:32 AM Scope Out: 9:51:02 AM Scope Withdrawal Time: 0 hours 12 minutes 11 seconds  Total Procedure Duration: 0 hours 23 minutes 30 seconds  Findings:      Scattered small and large-mouthed diverticula were found in the entire       colon. PREP INADEQUATE. All mucosal surfaces not seen well due to       inadequate preparation.      A 5 mm polyp was found in the hepatic flexure. The polyp was       semi-pedunculated. The polyp was removed with a cold snare. Resection       and retrieval were complete. Estimated blood loss was minimal.      A 3 mm polyp was found in the rectum. The polyp was sessile. The polyp       was removed with a cold biopsy forceps. Resection and retrieval were  complete. Estimated blood loss was minimal. . Impression:               - Preparation of the colon was inadequate.                           - Diverticulosis in the entire examined colon.                           - One 5 mm polyp at the hepatic flexure, removed                            with a cold snare. Resected and retrieved.                           - One 3 mm polyp in the rectum, removed with a cold                            biopsy forceps. Resected and retrieved. Moderate Sedation:      Moderate (conscious) sedation was personally administered by an        anesthesia professional. The following parameters were monitored: oxygen       saturation, heart rate, blood pressure, respiratory rate, EKG, adequacy       of pulmonary ventilation, and response to care. Total physician       intraservice time was 44 minutes. Recommendation:           - Repeat colonoscopy date to be determined after                            pending pathology results are reviewed for                            surveillance based on pathology results.                           - Return to GI office in 3 months.                           - Patient has a contact number available for                            emergencies. The signs and symptoms of potential                            delayed complications were discussed with the                            patient. Return to normal activities tomorrow.                            Written discharge instructions were provided to the                            patient.                           -  Resume previous diet.                           - Continue present medications. Stop omeprazole;                            begin Protonix 40 mg twice a day. See EGD report.                           - Repeat colonoscopy date to be determined after                            pending pathology results are reviewed for                            surveillance.                           - Return to GI clinic in 3 months. Procedure Code(s):        --- Professional ---                           [phone], Colonoscopy, flexible; with removal of                            tumor(s), polyp(s), or other lesion(s) by snare                            technique                           [id number], 34, Colonoscopy, flexible; with biopsy,                            single or multiple Diagnosis Code(s):        --- Professional ---                           D12.3, Benign neoplasm of transverse colon (hepatic                            flexure or splenic  flexure)                           K62.1, Rectal polyp                           D50.9, Iron deficiency anemia, unspecified                           K57.30, Diverticulosis of large intestine without                            perforation or abscess without bleeding CPT copyright 2016 American Medical Association. All rights reserved. The codes documented in this report are preliminary and upon coder review may  be revised to meet current compliance requirements.  Cristopher Estimable. Maylani Embree, MD Norvel Richards, MD 06/15/2016 10:05:10 AM This report has been signed electronically. Number of Addenda: 0

## 2016-06-15 NOTE — Interval H&P Note (Signed)
History and Physical Interval Note:  06/15/2016 8:56 AM  Sean Thomas  has presented today for surgery, with the diagnosis of N/V IDA DYSPHAGIA  The various methods of treatment have been discussed with the patient and family. After consideration of risks, benefits and other options for treatment, the patient has consented to  Procedure(s) with comments: COLONOSCOPY WITH PROPOFOL (N/A) - 930  ESOPHAGOGASTRODUODENOSCOPY (EGD) WITH PROPOFOL (N/A) MALONEY DILATION (N/A) as a surgical intervention .  The patient's history has been reviewed, patient examined, no change in status, stable for surgery.  I have reviewed the patient's chart and labs.  Questions were answered to the patient's satisfaction.     Sean Thomas  No change. EGD/ED and colonoscopy per plan.  The risks, benefits, limitations, imponderables and alternatives regarding both EGD and colonoscopy have been reviewed with the patient. Questions have been answered. All parties agreeable.

## 2016-06-15 NOTE — Anesthesia Postprocedure Evaluation (Signed)
Anesthesia Post Note  Patient: Sean Thomas  Procedure(s) Performed: Procedure(s) (LRB): COLONOSCOPY WITH PROPOFOL (N/A) ESOPHAGOGASTRODUODENOSCOPY (EGD) WITH PROPOFOL (N/A) MALONEY DILATION (N/A) POLYPECTOMY  Patient location during evaluation: PACU Anesthesia Type: MAC Level of consciousness: awake and alert and oriented Pain management: pain level controlled Vital Signs Assessment: post-procedure vital signs reviewed and stable Respiratory status: spontaneous breathing Cardiovascular status: blood pressure returned to baseline Postop Assessment: no signs of nausea or vomiting Anesthetic complications: no    Last Vitals:  Vitals:   06/15/16 1016 06/15/16 1023  BP: 110/77 106/75  Pulse:  66  Resp:  18  Temp:  36.7 C    Last Pain:  Vitals:   06/15/16 1023  TempSrc: Oral  PainSc:                  Sean Thomas

## 2016-06-15 NOTE — Op Note (Signed)
[facility] Patient Name: Sean Thomas Procedure Date: 06/15/2016 9:01 AM MRN: [id number] Date of Birth: Nov 20, 1960 Attending MD: Norvel Richards , MD CSN: [id number] Age: 55 Admit Type: Outpatient Procedure:                Upper GI endoscopy with [facility] dilation Indications:              Dysphagia Providers:                Norvel Richards, MD, Jeanann Lewandowsky. Sharon Seller, RN,                            Randa Spike, Technician Referring MD:              Medicines:                Propofol per Anesthesia Complications:            No immediate complications. Estimated Blood Loss:     Estimated blood loss was minimal. Procedure:                Pre-Anesthesia Assessment:                           - Prior to the procedure, a History and Physical                            was performed, and patient medications and                            allergies were reviewed. The patient's tolerance of                            previous anesthesia was also reviewed. The risks                            and benefits of the procedure and the sedation                            options and risks were discussed with the patient.                            All questions were answered, and informed consent                            was obtained. Prior Anticoagulants: The patient has                            taken no previous anticoagulant or antiplatelet                            agents. ASA Grade Assessment: II - A patient with                            mild systemic disease. After reviewing the risks  and benefits, the patient was deemed in                            satisfactory condition to undergo the procedure.                           After obtaining informed consent, the endoscope was                            passed under direct vision. Throughout the                            procedure, the patient's blood pressure, pulse, and    oxygen saturations were monitored continuously. The                            [id number] was introduced through the and                            advanced to the second part of duodenum. The upper                            GI endoscopy was accomplished without difficulty.                            The patient tolerated the procedure well. Scope In: 9:15:32 AM Scope Out: 9:22:55 AM Total Procedure Duration: 0 hours 7 minutes 23 seconds  Findings:      Esophagitis was found. Schatzki's ring focal area of ulceration.No       tumor. no Barrett's epithelium seen.      A large hiatal hernia was present.      The duodenal bulb and second portion of the duodenum were normal. The       scope was withdrawn. Dilation was performed with a Maloney dilator with       mild resistance at 56 Fr. The dilation site was examined following       endoscope reinsertion and showed moderate improvement in luminal       narrowing. Estimated blood loss was minimal. Impression:               - Reflux esophagitis. Shatzkis ring ?"dilated.                           - Large hiatal hernia.                           - Normal duodenal bulb and second portion of the                            duodenum.                           - No specimens collected. Moderate Sedation:      Moderate (conscious) sedation was personally administered by an       anesthesia professional. The following parameters were monitored: oxygen       saturation, heart rate, blood  pressure, respiratory rate, EKG, adequacy       of pulmonary ventilation, and response to care. Total physician       intraservice time was 44 minutes. Recommendation:           - Patient has a contact number available for                            emergencies. The signs and symptoms of potential                            delayed complications were discussed with the                            patient. Return to normal activities tomorrow.                             Written discharge instructions were provided to the                            patient.                           - Advance diet as tolerated.                           - Continue present medications.                           - No repeat upper endoscopy.                           - Return to GI office in 3 months. Stop omeprazole;                            begin Protonix 40 mg twice daily. See colonoscopy                            report. Procedure Code(s):        --- Professional ---                           [phone], Esophagogastroduodenoscopy, flexible,                            transoral; diagnostic, including collection of                            specimen(s) by brushing or washing, when performed                            (separate procedure)                           [id number], Dilation of esophagus, by unguided sound or  bougie, single or multiple passes Diagnosis Code(s):        --- Professional ---                           K20.9, Esophagitis, unspecified                           K44.9, Diaphragmatic hernia without obstruction or                            gangrene                           R13.10, Dysphagia, unspecified CPT copyright 2016 American Medical Association. All rights reserved. The codes documented in this report are preliminary and upon coder review may  be revised to meet current compliance requirements. Cristopher Estimable. Jeryl Wilbourn, MD Norvel Richards, MD 06/15/2016 9:58:11 AM This report has been signed electronically. Number of Addenda: 0

## 2016-06-15 NOTE — Transfer of Care (Signed)
Immediate Anesthesia Transfer of Care Note  Patient: Sean Thomas  Procedure(s) Performed: Procedure(s) with comments: COLONOSCOPY WITH PROPOFOL (N/A) - 930  ESOPHAGOGASTRODUODENOSCOPY (EGD) WITH PROPOFOL (N/A) MALONEY DILATION (N/A) POLYPECTOMY - colon  Patient Location: PACU  Anesthesia Type:MAC  Level of Consciousness: awake  Airway & Oxygen Therapy: Patient Spontanous Breathing and Patient connected to nasal cannula oxygen  Post-op Assessment: Report given to RN  Post vital signs: Reviewed and stable  Last Vitals:  Vitals:   06/15/16 0835 06/15/16 0900  BP: 112/76 101/69  Pulse:    Resp: 18 17  Temp:      Last Pain:  Vitals:   06/15/16 0807  TempSrc: Oral  PainSc: 8       Patients Stated Pain Goal: 10 ([id number])  Complications: No apparent anesthesia complications

## 2016-06-15 NOTE — Progress Notes (Signed)
Patient with mother , "I am ready to go, don't want to wait anymore". Instructions in envelope to patient with prescription for Protonix.
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
## 2016-06-15 NOTE — Discharge Instructions (Signed)
°Colonoscopy °Discharge Instructions ° °Read the instructions outlined below and refer to this sheet in the next few weeks. These discharge instructions provide you with general information on caring for yourself after you leave the hospital. Your doctor may also give you specific instructions. While your treatment has been planned according to the most current medical practices available, unavoidable complications occasionally occur. If you have any problems or questions after discharge, call Dr. Rourk at [phone]. °ACTIVITY °· You may resume your regular activity, but move at a slower pace for the next 24 hours.  °· Take frequent rest periods for the next 24 hours.  °· Walking will help get rid of the air and reduce the bloated feeling in your belly (abdomen).  °· No driving for 24 hours (because of the medicine (anesthesia) used during the test).   °· Do not sign any important legal documents or operate any machinery for 24 hours (because of the anesthesia used during the test).  °NUTRITION °· Drink plenty of fluids.  °· You may resume your normal diet as instructed by your doctor.  °· Begin with a light meal and progress to your normal diet. Heavy or fried foods are harder to digest and may make you feel sick to your stomach (nauseated).  °· Avoid alcoholic beverages for 24 hours or as instructed.  °MEDICATIONS °· You may resume your normal medications unless your doctor tells you otherwise.  °WHAT YOU CAN EXPECT TODAY °· Some feelings of bloating in the abdomen.  °· Passage of more gas than usual.  °· Spotting of blood in your stool or on the toilet paper.  °IF YOU HAD POLYPS REMOVED DURING THE COLONOSCOPY: °· No aspirin products for 7 days or as instructed.  °· No alcohol for 7 days or as instructed.  °· Eat a soft diet for the next 24 hours.  °FINDING OUT THE RESULTS OF YOUR TEST °Not all test results are available during your visit. If your test results are not back during the visit, make an appointment  with your caregiver to find out the results. Do not assume everything is normal if you have not heard from your caregiver or the medical facility. It is important for you to follow up on all of your test results.  °SEEK IMMEDIATE MEDICAL ATTENTION IF: °· You have more than a spotting of blood in your stool.  °· Your belly is swollen (abdominal distention).  °· You are nauseated or vomiting.  °· You have a temperature over 101.  °· You have abdominal pain or discomfort that is severe or gets worse throughout the day.  °EGD °Discharge instructions °Please read the instructions outlined below and refer to this sheet in the next few weeks. These discharge instructions provide you with general information on caring for yourself after you leave the hospital. Your doctor may also give you specific instructions. While your treatment has been planned according to the most current medical practices available, unavoidable complications occasionally occur. If you have any problems or questions after discharge, please call your doctor. °ACTIVITY °· You may resume your regular activity but move at a slower pace for the next 24 hours.  °· Take frequent rest periods for the next 24 hours.  °· Walking will help expel (get rid of) the air and reduce the bloated feeling in your abdomen.  °· No driving for 24 hours (because of the anesthesia (medicine) used during the test).  °· You may shower.  °· Do not sign any important   legal documents or operate any machinery for 24 hours (because of the anesthesia used during the test).  NUTRITION  Drink plenty of fluids.   You may resume your normal diet.   Begin with a light meal and progress to your normal diet.   Avoid alcoholic beverages for 24 hours or as instructed by your caregiver.  MEDICATIONS  You may resume your normal medications unless your caregiver tells you otherwise.  WHAT YOU CAN EXPECT TODAY  You may experience abdominal discomfort such as a feeling of fullness  or gas pains.  FOLLOW-UP  Your doctor will discuss the results of your test with you.  SEEK IMMEDIATE MEDICAL ATTENTION IF ANY OF THE FOLLOWING OCCUR:  Excessive nausea (feeling sick to your stomach) and/or vomiting.   Severe abdominal pain and distention (swelling).   Trouble swallowing.   Temperature over 101 F (37.8 C).   Rectal bleeding or vomiting of blood.     GERD information provided  Stop omeprazole; begin Protonix 40 mg twice daily  Colonoscopy preparation was poor today. Polyps were removed.  Office visit with [place] in 3 months  Further recommendations to follow pending review of pathology report      Gastroesophageal Reflux Disease, Adult Normally, food travels down the esophagus and stays in the stomach to be digested. However, when a person has gastroesophageal reflux disease (GERD), food and stomach acid move back up into the esophagus. When this happens, the esophagus becomes sore and inflamed. Over time, GERD can create small holes (ulcers) in the lining of the esophagus. What are the causes? This condition is caused by a problem with the muscle between the esophagus and the stomach (lower esophageal sphincter, or LES). Normally, the LES muscle closes after food passes through the esophagus to the stomach. When the LES is weakened or abnormal, it does not close properly, and that allows food and stomach acid to go back up into the esophagus. The LES can be weakened by certain dietary substances, medicines, and medical conditions, including:  Tobacco use.  Pregnancy.  Having a hiatal hernia.  Heavy alcohol use.  Certain foods and beverages, such as coffee, chocolate, onions, and peppermint. What increases the risk? This condition is more likely to develop in:  People who have an increased body weight.  People who have connective tissue disorders.  People who use NSAID medicines. What are the signs or symptoms? Symptoms of this condition  include:  Heartburn.  Difficult or painful swallowing.  The feeling of having a lump in the throat.  Abitter taste in the mouth.  Bad breath.  Having a large amount of saliva.  Having an upset or bloated stomach.  Belching.  Chest pain.  Shortness of breath or wheezing.  Ongoing (chronic) cough or a night-time cough.  Wearing away of tooth enamel.  Weight loss. Different conditions can cause chest pain. Make sure to see your health care provider if you experience chest pain. How is this diagnosed? Your health care provider will take a medical history and perform a physical exam. To determine if you have mild or severe GERD, your health care provider may also monitor how you respond to treatment. You may also have other tests, including:  An endoscopy toexamine your stomach and esophagus with a small camera.  A test thatmeasures the acidity level in your esophagus.  A test thatmeasures how much pressure is on your esophagus.  A barium swallow or modified barium swallow to show the shape, size, and functioning of your  esophagus. How is this treated? The goal of treatment is to help relieve your symptoms and to prevent complications. Treatment for this condition may vary depending on how severe your symptoms are. Your health care provider may recommend:  Changes to your diet.  Medicine.  Surgery. Follow these instructions at home: Diet  Follow a diet as recommended by your health care provider. This may involve avoiding foods and drinks such as:  Coffee and tea (with or without caffeine).  Drinks that containalcohol.  Energy drinks and sports drinks.  Carbonated drinks or sodas.  Chocolate and cocoa.  Peppermint and mint flavorings.  Garlic and onions.  Horseradish.  Spicy and acidic foods, including peppers, chili powder, curry powder, vinegar, hot sauces, and barbecue sauce.  Citrus fruit juices and citrus fruits, such as oranges, lemons, and  limes.  Tomato-based foods, such as red sauce, chili, salsa, and pizza with red sauce.  Fried and fatty foods, such as donuts, french fries, potato chips, and high-fat dressings.  High-fat meats, such as hot dogs and fatty cuts of red and white meats, such as rib eye steak, sausage, ham, and bacon.  High-fat dairy items, such as whole milk, butter, and cream cheese.  Eat small, frequent meals instead of large meals.  Avoid drinking large amounts of liquid with your meals.  Avoid eating meals during the 2-3 hours before bedtime.  Avoid lying down right after you eat.  Do not exercise right after you eat. General instructions  Pay attention to any changes in your symptoms.  Take over-the-counter and prescription medicines only as told by your health care provider. Do not take aspirin, ibuprofen, or other NSAIDs unless your health care provider told you to do so.  Do not use any tobacco products, including cigarettes, chewing tobacco, and e-cigarettes. If you need help quitting, ask your health care provider.  Wear loose-fitting clothing. Do not wear anything tight around your waist that causes pressure on your abdomen.  Raise (elevate) the head of your bed 6 inches (15cm).  Try to reduce your stress, such as with yoga or meditation. If you need help reducing stress, ask your health care provider.  If you are overweight, reduce your weight to an amount that is healthy for you. Ask your health care provider for guidance about a safe weight loss goal.  Keep all follow-up visits as told by your health care provider. This is important. Contact a health care provider if:  You have new symptoms.  You have unexplained weight loss.  You have difficulty swallowing, or it hurts to swallow.  You have wheezing or a persistent cough.  Your symptoms do not improve with treatment.  You have a hoarse voice. Get help right away if:  You have pain in your arms, neck, jaw, teeth, or  back.  You feel sweaty, dizzy, or light-headed.  You have chest pain or shortness of breath.  You vomit and your vomit looks like blood or coffee grounds.  You faint.  Your stool is bloody or black.  You cannot swallow, drink, or eat. This information is not intended to replace advice given to you by your health care provider. Make sure you discuss any questions you have with your health care provider. Document Released: 04/12/2005 Document Revised: 12/01/2015 Document Reviewed: 10/28/2014 Elsevier Interactive Patient Education  2017 [employer].

## 2016-06-16 ENCOUNTER — Encounter (HOSPITAL_COMMUNITY): Payer: Self-pay | Admitting: Internal Medicine

## 2016-06-26 ENCOUNTER — Encounter: Payer: Self-pay | Admitting: Internal Medicine

## 2016-06-30 ENCOUNTER — Telehealth: Payer: Self-pay | Admitting: Emergency Medicine

## 2016-06-30 NOTE — Telephone Encounter (Signed)
PA request received for Anoro. Called  [employer] at [phone] to initiate PA. PA has been approved today through 06/25/2017 PA# [id number] [place] to make aware. Nothing further needed.

## 2016-08-07 ENCOUNTER — Encounter: Payer: Self-pay | Admitting: Emergency Medicine

## 2016-08-07 ENCOUNTER — Ambulatory Visit (INDEPENDENT_AMBULATORY_CARE_PROVIDER_SITE_OTHER): Payer: Medicaid Other | Admitting: Emergency Medicine

## 2016-08-07 DIAGNOSIS — J9621 Acute and chronic respiratory failure with hypoxia: Secondary | ICD-10-CM

## 2016-08-07 DIAGNOSIS — F17200 Nicotine dependence, unspecified, uncomplicated: Secondary | ICD-10-CM | POA: Diagnosis not present

## 2016-08-07 DIAGNOSIS — J438 Other emphysema: Secondary | ICD-10-CM | POA: Diagnosis not present

## 2016-08-07 MED ORDER — NICOTINE 14 MG/24HR TD PT24: 14.0000 mg | MEDICATED_PATCH | Freq: Every day | TRANSDERMAL | 0 refills | Status: DC

## 2016-08-07 NOTE — Patient Instructions (Addendum)
We will prescribe nicotine patches to use as a tool to cut down your cigarettes to half a pack a day. Once we have achieved this we will make a plan to quit altogether.  Continue your Anoro once a day.  Take albuterol 2 puffs up to every 4 hours if needed for shortness of breath.  Follow with Dr Lamonte Sakai in 3 months or sooner if you have any problems.

## 2016-08-07 NOTE — Assessment & Plan Note (Addendum)
Severe disease, continues to smoke. We talked in detail about smoking cessation today. We talked about the ramifications, techniques to stop. We agreed to initiate nicotine patches to see if we get him down quickly to have a pack a day. Once we have achieved this we will work on cessation. Continue Anoro + SABA. He has difficulty with optimal delivery of his HFA, needs to use a nebulized form in order to get adequate treatment. We will order albuterol nebs for him.
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
## 2016-08-07 NOTE — Assessment & Plan Note (Signed)
Taking off his oxygen when he wants to walk around the house or outside. He has had some periods of presyncope and even a fall. I asked him to stop doing this, encouraged him strongly to continue his oxygen with all exertion.

## 2016-09-01 ENCOUNTER — Telehealth: Payer: Self-pay | Admitting: Emergency Medicine

## 2016-09-01 NOTE — Telephone Encounter (Signed)
Form could possibly be in RB's sign folder. RB has this folder in his possession. Will wait for him to bring this folder back to the office.

## 2016-09-04 ENCOUNTER — Telehealth: Payer: Self-pay | Admitting: Emergency Medicine

## 2016-09-04 NOTE — Telephone Encounter (Signed)
Form has been updated and returned to [place]. I have left a message making her aware of this. Nothing further was needed.

## 2016-09-04 NOTE — Telephone Encounter (Signed)
Forms have been faxed to Opal Sidles at provided fax number. I have lm to make Opal Sidles aware. Nothing further needed.

## 2016-09-12 ENCOUNTER — Telehealth: Payer: Self-pay | Admitting: Emergency Medicine

## 2016-09-12 MED ORDER — TIOTROPIUM BROMIDE-OLODATEROL 2.5-2.5 MCG/ACT IN AERS: 2.0000 | INHALATION_SPRAY | Freq: Every day | RESPIRATORY_TRACT | 5 refills | Status: DC

## 2016-09-12 NOTE — Telephone Encounter (Signed)
Rx sent to preferred pharmacy. Pt's sister is aware and voiced her understanding.  I have contacted [place] pharmacist, who confirmed that they can demonstrate inhaler for pt.  Nothing further needed.

## 2016-09-12 NOTE — Telephone Encounter (Signed)
Attempted to contact pt's sister, Joni Reining. No answer, no option to leave a message. Will try back.

## 2016-09-12 NOTE — Telephone Encounter (Signed)
Would change to [employer]

## 2016-09-12 NOTE — Telephone Encounter (Signed)
Spoke with pt's sister, Joni Reining. She states that Anoro is not being covered by Medicaid. Covered alternatives are Spiriva Handihaler or Stiolto.  RB - please advise. Thanks.

## 2016-09-13 ENCOUNTER — Ambulatory Visit (INDEPENDENT_AMBULATORY_CARE_PROVIDER_SITE_OTHER): Payer: Medicaid Other | Admitting: Gastroenterology

## 2016-09-13 ENCOUNTER — Encounter: Payer: Self-pay | Admitting: Gastroenterology

## 2016-09-13 VITALS — BP 132/83 | HR 88 | Temp 98.1°F | Ht 75.0 in | Wt 197.4 lb

## 2016-09-13 DIAGNOSIS — Z860100 Personal history of colon polyps, unspecified: Secondary | ICD-10-CM | POA: Insufficient documentation

## 2016-09-13 DIAGNOSIS — K2100 Gastro-esophageal reflux disease with esophagitis, without bleeding: Secondary | ICD-10-CM

## 2016-09-13 DIAGNOSIS — K219 Gastro-esophageal reflux disease without esophagitis: Secondary | ICD-10-CM | POA: Insufficient documentation

## 2016-09-13 DIAGNOSIS — D509 Iron deficiency anemia, unspecified: Secondary | ICD-10-CM

## 2016-09-13 NOTE — Assessment & Plan Note (Signed)
Clinically doing well on pantoprazole 40 mg daily. Continue current dose.

## 2016-09-13 NOTE — Assessment & Plan Note (Signed)
We'll clarify timing of next colonoscopy with Dr. Gala Romney given inadequate bowel prep.

## 2016-09-13 NOTE — Patient Instructions (Signed)
1. Continue pantoprazole for acid reflux. 2. Please have your labs done to follow up on your anemia.  3. I will check with Dr. Gala Romney to verify when your next colonoscopy is due.

## 2016-09-13 NOTE — Assessment & Plan Note (Signed)
Due for follow-up labs at this time. Previously heme negative. EGD and colonoscopy findings as outlined above. Further recommendations to follow.

## 2016-09-13 NOTE — Progress Notes (Signed)
Primary Care Physician: Kerin Perna, NP  Primary Gastroenterologist:  Garfield Cornea, MD   Chief Complaint  Patient presents with  . Anemia    doing ok    HPI: Sean Thomas is a 56 y.o. male here for follow-up. He was seen in November 2017 at request his PCP for evaluation of iron deficiency anemia. September 2017, hemoglobin 8.9, MCV 67.9. He was heme-negative. Only GI complaint was dysphagia. EGD in November 2017 showed esophagitis, Schatzki ring with focal area of ulceration, status post 47 French Maloney dilator with mild resistance met. Post dilation there was moderate improvement in luminal narrowing. Also large hiatal hernia. He was switched from omeprazole to pantoprazole 40 mg twice a day. Colonoscopy November 2017 with scattered diverticula, prep was inadequate. 5 mm polyp found in the hepatic flexure removed (tubular adenoma), 3 mm polyp in the rectum removed.  Clinically he states he is doing well. His swallowing is much improved. Denies heartburn, abdominal pain, constipation, diarrhea, melena, rectal bleeding. States he took his bowel prep as instructed. He denies eating solid food during the clear liquid phase.  Current Outpatient Prescriptions  Medication Sig Dispense Refill  . albuterol (PROVENTIL HFA;VENTOLIN HFA) 108 (90 Base) MCG/ACT inhaler Inhale 2 puffs into the lungs every 6 (six) hours as needed for wheezing or shortness of breath.    Marland Kitchen amitriptyline (ELAVIL) 10 MG tablet Take 10 mg by mouth at bedtime.    Marland Kitchen aspirin EC 81 MG tablet Take 81 mg by mouth daily.    . cyclobenzaprine (FLEXERIL) 10 MG tablet Take 10 mg by mouth 3 (three) times daily.    Marland Kitchen docusate sodium (COLACE) 100 MG capsule Take 100 mg by mouth daily.    . ferrous sulfate 325 (65 FE) MG tablet Take 325 mg by mouth daily with breakfast.    . hydrochlorothiazide (HYDRODIURIL) 25 MG tablet Take 25 mg by mouth daily.    . nicotine (NICODERM CQ - DOSED IN MG/24 HOURS) 14 mg/24hr patch  Place 1 patch (14 mg total) onto the skin daily. 28 patch 0  . oxyCODONE (OXY IR/ROXICODONE) 5 MG immediate release tablet Take 5 mg by mouth every 12 (twelve) hours.    . OXYGEN Inhale into the lungs. 4 liters/min    . pantoprazole (PROTONIX) 40 MG tablet Take 40 mg by mouth daily.    . simvastatin (ZOCOR) 40 MG tablet Take 40 mg by mouth every evening.     . traZODone (DESYREL) 150 MG tablet Take 150 mg by mouth at bedtime.     No current facility-administered medications for this visit.     Allergies as of 09/13/2016 - Review Complete 09/13/2016  Allergen Reaction Noted  . Advil [ibuprofen] Diarrhea 07/20/2014  . Aleve [naproxen sodium] Nausea And Vomiting 04/23/2011   Past Medical History:  Diagnosis Date  . Cervical pain 11/21/2012  . COPD (chronic obstructive pulmonary disease) ([place])   . DDD (degenerative disc disease)   . Fracture of multiple ribs 11/01/2012  . Fracture of occipital condyle ([place]) 11/21/2012  . Headache   . Hyperlipidemia   . Hypertension   . Inguinal hernia 12/19/2013  . Microcytic anemia    Noted August 2017  . Shortness of breath dyspnea    Past Surgical History:  Procedure Laterality Date  . BACK SURGERY    . COLONOSCOPY WITH PROPOFOL N/A 06/15/2016   RMR: inadequate bowel prep all mucosal surfaces not well seen. 5 mm tubular adenoma removed from the hepatic  flexure. 3 mm polyp from the rectum was hyperplastic. He had scattered diverticula  . ESOPHAGOGASTRODUODENOSCOPY (EGD) WITH PROPOFOL N/A 06/15/2016   Dr. Gala Romney: Large hiatal hernia, reflux esophagitis, Schatzki ring with focal area of ulceration, status post dilation with 19F Maloney dilator with moderate improvement in luminal narrowing.  . INGUINAL HERNIA REPAIR Left 07/24/2014   Procedure: LAPAROSCOPIC LEFT INGUINAL HERNIA REPAIR ;  Surgeon: Ralene Ok, MD;  Location: [facility];  Service: General;  Laterality: Left;  . INSERTION OF MESH Left 07/24/2014   Procedure: INSERTION OF MESH;  Surgeon: Ralene Ok, MD;  Location: [place] [facility];  Service: General;  Laterality: Left;  . LUMBAR FUSION    . MALONEY DILATION N/A 06/15/2016   Procedure: Venia Minks DILATION;  Surgeon: Daneil Dolin, MD;  [facility];  Service: Endoscopy;  Laterality: N/A;  . POLYPECTOMY  06/15/2016   Procedure: POLYPECTOMY;  Surgeon: Daneil Dolin, MD;  [facility];  Service: Endoscopy;;  colon  . right lung surgery Right    Family History  Problem Relation Age of Onset  . Diabetes Mother   . Colon cancer Neg Hx   . Liver disease Neg Hx    Social History   Social History  . Marital status: Single    Spouse name: N/A  . Number of children: N/A  . Years of education: N/A   Social History Main Topics  . Smoking status: Current Every Day Smoker    Packs/day: 0.25    Years: 37.00    Types: Cigarettes  . Smokeless tobacco: Never Used  . Alcohol use Yes     Comment: occasionally, has had two etoh beverages in the last 3 months. never drank daily  . Drug use: No     Comment: former use, none in years. cocaine and marijuana in the past  . Sexual activity: Yes    Birth control/ protection: None   Other Topics Concern  . None   Social History Narrative  . None    ROS:  General: Negative for anorexia, weight loss, fever, chills, fatigue, weakness. ENT: Negative for hoarseness, difficulty swallowing , nasal congestion. CV: Negative for chest pain, angina, palpitations, dyspnea on exertion, peripheral edema.  Respiratory: Negative for dyspnea at rest, dyspnea on exertion, cough, sputum, wheezing.  GI: See history of present illness. GU:  Negative for dysuria, hematuria, urinary incontinence, urinary frequency, nocturnal urination.  Endo: Negative for unusual weight change.    Physical Examination:   BP 132/83   Pulse 88   Temp 98.1 F (36.7 C) (Oral)   Ht '6\' 3"'  (1.905 m)   Wt 197 lb 6.4 oz (89.5 kg)   BMI 24.67 kg/m   General: Chronically ill-appearing white male, oxygen placed.   Eyes: No icterus. Mouth: Oropharyngeal mucosa moist and pink , no lesions erythema or exudate. Abdomen: Bowel sounds are normal, nontender, nondistended, no hepatosplenomegaly or masses, no abdominal bruits or hernia , no rebound or guarding.   Extremities: No lower extremity edema. No clubbing or deformities. Neuro: Alert and oriented x 4   Skin: Warm and dry, no jaundice.   Psych: Alert and cooperative, normal mood and affect.  Labs:  Lab Results  Component Value Date   CREATININE 0.85 06/12/2016   BUN 10 06/12/2016   NA 135 06/12/2016   K 3.6 06/12/2016   CL 99 (L) 06/12/2016   CO2 29 06/12/2016   Lab Results  Component Value Date   ALT 11 (L) 02/25/2016   AST  15 02/25/2016   ALKPHOS 51 02/25/2016   BILITOT 0.4 02/25/2016   Lab Results  Component Value Date   WBC 9.2 06/12/2016   HGB 13.5 06/12/2016   HCT 44.5 06/12/2016   MCV 79.7 06/12/2016   PLT 249 06/12/2016   Lab Results  Component Value Date   IRON 347 (H) 05/23/2016   TIBC 357 05/23/2016   FERRITIN 22 05/23/2016    Imaging Studies: No results found.

## 2016-09-14 ENCOUNTER — Telehealth: Payer: Self-pay | Admitting: Internal Medicine

## 2016-09-14 ENCOUNTER — Other Ambulatory Visit: Payer: Self-pay | Admitting: Emergency Medicine

## 2016-09-14 LAB — CBC WITH DIFFERENTIAL/PLATELET
Basophils Absolute: 0 cells/uL (ref 0–200)
Basophils Relative: 0 %
EOS ABS: 312 {cells}/uL (ref 15–500)
Eosinophils Relative: 3 %
HEMATOCRIT: 45.6 % (ref 38.5–50.0)
Hemoglobin: 15.2 g/dL (ref 13.2–17.1)
LYMPHS PCT: 34 %
Lymphs Abs: 3536 cells/uL (ref 850–3900)
MCH: 27.7 pg (ref 27.0–33.0)
MCHC: 33.3 g/dL (ref 32.0–36.0)
MCV: 83.1 fL (ref 80.0–100.0)
MONO ABS: 832 {cells}/uL (ref 200–950)
MONOS PCT: 8 %
MPV: 9.8 fL (ref 7.5–12.5)
NEUTROS PCT: 55 %
Neutro Abs: 5720 cells/uL (ref 1500–7800)
PLATELETS: 267 10*3/uL (ref 140–400)
RBC: 5.49 MIL/uL (ref 4.20–5.80)
RDW: 16.2 % — AB (ref 11.0–15.0)
WBC: 10.4 10*3/uL (ref 3.8–10.8)

## 2016-09-14 LAB — IRON AND TIBC
%SAT: 42 % (ref 15–60)
Iron: 149 ug/dL (ref 50–180)
TIBC: 356 ug/dL (ref 250–425)
UIBC: 207 ug/dL (ref 125–400)

## 2016-09-14 LAB — FERRITIN: Ferritin: 15 ng/mL — ABNORMAL LOW (ref 20–380)

## 2016-09-14 NOTE — Telephone Encounter (Signed)
Pt was seen yesterday, His sister, Merlene Laughter, called this morning because patient doesn't understand what LSL told him and she is wanting to speak with LSL. She is on the release of information that the patient filled out. Please call her at  [phone]

## 2016-09-14 NOTE — Telephone Encounter (Signed)
I spoke with the pts sister and answered all her questions.

## 2016-09-15 NOTE — Progress Notes (Signed)
Ferritin low but hgb much better. He will continue oral iron WITH THE EXCEPTION THAT HE NEEDS TO HOLD FOR ONE WEEK PRIOR TO HIS TCS. See OV note addendum.   Repeat ferritin, iron/tibc, cbc in 4 months.

## 2016-09-15 NOTE — Progress Notes (Signed)
Discussed with Dr. Gala Romney. Patient's bowel prep was inadequate on colonoscopy in 05/2016. He needs another colonoscopy with adequate bowel prep. His recent labs indicate ferritin somewhat lower although hgb much improved.   Please schedule colonoscopy with rmr in the OR.  Hold iron 7 days before.  He had poor prep with moviprep before so let's NOT use that.  GIVE split trilyte.  Give Linzess 144mcg daily for 4 days prior to bowel prep starts. May give samples.  TWO full days of clear liquids.

## 2016-09-18 ENCOUNTER — Other Ambulatory Visit: Payer: Self-pay

## 2016-09-18 DIAGNOSIS — Z86010 Personal history of colonic polyps: Secondary | ICD-10-CM

## 2016-09-18 DIAGNOSIS — D509 Iron deficiency anemia, unspecified: Secondary | ICD-10-CM

## 2016-09-18 MED ORDER — PEG 3350-KCL-NA BICARB-NACL 420 G PO SOLR: 4000.0000 mL | ORAL | 0 refills | Status: DC

## 2016-09-18 NOTE — Progress Notes (Signed)
CC'D TO PCP °

## 2016-09-18 NOTE — Progress Notes (Signed)
Called and spoke to pt's sister Lanny Hurst). Colonoscopy with Propofol with RMR scheduled for 10/23/16 at 8:15am. Informed her that pt would need to hold Iron for 7 days before and he would need to take Linzess 145mg  daily for 4 days before. Linzess samples placed at front desk. Instructions mailed. Trilyte rx sent to [facility]. Pre-op scheduled for 10/17/16 at 9:00am. Called pt's sister back and informed her of pre-op appt.

## 2016-09-19 ENCOUNTER — Other Ambulatory Visit: Payer: Self-pay | Admitting: Gastroenterology

## 2016-09-19 DIAGNOSIS — D509 Iron deficiency anemia, unspecified: Secondary | ICD-10-CM

## 2016-10-04 ENCOUNTER — Encounter (HOSPITAL_COMMUNITY): Payer: Self-pay | Admitting: *Deleted

## 2016-10-04 ENCOUNTER — Emergency Department (HOSPITAL_COMMUNITY): Payer: Medicaid Other

## 2016-10-04 ENCOUNTER — Emergency Department (HOSPITAL_COMMUNITY)
Admission: EM | Admit: 2016-10-04 | Discharge: 2016-10-05 | Disposition: A | Discharge status: Home / Self Care | Payer: Medicaid Other | Attending: Emergency Medicine | Admitting: Emergency Medicine

## 2016-10-04 DIAGNOSIS — I10 Essential (primary) hypertension: Secondary | ICD-10-CM | POA: Insufficient documentation

## 2016-10-04 DIAGNOSIS — J441 Chronic obstructive pulmonary disease with (acute) exacerbation: Secondary | ICD-10-CM | POA: Diagnosis not present

## 2016-10-04 DIAGNOSIS — F17210 Nicotine dependence, cigarettes, uncomplicated: Secondary | ICD-10-CM | POA: Insufficient documentation

## 2016-10-04 DIAGNOSIS — Z79899 Other long term (current) drug therapy: Secondary | ICD-10-CM | POA: Diagnosis not present

## 2016-10-04 DIAGNOSIS — Z7982 Long term (current) use of aspirin: Secondary | ICD-10-CM | POA: Insufficient documentation

## 2016-10-04 DIAGNOSIS — R0602 Shortness of breath: Secondary | ICD-10-CM | POA: Diagnosis present

## 2016-10-04 LAB — BASIC METABOLIC PANEL
Anion gap: 7 (ref 5–15)
BUN: 13 mg/dL (ref 6–20)
CALCIUM: 9 mg/dL (ref 8.9–10.3)
CHLORIDE: 97 mmol/L — AB (ref 101–111)
CO2: 31 mmol/L (ref 22–32)
CREATININE: 0.82 mg/dL (ref 0.61–1.24)
GFR calc Af Amer: >60 mL/min (ref >60)
GFR calc non Af Amer: >60 mL/min (ref >60)
Glucose, Bld: 103 mg/dL — ABNORMAL HIGH (ref 65–99)
Potassium: 3.5 mmol/L (ref 3.5–5.1)
SODIUM: 135 mmol/L (ref 135–145)

## 2016-10-04 LAB — CBC WITH DIFFERENTIAL/PLATELET
BASOS ABS: 0.1 10*3/uL (ref 0.0–0.1)
Basophils Relative: 0 %
EOS ABS: 1 10*3/uL — AB (ref 0.0–0.7)
EOS PCT: 7 %
HEMATOCRIT: 47.5 % (ref 39.0–52.0)
Hemoglobin: 15.7 g/dL (ref 13.0–17.0)
LYMPHS ABS: 4.9 10*3/uL — AB (ref 0.7–4.0)
Lymphocytes Relative: 34 %
MCH: 28.8 pg (ref 26.0–34.0)
MCHC: 33.1 g/dL (ref 30.0–36.0)
MCV: 87 fL (ref 78.0–100.0)
MONOS PCT: 7 %
Monocytes Absolute: 0.9 10*3/uL (ref 0.1–1.0)
Neutro Abs: 7.6 10*3/uL (ref 1.7–7.7)
Neutrophils Relative %: 52 %
Platelets: 266 10*3/uL (ref 150–400)
RBC: 5.46 MIL/uL (ref 4.22–5.81)
RDW: 15.3 % (ref 11.5–15.5)
WBC: 14.5 10*3/uL — AB (ref 4.0–10.5)

## 2016-10-04 MED ORDER — ACETAMINOPHEN 500 MG PO TABS
1000.0000 mg | ORAL_TABLET | Freq: Once | ORAL | Status: AC
  Administered 2016-10-04: 1000 mg via ORAL
  Filled 2016-10-04: qty 2

## 2016-10-04 MED ORDER — ALBUTEROL SULFATE (2.5 MG/3ML) 0.083% IN NEBU
2.5000 mg | INHALATION_SOLUTION | Freq: Once | RESPIRATORY_TRACT | Status: AC
  Administered 2016-10-04: 2.5 mg via RESPIRATORY_TRACT
  Filled 2016-10-04: qty 3

## 2016-10-04 MED ORDER — PREDNISONE 10 MG PO TABS: 20.0000 mg | ORAL_TABLET | Freq: Every day | ORAL | 0 refills | Status: DC

## 2016-10-04 MED ORDER — ALBUTEROL SULFATE (2.5 MG/3ML) 0.083% IN NEBU: 5.0000 mg | INHALATION_SOLUTION | Freq: Once | RESPIRATORY_TRACT | Status: DC

## 2016-10-04 MED ORDER — ALBUTEROL SULFATE (2.5 MG/3ML) 0.083% IN NEBU: 2.5000 mg | INHALATION_SOLUTION | Freq: Four times a day (QID) | RESPIRATORY_TRACT | 3 refills | Status: DC | PRN

## 2016-10-04 MED ORDER — SODIUM CHLORIDE 0.9 % IV BOLUS (SEPSIS)
500.0000 mL | Freq: Once | INTRAVENOUS | Status: AC
  Administered 2016-10-04: 500 mL via INTRAVENOUS

## 2016-10-04 MED ORDER — IPRATROPIUM-ALBUTEROL 0.5-2.5 (3) MG/3ML IN SOLN
3.0000 mL | Freq: Once | RESPIRATORY_TRACT | Status: AC
  Administered 2016-10-04: 3 mL via RESPIRATORY_TRACT
  Filled 2016-10-04: qty 3

## 2016-10-04 MED ORDER — METHYLPREDNISOLONE SODIUM SUCC 125 MG IJ SOLR
125.0000 mg | Freq: Once | INTRAMUSCULAR | Status: AC
  Administered 2016-10-04: 125 mg via INTRAVENOUS
  Filled 2016-10-04: qty 2

## 2016-10-04 NOTE — Discharge Instructions (Signed)
Chest xray showed no pneumonia. Prescription for prednisone and albuterol solution to put in a nebulizer machine. Stop smoking.

## 2016-10-04 NOTE — ED Notes (Signed)
Patient reports of headache. Spoke to Dr. Lacinda Axon and new verbal orders given.

## 2016-10-04 NOTE — ED Triage Notes (Signed)
Pt comes in with shortness of breath starting 2-3 days ago. Pt states his cough is productive. Pt wears 4L at home. Oxygen 96% in triage. Pt has inspiratory and expiratory wheezing noted in triage.

## 2016-10-04 NOTE — ED Provider Notes (Signed)
[facility] DEPT Provider Note   CSN: [id number] Arrival date & time: 10/04/16  1714     History   Chief Complaint Chief Complaint  Patient presents with  . Shortness of Breath    HPI Sean Thomas is a 56 y.o. male.  Patient with a known history of COPD presents with increased dyspnea for 2-3 days. He continues to smoke. He is on 4 L of nasal oxygen at home. He has been using his inhalers, but he does not have a nebulizer machine. No fever, sweats, chills, rusty sputum. Severity of symptoms is moderate. He is presently not on prednisone      Past Medical History:  Diagnosis Date  . Cervical pain 11/21/2012  . COPD (chronic obstructive pulmonary disease) ([place])   . DDD (degenerative disc disease)   . Fracture of multiple ribs 11/01/2012  . Fracture of occipital condyle ([place]) 11/21/2012  . Headache   . Hyperlipidemia   . Hypertension   . Inguinal hernia 12/19/2013  . Microcytic anemia    Noted August 2017  . Shortness of breath dyspnea     Patient Active Problem List   Diagnosis Date Noted  . Esophageal reflux 09/13/2016  . Hx of adenomatous colonic polyps 09/13/2016  . Esophageal dysphagia 05/23/2016  . Abdominal pain, epigastric 05/23/2016  . IDA (iron deficiency anemia) 05/23/2016  . N&V (nausea and vomiting) 05/23/2016  . Acute on chronic respiratory failure with hypoxia ([place]) 02/25/2016  . Elevated blood sugar 02/25/2016  . Chronic pain 02/25/2016  . Malnutrition of moderate degree ([place]) 12/21/2014  . Pneumothorax on right 12/20/2014  . Rib fractures 12/20/2014  . Pneumothorax, right 12/20/2014  . COPD (chronic obstructive pulmonary disease) ([place]) 12/19/2013  . Chest pain 12/19/2013  . Inguinal hernia 12/19/2013  . Fracture of occipital condyle ([place]) 11/21/2012  . Cervical pain 11/21/2012  . Fracture of multiple ribs 11/01/2012    Past Surgical History:  Procedure Laterality Date  . BACK SURGERY    . COLONOSCOPY WITH PROPOFOL N/A 06/15/2016   RMR:  inadequate bowel prep all mucosal surfaces not well seen. 5 mm tubular adenoma removed from the hepatic flexure. 3 mm polyp from the rectum was hyperplastic. He had scattered diverticula  . ESOPHAGOGASTRODUODENOSCOPY (EGD) WITH PROPOFOL N/A 06/15/2016   Dr. Gala Romney: Large hiatal hernia, reflux esophagitis, Schatzki ring with focal area of ulceration, status post dilation with 60F Maloney dilator with moderate improvement in luminal narrowing.  . INGUINAL HERNIA REPAIR Left 07/24/2014   Procedure: LAPAROSCOPIC LEFT INGUINAL HERNIA REPAIR ;  Surgeon: Ralene Ok, MD;  Location: [facility];  Service: General;  Laterality: Left;  . INSERTION OF MESH Left 07/24/2014   Procedure: INSERTION OF MESH;  Surgeon: Ralene Ok, MD;  Location: [facility];  Service: General;  Laterality: Left;  . LUMBAR FUSION    . MALONEY DILATION N/A 06/15/2016   Procedure: Venia Minks DILATION;  Surgeon: Daneil Dolin, MD;  [facility];  Service: Endoscopy;  Laterality: N/A;  . POLYPECTOMY  06/15/2016   Procedure: POLYPECTOMY;  Surgeon: Daneil Dolin, MD;  [facility];  Service: Endoscopy;;  colon  . right lung surgery Right        Home Medications    Prior to Admission medications   Medication Sig Start Date End Date Taking? Authorizing Provider  amitriptyline (ELAVIL) 10 MG tablet Take 10 mg by mouth at bedtime.   Yes Historical Provider, MD  aspirin EC 81 MG tablet Take 81 mg by mouth daily.  Yes Historical Provider, MD  cyclobenzaprine (FLEXERIL) 10 MG tablet Take 10 mg by mouth 3 (three) times daily.   Yes Historical Provider, MD  docusate sodium (COLACE) 100 MG capsule Take 100 mg by mouth daily.   Yes Historical Provider, MD  ferrous sulfate 325 (65 FE) MG tablet Take 325 mg by mouth daily with breakfast.   Yes Historical Provider, MD  hydrochlorothiazide (HYDRODIURIL) 25 MG tablet Take 25 mg by mouth daily.   Yes Historical Provider, MD  nicotine (NICODERM CQ - DOSED IN MG/24 HOURS) 14 mg/24hr  patch Place 1 patch (14 mg total) onto the skin daily. 08/07/16  Yes Collene Gobble, MD  ondansetron (ZOFRAN) 4 MG tablet Take 4 mg by mouth every 6 (six) hours as needed for nausea or vomiting.   Yes Historical Provider, MD  oxyCODONE (OXY IR/ROXICODONE) 5 MG immediate release tablet Take 5 mg by mouth every 12 (twelve) hours.   Yes Historical Provider, MD  OXYGEN Inhale into the lungs. 4 liters/min   Yes Historical Provider, MD  pantoprazole (PROTONIX) 40 MG tablet Take 40 mg by mouth daily.   Yes Historical Provider, MD  PROAIR HFA 108 [phone] Base) MCG/ACT inhaler INHALE TWO PUFFS EVERY FOUR HOURS AS NEEDED 09/14/16  Yes Collene Gobble, MD  simvastatin (ZOCOR) 40 MG tablet Take 40 mg by mouth every evening.    Yes Historical Provider, MD  traZODone (DESYREL) 150 MG tablet Take 150 mg by mouth at bedtime as needed for sleep.    Yes Historical Provider, MD  umeclidinium-vilanterol (ANORO ELLIPTA) 62.5-25 MCG/INH AEPB Inhale 1 puff into the lungs daily.   Yes Historical Provider, MD  albuterol (PROVENTIL) (2.5 MG/3ML) 0.083% nebulizer solution Take 3 mLs (2.5 mg total) by nebulization every 6 (six) hours as needed for wheezing or shortness of breath. 10/04/16   Nat Christen, MD  polyethylene glycol-electrolytes (TRILYTE) 420 g solution Take 4,000 mLs by mouth as directed. Patient not taking: Reported on 10/04/2016 09/18/16   Daneil Dolin, MD  predniSONE (DELTASONE) 10 MG tablet Take 2 tablets (20 mg total) by mouth daily. 10/04/16   Nat Christen, MD    Family History Family History  Problem Relation Age of Onset  . Diabetes Mother   . Colon cancer Neg Hx   . Liver disease Neg Hx     Social History Social History  Substance Use Topics  . Smoking status: Current Every Day Smoker    Packs/day: 0.25    Years: 37.00    Types: Cigarettes  . Smokeless tobacco: Never Used  . Alcohol use Yes     Comment: occasionally, has had two etoh beverages in the last 3 months. never drank daily     Allergies     Advil [ibuprofen] and Aleve [naproxen sodium]   Review of Systems Review of Systems  All other systems reviewed and are negative.    Physical Exam Updated Vital Signs BP 117/88   Pulse 84   Temp 97.6 F (36.4 C) (Oral)   Resp (!) 23   Ht 6\' 3"  (1.905 m)   Wt 196 lb (88.9 kg)   SpO2 94%   BMI 24.50 kg/m   Physical Exam  Constitutional: He is oriented to person, place, and time.  Dyspneic and tachypnea  HENT:  Head: Normocephalic and atraumatic.  Eyes: Conjunctivae are normal.  Neck: Neck supple.  Cardiovascular: Normal rate and regular rhythm.   Pulmonary/Chest:  Bilateral expiratory wheezes  Abdominal: Soft. Bowel sounds are normal.  Musculoskeletal:  Normal range of motion.  Neurological: He is alert and oriented to person, place, and time.  Skin: Skin is warm and dry.  Psychiatric: He has a normal mood and affect. His behavior is normal.  Nursing note and vitals reviewed.    ED Treatments / Results  Labs (all labs ordered are listed, but only abnormal results are displayed) Labs Reviewed  BASIC METABOLIC PANEL - Abnormal; Notable for the following:       Result Value   Chloride 97 (*)    Glucose, Bld 103 (*)    All other components within normal limits  CBC WITH DIFFERENTIAL/PLATELET - Abnormal; Notable for the following:    WBC 14.5 (*)    Lymphs Abs 4.9 (*)    Eosinophils Absolute 1.0 (*)    All other components within normal limits    EKG  EKG Interpretation  Date/Time:  Wednesday October 04 2016 17:41:44 EDT Ventricular Rate:  108 PR Interval:  150 QRS Duration: 88 QT Interval:  334 QTC Calculation: 447 R Axis:   21 Text Interpretation:  Sinus tachycardia with occasional Premature ventricular complexes Right atrial enlargement Borderline ECG Confirmed by Lacinda Axon  MD, Avabella Wailes ([id number]) on 10/04/2016 9:40:17 PM       Radiology Dg Chest 2 View  Result Date: 10/04/2016 CLINICAL DATA:  Shortness of breath, wheezing EXAM: CHEST  2 VIEW COMPARISON:   02/28/2016 FINDINGS: Cardiomediastinal silhouette is stable. Again noted multiple old fracture deformities left ribs. No infiltrate or pleural effusion. No pulmonary edema. Mild degenerative changes mid thoracic spine. IMPRESSION: No active cardiopulmonary disease. Stable old fracture deformities left ribs. Electronically Signed   By: Lahoma Crocker M.D.   On: 10/04/2016 18:31    Procedures Procedures (including critical care time)  Medications Ordered in ED Medications  albuterol (PROVENTIL) (2.5 MG/3ML) 0.083% nebulizer solution 5 mg (5 mg Nebulization Not Given 10/04/16 1938)  sodium chloride 0.9 % bolus 500 mL (0 mLs Intravenous Stopped 10/04/16 2019)  ipratropium-albuterol (DUONEB) 0.5-2.5 (3) MG/3ML nebulizer solution 3 mL (3 mLs Nebulization Given 10/04/16 1858)  albuterol (PROVENTIL) (2.5 MG/3ML) 0.083% nebulizer solution 2.5 mg (2.5 mg Nebulization Given 10/04/16 1859)  methylPREDNISolone sodium succinate (SOLU-MEDROL) 125 mg/2 mL injection 125 mg (125 mg Intravenous Given 10/04/16 1924)  acetaminophen (TYLENOL) tablet 1,000 mg (1,000 mg Oral Given 10/04/16 2022)     Initial Impression / Assessment and Plan / ED Course  I have reviewed the triage vital signs and the nursing notes.  Pertinent labs & imaging results that were available during my care of the patient were reviewed by me and considered in my medical decision making (see chart for details).     Patient presents with a COPD exacerbation. Chest x-ray negative for pneumonia. He feels much better after IV steroids and a Albuterol/Atrovent treatment. Will discharge home with prednisone and albuterol solution for a nebulizer machine  Final Clinical Impressions(s) / ED Diagnoses   Final diagnoses:  None    New Prescriptions New Prescriptions   ALBUTEROL (PROVENTIL) (2.5 MG/3ML) 0.083% NEBULIZER SOLUTION    Take 3 mLs (2.5 mg total) by nebulization every 6 (six) hours as needed for wheezing or shortness of breath.   PREDNISONE  (DELTASONE) 10 MG TABLET    Take 2 tablets (20 mg total) by mouth daily.     Nat Christen, MD 10/04/16 [phone]

## 2016-10-05 NOTE — ED Notes (Signed)
Dr. Lacinda Axon notified of pts O2 saturation at time of discharge. Pt ambulatory to waiting room. Pt verbalized understanding of discharge instructions.

## 2016-10-06 ENCOUNTER — Telehealth: Payer: Self-pay | Admitting: Emergency Medicine

## 2016-10-06 DIAGNOSIS — J438 Other emphysema: Secondary | ICD-10-CM

## 2016-10-06 NOTE — Telephone Encounter (Signed)
Ok to order 

## 2016-10-06 NOTE — Telephone Encounter (Signed)
Called and spoke with pts sister and she stated that the pt was given albuterol for the nebulizer but he does not have the nebulizer machine at home.  They are needing an order sent to [employer] for the [employer].  RB please advise if you are ok with this order being sent in.  thanks

## 2016-10-06 NOTE — Telephone Encounter (Signed)
dme order for nebulizer and supplies placed. Pt's sister (dpr on file) aware.  Nothing further needed.

## 2016-10-10 ENCOUNTER — Telehealth: Payer: Self-pay | Admitting: Emergency Medicine

## 2016-10-10 NOTE — Telephone Encounter (Signed)
Per Bethanne Ginger from [facility] ok to adddend 08/07/16 note to add the nebulizer info.

## 2016-10-10 NOTE — Telephone Encounter (Signed)
RB can you please do an addendum to the 1/22 OV note to make a statement about the pt needing the nebulizer.  Thanks and please advise once completed so this may be sent to [facility].

## 2016-10-10 NOTE — Telephone Encounter (Signed)
Spoke with pt's sister, following up on nebulizer order- states they have not heard anything regarding delivery/setup of nebulizer supplies.   Spoke with Vallarie Mare and Judeen Hammans regarding below note to see if 1/22 phone note can be addended to mention need for nebulizer, or if pt will have to come back in for another ov.  Vallarie Mare and Judeen Hammans will look into this and let us know.  Will await their response.

## 2016-10-10 NOTE — Telephone Encounter (Signed)
Order was sent to [facility] we received this message from [place] at [place] -   We can't proceed with the order as the patient  doesn't have a Office Visit that mentions  ordering the Nebulizer. Once patient has office  visit then we can proceed.   (I will close pending order and new order can be placed after pt has ov.)

## 2016-10-11 NOTE — Patient Instructions (Signed)
Sean Thomas  10/11/2016     @PREFPERIOPPHARMACY @   Your procedure is scheduled on  10/23/2016   Report to [name] at  645  A.M.  Call this number if you have problems the morning of surgery:  [phone]   Remember:  Do not eat food or drink liquids after midnight.  Take these medicines the morning of surgery with A SIP OF WATER  flexaril, zofran, oxycodone, protonix, deltasone. Use your nebulizer and your inhalers before you come.   Do not wear jewelry, make-up or nail polish.  Do not wear lotions, powders, or perfumes, or deoderant.  Do not shave 48 hours prior to surgery.  Men may shave face and neck.  Do not bring valuables to the hospital.  [facility] is not responsible for any belongings or valuables.  Contacts, dentures or bridgework may not be worn into surgery.  Leave your suitcase in the car.  After surgery it may be brought to your room.  For patients admitted to the hospital, discharge time will be determined by your treatment team.  Patients discharged the day of surgery will not be allowed to drive home.   Name and phone number of your driver:   family Special instructions:  Follow the diet and prep instructions given to you by Dr Roseanne Kaufman office.  Please read over the following fact sheets that you were given. Anesthesia Post-op Instructions and Care and Recovery After Surgery       Colonoscopy, Adult A colonoscopy is an exam to look at the entire large intestine. During the exam, a lubricated, bendable tube is inserted into the anus and then passed into the rectum, colon, and other parts of the large intestine. A colonoscopy is often done as a part of normal colorectal screening or in response to certain symptoms, such as anemia, persistent diarrhea, abdominal pain, and blood in the stool. The exam can help screen for and diagnose medical problems, including:  Tumors.  Polyps.  Inflammation.  Areas of bleeding. Tell a health care  provider about:  Any allergies you have.  All medicines you are taking, including vitamins, herbs, eye drops, creams, and over-the-counter medicines.  Any problems you or family members have had with anesthetic medicines.  Any blood disorders you have.  Any surgeries you have had.  Any medical conditions you have.  Any problems you have had passing stool. What are the risks? Generally, this is a safe procedure. However, problems may occur, including:  Bleeding.  A tear in the intestine.  A reaction to medicines given during the exam.  Infection (rare). What happens before the procedure? Eating and drinking restrictions  Follow instructions from your health care provider about eating and drinking, which may include:  A few days before the procedure - follow a low-fiber diet. Avoid nuts, seeds, dried fruit, raw fruits, and vegetables.  1-3 days before the procedure - follow a clear liquid diet. Drink only clear liquids, such as clear broth or bouillon, black coffee or tea, clear juice, clear soft drinks or sports drinks, gelatin dessert, and popsicles. Avoid any liquids that contain red or purple dye.  On the day of the procedure - do not eat or drink anything during the 2 hours before the procedure, or within the time period that your health care provider recommends. Bowel prep  If you were prescribed an oral bowel prep to clean out your colon:  Take it as told by  your health care provider. Starting the day before your procedure, you will need to drink a large amount of medicated liquid. The liquid will cause you to have multiple loose stools until your stool is almost clear or light green.  If your skin or anus gets irritated from diarrhea, you may use these to relieve the irritation:  Medicated wipes, such as adult wet wipes with aloe and vitamin E.  A skin soothing-product like petroleum jelly.  If you vomit while drinking the bowel prep, take a break for up to 60  minutes and then begin the bowel prep again. If vomiting continues and you cannot take the bowel prep without vomiting, call your health care provider. General instructions   Ask your health care provider about changing or stopping your regular medicines. This is especially important if you are taking diabetes medicines or blood thinners.  Plan to have someone take you home from the hospital or clinic. What happens during the procedure?  An IV tube may be inserted into one of your veins.  You will be given medicine to help you relax (sedative).  To reduce your risk of infection:  Your health care team will wash or sanitize their hands.  Your anal area will be washed with soap.  You will be asked to lie on your side with your knees bent.  Your health care provider will lubricate a long, thin, flexible tube. The tube will have a camera and a light on the end.  The tube will be inserted into your anus.  The tube will be gently eased through your rectum and colon.  Air will be delivered into your colon to keep it open. You may feel some pressure or cramping.  The camera will be used to take images during the procedure.  A small tissue sample may be removed from your body to be examined under a microscope (biopsy). If any potential problems are found, the tissue will be sent to a lab for testing.  If small polyps are found, your health care provider may remove them and have them checked for cancer cells.  The tube that was inserted into your anus will be slowly removed. The procedure may vary among health care providers and hospitals. What happens after the procedure?  Your blood pressure, heart rate, breathing rate, and blood oxygen level will be monitored until the medicines you were given have worn off.  Do not drive for 24 hours after the exam.  You may have a small amount of blood in your stool.  You may pass gas and have mild abdominal cramping or bloating due to the air  that was used to inflate your colon during the exam.  It is up to you to get the results of your procedure. Ask your health care provider, or the department performing the procedure, when your results will be ready. This information is not intended to replace advice given to you by your health care provider. Make sure you discuss any questions you have with your health care provider. Document Released: 06/30/2000 Document Revised: 05/03/2016 Document Reviewed: 09/14/2015 Elsevier Interactive Patient Education  2017 [employer]  Colonoscopy, Adult, Care After This sheet gives you information about how to care for yourself after your procedure. Your health care provider may also give you more specific instructions. If you have problems or questions, contact your health care provider. What can I expect after the procedure? After the procedure, it is common to have:  A small amount of blood  in your stool for 24 hours after the procedure.  Some gas.  Mild abdominal cramping or bloating. Follow these instructions at home: General instructions    For the first 24 hours after the procedure:  Do not drive or use machinery.  Do not sign important documents.  Do not drink alcohol.  Do your regular daily activities at a slower pace than normal.  Eat soft, easy-to-digest foods.  Rest often.  Take over-the-counter or prescription medicines only as told by your health care provider.  It is up to you to get the results of your procedure. Ask your health care provider, or the department performing the procedure, when your results will be ready. Relieving cramping and bloating   Try walking around when you have cramps or feel bloated.  Apply heat to your abdomen as told by your health care provider. Use a heat source that your health care provider recommends, such as a moist heat pack or a heating pad.  Place a towel between your skin and the heat source.  Leave the heat on for 20-30  minutes.  Remove the heat if your skin turns bright red. This is especially important if you are unable to feel pain, heat, or cold. You may have a greater risk of getting burned. Eating and drinking   Drink enough fluid to keep your urine clear or pale yellow.  Resume your normal diet as instructed by your health care provider. Avoid heavy or fried foods that are hard to digest.  Avoid drinking alcohol for as long as instructed by your health care provider. Contact a health care provider if:  You have blood in your stool 2-3 days after the procedure. Get help right away if:  You have more than a small spotting of blood in your stool.  You pass large blood clots in your stool.  Your abdomen is swollen.  You have nausea or vomiting.  You have a fever.  You have increasing abdominal pain that is not relieved with medicine. This information is not intended to replace advice given to you by your health care provider. Make sure you discuss any questions you have with your health care provider. Document Released: 02/15/2004 Document Revised: 03/27/2016 Document Reviewed: 09/14/2015 Elsevier Interactive Patient Education  2017 [place] Anesthesia is a term that refers to techniques, procedures, and medicines that help a person stay safe and comfortable during a medical procedure. Monitored anesthesia care, or sedation, is one type of anesthesia. Your anesthesia specialist may recommend sedation if you will be having a procedure that does not require you to be unconscious, such as:  Cataract surgery.  A dental procedure.  A biopsy.  A colonoscopy. During the procedure, you may receive a medicine to help you relax (sedative). There are three levels of sedation:  Mild sedation. At this level, you may feel awake and relaxed. You will be able to follow directions.  Moderate sedation. At this level, you will be sleepy. You may not remember the  procedure.  Deep sedation. At this level, you will be asleep. You will not remember the procedure. The more medicine you are given, the deeper your level of sedation will be. Depending on how you respond to the procedure, the anesthesia specialist may change your level of sedation or the type of anesthesia to fit your needs. An anesthesia specialist will monitor you closely during the procedure. Let your health care provider know about:  Any allergies you have.  All medicines  you are taking, including vitamins, herbs, eye drops, creams, and over-the-counter medicines.  Any use of steroids (by mouth or as a cream).  Any problems you or family members have had with sedatives and anesthetic medicines.  Any blood disorders you have.  Any surgeries you have had.  Any medical conditions you have, such as sleep apnea.  Whether you are pregnant or may be pregnant.  Any use of cigarettes, alcohol, or street drugs. What are the risks? Generally, this is a safe procedure. However, problems may occur, including:  Getting too much medicine (oversedation).  Nausea.  Allergic reaction to medicines.  Trouble breathing. If this happens, a breathing tube may be used to help with breathing. It will be removed when you are awake and breathing on your own.  Heart trouble.  Lung trouble. Before the procedure Staying hydrated  Follow instructions from your health care provider about hydration, which may include:  Up to 2 hours before the procedure - you may continue to drink clear liquids, such as water, clear fruit juice, black coffee, and plain tea. Eating and drinking restrictions  Follow instructions from your health care provider about eating and drinking, which may include:  8 hours before the procedure - stop eating heavy meals or foods such as meat, fried foods, or fatty foods.  6 hours before the procedure - stop eating light meals or foods, such as toast or cereal.  6 hours before  the procedure - stop drinking milk or drinks that contain milk.  2 hours before the procedure - stop drinking clear liquids. Medicines  Ask your health care provider about:  Changing or stopping your regular medicines. This is especially important if you are taking diabetes medicines or blood thinners.  Taking medicines such as aspirin and ibuprofen. These medicines can thin your blood. Do not take these medicines before your procedure if your health care provider instructs you not to. Tests and exams  You will have a physical exam.  You may have blood tests done to show:  How well your kidneys and liver are working.  How well your blood can clot.  General instructions  Plan to have someone take you home from the hospital or clinic.  If you will be going home right after the procedure, plan to have someone with you for 24 hours. What happens during the procedure?  Your blood pressure, heart rate, breathing, level of pain and overall condition will be monitored.  An IV tube will be inserted into one of your veins.  Your anesthesia specialist will give you medicines as needed to keep you comfortable during the procedure. This may mean changing the level of sedation.  The procedure will be performed. After the procedure  Your blood pressure, heart rate, breathing rate, and blood oxygen level will be monitored until the medicines you were given have worn off.  Do not drive for 24 hours if you received a sedative.  You may:  Feel sleepy, clumsy, or nauseous.  Feel forgetful about what happened after the procedure.  Have a sore throat if you had a breathing tube during the procedure.  Vomit. This information is not intended to replace advice given to you by your health care provider. Make sure you discuss any questions you have with your health care provider. Document Released: 03/29/2005 Document Revised: 12/10/2015 Document Reviewed: 10/24/2015 Elsevier Interactive  Patient Education  2017 [place], Care After These instructions provide you with information about caring for yourself after  your procedure. Your health care provider may also give you more specific instructions. Your treatment has been planned according to current medical practices, but problems sometimes occur. Call your health care provider if you have any problems or questions after your procedure. What can I expect after the procedure? After your procedure, it is common to:  Feel sleepy for several hours.  Feel clumsy and have poor balance for several hours.  Feel forgetful about what happened after the procedure.  Have poor judgment for several hours.  Feel nauseous or vomit.  Have a sore throat if you had a breathing tube during the procedure. Follow these instructions at home: For at least 24 hours after the procedure:    Do not:  Participate in activities in which you could fall or become injured.  Drive.  Use heavy machinery.  Drink alcohol.  Take sleeping pills or medicines that cause drowsiness.  Make important decisions or sign legal documents.  Take care of children on your own.  Rest. Eating and drinking   Follow the diet that is recommended by your health care provider.  If you vomit, drink water, juice, or soup when you can drink without vomiting.  Make sure you have little or no nausea before eating solid foods. General instructions   Have a responsible adult stay with you until you are awake and alert.  Take over-the-counter and prescription medicines only as told by your health care provider.  If you smoke, do not smoke without supervision.  Keep all follow-up visits as told by your health care provider. This is important. Contact a health care provider if:  You keep feeling nauseous or you keep vomiting.  You feel light-headed.  You develop a rash.  You have a fever. Get help right away if:  You have  trouble breathing. This information is not intended to replace advice given to you by your health care provider. Make sure you discuss any questions you have with your health care provider. Document Released: 10/24/2015 Document Revised: 02/23/2016 Document Reviewed: 10/24/2015 Elsevier Interactive Patient Education  2017 [employer].

## 2016-10-12 ENCOUNTER — Other Ambulatory Visit: Payer: Self-pay | Admitting: Internal Medicine

## 2016-10-12 NOTE — Telephone Encounter (Signed)
Addendum made

## 2016-10-12 NOTE — Telephone Encounter (Signed)
addended OV note printed and faxed to [place].  Nothing further needed.

## 2016-10-16 ENCOUNTER — Telehealth: Payer: Self-pay | Admitting: Emergency Medicine

## 2016-10-16 NOTE — Telephone Encounter (Signed)
lmtcb

## 2016-10-17 ENCOUNTER — Encounter (HOSPITAL_COMMUNITY): Payer: Self-pay

## 2016-10-17 ENCOUNTER — Encounter (HOSPITAL_COMMUNITY)
Admission: RE | Admit: 2016-10-17 | Discharge: 2016-10-17 | Disposition: A | Discharge status: Home / Self Care | Payer: Medicaid Other | Source: Ambulatory Visit | Attending: Internal Medicine | Admitting: Internal Medicine

## 2016-10-17 DIAGNOSIS — Z86010 Personal history of colonic polyps: Secondary | ICD-10-CM | POA: Insufficient documentation

## 2016-10-17 DIAGNOSIS — Z01812 Encounter for preprocedural laboratory examination: Secondary | ICD-10-CM | POA: Diagnosis present

## 2016-10-17 DIAGNOSIS — D509 Iron deficiency anemia, unspecified: Secondary | ICD-10-CM | POA: Diagnosis not present

## 2016-10-17 NOTE — Telephone Encounter (Signed)
Patient sister Joni Reining called pt does need 6 min walk sooner or he will lose his oxygen - she can be reached at [phone] -pr

## 2016-10-17 NOTE — Telephone Encounter (Signed)
Spoke with Zora, the pt's sister  She states Estill Bamberg at [facility] told her that pt needs qualifying walk for o2  I asked if the 4/23 appt was too far out, and Zora states it is too far out  I called [employer] at [phone] to check and see  Had to [facility]

## 2016-10-17 NOTE — Telephone Encounter (Signed)
LMTCB- he has scheduled ov 4/23- can this not wait until then?

## 2016-10-18 ENCOUNTER — Other Ambulatory Visit (HOSPITAL_COMMUNITY): Payer: Medicaid Other

## 2016-10-18 NOTE — Telephone Encounter (Signed)
Spoke with pt's sister Joni Reining, aware that we will do qualifying walk at 4/23 office visit.  Nothing further needed.

## 2016-10-18 NOTE — Telephone Encounter (Signed)
Sister Joni Reining) of patient returned phone call, contact [phone].Mearl Latin

## 2016-10-18 NOTE — Telephone Encounter (Signed)
Called and spoke with [employer] and they stated that the walk can be done on the appt date of 4/23 since the pt was supposed to be qualified before 4/1.  I have called zora and lmomtcb to schedule the walk appt for 4/23 prior to his appt.

## 2016-10-20 ENCOUNTER — Emergency Department (HOSPITAL_COMMUNITY): Payer: Medicaid Other

## 2016-10-20 ENCOUNTER — Other Ambulatory Visit: Payer: Self-pay

## 2016-10-20 ENCOUNTER — Encounter (HOSPITAL_COMMUNITY): Payer: Self-pay

## 2016-10-20 ENCOUNTER — Inpatient Hospital Stay (HOSPITAL_COMMUNITY)
Admission: EM | Admit: 2016-10-20 | Discharge: 2016-10-23 | DRG: 191 | Disposition: A | Discharge status: Home / Self Care | Payer: Medicaid Other | Attending: Internal Medicine | Admitting: Internal Medicine

## 2016-10-20 DIAGNOSIS — I10 Essential (primary) hypertension: Secondary | ICD-10-CM | POA: Diagnosis not present

## 2016-10-20 DIAGNOSIS — Z79891 Long term (current) use of opiate analgesic: Secondary | ICD-10-CM

## 2016-10-20 DIAGNOSIS — Z886 Allergy status to analgesic agent status: Secondary | ICD-10-CM

## 2016-10-20 DIAGNOSIS — F17210 Nicotine dependence, cigarettes, uncomplicated: Secondary | ICD-10-CM | POA: Diagnosis present

## 2016-10-20 DIAGNOSIS — Z7982 Long term (current) use of aspirin: Secondary | ICD-10-CM

## 2016-10-20 DIAGNOSIS — Z9981 Dependence on supplemental oxygen: Secondary | ICD-10-CM

## 2016-10-20 DIAGNOSIS — G894 Chronic pain syndrome: Secondary | ICD-10-CM | POA: Diagnosis present

## 2016-10-20 DIAGNOSIS — J441 Chronic obstructive pulmonary disease with (acute) exacerbation: Principal | ICD-10-CM | POA: Diagnosis present

## 2016-10-20 DIAGNOSIS — E876 Hypokalemia: Secondary | ICD-10-CM | POA: Diagnosis present

## 2016-10-20 DIAGNOSIS — E785 Hyperlipidemia, unspecified: Secondary | ICD-10-CM | POA: Diagnosis present

## 2016-10-20 DIAGNOSIS — K5900 Constipation, unspecified: Secondary | ICD-10-CM | POA: Diagnosis present

## 2016-10-20 DIAGNOSIS — G8929 Other chronic pain: Secondary | ICD-10-CM | POA: Diagnosis present

## 2016-10-20 DIAGNOSIS — K219 Gastro-esophageal reflux disease without esophagitis: Secondary | ICD-10-CM | POA: Diagnosis present

## 2016-10-20 DIAGNOSIS — J9611 Chronic respiratory failure with hypoxia: Secondary | ICD-10-CM | POA: Diagnosis present

## 2016-10-20 LAB — CBC WITH DIFFERENTIAL/PLATELET
BASOS PCT: 0 %
Basophils Absolute: 0.1 10*3/uL (ref 0.0–0.1)
EOS ABS: 0.8 10*3/uL — AB (ref 0.0–0.7)
EOS PCT: 4 %
HCT: 48.4 % (ref 39.0–52.0)
HEMOGLOBIN: 16.4 g/dL (ref 13.0–17.0)
Lymphocytes Relative: 20 %
Lymphs Abs: 4 10*3/uL (ref 0.7–4.0)
MCH: 29.8 pg (ref 26.0–34.0)
MCHC: 33.9 g/dL (ref 30.0–36.0)
MCV: 88 fL (ref 78.0–100.0)
MONOS PCT: 7 %
Monocytes Absolute: 1.3 10*3/uL — ABNORMAL HIGH (ref 0.1–1.0)
NEUTROS PCT: 69 %
Neutro Abs: 13.4 10*3/uL — ABNORMAL HIGH (ref 1.7–7.7)
PLATELETS: 289 10*3/uL (ref 150–400)
RBC: 5.5 MIL/uL (ref 4.22–5.81)
RDW: 15.1 % (ref 11.5–15.5)
WBC: 19.5 10*3/uL — ABNORMAL HIGH (ref 4.0–10.5)

## 2016-10-20 LAB — BASIC METABOLIC PANEL
Anion gap: 9 (ref 5–15)
BUN: 13 mg/dL (ref 6–20)
CALCIUM: 9.4 mg/dL (ref 8.9–10.3)
CO2: 30 mmol/L (ref 22–32)
CREATININE: 0.87 mg/dL (ref 0.61–1.24)
Chloride: 95 mmol/L — ABNORMAL LOW (ref 101–111)
GFR calc non Af Amer: >60 mL/min (ref >60)
Glucose, Bld: 119 mg/dL — ABNORMAL HIGH (ref 65–99)
Potassium: 3.4 mmol/L — ABNORMAL LOW (ref 3.5–5.1)
Sodium: 134 mmol/L — ABNORMAL LOW (ref 135–145)

## 2016-10-20 MED ORDER — NICOTINE 21 MG/24HR TD PT24
21.0000 mg | MEDICATED_PATCH | Freq: Every day | TRANSDERMAL | Status: DC
  Administered 2016-10-21: 21 mg via TRANSDERMAL
  Filled 2016-10-20 (×3): qty 1

## 2016-10-20 MED ORDER — OXYCODONE HCL 5 MG PO TABS
5.0000 mg | ORAL_TABLET | Freq: Once | ORAL | Status: AC
  Administered 2016-10-20: 5 mg via ORAL
  Filled 2016-10-20: qty 1

## 2016-10-20 MED ORDER — SODIUM CHLORIDE 0.9% FLUSH: 3.0000 mL | INTRAVENOUS | Status: DC | PRN

## 2016-10-20 MED ORDER — HYDROCHLOROTHIAZIDE 25 MG PO TABS
25.0000 mg | ORAL_TABLET | Freq: Every day | ORAL | Status: DC
  Administered 2016-10-21 – 2016-10-23 (×3): 25 mg via ORAL
  Filled 2016-10-20 (×3): qty 1

## 2016-10-20 MED ORDER — ONDANSETRON HCL 4 MG PO TABS: 4.0000 mg | ORAL_TABLET | Freq: Four times a day (QID) | ORAL | Status: DC | PRN

## 2016-10-20 MED ORDER — ONDANSETRON HCL 4 MG/2ML IJ SOLN
4.0000 mg | Freq: Four times a day (QID) | INTRAMUSCULAR | Status: DC | PRN
  Administered 2016-10-21: 4 mg via INTRAVENOUS
  Filled 2016-10-20: qty 2

## 2016-10-20 MED ORDER — PANTOPRAZOLE SODIUM 40 MG PO TBEC
40.0000 mg | DELAYED_RELEASE_TABLET | Freq: Two times a day (BID) | ORAL | Status: DC
  Administered 2016-10-21 – 2016-10-23 (×6): 40 mg via ORAL
  Filled 2016-10-20 (×6): qty 1

## 2016-10-20 MED ORDER — ACETAMINOPHEN 650 MG RE SUPP: 650.0000 mg | Freq: Four times a day (QID) | RECTAL | Status: DC | PRN

## 2016-10-20 MED ORDER — ALBUTEROL SULFATE (2.5 MG/3ML) 0.083% IN NEBU: 2.5000 mg | INHALATION_SOLUTION | Freq: Four times a day (QID) | RESPIRATORY_TRACT | Status: DC

## 2016-10-20 MED ORDER — POTASSIUM CHLORIDE CRYS ER 20 MEQ PO TBCR
20.0000 meq | EXTENDED_RELEASE_TABLET | Freq: Once | ORAL | Status: AC
  Administered 2016-10-21: 20 meq via ORAL
  Filled 2016-10-20: qty 1

## 2016-10-20 MED ORDER — IPRATROPIUM BROMIDE 0.02 % IN SOLN
1.0000 mg | Freq: Once | RESPIRATORY_TRACT | Status: AC
  Administered 2016-10-20: 1 mg via RESPIRATORY_TRACT
  Filled 2016-10-20: qty 5

## 2016-10-20 MED ORDER — TRAZODONE HCL 50 MG PO TABS
150.0000 mg | ORAL_TABLET | Freq: Every day | ORAL | Status: DC
  Administered 2016-10-21 – 2016-10-22 (×3): 150 mg via ORAL
  Filled 2016-10-20 (×3): qty 3

## 2016-10-20 MED ORDER — AZITHROMYCIN 250 MG PO TABS
250.0000 mg | ORAL_TABLET | Freq: Every day | ORAL | Status: DC
  Administered 2016-10-21 – 2016-10-22 (×2): 250 mg via ORAL
  Filled 2016-10-20 (×2): qty 1

## 2016-10-20 MED ORDER — ASPIRIN EC 81 MG PO TBEC
81.0000 mg | DELAYED_RELEASE_TABLET | Freq: Every day | ORAL | Status: DC
  Administered 2016-10-21 – 2016-10-23 (×3): 81 mg via ORAL
  Filled 2016-10-20 (×3): qty 1

## 2016-10-20 MED ORDER — SODIUM CHLORIDE 0.9% FLUSH
3.0000 mL | Freq: Two times a day (BID) | INTRAVENOUS | Status: DC
  Administered 2016-10-21 – 2016-10-23 (×6): 3 mL via INTRAVENOUS

## 2016-10-20 MED ORDER — CYCLOBENZAPRINE HCL 10 MG PO TABS
10.0000 mg | ORAL_TABLET | Freq: Three times a day (TID) | ORAL | Status: DC
  Administered 2016-10-21 – 2016-10-23 (×8): 10 mg via ORAL
  Filled 2016-10-20 (×8): qty 1

## 2016-10-20 MED ORDER — ALBUTEROL (5 MG/ML) CONTINUOUS INHALATION SOLN
10.0000 mg/h | INHALATION_SOLUTION | RESPIRATORY_TRACT | Status: DC
  Administered 2016-10-20: 10 mg/h via RESPIRATORY_TRACT
  Filled 2016-10-20: qty 20

## 2016-10-20 MED ORDER — METHYLPREDNISOLONE SODIUM SUCC 125 MG IJ SOLR
60.0000 mg | Freq: Four times a day (QID) | INTRAMUSCULAR | Status: DC
  Administered 2016-10-21 – 2016-10-23 (×10): 60 mg via INTRAVENOUS
  Filled 2016-10-20 (×10): qty 2

## 2016-10-20 MED ORDER — PREDNISONE 20 MG PO TABS
40.0000 mg | ORAL_TABLET | Freq: Once | ORAL | Status: AC
  Administered 2016-10-20: 40 mg via ORAL
  Filled 2016-10-20: qty 2

## 2016-10-20 MED ORDER — OXYCODONE HCL 5 MG PO TABS
5.0000 mg | ORAL_TABLET | Freq: Two times a day (BID) | ORAL | Status: DC
  Administered 2016-10-21 – 2016-10-23 (×6): 5 mg via ORAL
  Filled 2016-10-20 (×6): qty 1

## 2016-10-20 MED ORDER — IPRATROPIUM-ALBUTEROL 0.5-2.5 (3) MG/3ML IN SOLN
0.5000 mg | Freq: Four times a day (QID) | RESPIRATORY_TRACT | Status: DC
  Administered 2016-10-21: 3 mg via RESPIRATORY_TRACT
  Filled 2016-10-20 (×2): qty 3

## 2016-10-20 MED ORDER — GUAIFENESIN ER 600 MG PO TB12
600.0000 mg | ORAL_TABLET | Freq: Two times a day (BID) | ORAL | Status: DC
  Administered 2016-10-21 (×2): 600 mg via ORAL
  Filled 2016-10-20 (×2): qty 1

## 2016-10-20 MED ORDER — SODIUM CHLORIDE 0.9 % IV SOLN: 250.0000 mL | INTRAVENOUS | Status: DC | PRN

## 2016-10-20 MED ORDER — ACETAMINOPHEN 325 MG PO TABS
650.0000 mg | ORAL_TABLET | Freq: Four times a day (QID) | ORAL | Status: DC | PRN
  Filled 2016-10-20: qty 2

## 2016-10-20 MED ORDER — SODIUM CHLORIDE 0.9 % IV BOLUS (SEPSIS)
1000.0000 mL | Freq: Once | INTRAVENOUS | Status: AC
  Administered 2016-10-20: 1000 mL via INTRAVENOUS

## 2016-10-20 MED ORDER — ENOXAPARIN SODIUM 40 MG/0.4ML ~~LOC~~ SOLN
40.0000 mg | SUBCUTANEOUS | Status: DC
  Administered 2016-10-21 – 2016-10-22 (×2): 40 mg via SUBCUTANEOUS
  Filled 2016-10-20 (×3): qty 0.4

## 2016-10-20 MED ORDER — SIMVASTATIN 20 MG PO TABS
40.0000 mg | ORAL_TABLET | Freq: Every evening | ORAL | Status: DC
  Administered 2016-10-21 – 2016-10-22 (×2): 40 mg via ORAL
  Filled 2016-10-20 (×2): qty 2

## 2016-10-20 MED ORDER — ALBUTEROL SULFATE (2.5 MG/3ML) 0.083% IN NEBU
5.0000 mg | INHALATION_SOLUTION | Freq: Once | RESPIRATORY_TRACT | Status: AC
  Administered 2016-10-20: 5 mg via RESPIRATORY_TRACT
  Filled 2016-10-20: qty 6

## 2016-10-20 MED ORDER — DEXTROSE 5 % IV SOLN
500.0000 mg | Freq: Once | INTRAVENOUS | Status: AC
  Administered 2016-10-20: 500 mg via INTRAVENOUS
  Filled 2016-10-20: qty 500

## 2016-10-20 NOTE — ED Notes (Signed)
resp notified

## 2016-10-20 NOTE — H&P (Addendum)
TRH H&P    Patient Demographics:    Sean Thomas, is a 56 y.o. male  MRN: [id number]  DOB - 29-Sep-1960  Admit Date - 10/20/2016  Referring MD/NP/PA: Dr. Dolly Rias  Outpatient Primary MD for the patient is EDWARDS, Milford Cage, NP  Patient coming from: Home  Chief Complaint  Patient presents with  . Shortness of Breath      HPI:    Sean Thomas  is a 56 y.o. male, With a history of COPD on home oxygen 3 L/m, hypertension, chronic pain syndrome, hyperlipidemia, nicotine abuse who came to the hospital with worsening shortness of breath for past 2 days. He also has been coughing up phlegm. Denies chest pain, no nausea vomiting or diarrhea. Denies coughing up blood. In the ED patient was given continuous nebulizer and breathing is improved. Chest x-ray shows no pneumonia. Patient also received 1 dose of Zithromax.    Review of systems:    In addition to the HPI above,  No Fever-chills, No Headache, No changes with Vision or hearing, No problems swallowing food or Liquids, No Abdominal pain, No Nausea or Vomiting, bowel movements are regular, No Blood in stool or Urine, No dysuria, No new skin rashes or bruises, No new joints pains-aches,  No new weakness, tingling, numbness in any extremity, Weight loss -Patient says that he has lost 19 pounds since August last year No polyuria, polydypsia or polyphagia, No significant Mental Stressors.  A full 10 point Review of Systems was done, except as stated above, all other Review of Systems were negative.   With Past History of the following :    Past Medical History:  Diagnosis Date  . Cervical pain 11/21/2012  . COPD (chronic obstructive pulmonary disease) ([place])   . DDD (degenerative disc disease)   . Fracture of multiple ribs 11/01/2012  . Fracture of occipital condyle ([place]) 11/21/2012  . Headache   . Hyperlipidemia   . Hypertension   . Inguinal hernia  12/19/2013  . Microcytic anemia    Noted August 2017  . Shortness of breath dyspnea       Past Surgical History:  Procedure Laterality Date  . BACK SURGERY    . COLONOSCOPY WITH PROPOFOL N/A 06/15/2016   RMR: inadequate bowel prep all mucosal surfaces not well seen. 5 mm tubular adenoma removed from the hepatic flexure. 3 mm polyp from the rectum was hyperplastic. He had scattered diverticula  . ESOPHAGOGASTRODUODENOSCOPY (EGD) WITH PROPOFOL N/A 06/15/2016   Dr. Gala Romney: Large hiatal hernia, reflux esophagitis, Schatzki ring with focal area of ulceration, status post dilation with 31F Maloney dilator with moderate improvement in luminal narrowing.  . INGUINAL HERNIA REPAIR Left 07/24/2014   Procedure: LAPAROSCOPIC LEFT INGUINAL HERNIA REPAIR ;  Surgeon: Ralene Ok, MD;  Location: [facility] [facility];  Service: General;  Laterality: Left;  . INSERTION OF MESH Left 07/24/2014   Procedure: INSERTION OF MESH;  Surgeon: Ralene Ok, MD;  Location: [facility];  Service: General;  Laterality: Left;  . LUMBAR FUSION    . MALONEY DILATION N/A 06/15/2016  Procedure: MALONEY DILATION;  Surgeon: Daneil Dolin, MD;  [facility];  Service: Endoscopy;  Laterality: N/A;  . POLYPECTOMY  06/15/2016   Procedure: POLYPECTOMY;  Surgeon: Daneil Dolin, MD;  [facility];  Service: Endoscopy;;  colon  . right lung surgery Right       Social History:      Social History  Substance Use Topics  . Smoking status: Current Every Day Smoker    Packs/day: 0.25    Years: 37.00    Types: Cigarettes  . Smokeless tobacco: Never Used  . Alcohol use Yes     Comment: occasionally, has had two etoh beverages in the last 3 months. never drank daily       Family History :     Family History  Problem Relation Age of Onset  . Diabetes Mother   . Colon cancer Neg Hx   . Liver disease Neg Hx       Home Medications:   Prior to Admission medications   Medication Sig Start Date End Date Taking?  Authorizing Provider  albuterol (PROVENTIL) (2.5 MG/3ML) 0.083% nebulizer solution Take 3 mLs (2.5 mg total) by nebulization every 6 (six) hours as needed for wheezing or shortness of breath. 10/04/16  Yes Nat Christen, MD  aspirin EC 81 MG tablet Take 81 mg by mouth daily.   Yes Historical Provider, MD  cyclobenzaprine (FLEXERIL) 10 MG tablet Take 10 mg by mouth 3 (three) times daily.   Yes Historical Provider, MD  docusate sodium (COLACE) 100 MG capsule Take 100 mg by mouth daily.   Yes Historical Provider, MD  ferrous sulfate 325 (65 FE) MG tablet Take 325 mg by mouth daily with breakfast.   Yes Historical Provider, MD  hydrochlorothiazide (HYDRODIURIL) 25 MG tablet Take 25 mg by mouth daily.   Yes Historical Provider, MD  nicotine (NICODERM CQ - DOSED IN MG/24 HOURS) 21 mg/24hr patch Place 21 mg onto the skin daily.   Yes Historical Provider, MD  oxyCODONE (OXY IR/ROXICODONE) 5 MG immediate release tablet Take 5 mg by mouth every 12 (twelve) hours.   Yes Historical Provider, MD  OXYGEN Inhale into the lungs. 4 liters/min   Yes Historical Provider, MD  pantoprazole (PROTONIX) 40 MG tablet TAKE ONE TABLET BY MOUTH TWICE A DAY 10/12/16  Yes Carlis Stable, NP  PROAIR HFA 108 (90 Base) MCG/ACT inhaler INHALE TWO PUFFS EVERY FOUR HOURS AS NEEDED Patient taking differently: INHALE TWO PUFFS EVERY FOUR HOURS AS NEEDED FOR SHORTNESS OF BREATH/WHEEZING 09/14/16  Yes Collene Gobble, MD  simvastatin (ZOCOR) 40 MG tablet Take 40 mg by mouth every evening.    Yes Historical Provider, MD  Tiotropium Bromide-Olodaterol (STIOLTO RESPIMAT) 2.5-2.5 MCG/ACT AERS Inhale 2 puffs into the lungs daily.   Yes Historical Provider, MD  traZODone (DESYREL) 150 MG tablet Take 150 mg by mouth at bedtime.    Yes Historical Provider, MD  nicotine (NICODERM CQ - DOSED IN MG/24 HOURS) 14 mg/24hr patch Place 1 patch (14 mg total) onto the skin daily. Patient not taking: Reported on 10/20/2016 08/07/16   Collene Gobble, MD  ondansetron  (ZOFRAN) 4 MG tablet Take 4 mg by mouth every 6 (six) hours as needed for nausea or vomiting.    Historical Provider, MD  polyethylene glycol-electrolytes (TRILYTE) 420 g solution Take 4,000 mLs by mouth as directed. Patient not taking: Reported on 10/04/2016 09/18/16   Daneil Dolin, MD  predniSONE (DELTASONE) 10 MG tablet Take  2 tablets (20 mg total) by mouth daily. Patient not taking: Reported on 10/20/2016 10/04/16   Nat Christen, MD     Allergies:     Allergies  Allergen Reactions  . Advil [Ibuprofen] Diarrhea  . Aleve [Naproxen Sodium] Nausea And Vomiting    diarrhea     Physical Exam:   Vitals  Blood pressure 119/84, pulse (!) 110, temperature 97.6 F (36.4 C), resp. rate (!) 39, height 6\' 3"  (1.905 m), weight 87.5 kg (193 lb), SpO2 93 %.  1.  General: Appears in no acute distress  2. Psychiatric:  Intact judgement and  insight, awake alert, oriented x 3.  3. Neurologic: No focal neurological deficits, all cranial nerves intact.Strength 5/5 all 4 extremities, sensation intact all 4 extremities, plantars down going.  4. Eyes :  anicteric sclerae, moist conjunctivae with no lid lag. PERRLA.  5. ENMT:  Oropharynx clear with moist mucous membranes and good dentition  6. Neck:  supple, no cervical lymphadenopathy appriciated, No thyromegaly  7. Respiratory : Normal respiratory effort, bilateral wheezing auscultated  8. Cardiovascular : RRR, no gallops, rubs or murmurs, no leg edema  9. Gastrointestinal:  Positive bowel sounds, abdomen soft, non-tender to palpation,no hepatosplenomegaly, no rigidity or guarding       10. Skin:  No cyanosis, normal texture and turgor, no rash, lesions or ulcers  11.Musculoskeletal:  Good muscle tone,  joints appear normal , no effusions,  normal range of motion    Data Review:    CBC  Recent Labs Lab 10/20/16 2017  WBC 19.5*  HGB 16.4  HCT 48.4  PLT 289  MCV 88.0  MCH 29.8  MCHC 33.9  RDW 15.1  LYMPHSABS 4.0  MONOABS  1.3*  EOSABS 0.8*  BASOSABS 0.1   ------------------------------------------------------------------------------------------------------------------  Chemistries   Recent Labs Lab 10/20/16 2017  NA 134*  K 3.4*  CL 95*  CO2 30  GLUCOSE 119*  BUN 13  CREATININE 0.87  CALCIUM 9.4   ------------------------------------------------------------------------------------------------------------------  ------------------------------------------------------------------------------------------------------------------  --------------------------------------------------------------------------------------------------------------- Urine analysis:    Component Value Date/Time   COLORURINE YELLOW 02/25/2016 [place] 02/25/2016 1635   LABSPEC 1.010 02/25/2016 1635   PHURINE 6.5 02/25/2016 1635   GLUCOSEU NEGATIVE 02/25/2016 1635   HGBUR NEGATIVE 02/25/2016 1635   BILIRUBINUR NEGATIVE 02/25/2016 1635   KETONESUR TRACE (A) 02/25/2016 1635   PROTEINUR 30 (A) 02/25/2016 1635   NITRITE NEGATIVE 02/25/2016 1635   LEUKOCYTESUR NEGATIVE 02/25/2016 1635      Imaging Results:    Dg Chest 2 View  Result Date: 10/20/2016 CLINICAL DATA:  Shortness of breath and coughing 2 days. Oxygen dependent for COPD. EXAM: CHEST  2 VIEW COMPARISON:  10/04/2016 and 02/28/2016 FINDINGS: Lungs are adequately inflated without focal consolidation or effusion. Cardiomediastinal silhouette is within normal. Multiple old left rib fractures unchanged. Degenerative change of the spine. IMPRESSION: No active cardiopulmonary disease. Electronically Signed   By: Marin Olp M.D.   On: 10/20/2016 19:47    My personal review of EKG: Rhythm NSR, QTc interval 457   Assessment & Plan:    Active Problems:   Chronic pain   COPD exacerbation ([place])   Essential hypertension   1. COPD exacerbation- place under observation, start albuterol, ipratropium nebulizers every 6 hours, Solu-Medrol 60 mg IV every 6  hours. We'll start Zithromax 250 mg by mouth daily from tomorrow. Mucinex 1 tablet by mouth twice a day 2. Hypertension- blood pressure is mildly elevated, continue hydrocodone has had 25 mg by mouth  daily. 3. Chronic pain syndrome-patient has chronic pain syndrome, continue oxycodone 5 mg by mouth twice a day. Also continue Flexeril 10 mg by mouth 3 times a day. 4. Hypokalemia- potassium is 3.4, will give 1 dose of K-Dur 20 mEq by mouth. Follow BMP in a.m. 5. Leukocytosis-WBC 19,000, likely from prednisone. Patient was prescribed 20 mg prednisone daily on 10/04/2016. Follow CBC in a.m.   DVT Prophylaxis-   Lovenox   AM Labs Ordered, also please review Full Orders  Family Communication: Admission, patients condition and plan of care including tests being ordered have been discussed with the patient and his mother at bedside who indicate understanding and agree with the plan and Code Status.  Code Status:  Full code  Admission status: Observation    Time spent in minutes : 60 minutes   Treson Laura S M.D on 10/20/2016 at 11:04 PM  Between 7am to 7pm - Pager - [phone]. After 7pm go to [email or web address] - password [facility]  [facility] - Office  [phone]

## 2016-10-20 NOTE — ED Triage Notes (Signed)
Pt reports SOB for 2 days and coughing thick white mucous. On chronic o2 @ 4 L/min for COPD. Last breathing tx 1630 with no relief of SOB

## 2016-10-20 NOTE — ED Provider Notes (Signed)
[facility] DEPT Provider Note   CSN: [id number] Arrival date & time: 10/20/16  1827     History   Chief Complaint Chief Complaint  Patient presents with  . Shortness of Breath    HPI Sean Thomas is a 56 y.o. male.   Shortness of Breath  This is a recurrent problem. The average episode lasts 3 days. The problem occurs continuously.The current episode started more than 2 days ago. The problem has been gradually worsening. Associated symptoms include cough, sputum production and wheezing. Pertinent negatives include no fever. He has tried beta-agonist inhalers for the symptoms. The treatment provided mild relief. He has had prior hospitalizations. He has had prior ED visits. He has had no prior ICU admissions. Associated medical issues include COPD.    Past Medical History:  Diagnosis Date  . Cervical pain 11/21/2012  . COPD (chronic obstructive pulmonary disease) ([place])   . DDD (degenerative disc disease)   . Fracture of multiple ribs 11/01/2012  . Fracture of occipital condyle ([place]) 11/21/2012  . Headache   . Hyperlipidemia   . Hypertension   . Inguinal hernia 12/19/2013  . Microcytic anemia    Noted August 2017  . Shortness of breath dyspnea     Patient Active Problem List   Diagnosis Date Noted  . COPD exacerbation ([place]) 10/20/2016  . Essential hypertension 10/20/2016  . Esophageal reflux 09/13/2016  . Hx of adenomatous colonic polyps 09/13/2016  . Esophageal dysphagia 05/23/2016  . Abdominal pain, epigastric 05/23/2016  . IDA (iron deficiency anemia) 05/23/2016  . N&V (nausea and vomiting) 05/23/2016  . Acute on chronic respiratory failure with hypoxia ([place]) 02/25/2016  . Elevated blood sugar 02/25/2016  . Chronic pain 02/25/2016  . Malnutrition of moderate degree ([place]) 12/21/2014  . Pneumothorax on right 12/20/2014  . Rib fractures 12/20/2014  . Pneumothorax, right 12/20/2014  . COPD (chronic obstructive pulmonary disease) ([place]) 12/19/2013  . Chest pain  12/19/2013  . Inguinal hernia 12/19/2013  . Fracture of occipital condyle ([place]) 11/21/2012  . Cervical pain 11/21/2012  . Fracture of multiple ribs 11/01/2012    Past Surgical History:  Procedure Laterality Date  . BACK SURGERY    . COLONOSCOPY WITH PROPOFOL N/A 06/15/2016   RMR: inadequate bowel prep all mucosal surfaces not well seen. 5 mm tubular adenoma removed from the hepatic flexure. 3 mm polyp from the rectum was hyperplastic. He had scattered diverticula  . ESOPHAGOGASTRODUODENOSCOPY (EGD) WITH PROPOFOL N/A 06/15/2016   Dr. Gala Romney: Large hiatal hernia, reflux esophagitis, Schatzki ring with focal area of ulceration, status post dilation with 23F Maloney dilator with moderate improvement in luminal narrowing.  . INGUINAL HERNIA REPAIR Left 07/24/2014   Procedure: LAPAROSCOPIC LEFT INGUINAL HERNIA REPAIR ;  Surgeon: Ralene Ok, MD;  Location: [place];  Service: General;  Laterality: Left;  . INSERTION OF MESH Left 07/24/2014   Procedure: INSERTION OF MESH;  Surgeon: Ralene Ok, MD;  Location: [facility];  Service: General;  Laterality: Left;  . LUMBAR FUSION    . MALONEY DILATION N/A 06/15/2016   Procedure: Venia Minks DILATION;  Surgeon: Daneil Dolin, MD;  [facility];  Service: Endoscopy;  Laterality: N/A;  . POLYPECTOMY  06/15/2016   Procedure: POLYPECTOMY;  Surgeon: Daneil Dolin, MD;  [facility];  Service: Endoscopy;;  colon  . right lung surgery Right        Home Medications    Prior to Admission medications   Medication Sig Start Date End Date Taking? Authorizing  Provider  albuterol (PROVENTIL) (2.5 MG/3ML) 0.083% nebulizer solution Take 3 mLs (2.5 mg total) by nebulization every 6 (six) hours as needed for wheezing or shortness of breath. 10/04/16  Yes Nat Christen, MD  aspirin EC 81 MG tablet Take 81 mg by mouth daily.   Yes Historical Provider, MD  cyclobenzaprine (FLEXERIL) 10 MG tablet Take 10 mg by mouth 3 (three) times daily.   Yes Historical  Provider, MD  docusate sodium (COLACE) 100 MG capsule Take 100 mg by mouth daily.   Yes Historical Provider, MD  ferrous sulfate 325 (65 FE) MG tablet Take 325 mg by mouth daily with breakfast.   Yes Historical Provider, MD  hydrochlorothiazide (HYDRODIURIL) 25 MG tablet Take 25 mg by mouth daily.   Yes Historical Provider, MD  nicotine (NICODERM CQ - DOSED IN MG/24 HOURS) 21 mg/24hr patch Place 21 mg onto the skin daily.   Yes Historical Provider, MD  oxyCODONE (OXY IR/ROXICODONE) 5 MG immediate release tablet Take 5 mg by mouth every 12 (twelve) hours.   Yes Historical Provider, MD  OXYGEN Inhale into the lungs. 4 liters/min   Yes Historical Provider, MD  pantoprazole (PROTONIX) 40 MG tablet TAKE ONE TABLET BY MOUTH TWICE A DAY 10/12/16  Yes Carlis Stable, NP  PROAIR HFA 108 (90 Base) MCG/ACT inhaler INHALE TWO PUFFS EVERY FOUR HOURS AS NEEDED Patient taking differently: INHALE TWO PUFFS EVERY FOUR HOURS AS NEEDED FOR SHORTNESS OF BREATH/WHEEZING 09/14/16  Yes Collene Gobble, MD  simvastatin (ZOCOR) 40 MG tablet Take 40 mg by mouth every evening.    Yes Historical Provider, MD  Tiotropium Bromide-Olodaterol (STIOLTO RESPIMAT) 2.5-2.5 MCG/ACT AERS Inhale 2 puffs into the lungs daily.   Yes Historical Provider, MD  traZODone (DESYREL) 150 MG tablet Take 150 mg by mouth at bedtime.    Yes Historical Provider, MD  nicotine (NICODERM CQ - DOSED IN MG/24 HOURS) 14 mg/24hr patch Place 1 patch (14 mg total) onto the skin daily. Patient not taking: Reported on 10/20/2016 08/07/16   Collene Gobble, MD  ondansetron (ZOFRAN) 4 MG tablet Take 4 mg by mouth every 6 (six) hours as needed for nausea or vomiting.    Historical Provider, MD  polyethylene glycol-electrolytes (TRILYTE) 420 g solution Take 4,000 mLs by mouth as directed. Patient not taking: Reported on 10/04/2016 09/18/16   Daneil Dolin, MD  predniSONE (DELTASONE) 10 MG tablet Take 2 tablets (20 mg total) by mouth daily. Patient not taking: Reported on  10/20/2016 10/04/16   Nat Christen, MD    Family History Family History  Problem Relation Age of Onset  . Diabetes Mother   . Colon cancer Neg Hx   . Liver disease Neg Hx     Social History Social History  Substance Use Topics  . Smoking status: Current Every Day Smoker    Packs/day: 0.25    Years: 37.00    Types: Cigarettes  . Smokeless tobacco: Never Used  . Alcohol use Yes     Comment: occasionally, has had two etoh beverages in the last 3 months. never drank daily     Allergies   Advil [ibuprofen] and Aleve [naproxen sodium]   Review of Systems Review of Systems  Constitutional: Negative for fever.  Respiratory: Positive for cough, sputum production, shortness of breath and wheezing.   All other systems reviewed and are negative.    Physical Exam Updated Vital Signs BP 113/82   Pulse (!) 107   Temp 97.6 F (36.4 C)  Resp (!) 21   Ht 6\' 3"  (1.905 m)   Wt 192 lb 12.8 oz (87.5 kg)   SpO2 90%   BMI 24.10 kg/m   Physical Exam  Constitutional: He is oriented to person, place, and time. He appears well-developed and well-nourished.  HENT:  Head: Normocephalic and atraumatic.  Eyes: Conjunctivae and EOM are normal.  Neck: Normal range of motion.  Cardiovascular: Normal rate.   Pulmonary/Chest: He is in respiratory distress. He has wheezes.  Abdominal: He exhibits no distension.  Musculoskeletal: Normal range of motion. He exhibits no edema, tenderness or deformity.  Neurological: He is alert and oriented to person, place, and time.  Nursing note and vitals reviewed.    ED Treatments / Results  Labs (all labs ordered are listed, but only abnormal results are displayed) Labs Reviewed  CBC WITH DIFFERENTIAL/PLATELET - Abnormal; Notable for the following:       Result Value   WBC 19.5 (*)    Neutro Abs 13.4 (*)    Monocytes Absolute 1.3 (*)    Eosinophils Absolute 0.8 (*)    All other components within normal limits  BASIC METABOLIC PANEL - Abnormal;  Notable for the following:    Sodium 134 (*)    Potassium 3.4 (*)    Chloride 95 (*)    Glucose, Bld 119 (*)    All other components within normal limits  HIV ANTIBODY (ROUTINE TESTING)  CBC  COMPREHENSIVE METABOLIC PANEL    EKG  EKG Interpretation None       Radiology Dg Chest 2 View  Result Date: 10/20/2016 CLINICAL DATA:  Shortness of breath and coughing 2 days. Oxygen dependent for COPD. EXAM: CHEST  2 VIEW COMPARISON:  10/04/2016 and 02/28/2016 FINDINGS: Lungs are adequately inflated without focal consolidation or effusion. Cardiomediastinal silhouette is within normal. Multiple old left rib fractures unchanged. Degenerative change of the spine. IMPRESSION: No active cardiopulmonary disease. Electronically Signed   By: Marin Olp M.D.   On: 10/20/2016 19:47    Procedures Procedures (including critical care time)  Medications Ordered in ED Medications  albuterol (PROVENTIL,VENTOLIN) solution continuous neb (10 mg/hr Nebulization New Bag/Given 10/20/16 2026)  nicotine (NICODERM CQ - dosed in mg/24 hours) patch 21 mg (not administered)  pantoprazole (PROTONIX) EC tablet 40 mg (not administered)  traZODone (DESYREL) tablet 150 mg (not administered)  simvastatin (ZOCOR) tablet 40 mg (not administered)  oxyCODONE (Oxy IR/ROXICODONE) immediate release tablet 5 mg (not administered)  cyclobenzaprine (FLEXERIL) tablet 10 mg (not administered)  hydrochlorothiazide (HYDRODIURIL) tablet 25 mg (not administered)  aspirin EC tablet 81 mg (not administered)  enoxaparin (LOVENOX) injection 40 mg (not administered)  sodium chloride flush (NS) 0.9 % injection 3 mL (not administered)  sodium chloride flush (NS) 0.9 % injection 3 mL (not administered)  0.9 %  sodium chloride infusion (not administered)  ondansetron (ZOFRAN) tablet 4 mg (not administered)    Or  ondansetron (ZOFRAN) injection 4 mg (not administered)  acetaminophen (TYLENOL) tablet 650 mg (not administered)    Or    acetaminophen (TYLENOL) suppository 650 mg (not administered)  ipratropium-albuterol (DUONEB) 0.5-2.5 (3) MG/3ML nebulizer solution 0.5 mg (not administered)  methylPREDNISolone sodium succinate (SOLU-MEDROL) 125 mg/2 mL injection 60 mg (not administered)  azithromycin (ZITHROMAX) tablet 250 mg (not administered)  potassium chloride SA (K-DUR,KLOR-CON) CR tablet 20 mEq (not administered)  guaiFENesin (MUCINEX) 12 hr tablet 600 mg (not administered)  albuterol (PROVENTIL) (2.5 MG/3ML) 0.083% nebulizer solution 5 mg (5 mg Nebulization Given 10/20/16 1851)  ipratropium (ATROVENT)  nebulizer solution 1 mg (1 mg Nebulization Given 10/20/16 2026)  predniSONE (DELTASONE) tablet 40 mg (40 mg Oral Given 10/20/16 2012)  azithromycin (ZITHROMAX) 500 mg in dextrose 5 % 250 mL IVPB (0 mg Intravenous Stopped 10/20/16 2140)  sodium chloride 0.9 % bolus 1,000 mL (0 mLs Intravenous Stopped 10/20/16 2140)  oxyCODONE (Oxy IR/ROXICODONE) immediate release tablet 5 mg (5 mg Oral Given 10/20/16 2054)     Initial Impression / Assessment and Plan / ED Course  I have reviewed the triage vital signs and the nursing notes.  Pertinent labs & imaging results that were available during my care of the patient were reviewed by me and considered in my medical decision making (see chart for details).     Here with copd exacerbation. Still tight wheezing, tachypnea, relative hypoxia after breathing treatments and steroids. Will admit.  abx 2/2 increased sputum and lwukocytosis.   Final Clinical Impressions(s) / ED Diagnoses   Final diagnoses:  COPD exacerbation ([place])     Merrily Pew, MD 10/21/16 [phone]

## 2016-10-21 DIAGNOSIS — K219 Gastro-esophageal reflux disease without esophagitis: Secondary | ICD-10-CM | POA: Diagnosis present

## 2016-10-21 DIAGNOSIS — Z79891 Long term (current) use of opiate analgesic: Secondary | ICD-10-CM | POA: Diagnosis not present

## 2016-10-21 DIAGNOSIS — K5900 Constipation, unspecified: Secondary | ICD-10-CM | POA: Diagnosis present

## 2016-10-21 DIAGNOSIS — I10 Essential (primary) hypertension: Secondary | ICD-10-CM | POA: Diagnosis present

## 2016-10-21 DIAGNOSIS — J441 Chronic obstructive pulmonary disease with (acute) exacerbation: Secondary | ICD-10-CM | POA: Diagnosis not present

## 2016-10-21 DIAGNOSIS — J9611 Chronic respiratory failure with hypoxia: Secondary | ICD-10-CM | POA: Diagnosis present

## 2016-10-21 DIAGNOSIS — F17210 Nicotine dependence, cigarettes, uncomplicated: Secondary | ICD-10-CM | POA: Diagnosis present

## 2016-10-21 DIAGNOSIS — K210 Gastro-esophageal reflux disease with esophagitis: Secondary | ICD-10-CM

## 2016-10-21 DIAGNOSIS — E876 Hypokalemia: Secondary | ICD-10-CM | POA: Diagnosis present

## 2016-10-21 DIAGNOSIS — G894 Chronic pain syndrome: Secondary | ICD-10-CM | POA: Diagnosis present

## 2016-10-21 DIAGNOSIS — Z9981 Dependence on supplemental oxygen: Secondary | ICD-10-CM | POA: Diagnosis not present

## 2016-10-21 DIAGNOSIS — R0602 Shortness of breath: Secondary | ICD-10-CM | POA: Diagnosis not present

## 2016-10-21 DIAGNOSIS — Z886 Allergy status to analgesic agent status: Secondary | ICD-10-CM | POA: Diagnosis not present

## 2016-10-21 DIAGNOSIS — E785 Hyperlipidemia, unspecified: Secondary | ICD-10-CM | POA: Diagnosis present

## 2016-10-21 DIAGNOSIS — Z7982 Long term (current) use of aspirin: Secondary | ICD-10-CM | POA: Diagnosis not present

## 2016-10-21 LAB — COMPREHENSIVE METABOLIC PANEL
ALT: 12 U/L — ABNORMAL LOW (ref 17–63)
AST: 15 U/L (ref 15–41)
Albumin: 3.7 g/dL (ref 3.5–5.0)
Alkaline Phosphatase: 49 U/L (ref 38–126)
Anion gap: 7 (ref 5–15)
BUN: 13 mg/dL (ref 6–20)
CO2: 31 mmol/L (ref 22–32)
Calcium: 8.6 mg/dL — ABNORMAL LOW (ref 8.9–10.3)
Chloride: 99 mmol/L — ABNORMAL LOW (ref 101–111)
Creatinine, Ser: 0.9 mg/dL (ref 0.61–1.24)
GFR calc Af Amer: >60 mL/min (ref >60)
GFR calc non Af Amer: >60 mL/min (ref >60)
Glucose, Bld: 148 mg/dL — ABNORMAL HIGH (ref 65–99)
Potassium: 4.3 mmol/L (ref 3.5–5.1)
Sodium: 137 mmol/L (ref 135–145)
Total Bilirubin: 0.7 mg/dL (ref 0.3–1.2)
Total Protein: 6.7 g/dL (ref 6.5–8.1)

## 2016-10-21 LAB — CBC
HEMATOCRIT: 44.8 % (ref 39.0–52.0)
HEMOGLOBIN: 14.7 g/dL (ref 13.0–17.0)
MCH: 29.1 pg (ref 26.0–34.0)
MCHC: 32.8 g/dL (ref 30.0–36.0)
MCV: 88.7 fL (ref 78.0–100.0)
Platelets: 286 10*3/uL (ref 150–400)
RBC: 5.05 MIL/uL (ref 4.22–5.81)
RDW: 15.5 % (ref 11.5–15.5)
WBC: 14.8 10*3/uL — ABNORMAL HIGH (ref 4.0–10.5)

## 2016-10-21 MED ORDER — LINACLOTIDE 145 MCG PO CAPS
145.0000 ug | ORAL_CAPSULE | Freq: Every day | ORAL | Status: DC
  Administered 2016-10-22 – 2016-10-23 (×2): 145 ug via ORAL
  Filled 2016-10-21 (×2): qty 1

## 2016-10-21 MED ORDER — GUAIFENESIN ER 600 MG PO TB12
1200.0000 mg | ORAL_TABLET | Freq: Two times a day (BID) | ORAL | Status: DC
Start: 2016-10-21 — End: 2016-10-23
  Administered 2016-10-21 – 2016-10-23 (×4): 1200 mg via ORAL
  Filled 2016-10-21 (×4): qty 2

## 2016-10-21 MED ORDER — MAGNESIUM CITRATE PO SOLN
1.0000 | Freq: Once | ORAL | Status: AC
  Administered 2016-10-21: 1 via ORAL
  Filled 2016-10-21: qty 296

## 2016-10-21 MED ORDER — IPRATROPIUM-ALBUTEROL 0.5-2.5 (3) MG/3ML IN SOLN
3.0000 mL | Freq: Four times a day (QID) | RESPIRATORY_TRACT | Status: DC
  Administered 2016-10-21 – 2016-10-23 (×8): 3 mL via RESPIRATORY_TRACT
  Filled 2016-10-21 (×7): qty 3

## 2016-10-21 MED ORDER — ALBUTEROL SULFATE (2.5 MG/3ML) 0.083% IN NEBU
2.5000 mg | INHALATION_SOLUTION | RESPIRATORY_TRACT | Status: DC | PRN
  Administered 2016-10-22: 2.5 mg via RESPIRATORY_TRACT
  Filled 2016-10-21: qty 3

## 2016-10-21 MED ORDER — KETOROLAC TROMETHAMINE 30 MG/ML IJ SOLN
30.0000 mg | Freq: Four times a day (QID) | INTRAMUSCULAR | Status: DC | PRN
  Administered 2016-10-21 (×2): 30 mg via INTRAVENOUS
  Filled 2016-10-21 (×2): qty 1

## 2016-10-21 NOTE — Progress Notes (Signed)
PROGRESS NOTE    Sean Thomas  [id number] DOB: [birth year] DOA: 10/20/2016 PCP: Kerin Perna, NP    Brief Narrative:  56 year old male with a history of COPD on home oxygen, presents to the hospital with progressive shortness of breath. Found to have COPD exacerbation. Admitted for further treatments with steroids and bronchodilators.   Assessment & Plan:   Active Problems:   Chronic pain   Esophageal reflux   COPD exacerbation (HCC)   Essential hypertension   Chronic respiratory failure with hypoxia ([place])   1. COPD exacerbation. Patient reports that he still short of breath on exertion. Continue IV steroids, bronchodilators. He is also on oral azithromycin. Continue pulmonary hygiene.  2. Chronic respiratory failure. Patient is currently on 4 L of oxygen. Appears to be near baseline.  3. Hypertension. Stable. Continue current regimen.  4. GERD. Continue on PPI.  5. Chronic pain syndrome. Continue on home dose of oxycodone.   DVT prophylaxis: Lovenox Code Status: Full code Family Communication: Discussed with mother at the bedside Disposition Plan: Discharge home once improved.   Consultants:     Procedures:     Antimicrobials:   Azithromycin 4/6>>   Subjective: Still feels short of breath on exertion. Has productive cough  Objective: Vitals:   10/21/16 0525 10/21/16 0746 10/21/16 1300 10/21/16 1318  BP: 125/82  126/81   Pulse: 95  (!) 105   Resp: 18  20   Temp: 98 F (36.7 C)  98 F (36.7 C)   TempSrc: Oral  Oral   SpO2: 94% 92% 92% 92%  Weight:      Height:        Intake/Output Summary (Last 24 hours) at 10/21/16 1825 Last data filed at 10/21/16 1700  Gross per 24 hour  Intake              960 ml  Output              800 ml  Net              160 ml   Filed Weights   10/20/16 1934 10/20/16 2354 10/20/16 2358  Weight: 87.5 kg (193 lb) 87.5 kg (192 lb 14.4 oz) 87.5 kg (192 lb 12.8 oz)    Examination:  General exam: Appears  calm and comfortable  Respiratory system: mild wheeze bilaterally. Respiratory effort normal. Cardiovascular system: S1 & S2 heard, RRR. No JVD, murmurs, rubs, gallops or clicks. No pedal edema. Gastrointestinal system: Abdomen is nondistended, soft and nontender. No organomegaly or masses felt. Normal bowel sounds heard. Central nervous system: Alert and oriented. No focal neurological deficits. Extremities: Symmetric 5 x 5 power. Skin: No rashes, lesions or ulcers Psychiatry: Judgement and insight appear normal. Mood & affect appropriate.     Data Reviewed: I have personally reviewed following labs and imaging studies  CBC:  Recent Labs Lab 10/20/16 2017 10/21/16 0626  WBC 19.5* 14.8*  NEUTROABS 13.4*  --   HGB 16.4 14.7  HCT 48.4 44.8  MCV 88.0 88.7  PLT 289 481   Basic Metabolic Panel:  Recent Labs Lab 10/20/16 2017 10/21/16 0626  NA 134* 137  K 3.4* 4.3  CL 95* 99*  CO2 30 31  GLUCOSE 119* 148*  BUN 13 13  CREATININE 0.87 0.90  CALCIUM 9.4 8.6*   GFR: Estimated Creatinine Clearance: 106.9 mL/min (by C-G formula based on SCr of 0.9 mg/dL). Liver Function Tests:  Recent Labs Lab 10/21/16 0626  AST 15  ALT 12*  ALKPHOS 49  BILITOT 0.7  PROT 6.7  ALBUMIN 3.7   No results for input(s): LIPASE, AMYLASE in the last 168 hours. No results for input(s): AMMONIA in the last 168 hours. Coagulation Profile: No results for input(s): INR, PROTIME in the last 168 hours. Cardiac Enzymes: No results for input(s): CKTOTAL, CKMB, CKMBINDEX, TROPONINI in the last 168 hours. BNP (last 3 results) No results for input(s): PROBNP in the last 8760 hours. HbA1C: No results for input(s): HGBA1C in the last 72 hours. CBG: No results for input(s): GLUCAP in the last 168 hours. Lipid Profile: No results for input(s): CHOL, HDL, LDLCALC, TRIG, CHOLHDL, LDLDIRECT in the last 72 hours. Thyroid Function Tests: No results for input(s): TSH, T4TOTAL, FREET4, T3FREE, THYROIDAB in  the last 72 hours. Anemia Panel: No results for input(s): VITAMINB12, FOLATE, FERRITIN, TIBC, IRON, RETICCTPCT in the last 72 hours. Sepsis Labs: No results for input(s): PROCALCITON, LATICACIDVEN in the last 168 hours.  No results found for this or any previous visit (from the past 240 hour(s)).       Radiology Studies: Dg Chest 2 View  Result Date: 10/20/2016 CLINICAL DATA:  Shortness of breath and coughing 2 days. Oxygen dependent for COPD. EXAM: CHEST  2 VIEW COMPARISON:  10/04/2016 and 02/28/2016 FINDINGS: Lungs are adequately inflated without focal consolidation or effusion. Cardiomediastinal silhouette is within normal. Multiple old left rib fractures unchanged. Degenerative change of the spine. IMPRESSION: No active cardiopulmonary disease. Electronically Signed   By: Marin Olp M.D.   On: 10/20/2016 19:47        Scheduled Meds: . aspirin EC  81 mg Oral Daily  . azithromycin  250 mg Oral Q2200  . cyclobenzaprine  10 mg Oral TID  . enoxaparin (LOVENOX) injection  40 mg Subcutaneous Q24H  . guaiFENesin  1,200 mg Oral BID  . hydrochlorothiazide  25 mg Oral Daily  . ipratropium-albuterol  3 mL Nebulization Q6H  . [START ON 10/22/2016] linaclotide  145 mcg Oral QAC breakfast  . methylPREDNISolone (SOLU-MEDROL) injection  60 mg Intravenous Q6H  . nicotine  21 mg Transdermal Daily  . oxyCODONE  5 mg Oral Q12H  . pantoprazole  40 mg Oral BID  . simvastatin  40 mg Oral QPM  . sodium chloride flush  3 mL Intravenous Q12H  . traZODone  150 mg Oral QHS   Continuous Infusions: . albuterol 10 mg/hr (10/20/16 2026)     LOS: 0 days    Time spent: 78mins    Tymir Terral, MD [facility] Pager [phone]  If 7PM-7AM, please contact night-coverage [email or web address] Password [facility] 10/21/2016, 6:25 PM

## 2016-10-22 DIAGNOSIS — G894 Chronic pain syndrome: Secondary | ICD-10-CM

## 2016-10-22 LAB — HIV ANTIBODY (ROUTINE TESTING W REFLEX): HIV Screen 4th Generation wRfx: NONREACTIVE

## 2016-10-22 MED ORDER — MILK AND MOLASSES ENEMA
1.0000 | Freq: Once | RECTAL | Status: AC
  Administered 2016-10-22: 250 mL via RECTAL

## 2016-10-22 NOTE — Progress Notes (Signed)
PROGRESS NOTE    Sean Thomas  [id number] DOB: April 07, 1961 DOA: 10/20/2016 PCP: Kerin Perna, NP    Brief Narrative:  56 year old male with a history of COPD on home oxygen, presents to the hospital with progressive shortness of breath. Found to have COPD exacerbation. Admitted for further treatments with steroids and bronchodilators.   Assessment & Plan:   Active Problems:   Chronic pain   Esophageal reflux   COPD exacerbation (HCC)   Essential hypertension   Chronic respiratory failure with hypoxia ([place])   1. COPD exacerbation. Patient reports that he still short of breath on exertion. He has advanced COPD at baseline and progress may be slow. Continue IV steroids, bronchodilators. He is also on oral azithromycin. Continue pulmonary hygiene.  2. Chronic respiratory failure. Patient is currently on 4 L of oxygen. Currently on 5 L. Weight as tolerated.  3. Hypertension. Stable. Continue current regimen.  4. GERD. Continue on PPI.  5. Chronic pain syndrome. Continue on home dose of oxycodone.   DVT prophylaxis: Lovenox Code Status: Full code Family Communication: Discussed with mother at the bedside Disposition Plan: Discharge home once improved.   Consultants:     Procedures:     Antimicrobials:   Azithromycin 4/6>>   Subjective: Reports having nausea and vomiting this morning. Still has constipation. Feels increasingly short of breath on exertion. Staff reports that he became hypoxic on 4 L and had to be bumped up to 5 L.  Objective: Vitals:   10/22/16 0605 10/22/16 0723 10/22/16 1016 10/22/16 1313  BP: 104/70     Pulse: 89     Resp: 18     Temp: 98.2 F (36.8 C)     TempSrc: Oral     SpO2: 92% 95% (!) 89% 92%  Weight:      Height:        Intake/Output Summary (Last 24 hours) at 10/22/16 1438 Last data filed at 10/22/16 1223  Gross per 24 hour  Intake              960 ml  Output              801 ml  Net              159 ml   Filed  Weights   10/20/16 1934 10/20/16 2354 10/20/16 2358  Weight: 87.5 kg (193 lb) 87.5 kg (192 lb 14.4 oz) 87.5 kg (192 lb 12.8 oz)    Examination:  General exam: Appears calm and comfortable  Respiratory system: mild wheeze bilaterally. Respiratory effort normal. Cardiovascular system: S1 & S2 heard, RRR. No JVD, murmurs, rubs, gallops or clicks. No pedal edema. Gastrointestinal system: Abdomen is nondistended, soft and nontender. No organomegaly or masses felt. Normal bowel sounds heard. Central nervous system: Alert and oriented. No focal neurological deficits. Extremities: Symmetric 5 x 5 power. Skin: No rashes, lesions or ulcers Psychiatry: Judgement and insight appear normal. Mood & affect appropriate.     Data Reviewed: I have personally reviewed following labs and imaging studies  CBC:  Recent Labs Lab 10/20/16 2017 10/21/16 0626  WBC 19.5* 14.8*  NEUTROABS 13.4*  --   HGB 16.4 14.7  HCT 48.4 44.8  MCV 88.0 88.7  PLT 289 497   Basic Metabolic Panel:  Recent Labs Lab 10/20/16 2017 10/21/16 0626  NA 134* 137  K 3.4* 4.3  CL 95* 99*  CO2 30 31  GLUCOSE 119* 148*  BUN 13 13  CREATININE 0.87 0.90  CALCIUM 9.4 8.6*   GFR: Estimated Creatinine Clearance: 106.9 mL/min (by C-G formula based on SCr of 0.9 mg/dL). Liver Function Tests:  Recent Labs Lab 10/21/16 0626  AST 15  ALT 12*  ALKPHOS 49  BILITOT 0.7  PROT 6.7  ALBUMIN 3.7   No results for input(s): LIPASE, AMYLASE in the last 168 hours. No results for input(s): AMMONIA in the last 168 hours. Coagulation Profile: No results for input(s): INR, PROTIME in the last 168 hours. Cardiac Enzymes: No results for input(s): CKTOTAL, CKMB, CKMBINDEX, TROPONINI in the last 168 hours. BNP (last 3 results) No results for input(s): PROBNP in the last 8760 hours. HbA1C: No results for input(s): HGBA1C in the last 72 hours. CBG: No results for input(s): GLUCAP in the last 168 hours. Lipid Profile: No results  for input(s): CHOL, HDL, LDLCALC, TRIG, CHOLHDL, LDLDIRECT in the last 72 hours. Thyroid Function Tests: No results for input(s): TSH, T4TOTAL, FREET4, T3FREE, THYROIDAB in the last 72 hours. Anemia Panel: No results for input(s): VITAMINB12, FOLATE, FERRITIN, TIBC, IRON, RETICCTPCT in the last 72 hours. Sepsis Labs: No results for input(s): PROCALCITON, LATICACIDVEN in the last 168 hours.  No results found for this or any previous visit (from the past 240 hour(s)).       Radiology Studies: Dg Chest 2 View  Result Date: 10/20/2016 CLINICAL DATA:  Shortness of breath and coughing 2 days. Oxygen dependent for COPD. EXAM: CHEST  2 VIEW COMPARISON:  10/04/2016 and 02/28/2016 FINDINGS: Lungs are adequately inflated without focal consolidation or effusion. Cardiomediastinal silhouette is within normal. Multiple old left rib fractures unchanged. Degenerative change of the spine. IMPRESSION: No active cardiopulmonary disease. Electronically Signed   By: Marin Olp M.D.   On: 10/20/2016 19:47        Scheduled Meds: . aspirin EC  81 mg Oral Daily  . azithromycin  250 mg Oral Q2200  . cyclobenzaprine  10 mg Oral TID  . enoxaparin (LOVENOX) injection  40 mg Subcutaneous Q24H  . guaiFENesin  1,200 mg Oral BID  . hydrochlorothiazide  25 mg Oral Daily  . ipratropium-albuterol  3 mL Nebulization Q6H  . linaclotide  145 mcg Oral QAC breakfast  . methylPREDNISolone (SOLU-MEDROL) injection  60 mg Intravenous Q6H  . nicotine  21 mg Transdermal Daily  . oxyCODONE  5 mg Oral Q12H  . pantoprazole  40 mg Oral BID  . simvastatin  40 mg Oral QPM  . sodium chloride flush  3 mL Intravenous Q12H  . traZODone  150 mg Oral QHS   Continuous Infusions:    LOS: 1 day    Time spent: 2mins    Kathie Dike, MD [facility] Pager [phone]  If 7PM-7AM, please contact night-coverage [email or web address] Password [facility] 10/22/2016, 2:38 PM

## 2016-10-23 ENCOUNTER — Encounter (HOSPITAL_COMMUNITY)
Admission: EM | Disposition: A | Discharge status: Home / Self Care | Payer: Self-pay | Source: Home / Self Care | Attending: Internal Medicine

## 2016-10-23 ENCOUNTER — Ambulatory Visit (HOSPITAL_COMMUNITY): Admission: RE | Admit: 2016-10-23 | Payer: Medicaid Other | Source: Ambulatory Visit | Admitting: Internal Medicine

## 2016-10-23 ENCOUNTER — Encounter: Payer: Self-pay | Admitting: Internal Medicine

## 2016-10-23 SURGERY — COLONOSCOPY WITH PROPOFOL
Anesthesia: Monitor Anesthesia Care

## 2016-10-23 MED ORDER — AZITHROMYCIN 250 MG PO TABS: 250.0000 mg | ORAL_TABLET | Freq: Every day | ORAL | 0 refills | Status: AC

## 2016-10-23 MED ORDER — GUAIFENESIN ER 600 MG PO TB12
600.0000 mg | ORAL_TABLET | Freq: Two times a day (BID) | ORAL | 0 refills | Status: DC
Start: 2016-10-23 — End: 2016-11-22

## 2016-10-23 MED ORDER — PREDNISONE 10 MG PO TABS: ORAL_TABLET | ORAL | 0 refills | Status: DC

## 2016-10-23 MED ORDER — BENZONATATE 100 MG PO CAPS: 100.0000 mg | ORAL_CAPSULE | Freq: Three times a day (TID) | ORAL | 0 refills | Status: DC | PRN

## 2016-10-23 NOTE — Care Management Note (Addendum)
Case Management Note  Patient Details  Name: Sean Thomas MRN: [id number] Date of Birth: 12/08/1960  Subjective/Objective:                  Pt admitted with COPD exacerbation. Chart reviewed for CM needs. He is form home, lives with family and is ind with ADL's. He has PCP, transportation to appointments and insurance with drug coverage. He has home oxygen and neb machine. He has ambulated.   Action/Plan: Pt discharging home today with self care. No needs identified.   Expected Discharge Date:  10/23/16               Expected Discharge Plan:  Home/Self Care  In-House Referral:  NA  Discharge planning Services  CM Consult  Post Acute Care Choice:  NA Choice offered to:  NA  Status of Service:  Completed, signed off   Sherald Barge, RN 10/23/2016, 1:44 PM

## 2016-10-23 NOTE — Discharge Summary (Signed)
Physician Discharge Summary  Sean Thomas [id number] DOB: September 11, 1960 DOA: 10/20/2016  PCP: Kerin Perna, NP  Admit date: 10/20/2016 Discharge date: 10/23/2016  Admitted From: home Disposition:  home  Recommendations for Outpatient Follow-up:  1. Follow up with PCP in 1-2 weeks 2. Please obtain BMP/CBC in one week  Discharge Condition:stable CODE STATUS: home Diet recommendation: Heart Healthy  Brief/Interim Summary: 56 year old male with a history of COPD on home oxygen, presents to the hospital with progressive shortness of breath. Found to have COPD exacerbation. Admitted for further treatments with steroids and bronchodilators.  Discharge Diagnoses:  Active Problems:   Chronic pain   Esophageal reflux   COPD exacerbation (HCC)   Essential hypertension   Chronic respiratory failure with hypoxia ([place])  1. COPD exacerbation. Patient was treated with intravenous steroids, bronchodilators and antibiotics. He has significant COPD at baseline. Overall wheezing has improved. He is able to ambulate without significant difficulty. Appears to be approaching his baseline. He'll be placed on a prednisone taper. Continue pulmonary hygiene.  2. Chronic respiratory failure. Patient is chronically on 4 L of oxygen. He briefly required 5 L, but is now back on 4 L.  3. Hypertension. Stable. Continue current regimen.  4. GERD. Continue on PPI.  5. Chronic pain syndrome. Continue on home dose of oxycodone  Discharge Instructions  Discharge Instructions    Diet - low sodium heart healthy    Complete by:  As directed    Increase activity slowly    Complete by:  As directed      Allergies as of 10/23/2016      Reactions   Advil [ibuprofen] Diarrhea   Aleve [naproxen Sodium] Nausea And Vomiting   diarrhea      Medication List    TAKE these medications   amitriptyline 10 MG tablet Commonly known as:  ELAVIL Take 10 mg by mouth at bedtime.   aspirin EC 81 MG tablet Take  81 mg by mouth daily.   azithromycin 250 MG tablet Commonly known as:  ZITHROMAX Take 1 tablet (250 mg total) by mouth daily.   benzonatate 100 MG capsule Commonly known as:  TESSALON PERLES Take 1 capsule (100 mg total) by mouth 3 (three) times daily as needed for cough.   cyclobenzaprine 10 MG tablet Commonly known as:  FLEXERIL Take 10 mg by mouth 3 (three) times daily.   docusate sodium 100 MG capsule Commonly known as:  COLACE Take 100 mg by mouth daily.   ferrous sulfate 325 (65 FE) MG tablet Take 325 mg by mouth daily with breakfast.   guaiFENesin 600 MG 12 hr tablet Commonly known as:  MUCINEX Take 1 tablet (600 mg total) by mouth 2 (two) times daily.   hydrochlorothiazide 25 MG tablet Commonly known as:  HYDRODIURIL Take 25 mg by mouth daily.   LINZESS 145 MCG Caps capsule Generic drug:  linaclotide Take 145 mcg by mouth daily before breakfast.   nicotine 21 mg/24hr patch Commonly known as:  NICODERM CQ - dosed in mg/24 hours Place 21 mg onto the skin daily.   nicotine 14 mg/24hr patch Commonly known as:  NICODERM CQ - dosed in mg/24 hours Place 1 patch (14 mg total) onto the skin daily.   ondansetron 4 MG tablet Commonly known as:  ZOFRAN Take 4 mg by mouth every 6 (six) hours as needed for nausea or vomiting.   oxyCODONE 5 MG immediate release tablet Commonly known as:  Oxy IR/ROXICODONE Take 5 mg by mouth every 12 (  twelve) hours.   OXYGEN Inhale into the lungs. 4 liters/min   pantoprazole 40 MG tablet Commonly known as:  PROTONIX TAKE ONE TABLET BY MOUTH TWICE A DAY   polyethylene glycol-electrolytes 420 g solution Commonly known as:  TRILYTE Take 4,000 mLs by mouth as directed.   predniSONE 10 MG tablet Commonly known as:  DELTASONE Take 40mg  po daily for 2 days then 30mg  daily for 2 days then 20mg  daily for 2 days then 10mg  daily for 2 days then stop   PROAIR HFA 108 (90 Base) MCG/ACT inhaler Generic drug:  albuterol INHALE TWO PUFFS  EVERY FOUR HOURS AS NEEDED What changed:  See the new instructions.   albuterol (2.5 MG/3ML) 0.083% nebulizer solution Commonly known as:  PROVENTIL Take 3 mLs (2.5 mg total) by nebulization every 6 (six) hours as needed for wheezing or shortness of breath. What changed:  Another medication with the same name was changed. Make sure you understand how and when to take each.   simvastatin 40 MG tablet Commonly known as:  ZOCOR Take 40 mg by mouth every evening.   STIOLTO RESPIMAT 2.5-2.5 MCG/ACT Aers Generic drug:  Tiotropium Bromide-Olodaterol Inhale 2 puffs into the lungs daily.   traZODone 150 MG tablet Commonly known as:  DESYREL Take 150 mg by mouth at bedtime.       Allergies  Allergen Reactions  . Advil [Ibuprofen] Diarrhea  . Aleve [Naproxen Sodium] Nausea And Vomiting    diarrhea    Consultations:     Procedures/Studies: Dg Chest 2 View  Result Date: 10/20/2016 CLINICAL DATA:  Shortness of breath and coughing 2 days. Oxygen dependent for COPD. EXAM: CHEST  2 VIEW COMPARISON:  10/04/2016 and 02/28/2016 FINDINGS: Lungs are adequately inflated without focal consolidation or effusion. Cardiomediastinal silhouette is within normal. Multiple old left rib fractures unchanged. Degenerative change of the spine. IMPRESSION: No active cardiopulmonary disease. Electronically Signed   By: Marin Olp M.D.   On: 10/20/2016 19:47   Dg Chest 2 View  Result Date: 10/04/2016 CLINICAL DATA:  Shortness of breath, wheezing EXAM: CHEST  2 VIEW COMPARISON:  02/28/2016 FINDINGS: Cardiomediastinal silhouette is stable. Again noted multiple old fracture deformities left ribs. No infiltrate or pleural effusion. No pulmonary edema. Mild degenerative changes mid thoracic spine. IMPRESSION: No active cardiopulmonary disease. Stable old fracture deformities left ribs. Electronically Signed   By: Lahoma Crocker M.D.   On: 10/04/2016 18:31       Subjective: Shortness of breath and wheezing  improving  Discharge Exam: Vitals:   10/22/16 1942 10/23/16 0500  BP: 105/65 116/79  Pulse: (!) 101 85  Resp: 18 18  Temp:  97.8 F (36.6 C)   Vitals:   10/22/16 1313 10/22/16 1942 10/22/16 1954 10/23/16 0500  BP: (!) 146/76 105/65  116/79  Pulse: (!) 114 (!) 101  85  Resp: 18 18  18   Temp: 97.4 F (36.3 C)   97.8 F (36.6 C)  TempSrc: Oral   Oral  SpO2: 92% 93% 94% 96%  Weight:      Height:        General: Pt is alert, awake, not in acute distress Cardiovascular: RRR, S1/S2 +, no rubs, no gallops Respiratory: CTA bilaterally, no wheezing, no rhonchi Abdominal: Soft, NT, ND, bowel sounds + Extremities: no edema, no cyanosis    The results of significant diagnostics from this hospitalization (including imaging, microbiology, ancillary and laboratory) are listed below for reference.     Microbiology: No results found for this  or any previous visit (from the past 240 hour(s)).   Labs: BNP (last 3 results) No results for input(s): BNP in the last 8760 hours. Basic Metabolic Panel:  Recent Labs Lab 10/20/16 2017 10/21/16 0626  NA 134* 137  K 3.4* 4.3  CL 95* 99*  CO2 30 31  GLUCOSE 119* 148*  BUN 13 13  CREATININE 0.87 0.90  CALCIUM 9.4 8.6*   Liver Function Tests:  Recent Labs Lab 10/21/16 0626  AST 15  ALT 12*  ALKPHOS 49  BILITOT 0.7  PROT 6.7  ALBUMIN 3.7   No results for input(s): LIPASE, AMYLASE in the last 168 hours. No results for input(s): AMMONIA in the last 168 hours. CBC:  Recent Labs Lab 10/20/16 2017 10/21/16 0626  WBC 19.5* 14.8*  NEUTROABS 13.4*  --   HGB 16.4 14.7  HCT 48.4 44.8  MCV 88.0 88.7  PLT 289 286   Cardiac Enzymes: No results for input(s): CKTOTAL, CKMB, CKMBINDEX, TROPONINI in the last 168 hours. BNP: Invalid input(s): POCBNP CBG: No results for input(s): GLUCAP in the last 168 hours. D-Dimer No results for input(s): DDIMER in the last 72 hours. Hgb A1c No results for input(s): HGBA1C in the last 72  hours. Lipid Profile No results for input(s): CHOL, HDL, LDLCALC, TRIG, CHOLHDL, LDLDIRECT in the last 72 hours. Thyroid function studies No results for input(s): TSH, T4TOTAL, T3FREE, THYROIDAB in the last 72 hours.  Invalid input(s): FREET3 Anemia work up No results for input(s): VITAMINB12, FOLATE, FERRITIN, TIBC, IRON, RETICCTPCT in the last 72 hours. Urinalysis    Component Value Date/Time   COLORURINE YELLOW 02/25/2016 [place] 02/25/2016 1635   LABSPEC 1.010 02/25/2016 1635   PHURINE 6.5 02/25/2016 1635   GLUCOSEU NEGATIVE 02/25/2016 1635   HGBUR NEGATIVE 02/25/2016 1635   BILIRUBINUR NEGATIVE 02/25/2016 1635   KETONESUR TRACE (A) 02/25/2016 1635   PROTEINUR 30 (A) 02/25/2016 1635   NITRITE NEGATIVE 02/25/2016 1635   LEUKOCYTESUR NEGATIVE 02/25/2016 1635   Sepsis Labs Invalid input(s): PROCALCITONIN,  WBC,  LACTICIDVEN Microbiology No results found for this or any previous visit (from the past 240 hour(s)).   Time coordinating discharge: Over 30 minutes  SIGNED:   Kathie Dike, MD  [facility] 10/23/2016, 1:35 PM Pager   If 7PM-7AM, please contact night-coverage [email or web address] Password [email or web address]

## 2016-10-23 NOTE — Progress Notes (Signed)
Discharge instructions given, verbalized understanding, out in stable condition via w/c with staff. 

## 2016-10-29 ENCOUNTER — Inpatient Hospital Stay (HOSPITAL_COMMUNITY)
Admission: EM | Admit: 2016-10-29 | Discharge: 2016-11-02 | DRG: 190 | Disposition: A | Discharge status: Home / Self Care | Payer: Medicaid Other | Attending: Internal Medicine | Admitting: Internal Medicine

## 2016-10-29 ENCOUNTER — Emergency Department (HOSPITAL_COMMUNITY): Payer: Medicaid Other

## 2016-10-29 ENCOUNTER — Encounter (HOSPITAL_COMMUNITY): Payer: Self-pay | Admitting: *Deleted

## 2016-10-29 DIAGNOSIS — Z7982 Long term (current) use of aspirin: Secondary | ICD-10-CM

## 2016-10-29 DIAGNOSIS — Z886 Allergy status to analgesic agent status: Secondary | ICD-10-CM

## 2016-10-29 DIAGNOSIS — E785 Hyperlipidemia, unspecified: Secondary | ICD-10-CM | POA: Diagnosis present

## 2016-10-29 DIAGNOSIS — J9620 Acute and chronic respiratory failure, unspecified whether with hypoxia or hypercapnia: Secondary | ICD-10-CM | POA: Diagnosis present

## 2016-10-29 DIAGNOSIS — G894 Chronic pain syndrome: Secondary | ICD-10-CM | POA: Diagnosis present

## 2016-10-29 DIAGNOSIS — T380X5A Adverse effect of glucocorticoids and synthetic analogues, initial encounter: Secondary | ICD-10-CM | POA: Diagnosis present

## 2016-10-29 DIAGNOSIS — F17200 Nicotine dependence, unspecified, uncomplicated: Secondary | ICD-10-CM

## 2016-10-29 DIAGNOSIS — F17210 Nicotine dependence, cigarettes, uncomplicated: Secondary | ICD-10-CM | POA: Diagnosis present

## 2016-10-29 DIAGNOSIS — Z79891 Long term (current) use of opiate analgesic: Secondary | ICD-10-CM

## 2016-10-29 DIAGNOSIS — R509 Fever, unspecified: Secondary | ICD-10-CM | POA: Diagnosis present

## 2016-10-29 DIAGNOSIS — D72829 Elevated white blood cell count, unspecified: Secondary | ICD-10-CM | POA: Diagnosis present

## 2016-10-29 DIAGNOSIS — Z7951 Long term (current) use of inhaled steroids: Secondary | ICD-10-CM

## 2016-10-29 DIAGNOSIS — Z9981 Dependence on supplemental oxygen: Secondary | ICD-10-CM

## 2016-10-29 DIAGNOSIS — I10 Essential (primary) hypertension: Secondary | ICD-10-CM | POA: Diagnosis present

## 2016-10-29 DIAGNOSIS — Z79899 Other long term (current) drug therapy: Secondary | ICD-10-CM

## 2016-10-29 DIAGNOSIS — G8929 Other chronic pain: Secondary | ICD-10-CM | POA: Diagnosis present

## 2016-10-29 DIAGNOSIS — J441 Chronic obstructive pulmonary disease with (acute) exacerbation: Principal | ICD-10-CM | POA: Diagnosis present

## 2016-10-29 DIAGNOSIS — J9621 Acute and chronic respiratory failure with hypoxia: Secondary | ICD-10-CM | POA: Diagnosis present

## 2016-10-29 DIAGNOSIS — D509 Iron deficiency anemia, unspecified: Secondary | ICD-10-CM | POA: Diagnosis present

## 2016-10-29 DIAGNOSIS — K219 Gastro-esophageal reflux disease without esophagitis: Secondary | ICD-10-CM | POA: Diagnosis present

## 2016-10-29 LAB — BLOOD GAS, ARTERIAL
ACID-BASE EXCESS: 7 mmol/L — AB (ref 0.0–2.0)
BICARBONATE: 29.7 mmol/L — AB (ref 20.0–28.0)
Drawn by: 22223
FIO2: 21
O2 Saturation: 91.6 %
PCO2 ART: 49.5 mmHg — AB (ref 32.0–48.0)
PH ART: 7.42 (ref 7.350–7.450)
pO2, Arterial: 62.6 mmHg — ABNORMAL LOW (ref 83.0–108.0)

## 2016-10-29 LAB — BASIC METABOLIC PANEL
Anion gap: 10 (ref 5–15)
BUN: 13 mg/dL (ref 6–20)
CALCIUM: 8.8 mg/dL — AB (ref 8.9–10.3)
CO2: 32 mmol/L (ref 22–32)
CREATININE: 0.84 mg/dL (ref 0.61–1.24)
Chloride: 90 mmol/L — ABNORMAL LOW (ref 101–111)
GFR calc non Af Amer: >60 mL/min (ref >60)
GLUCOSE: 141 mg/dL — AB (ref 65–99)
Potassium: 4.5 mmol/L (ref 3.5–5.1)
Sodium: 132 mmol/L — ABNORMAL LOW (ref 135–145)

## 2016-10-29 LAB — TROPONIN I

## 2016-10-29 LAB — CBC
HCT: 49.8 % (ref 39.0–52.0)
Hemoglobin: 16.6 g/dL (ref 13.0–17.0)
MCH: 29.9 pg (ref 26.0–34.0)
MCHC: 33.3 g/dL (ref 30.0–36.0)
MCV: 89.6 fL (ref 78.0–100.0)
PLATELETS: 254 10*3/uL (ref 150–400)
RBC: 5.56 MIL/uL (ref 4.22–5.81)
RDW: 15 % (ref 11.5–15.5)
WBC: 22.6 10*3/uL — ABNORMAL HIGH (ref 4.0–10.5)

## 2016-10-29 LAB — BRAIN NATRIURETIC PEPTIDE: B Natriuretic Peptide: 40 pg/mL (ref 0.0–100.0)

## 2016-10-29 MED ORDER — IPRATROPIUM BROMIDE 0.02 % IN SOLN
0.5000 mg | Freq: Once | RESPIRATORY_TRACT | Status: AC
  Administered 2016-10-29: 0.5 mg via RESPIRATORY_TRACT
  Filled 2016-10-29: qty 2.5

## 2016-10-29 MED ORDER — METHYLPREDNISOLONE SODIUM SUCC 125 MG IJ SOLR: 125.0000 mg | Freq: Once | INTRAMUSCULAR | Status: DC

## 2016-10-29 MED ORDER — ALBUTEROL (5 MG/ML) CONTINUOUS INHALATION SOLN
10.0000 mg/h | INHALATION_SOLUTION | Freq: Once | RESPIRATORY_TRACT | Status: AC
Start: 2016-10-29 — End: 2016-10-29
  Administered 2016-10-29: 10 mg/h via RESPIRATORY_TRACT
  Filled 2016-10-29: qty 20

## 2016-10-29 NOTE — ED Notes (Signed)
Pt resting w/ bipap on.

## 2016-10-29 NOTE — ED Triage Notes (Signed)
Pt c/o sob that became worse tonight with cough that is productive, unsure of color to sputum,
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
## 2016-10-29 NOTE — ED Notes (Signed)
Pt was given 125 mg solu medrol and one 5 mg albuterol treatment enroute to er with ems with some improvement in symptoms,

## 2016-10-30 ENCOUNTER — Encounter (HOSPITAL_COMMUNITY): Payer: Self-pay | Admitting: Family Medicine

## 2016-10-30 DIAGNOSIS — J9620 Acute and chronic respiratory failure, unspecified whether with hypoxia or hypercapnia: Secondary | ICD-10-CM | POA: Diagnosis not present

## 2016-10-30 DIAGNOSIS — F17210 Nicotine dependence, cigarettes, uncomplicated: Secondary | ICD-10-CM | POA: Diagnosis present

## 2016-10-30 DIAGNOSIS — D509 Iron deficiency anemia, unspecified: Secondary | ICD-10-CM

## 2016-10-30 DIAGNOSIS — E785 Hyperlipidemia, unspecified: Secondary | ICD-10-CM

## 2016-10-30 DIAGNOSIS — F17200 Nicotine dependence, unspecified, uncomplicated: Secondary | ICD-10-CM | POA: Diagnosis not present

## 2016-10-30 DIAGNOSIS — K219 Gastro-esophageal reflux disease without esophagitis: Secondary | ICD-10-CM | POA: Diagnosis present

## 2016-10-30 DIAGNOSIS — R0602 Shortness of breath: Secondary | ICD-10-CM | POA: Diagnosis present

## 2016-10-30 DIAGNOSIS — Z7951 Long term (current) use of inhaled steroids: Secondary | ICD-10-CM | POA: Diagnosis not present

## 2016-10-30 DIAGNOSIS — D72829 Elevated white blood cell count, unspecified: Secondary | ICD-10-CM | POA: Diagnosis present

## 2016-10-30 DIAGNOSIS — I10 Essential (primary) hypertension: Secondary | ICD-10-CM | POA: Diagnosis present

## 2016-10-30 DIAGNOSIS — J9621 Acute and chronic respiratory failure with hypoxia: Secondary | ICD-10-CM

## 2016-10-30 DIAGNOSIS — T380X5A Adverse effect of glucocorticoids and synthetic analogues, initial encounter: Secondary | ICD-10-CM | POA: Diagnosis present

## 2016-10-30 DIAGNOSIS — Z9981 Dependence on supplemental oxygen: Secondary | ICD-10-CM | POA: Diagnosis not present

## 2016-10-30 DIAGNOSIS — Z886 Allergy status to analgesic agent status: Secondary | ICD-10-CM | POA: Diagnosis not present

## 2016-10-30 DIAGNOSIS — Z7982 Long term (current) use of aspirin: Secondary | ICD-10-CM | POA: Diagnosis not present

## 2016-10-30 DIAGNOSIS — G894 Chronic pain syndrome: Secondary | ICD-10-CM | POA: Diagnosis present

## 2016-10-30 DIAGNOSIS — J441 Chronic obstructive pulmonary disease with (acute) exacerbation: Principal | ICD-10-CM

## 2016-10-30 DIAGNOSIS — R509 Fever, unspecified: Secondary | ICD-10-CM | POA: Diagnosis present

## 2016-10-30 DIAGNOSIS — Z79899 Other long term (current) drug therapy: Secondary | ICD-10-CM | POA: Diagnosis not present

## 2016-10-30 DIAGNOSIS — Z79891 Long term (current) use of opiate analgesic: Secondary | ICD-10-CM | POA: Diagnosis not present

## 2016-10-30 LAB — BLOOD GAS, ARTERIAL
Acid-Base Excess: 8.1 mmol/L — ABNORMAL HIGH (ref 0.0–2.0)
Bicarbonate: 31.1 mmol/L — ABNORMAL HIGH (ref 20.0–28.0)
DELIVERY SYSTEMS: POSITIVE
Drawn by: 234301
Expiratory PAP: 6
FIO2: 60
INSPIRATORY PAP: 14
O2 Saturation: 98.5 %
Patient temperature: 37
pCO2 arterial: 48.1 mmHg — ABNORMAL HIGH (ref 32.0–48.0)
pH, Arterial: 7.444 (ref 7.350–7.450)
pO2, Arterial: 122 mmHg — ABNORMAL HIGH (ref 83.0–108.0)

## 2016-10-30 LAB — MRSA PCR SCREENING: MRSA BY PCR: NEGATIVE

## 2016-10-30 MED ORDER — ENOXAPARIN SODIUM 40 MG/0.4ML ~~LOC~~ SOLN
40.0000 mg | SUBCUTANEOUS | Status: DC
  Administered 2016-10-30 – 2016-11-01 (×3): 40 mg via SUBCUTANEOUS
  Filled 2016-10-30 (×4): qty 0.4

## 2016-10-30 MED ORDER — FERROUS SULFATE 325 (65 FE) MG PO TABS
325.0000 mg | ORAL_TABLET | Freq: Every day | ORAL | Status: DC
  Administered 2016-10-30 – 2016-11-02 (×4): 325 mg via ORAL
  Filled 2016-10-30 (×4): qty 1

## 2016-10-30 MED ORDER — LINACLOTIDE 145 MCG PO CAPS
145.0000 ug | ORAL_CAPSULE | Freq: Every day | ORAL | Status: DC
  Administered 2016-10-30 – 2016-11-02 (×4): 145 ug via ORAL
  Filled 2016-10-30 (×5): qty 1

## 2016-10-30 MED ORDER — TRAZODONE HCL 50 MG PO TABS
150.0000 mg | ORAL_TABLET | Freq: Every day | ORAL | Status: DC
  Administered 2016-10-30: 150 mg via ORAL
  Filled 2016-10-30: qty 3

## 2016-10-30 MED ORDER — PEG 3350-KCL-NA BICARB-NACL 420 G PO SOLR: 4000.0000 mL | ORAL | Status: DC

## 2016-10-30 MED ORDER — METHYLPREDNISOLONE SODIUM SUCC 125 MG IJ SOLR
80.0000 mg | Freq: Two times a day (BID) | INTRAMUSCULAR | Status: DC
  Administered 2016-10-30 – 2016-10-31 (×3): 80 mg via INTRAVENOUS
  Filled 2016-10-30 (×3): qty 2

## 2016-10-30 MED ORDER — ASPIRIN EC 81 MG PO TBEC
81.0000 mg | DELAYED_RELEASE_TABLET | Freq: Every day | ORAL | Status: DC
  Administered 2016-10-30 – 2016-11-02 (×4): 81 mg via ORAL
  Filled 2016-10-30 (×5): qty 1

## 2016-10-30 MED ORDER — NICOTINE 21 MG/24HR TD PT24
21.0000 mg | MEDICATED_PATCH | Freq: Every day | TRANSDERMAL | Status: DC
  Filled 2016-10-30 (×3): qty 1

## 2016-10-30 MED ORDER — DOXYCYCLINE HYCLATE 100 MG PO TABS
100.0000 mg | ORAL_TABLET | Freq: Two times a day (BID) | ORAL | Status: DC
  Administered 2016-10-30 – 2016-11-01 (×5): 100 mg via ORAL
  Filled 2016-10-30 (×5): qty 1

## 2016-10-30 MED ORDER — SODIUM CHLORIDE 0.9 % IV SOLN: 250.0000 mL | INTRAVENOUS | Status: DC | PRN

## 2016-10-30 MED ORDER — BENZONATATE 100 MG PO CAPS
100.0000 mg | ORAL_CAPSULE | Freq: Three times a day (TID) | ORAL | Status: DC | PRN
  Administered 2016-10-30 – 2016-11-01 (×3): 100 mg via ORAL
  Filled 2016-10-30 (×3): qty 1

## 2016-10-30 MED ORDER — OXYCODONE HCL 5 MG PO TABS
5.0000 mg | ORAL_TABLET | Freq: Two times a day (BID) | ORAL | Status: DC
  Administered 2016-10-30 – 2016-11-02 (×7): 5 mg via ORAL
  Filled 2016-10-30 (×8): qty 1

## 2016-10-30 MED ORDER — IPRATROPIUM BROMIDE 0.02 % IN SOLN: 0.5000 mg | Freq: Four times a day (QID) | RESPIRATORY_TRACT | Status: DC

## 2016-10-30 MED ORDER — SODIUM CHLORIDE 0.9% FLUSH
3.0000 mL | Freq: Two times a day (BID) | INTRAVENOUS | Status: DC
  Administered 2016-10-30 – 2016-11-02 (×8): 3 mL via INTRAVENOUS

## 2016-10-30 MED ORDER — ALBUTEROL SULFATE (2.5 MG/3ML) 0.083% IN NEBU: 2.5000 mg | INHALATION_SOLUTION | Freq: Four times a day (QID) | RESPIRATORY_TRACT | Status: DC

## 2016-10-30 MED ORDER — PANTOPRAZOLE SODIUM 40 MG PO TBEC
40.0000 mg | DELAYED_RELEASE_TABLET | Freq: Two times a day (BID) | ORAL | Status: DC
  Administered 2016-10-30 – 2016-11-02 (×8): 40 mg via ORAL
  Filled 2016-10-30 (×8): qty 1

## 2016-10-30 MED ORDER — AMITRIPTYLINE HCL 10 MG PO TABS
10.0000 mg | ORAL_TABLET | Freq: Every day | ORAL | Status: DC
  Administered 2016-10-30 – 2016-11-01 (×4): 10 mg via ORAL
  Filled 2016-10-30 (×4): qty 1

## 2016-10-30 MED ORDER — DOCUSATE SODIUM 100 MG PO CAPS
100.0000 mg | ORAL_CAPSULE | Freq: Every day | ORAL | Status: DC
  Administered 2016-10-30 – 2016-11-02 (×4): 100 mg via ORAL
  Filled 2016-10-30 (×4): qty 1

## 2016-10-30 MED ORDER — HYDROCHLOROTHIAZIDE 25 MG PO TABS
25.0000 mg | ORAL_TABLET | Freq: Every day | ORAL | Status: DC
  Administered 2016-10-30 – 2016-11-02 (×4): 25 mg via ORAL
  Filled 2016-10-30 (×4): qty 1

## 2016-10-30 MED ORDER — ACETAMINOPHEN 325 MG PO TABS
650.0000 mg | ORAL_TABLET | Freq: Four times a day (QID) | ORAL | Status: DC | PRN
  Administered 2016-10-31: 650 mg via ORAL
  Filled 2016-10-30: qty 2

## 2016-10-30 MED ORDER — GUAIFENESIN ER 600 MG PO TB12
600.0000 mg | ORAL_TABLET | Freq: Two times a day (BID) | ORAL | Status: DC
  Administered 2016-10-30 – 2016-11-01 (×5): 600 mg via ORAL
  Filled 2016-10-30 (×5): qty 1

## 2016-10-30 MED ORDER — SIMVASTATIN 20 MG PO TABS
40.0000 mg | ORAL_TABLET | Freq: Every evening | ORAL | Status: DC
  Administered 2016-10-30 – 2016-11-01 (×3): 40 mg via ORAL
  Filled 2016-10-30 (×3): qty 2

## 2016-10-30 MED ORDER — CYCLOBENZAPRINE HCL 10 MG PO TABS
10.0000 mg | ORAL_TABLET | Freq: Three times a day (TID) | ORAL | Status: DC
  Administered 2016-10-30 – 2016-10-31 (×6): 10 mg via ORAL
  Filled 2016-10-30 (×8): qty 1

## 2016-10-30 MED ORDER — IPRATROPIUM-ALBUTEROL 0.5-2.5 (3) MG/3ML IN SOLN
3.0000 mL | Freq: Four times a day (QID) | RESPIRATORY_TRACT | Status: DC
  Administered 2016-10-30 – 2016-11-01 (×10): 3 mL via RESPIRATORY_TRACT
  Filled 2016-10-30 (×10): qty 3

## 2016-10-30 MED ORDER — SODIUM CHLORIDE 0.9% FLUSH: 3.0000 mL | INTRAVENOUS | Status: DC | PRN

## 2016-10-30 MED ORDER — ALBUTEROL SULFATE (2.5 MG/3ML) 0.083% IN NEBU: 2.5000 mg | INHALATION_SOLUTION | RESPIRATORY_TRACT | Status: DC | PRN

## 2016-10-30 MED ORDER — ONDANSETRON HCL 4 MG/2ML IJ SOLN: 4.0000 mg | Freq: Four times a day (QID) | INTRAMUSCULAR | Status: DC | PRN

## 2016-10-30 MED ORDER — ONDANSETRON HCL 4 MG PO TABS
4.0000 mg | ORAL_TABLET | Freq: Four times a day (QID) | ORAL | Status: DC | PRN
Start: 2016-10-30 — End: 2016-10-30

## 2016-10-30 MED ORDER — ONDANSETRON HCL 4 MG PO TABS: 4.0000 mg | ORAL_TABLET | Freq: Four times a day (QID) | ORAL | Status: DC | PRN

## 2016-10-30 MED ORDER — TRAZODONE HCL 50 MG PO TABS
200.0000 mg | ORAL_TABLET | Freq: Every day | ORAL | Status: DC
  Administered 2016-10-30 – 2016-11-01 (×3): 200 mg via ORAL
  Filled 2016-10-30 (×3): qty 4

## 2016-10-30 NOTE — Plan of Care (Signed)
Problem: Activity: Goal: Risk for activity intolerance will decrease Outcome: Progressing Patient able to get up and use the bathroom with little assistance. He understands the need to move around in the bed and turn often.
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
## 2016-10-30 NOTE — Care Management Note (Signed)
Case Management Note  Patient Details  Name: Sean Thomas MRN: [id number] Date of Birth: Nov 11, 1960  Subjective/Objective:    Pt admitted with acute on chronic respiratory failure. He is form home, lives with family and is ind with ADL's. He has PCP, his mother provides transportation to appointments. Reports no issues affording medications. He has home oxygen, continuous at 4 liters, provided by [employer]. Has port O2 tanks for transport home.               Declines need for HH.   Action/Plan: Anticipate DC home with self care.   Expected Discharge Date:  11/02/16               Expected Discharge Plan:  Home/Self Care  In-House Referral:     Discharge planning Services  CM Consult  Post Acute Care Choice:  NA Choice offered to:  NA  DME Arranged:    DME Agency:     HH Arranged:    HH Agency:     Status of Service:  In process, will continue to follow  If discussed at Long Length of Stay Meetings, dates discussed:    Additional Comments:  Alivea Gladson, Chauncey Reading, RN 10/30/2016, 2:17 PM

## 2016-10-30 NOTE — H&P (Signed)
History and Physical    Sean Thomas [id number]:[id number] DOB: 19-Apr-1961 DOA: 10/29/2016  PCP: Kerin Perna, NP  Patient coming from:  home  Chief Complaint:  Sob, wheezing  HPI: Sean Thomas is a 56 y.o. male with medical history significant of advanced copd on 4 liters at home, HTN, HLD comes in with a day of progressive worsening sob and wheezing.  Pt reports he was discharged last week for same and has not smoked since before last hospitalization.  He was exposed to friends smoking yesterday on the porch.  He denies any swelling in his legs.  No fevers.  Has some cough which is chronic.  On arrival pt was in respiratory distress and had to be placed on bipap, he is feeling much better on bipap.  Pt referred for admission for acute on chronic resp failure due to copde.    Review of Systems: As per HPI otherwise 10 point review of systems negative.   Past Medical History:  Diagnosis Date  . Cervical pain 11/21/2012  . COPD (chronic obstructive pulmonary disease) ([place])   . DDD (degenerative disc disease)   . Fracture of multiple ribs 11/01/2012  . Fracture of occipital condyle ([place]) 11/21/2012  . Headache   . Hyperlipidemia   . Hypertension   . Inguinal hernia 12/19/2013  . Microcytic anemia    Noted August 2017  . Shortness of breath dyspnea     Past Surgical History:  Procedure Laterality Date  . BACK SURGERY    . COLONOSCOPY WITH PROPOFOL N/A 06/15/2016   RMR: inadequate bowel prep all mucosal surfaces not well seen. 5 mm tubular adenoma removed from the hepatic flexure. 3 mm polyp from the rectum was hyperplastic. He had scattered diverticula  . ESOPHAGOGASTRODUODENOSCOPY (EGD) WITH PROPOFOL N/A 06/15/2016   Dr. Gala Romney: Large hiatal hernia, reflux esophagitis, Schatzki ring with focal area of ulceration, status post dilation with 75F Maloney dilator with moderate improvement in luminal narrowing.  . INGUINAL HERNIA REPAIR Left 07/24/2014   Procedure: LAPAROSCOPIC LEFT INGUINAL  HERNIA REPAIR ;  Surgeon: Ralene Ok, MD;  Location: [facility];  Service: General;  Laterality: Left;  . INSERTION OF MESH Left 07/24/2014   Procedure: INSERTION OF MESH;  Surgeon: Ralene Ok, MD;  Location: [facility];  Service: General;  Laterality: Left;  . LUMBAR FUSION    . MALONEY DILATION N/A 06/15/2016   Procedure: Venia Minks DILATION;  Surgeon: Daneil Dolin, MD;  [facility];  Service: Endoscopy;  Laterality: N/A;  . POLYPECTOMY  06/15/2016   Procedure: POLYPECTOMY;  Surgeon: Daneil Dolin, MD;  [facility];  Service: Endoscopy;;  colon  . right lung surgery Right      reports that he has quit smoking. His smoking use included Cigarettes. He has a 9.25 pack-year smoking history. He has never used smokeless tobacco. He reports that he drinks alcohol. He reports that he does not use drugs.  Allergies  Allergen Reactions  . Advil [Ibuprofen] Diarrhea  . Aleve [Naproxen Sodium] Nausea And Vomiting    diarrhea    Family History  Problem Relation Age of Onset  . Diabetes Mother   . Colon cancer Neg Hx   . Liver disease Neg Hx     Prior to Admission medications   Medication Sig Start Date End Date Taking? Authorizing Provider  albuterol (PROVENTIL) (2.5 MG/3ML) 0.083% nebulizer solution Take 3 mLs (2.5 mg total) by nebulization every 6 (six) hours as needed for wheezing or  shortness of breath. 10/04/16  Yes Nat Christen, MD  amitriptyline (ELAVIL) 10 MG tablet Take 10 mg by mouth at bedtime.   Yes Historical Provider, MD  aspirin EC 81 MG tablet Take 81 mg by mouth daily.   Yes Historical Provider, MD  benzonatate (TESSALON PERLES) 100 MG capsule Take 1 capsule (100 mg total) by mouth 3 (three) times daily as needed for cough. 10/23/16  Yes Kathie Dike, MD  cyclobenzaprine (FLEXERIL) 10 MG tablet Take 10 mg by mouth 3 (three) times daily.   Yes Historical Provider, MD  docusate sodium (COLACE) 100 MG capsule Take 100 mg by mouth daily.   Yes Historical  Provider, MD  ferrous sulfate 325 (65 FE) MG tablet Take 325 mg by mouth daily with breakfast.   Yes Historical Provider, MD  guaiFENesin (MUCINEX) 600 MG 12 hr tablet Take 1 tablet (600 mg total) by mouth 2 (two) times daily. 10/23/16  Yes Kathie Dike, MD  hydrochlorothiazide (HYDRODIURIL) 25 MG tablet Take 25 mg by mouth daily.   Yes Historical Provider, MD  linaclotide (LINZESS) 145 MCG CAPS capsule Take 145 mcg by mouth daily before breakfast.   Yes Historical Provider, MD  nicotine (NICODERM CQ - DOSED IN MG/24 HOURS) 21 mg/24hr patch Place 21 mg onto the skin daily.   Yes Historical Provider, MD  ondansetron (ZOFRAN) 4 MG tablet Take 4 mg by mouth every 6 (six) hours as needed for nausea or vomiting.   Yes Historical Provider, MD  oxyCODONE (OXY IR/ROXICODONE) 5 MG immediate release tablet Take 5 mg by mouth every 12 (twelve) hours.   Yes Historical Provider, MD  OXYGEN Inhale into the lungs. 4 liters/min   Yes Historical Provider, MD  pantoprazole (PROTONIX) 40 MG tablet TAKE ONE TABLET BY MOUTH TWICE A DAY 10/12/16  Yes Carlis Stable, NP  predniSONE (DELTASONE) 10 MG tablet Take 40mg  po daily for 2 days then 30mg  daily for 2 days then 20mg  daily for 2 days then 10mg  daily for 2 days then stop 10/23/16  Yes Kathie Dike, MD  simvastatin (ZOCOR) 40 MG tablet Take 40 mg by mouth every evening.    Yes Historical Provider, MD  Tiotropium Bromide-Olodaterol (STIOLTO RESPIMAT) 2.5-2.5 MCG/ACT AERS Inhale 2 puffs into the lungs daily.   Yes Historical Provider, MD  traZODone (DESYREL) 150 MG tablet Take 150 mg by mouth at bedtime.    Yes Historical Provider, MD  nicotine (NICODERM CQ - DOSED IN MG/24 HOURS) 14 mg/24hr patch Place 1 patch (14 mg total) onto the skin daily. Patient not taking: Reported on 10/20/2016 08/07/16   Collene Gobble, MD  polyethylene glycol-electrolytes (TRILYTE) 420 g solution Take 4,000 mLs by mouth as directed. 09/18/16   Daneil Dolin, MD  PROAIR HFA 108 [phone] Base) MCG/ACT  inhaler INHALE TWO PUFFS EVERY FOUR HOURS AS NEEDED Patient taking differently: INHALE TWO PUFFS EVERY FOUR HOURS AS NEEDED FOR SHORTNESS OF BREATH/WHEEZING 09/14/16   Collene Gobble, MD    Physical Exam: Vitals:   10/29/16 2200 10/29/16 2227 10/29/16 2300 10/29/16 2330  BP: 119/80  (!) 135/91 (!) 154/90  Pulse: (!) 132  (!) 126 (!) 125  Resp: (!) 22     Temp:      TempSrc:      SpO2: 95% 96% 97% 95%  Weight:      Height:        Constitutional: NAD, calm, comfortable Vitals:   10/29/16 2200 10/29/16 2227 10/29/16 2300 10/29/16 2330  BP: 119/80  Marland Kitchen)  135/91 (!) 154/90  Pulse: (!) 132  (!) 126 (!) 125  Resp: (!) 22     Temp:      TempSrc:      SpO2: 95% 96% 97% 95%  Weight:      Height:       Eyes: PERRL, lids and conjunctivae normal ENMT: Mucous membranes are moist. Posterior pharynx clear of any exudate or lesions.Normal dentition.  Neck: normal, supple, no masses, no thyromegaly Respiratory: clear to auscultation bilaterally, mild wheezing, no crackles. Normal respiratory effort. No accessory muscle use.  Very diminished Cardiovascular: Regular rate and rhythm, no murmurs / rubs / gallops. No extremity edema. 2+ pedal pulses. No carotid bruits.  Abdomen: no tenderness, no masses palpated. No hepatosplenomegaly. Bowel sounds positive.  Musculoskeletal: no clubbing / cyanosis. No joint deformity upper and lower extremities. Good ROM, no contractures. Normal muscle tone.  Skin: no rashes, lesions, ulcers. No induration Neurologic: CN 2-12 grossly intact. Sensation intact, DTR normal. Strength 5/5 in all 4.  Psychiatric: Normal judgment and insight. Alert and oriented x 3. Normal mood.    Labs on Admission: I have personally reviewed following labs and imaging studies  CBC:  Recent Labs Lab 10/29/16 2230  WBC 22.6*  HGB 16.6  HCT 49.8  MCV 89.6  PLT 250   Basic Metabolic Panel:  Recent Labs Lab 10/29/16 2230  NA 132*  K 4.5  CL 90*  CO2 32  GLUCOSE 141*  BUN  13  CREATININE 0.84  CALCIUM 8.8*   GFR: Estimated Creatinine Clearance: 114.6 mL/min (by C-G formula based on SCr of 0.84 mg/dL).  Cardiac Enzymes:  Recent Labs Lab 10/29/16 2230  TROPONINI <0.03     Radiological Exams on Admission: Dg Chest Port 1 View  Result Date: 10/29/2016 CLINICAL DATA:  Dyspnea EXAM: PORTABLE CHEST 1 VIEW COMPARISON:  10/20/2016 chest radiograph. FINDINGS: Stable cardiomediastinal silhouette with normal heart size. No pneumothorax. No pleural effusion. No pulmonary edema. No acute consolidative airspace disease. Stable deformities in multiple left ribs. IMPRESSION: No active disease. Electronically Signed   By: Ilona Sorrel M.D.   On: 10/29/2016 23:18    EKG: Independently reviewed. Sinus tachycardia, poor quality, no acute issues cxr reviewed no edema or infiltrate Old chart reviewed Case discussed with dr Wille Glaser knapp  Assessment/Plan 56 yo male with acute on chronic respiratory failure secondary to copde  Principal Problem:   Acute on chronic respiratory failure ([place])- treat underlying copde as below.  Cont bipap throught the night and wean as tolerates, mainly using for increased work of breathing on arrival to ED.  ABG looks good.   Active Problems:   COPD with acute exacerbation ([place])- freq nebs.  Iv solumedrol given and continued.  Mentation is normal.   Chronic pain- noted, resume home meds   IDA (iron deficiency anemia)- cont oral iron   Hypertension- cont home meds   Hyperlipidemia- stable    DVT prophylaxis:  scds  Code Status:  full Family Communication:  none Disposition Plan:  Per day team Consults called:  none Admission status:  admission   Burhanuddin Kohlmann A MD [facility]  If 7PM-7AM, please contact night-coverage [email or web address] Password [facility]  10/30/2016, 12:15 AM
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
## 2016-10-31 MED ORDER — METHYLPREDNISOLONE SODIUM SUCC 125 MG IJ SOLR
80.0000 mg | INTRAMUSCULAR | Status: DC
  Administered 2016-11-01 – 2016-11-02 (×2): 80 mg via INTRAVENOUS
  Filled 2016-10-31 (×2): qty 2

## 2016-10-31 NOTE — Progress Notes (Signed)
Patient transferred to 300 via wheelchair. Report called to Blanch Media, Therapist, sports.

## 2016-10-31 NOTE — Plan of Care (Signed)
Problem: Health Behavior/Discharge Planning: Goal: Ability to manage health-related needs will improve Outcome: Progressing Patient understands the need to go home and take medications and use his oxygen like he should so that he does not come back to the hospital.
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
## 2016-11-01 ENCOUNTER — Inpatient Hospital Stay (HOSPITAL_COMMUNITY): Payer: Medicaid Other

## 2016-11-01 DIAGNOSIS — G894 Chronic pain syndrome: Secondary | ICD-10-CM

## 2016-11-01 DIAGNOSIS — R509 Fever, unspecified: Secondary | ICD-10-CM

## 2016-11-01 MED ORDER — MILK AND MOLASSES ENEMA
1.0000 | Freq: Once | RECTAL | Status: AC
  Administered 2016-11-02: 250 mL via RECTAL

## 2016-11-01 MED ORDER — IPRATROPIUM-ALBUTEROL 0.5-2.5 (3) MG/3ML IN SOLN
3.0000 mL | Freq: Four times a day (QID) | RESPIRATORY_TRACT | Status: DC
  Administered 2016-11-02: 3 mL via RESPIRATORY_TRACT
  Filled 2016-11-01 (×2): qty 3

## 2016-11-01 MED ORDER — LEVOFLOXACIN IN D5W 750 MG/150ML IV SOLN
750.0000 mg | INTRAVENOUS | Status: DC
  Administered 2016-11-01: 750 mg via INTRAVENOUS
  Filled 2016-11-01: qty 150

## 2016-11-01 MED ORDER — GUAIFENESIN ER 600 MG PO TB12
1200.0000 mg | ORAL_TABLET | Freq: Two times a day (BID) | ORAL | Status: DC
  Administered 2016-11-01 – 2016-11-02 (×2): 1200 mg via ORAL
  Filled 2016-11-01 (×2): qty 2

## 2016-11-01 NOTE — Progress Notes (Signed)
PROGRESS NOTE    Sean Thomas  [id number]:[id number] DOB: 1961-05-23 DOA: 10/29/2016 PCP: Kerin Perna, NP (   Brief Narrative:  56 year old male with history of oxygen dependent COPD on 4 L, presented with shortness of breath, wheezing exposure to secondhand smoke. Admitted with respiratory distress on BiPAP, COPD exacerbation. Overall improving. Recently developed fever and is undergoing further workup.   Assessment & Plan:   Principal Problem:   Acute on chronic respiratory failure with hypoxia ([facility]) Active Problems:   Chronic pain   IDA (iron deficiency anemia)   COPD with acute exacerbation ([facility])   Hypertension   Hyperlipidemia   Tobacco use disorder   1. Acute on chronic hypoxic respiratory failure. Initially required BiPAP on admission. ABG showed normal pH, PCO2 elevated, but compensated. He has not required BiPAP for 48 hours. Rest of his status appears to be approaching baseline.  2. COPD exacerbation. Continue on steroids, antibiotics and bronchodilators.  3. Fever. Etiology is unclear. Repeat chest x-ray to evaluate for developing pneumonia. He is on antibiotics.  4. Leukocytosis. Suspect this is related to steroids. Continue to monitor.  5. Tobacco use. Continue nicotine patch.  6. Chronic pain syndrome. Continue on chronic pain medications.   DVT prophylaxis: Lovenox Code Status: Full code Family Communication: Discussed with mother at the bedside Disposition Plan: Discharge home once improved   Consultants:     Procedures:     Antimicrobials:   Doxycycline 4/15>>4/18  Levaquin 4/18>>    Subjective: Feeling better. Still has some productive cough. Overall wheezing is better.  Objective: Vitals:   11/01/16 0729 11/01/16 0918 11/01/16 1357 11/01/16 1443  BP:  112/87  122/78  Pulse:    95  Resp:    20  Temp:      TempSrc:      SpO2: 94%  96% 96%  Weight:      Height:        Intake/Output Summary (Last 24 hours) at 11/01/16  1842 Last data filed at 11/01/16 1300  Gross per 24 hour  Intake              483 ml  Output                0 ml  Net              483 ml   Filed Weights   10/29/16 2152 10/30/16 0156 10/31/16 0500  Weight: 88.9 kg (196 lb) 85.3 kg (188 lb 0.8 oz) 84.9 kg (187 lb 2.7 oz)    Examination:  General exam: Appears calm and comfortable  Respiratory system: Clear to auscultation. Respiratory effort normal. Cardiovascular system: S1 & S2 heard, RRR. No JVD, murmurs, rubs, gallops or clicks. No pedal edema. Gastrointestinal system: Abdomen is nondistended, soft and nontender. No organomegaly or masses felt. Normal bowel sounds heard. Central nervous system: Alert and oriented. No focal neurological deficits. Extremities: Symmetric 5 x 5 power. Skin: No rashes, lesions or ulcers Psychiatry: Judgement and insight appear normal. Mood & affect appropriate.     Data Reviewed: I have personally reviewed following labs and imaging studies  CBC:  Recent Labs Lab 10/29/16 2230  WBC 22.6*  HGB 16.6  HCT 49.8  MCV 89.6  PLT 163   Basic Metabolic Panel:  Recent Labs Lab 10/29/16 2230  NA 132*  K 4.5  CL 90*  CO2 32  GLUCOSE 141*  BUN 13  CREATININE 0.84  CALCIUM 8.8*   GFR: Estimated Creatinine Clearance: 114.6  mL/min (by C-G formula based on SCr of 0.84 mg/dL). Liver Function Tests: No results for input(s): AST, ALT, ALKPHOS, BILITOT, PROT, ALBUMIN in the last 168 hours. No results for input(s): LIPASE, AMYLASE in the last 168 hours. No results for input(s): AMMONIA in the last 168 hours. Coagulation Profile: No results for input(s): INR, PROTIME in the last 168 hours. Cardiac Enzymes:  Recent Labs Lab 10/29/16 2230  TROPONINI <0.03   BNP (last 3 results) No results for input(s): PROBNP in the last 8760 hours. HbA1C: No results for input(s): HGBA1C in the last 72 hours. CBG: No results for input(s): GLUCAP in the last 168 hours. Lipid Profile: No results for  input(s): CHOL, HDL, LDLCALC, TRIG, CHOLHDL, LDLDIRECT in the last 72 hours. Thyroid Function Tests: No results for input(s): TSH, T4TOTAL, FREET4, T3FREE, THYROIDAB in the last 72 hours. Anemia Panel: No results for input(s): VITAMINB12, FOLATE, FERRITIN, TIBC, IRON, RETICCTPCT in the last 72 hours. Sepsis Labs: No results for input(s): PROCALCITON, LATICACIDVEN in the last 168 hours.  Recent Results (from the past 240 hour(s))  MRSA PCR Screening     Status: None   Collection Time: 10/30/16  1:51 AM  Result Value Ref Range Status   MRSA by PCR NEGATIVE NEGATIVE Final    Comment:        The GeneXpert MRSA Assay (FDA approved for NASAL specimens only), is one component of a comprehensive MRSA colonization surveillance program. It is not intended to diagnose MRSA infection nor to guide or monitor treatment for MRSA infections.          Radiology Studies: No results found.      Scheduled Meds: . amitriptyline  10 mg Oral QHS  . aspirin EC  81 mg Oral Daily  . cyclobenzaprine  10 mg Oral TID  . docusate sodium  100 mg Oral Daily  . enoxaparin (LOVENOX) injection  40 mg Subcutaneous Q24H  . ferrous sulfate  325 mg Oral Q breakfast  . guaiFENesin  1,200 mg Oral BID  . hydrochlorothiazide  25 mg Oral Daily  . ipratropium-albuterol  3 mL Nebulization Q6H  . linaclotide  145 mcg Oral QAC breakfast  . methylPREDNISolone (SOLU-MEDROL) injection  80 mg Intravenous Q24H  . milk and molasses  1 enema Rectal Once  . nicotine  21 mg Transdermal Daily  . oxyCODONE  5 mg Oral Q12H  . pantoprazole  40 mg Oral BID  . simvastatin  40 mg Oral QPM  . sodium chloride flush  3 mL Intravenous Q12H  . traZODone  200 mg Oral QHS   Continuous Infusions: . sodium chloride    . levofloxacin (LEVAQUIN) IV       LOS: 2 days    Time spent: 59mins    MEMON,JEHANZEB, MD [facility] Pager [phone]  If 7PM-7AM, please contact night-coverage [email or web address] Password  [facility] 11/01/2016, 6:42 PM

## 2016-11-01 NOTE — Progress Notes (Signed)
Pt has refused all attempts to ambulate at this time. Will continue to encourage patient to ambulate throughout the shift.

## 2016-11-02 LAB — BASIC METABOLIC PANEL
Anion gap: 10 (ref 5–15)
BUN: 18 mg/dL (ref 6–20)
CHLORIDE: 85 mmol/L — AB (ref 101–111)
CO2: 32 mmol/L (ref 22–32)
CREATININE: 0.89 mg/dL (ref 0.61–1.24)
Calcium: 8.4 mg/dL — ABNORMAL LOW (ref 8.9–10.3)
GFR calc Af Amer: >60 mL/min (ref >60)
GFR calc non Af Amer: >60 mL/min (ref >60)
GLUCOSE: 175 mg/dL — AB (ref 65–99)
POTASSIUM: 3.7 mmol/L (ref 3.5–5.1)
SODIUM: 127 mmol/L — AB (ref 135–145)

## 2016-11-02 LAB — CBC
HEMATOCRIT: 43.9 % (ref 39.0–52.0)
Hemoglobin: 14.8 g/dL (ref 13.0–17.0)
MCH: 29.6 pg (ref 26.0–34.0)
MCHC: 33.7 g/dL (ref 30.0–36.0)
MCV: 87.8 fL (ref 78.0–100.0)
PLATELETS: 196 10*3/uL (ref 150–400)
RBC: 5 MIL/uL (ref 4.22–5.81)
RDW: 15 % (ref 11.5–15.5)
WBC: 16.5 10*3/uL — ABNORMAL HIGH (ref 4.0–10.5)

## 2016-11-02 MED ORDER — PREDNISONE 10 MG PO TABS: ORAL_TABLET | ORAL | 0 refills | Status: DC

## 2016-11-02 MED ORDER — LEVOFLOXACIN 750 MG PO TABS: 750.0000 mg | ORAL_TABLET | Freq: Every day | ORAL | 0 refills | Status: DC

## 2016-11-02 NOTE — Progress Notes (Signed)
Pt's IV catheter removed and intact. Pt's IV site clean dry and intact. Discharge instructions including medications and follow up appointments were reviewed and discussed with patient. All questions were answered and no further questions at this time. Pt verbalized understanding of discharge instructions. Pt in stable condition and in no acute distress at this time. Pt escorted by nurse tech.

## 2016-11-03 NOTE — Discharge Summary (Signed)
Physician Discharge Summary  Sean Thomas HYQ:[id number] DOB: 12-18-1960 DOA: 10/29/2016  PCP: Kerin Perna, NP  Admit date: 10/29/2016 Discharge date: 11/03/2016  Admitted From: home Disposition:  home  Recommendations for Outpatient Follow-up:  1. Follow up with PCP in 1-2 weeks 2. Please obtain BMP/CBC in one week 3. 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 Admitted with respiratory distress on BiPAP, COPD exacerbation.   Discharge Diagnoses:  Principal Problem:   Acute on chronic respiratory failure with hypoxia (HCC) Active Problems:   Chronic pain   IDA (iron deficiency anemia)   COPD with acute exacerbation (HCC)   Hypertension   Hyperlipidemia   Tobacco use disorder  1. Acute on chronic hypoxic respiratory failure. Initially required BiPAP on admission. ABG showed normal pH, PCO2 elevated, but pH was compensated. Patient was weaned off BiPAP and was back on his baseline oxygen requirement of 4 L. At the time of discharge, he appears his respiratory status is near baseline.  2. COPD exacerbation. Treated with intravenous steroids, bronchodilators and antibiotics. Patient has significantly improved. He's been transitioned to prednisone taper. Continued on a course of antibiotics.  3. Fever. Etiology is unclear. Chest x-ray did not show any evidence of pneumonia. He is being treated with a course of antibiotics. At the time of discharge, he is not having any further fevers.  4. Leukocytosis. Suspect this is related to steroids.   5. Tobacco use. Continue nicotine patch.  6. Chronic pain syndrome. Continue on chronic pain medications.  Discharge  Instructions  Discharge Instructions    Diet - low sodium heart healthy    Complete by:  As directed    Increase activity slowly    Complete by:  As directed      Allergies as of 11/02/2016      Reactions   Advil [ibuprofen] Diarrhea   Aleve [naproxen Sodium] Nausea And Vomiting   diarrhea      Medication List    STOP taking these medications   hydrochlorothiazide 25 MG tablet Commonly known as:  HYDRODIURIL     TAKE these medications   amitriptyline 10 MG tablet Commonly known as:  ELAVIL Take 10 mg by mouth at bedtime.   aspirin EC 81 MG tablet Take 81 mg by mouth daily.   benzonatate 100 MG capsule Commonly known as:  TESSALON PERLES Take 1 capsule (100 mg total) by mouth 3 (three) times daily as needed for cough.   cyclobenzaprine 10 MG tablet Commonly known as:  FLEXERIL Take 10 mg by mouth 3 (three) times daily.   docusate sodium 100 MG capsule Commonly known as:  COLACE Take 100 mg by mouth daily.   ferrous sulfate 325 (65 FE) MG tablet Take 325 mg by mouth daily with breakfast.   guaiFENesin 600 MG 12 hr tablet Commonly known as:  MUCINEX Take 1 tablet (600 mg total) by mouth 2 (two) times daily.   levofloxacin 750 MG tablet Commonly known as:  LEVAQUIN Take 1 tablet (750 mg total) by mouth daily.   LINZESS 145 MCG Caps capsule Generic drug:  linaclotide Take 145 mcg by mouth daily before breakfast.   nicotine 21 mg/24hr patch Commonly known as:  NICODERM CQ - dosed in mg/24 hours Place 21 mg  onto the skin daily. What changed:  Another medication with the same name was removed. Continue taking this medication, and follow the directions you see here.   ondansetron 4 MG tablet Commonly known as:  ZOFRAN Take 4 mg by mouth every 6 (six) hours as needed for nausea or vomiting.   oxyCODONE 5 MG immediate release tablet Commonly known as:  Oxy IR/ROXICODONE Take 5 mg by mouth every 12 (twelve) hours.   OXYGEN Inhale into the lungs. 4  liters/min   pantoprazole 40 MG tablet Commonly known as:  PROTONIX TAKE ONE TABLET BY MOUTH TWICE A DAY   polyethylene glycol-electrolytes 420 g solution Commonly known as:  TRILYTE Take 4,000 mLs by mouth as directed.   predniSONE 10 MG tablet Commonly known as:  DELTASONE Take 40mg  po daily for 3 days then 30mg  daily for 3 days then 20mg  daily for 3 days then 10mg  daily for 3 days then stop What changed:  additional instructions   PROAIR HFA 108 (90 Base) MCG/ACT inhaler Generic drug:  albuterol INHALE TWO PUFFS EVERY FOUR HOURS AS NEEDED What changed:  See the new instructions.   albuterol (2.5 MG/3ML) 0.083% nebulizer solution Commonly known as:  PROVENTIL Take 3 mLs (2.5 mg total) by nebulization every 6 (six) hours as needed for wheezing or shortness of breath. What changed:  Another medication with the same name was changed. Make sure you understand how and when to take each.   simvastatin 40 MG tablet Commonly known as:  ZOCOR Take 40 mg by mouth every evening.   STIOLTO RESPIMAT 2.5-2.5 MCG/ACT Aers Generic drug:  Tiotropium Bromide-Olodaterol Inhale 2 puffs into the lungs daily.   traZODone 150 MG tablet Commonly known as:  DESYREL Take 150 mg by mouth at bedtime.       Allergies  Allergen Reactions  . Advil [Ibuprofen] Diarrhea  . Aleve [Naproxen Sodium] Nausea And Vomiting    diarrhea    Consultations:     Procedures/Studies: Dg Chest 2 View  Result Date: 11/01/2016 CLINICAL DATA:  Fever.  Acute on chronic respiratory failure. EXAM: CHEST  2 VIEW COMPARISON:  10/29/2016 FINDINGS: Increased bronchial thickening from prior exam. No confluent airspace disease. Unchanged heart size and mediastinal contours. Extensive posttraumatic deformity of the left chest wall with multiple old rib fractures. No pleural fluid or pneumothorax. IMPRESSION: Progressive bronchial thickening consistent with acute bronchitis/ COPD exacerbation. No focal consolidation.  Electronically Signed   By: Jeb Levering M.D.   On: 11/01/2016 21:06   Dg Chest 2 View  Result Date: 10/20/2016 CLINICAL DATA:  Shortness of breath and coughing 2 days. Oxygen dependent for COPD. EXAM: CHEST  2 VIEW COMPARISON:  10/04/2016 and 02/28/2016 FINDINGS: Lungs are adequately inflated without focal consolidation or effusion. Cardiomediastinal silhouette is within normal. Multiple old left rib fractures unchanged. Degenerative change of the spine. IMPRESSION: No active cardiopulmonary disease. Electronically Signed   By: Marin Olp M.D.   On: 10/20/2016 19:47   Dg Chest Port 1 View  Result Date: 10/29/2016 CLINICAL DATA:  Dyspnea EXAM: PORTABLE CHEST 1 VIEW COMPARISON:  10/20/2016 chest radiograph. FINDINGS: Stable cardiomediastinal silhouette with normal heart size. No pneumothorax. No pleural effusion. No pulmonary edema. No acute consolidative airspace disease. Stable deformities in multiple left ribs. IMPRESSION: No active disease. Electronically Signed   By: Ilona Sorrel M.D.   On: 10/29/2016 23:18       Subjective: Feeling better. Wheezing has resolved. Continues to have productive cough.  Discharge Exam: Vitals:  11/01/16 2035 11/02/16 0544  BP: 118/85 117/80  Pulse: 93 93  Resp: 18 18  Temp: 98.6 F (37 C) 98.8 F (37.1 C)   Vitals:   11/01/16 2035 11/02/16 0544 11/02/16 0826 11/02/16 1012  BP: 118/85 117/80    Pulse: 93 93    Resp: 18 18    Temp: 98.6 F (37 C) 98.8 F (37.1 C)    TempSrc: Oral Oral    SpO2: 93% 96% 92% 95%  Weight:      Height:        General: Pt is alert, awake, not in acute distress Cardiovascular: RRR, S1/S2 +, no rubs, no gallops Respiratory: CTA bilaterally, no wheezing, no rhonchi Abdominal: Soft, NT, ND, bowel sounds + Extremities: no edema, no cyanosis    The results of significant diagnostics from this hospitalization (including imaging, microbiology, ancillary and laboratory) are listed below for reference.      Microbiology: Recent Results (from the past 240 hour(s))  MRSA PCR Screening     Status: None   Collection Time: 10/30/16  1:51 AM  Result Value Ref Range Status   MRSA by PCR NEGATIVE NEGATIVE Final    Comment:        The GeneXpert MRSA Assay (FDA approved for NASAL specimens only), is one component of a comprehensive MRSA colonization surveillance program. It is not intended to diagnose MRSA infection nor to guide or monitor treatment for MRSA infections.      Labs: BNP (last 3 results)  Recent Labs  10/29/16 2230  BNP 26.9   Basic Metabolic Panel:  Recent Labs Lab 10/29/16 2230 11/02/16 0559  NA 132* 127*  K 4.5 3.7  CL 90* 85*  CO2 32 32  GLUCOSE 141* 175*  BUN 13 18  CREATININE 0.84 0.89  CALCIUM 8.8* 8.4*   Liver Function Tests: No results for input(s): AST, ALT, ALKPHOS, BILITOT, PROT, ALBUMIN in the last 168 hours. No results for input(s): LIPASE, AMYLASE in the last 168 hours. No results for input(s): AMMONIA in the last 168 hours. CBC:  Recent Labs Lab 10/29/16 2230 11/02/16 0559  WBC 22.6* 16.5*  HGB 16.6 14.8  HCT 49.8 43.9  MCV 89.6 87.8  PLT 254 196   Cardiac Enzymes:  Recent Labs Lab 10/29/16 2230  TROPONINI <0.03   BNP: Invalid input(s): POCBNP CBG: No results for input(s): GLUCAP in the last 168 hours. D-Dimer No results for input(s): DDIMER in the last 72 hours. Hgb A1c No results for input(s): HGBA1C in the last 72 hours. Lipid Profile No results for input(s): CHOL, HDL, LDLCALC, TRIG, CHOLHDL, LDLDIRECT in the last 72 hours. Thyroid function studies No results for input(s): TSH, T4TOTAL, T3FREE, THYROIDAB in the last 72 hours.  Invalid input(s): FREET3 Anemia work up No results for input(s): VITAMINB12, FOLATE, FERRITIN, TIBC, IRON, RETICCTPCT in the last 72 hours. Urinalysis    Component Value Date/Time   COLORURINE YELLOW 02/25/2016 1635   APPEARANCEUR CLEAR 02/25/2016 1635   LABSPEC 1.010 02/25/2016  1635   PHURINE 6.5 02/25/2016 1635   GLUCOSEU NEGATIVE 02/25/2016 1635   HGBUR NEGATIVE 02/25/2016 1635   BILIRUBINUR NEGATIVE 02/25/2016 1635   KETONESUR TRACE (A) 02/25/2016 1635   PROTEINUR 30 (A) 02/25/2016 1635   NITRITE NEGATIVE 02/25/2016 1635   LEUKOCYTESUR NEGATIVE 02/25/2016 1635   Sepsis Labs Invalid input(s): PROCALCITONIN,  WBC,  LACTICIDVEN Microbiology Recent Results (from the past 240 hour(s))  MRSA PCR Screening     Status: None   Collection Time: 10/30/16  1:51 AM  Result Value Ref Range Status   MRSA by PCR NEGATIVE NEGATIVE Final    Comment:        The GeneXpert MRSA Assay (FDA approved for NASAL specimens only), is one component of a comprehensive MRSA colonization surveillance program. It is not intended to diagnose MRSA infection nor to guide or monitor treatment for MRSA infections.      Time coordinating discharge: Over 30 minutes  SIGNED:   Kathie Dike, MD  [facility] 11/03/2016, 7:26 PM Pager   If 7PM-7AM, please contact night-coverage [email or web address] Password [email or web address]

## 2016-11-06 ENCOUNTER — Ambulatory Visit (INDEPENDENT_AMBULATORY_CARE_PROVIDER_SITE_OTHER): Payer: Medicaid Other | Admitting: Emergency Medicine

## 2016-11-06 ENCOUNTER — Encounter: Payer: Self-pay | Admitting: Emergency Medicine

## 2016-11-06 DIAGNOSIS — J438 Other emphysema: Secondary | ICD-10-CM | POA: Diagnosis not present

## 2016-11-06 MED ORDER — PREDNISONE 10 MG PO TABS: 10.0000 mg | ORAL_TABLET | Freq: Every day | ORAL | 0 refills | Status: DC

## 2016-11-06 NOTE — Patient Instructions (Signed)
Please continue Anoro (not Stiolto) once a day.  Use albuterol 2 puffs as needed for shortness of breath We will perform walking oximetry today to requalify for o2 We will try starting prednisone 10mg  daily tro see if you benefit You need to stop smoking completely Follow with Dr Lamonte Sakai next available to review.
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
## 2016-11-06 NOTE — Assessment & Plan Note (Addendum)
With freq exacerbations and daily sx despite max therapy.  Discussed severity of his disease with them, that this disease will ultimately take his life. We will need to begin the process of [place] / end-of-life discussions.   Please continue Anoro (not Stiolto) once a day.  Use albuterol 2 puffs as needed for shortness of breath We will perform walking oximetry today to requalify for o2 We will try starting prednisone 10mg  daily tro see if you benefit You need to stop smoking completely Follow with Dr Lamonte Sakai next available to review.

## 2016-11-06 NOTE — Addendum Note (Signed)
Addended by: Collier Salina on: 11/06/2016 12:33 PM   Modules accepted: Orders

## 2016-11-22 ENCOUNTER — Other Ambulatory Visit: Payer: Self-pay

## 2016-11-22 ENCOUNTER — Encounter: Payer: Self-pay | Admitting: Gastroenterology

## 2016-11-22 ENCOUNTER — Ambulatory Visit (INDEPENDENT_AMBULATORY_CARE_PROVIDER_SITE_OTHER): Payer: Medicaid Other | Admitting: Gastroenterology

## 2016-11-22 VITALS — BP 120/84 | HR 107 | Temp 96.1°F | Ht 75.0 in | Wt 186.2 lb

## 2016-11-22 DIAGNOSIS — D509 Iron deficiency anemia, unspecified: Secondary | ICD-10-CM | POA: Diagnosis not present

## 2016-11-22 MED ORDER — PEG 3350-KCL-NA BICARB-NACL 420 G PO SOLR: 4000.0000 mL | ORAL | 0 refills | Status: DC

## 2016-11-22 NOTE — Progress Notes (Signed)
Primary Care Physician:  Kerin Perna, NP  Primary Gastroenterologist:  Garfield Cornea, MD   Chief Complaint  Patient presents with  . Anemia    f/u, reschedule colonoscopy  . Constipation    HPI:  Sean Thomas is a 56 y.o. male here to reschedule his canceled colonoscopy last month. Patient was admitted twice in April with COPD exacerbation. Reason for colonoscopy was iron deficiency. He had attempted colonoscopy back in November 2017 for the same but prep was not adequate. Recheck labs in February, hemoglobin had improved to low normal (had been 8.5 back in August 2017) but his ferritin had declined to 15. He was placed on iron supplements at that time. Clinically from a GI standpoint patient is doing fine. Bowel movements are regular and soft at this point with increased dietary fiber. Denies abdominal pain. No bright red blood per rectum or melena. Upper GI symptoms controlled.  Patient really wants to complete a colonoscopy. He understands he has severe lung disease. Ultimately he'll be up to anesthesia is whether or not he can be sedated safely for the procedure. We'll also run this by Dr. Gala Romney.   Current Outpatient Prescriptions  Medication Sig Dispense Refill  . albuterol (PROVENTIL) (2.5 MG/3ML) 0.083% nebulizer solution Take 3 mLs (2.5 mg total) by nebulization every 6 (six) hours as needed for wheezing or shortness of breath. 75 mL 3  . amitriptyline (ELAVIL) 10 MG tablet Take 10 mg by mouth at bedtime.    Marland Kitchen aspirin EC 81 MG tablet Take 81 mg by mouth daily.    . benzonatate (TESSALON PERLES) 100 MG capsule Take 1 capsule (100 mg total) by mouth 3 (three) times daily as needed for cough. 20 capsule 0  . docusate sodium (COLACE) 100 MG capsule Take 200 mg by mouth daily.     . ferrous sulfate 325 (65 FE) MG tablet Take 325 mg by mouth daily with breakfast.    . ondansetron (ZOFRAN) 4 MG tablet Take 4 mg by mouth every 6 (six) hours as needed for nausea or vomiting.    .  OXYGEN Inhale into the lungs. 4 liters/min    . pantoprazole (PROTONIX) 40 MG tablet TAKE ONE TABLET BY MOUTH TWICE A DAY 60 tablet 5  . predniSONE (DELTASONE) 10 MG tablet Take 1 tablet (10 mg total) by mouth daily with breakfast. 30 tablet 0  . PROAIR HFA 108 (90 Base) MCG/ACT inhaler INHALE TWO PUFFS EVERY FOUR HOURS AS NEEDED (Patient taking differently: INHALE TWO PUFFS EVERY FOUR HOURS AS NEEDED FOR SHORTNESS OF BREATH/WHEEZING) 8.5 g 5  . simvastatin (ZOCOR) 40 MG tablet Take 40 mg by mouth every evening.     . Tiotropium Bromide-Olodaterol (STIOLTO RESPIMAT) 2.5-2.5 MCG/ACT AERS Inhale 2 puffs into the lungs daily.    . traZODone (DESYREL) 150 MG tablet Take 200 mg by mouth at bedtime.     Marland Kitchen oxyCODONE (OXY IR/ROXICODONE) 5 MG immediate release tablet Take 5 mg by mouth every 12 (twelve) hours.    . polyethylene glycol-electrolytes (TRILYTE) 420 g solution Take 4,000 mLs by mouth as directed. (Patient not taking: Reported on 11/22/2016) 4000 mL 0   No current facility-administered medications for this visit.     Allergies as of 11/22/2016 - Review Complete 11/22/2016  Allergen Reaction Noted  . Advil [ibuprofen] Diarrhea 07/20/2014  . Aleve [naproxen sodium] Nausea And Vomiting 04/23/2011    Past Medical History:  Diagnosis Date  . Cervical pain 11/21/2012  . COPD (chronic obstructive pulmonary  disease) ([place])   . DDD (degenerative disc disease)   . Fracture of multiple ribs 11/01/2012  . Fracture of occipital condyle ([place]) 11/21/2012  . Headache   . Hyperlipidemia   . Hypertension   . Inguinal hernia 12/19/2013  . Microcytic anemia    Noted August 2017  . Shortness of breath dyspnea     Past Surgical History:  Procedure Laterality Date  . BACK SURGERY    . COLONOSCOPY WITH PROPOFOL N/A 06/15/2016   RMR: inadequate bowel prep all mucosal surfaces not well seen. 5 mm tubular adenoma removed from the hepatic flexure. 3 mm polyp from the rectum was hyperplastic. He had scattered  diverticula  . ESOPHAGOGASTRODUODENOSCOPY (EGD) WITH PROPOFOL N/A 06/15/2016   Dr. Gala Romney: Large hiatal hernia, reflux esophagitis, Schatzki ring with focal area of ulceration, status post dilation with 22F Maloney dilator with moderate improvement in luminal narrowing.  . INGUINAL HERNIA REPAIR Left 07/24/2014   Procedure: LAPAROSCOPIC LEFT INGUINAL HERNIA REPAIR ;  Surgeon: Ralene Ok, MD;  Location: [facility];  Service: General;  Laterality: Left;  . INSERTION OF MESH Left 07/24/2014   Procedure: INSERTION OF MESH;  Surgeon: Ralene Ok, MD;  Location: [facility];  Service: General;  Laterality: Left;  . LUMBAR FUSION    . MALONEY DILATION N/A 06/15/2016   Procedure: Venia Minks DILATION;  Surgeon: Daneil Dolin, MD;  [facility];  Service: Endoscopy;  Laterality: N/A;  . POLYPECTOMY  06/15/2016   Procedure: POLYPECTOMY;  Surgeon: Daneil Dolin, MD;  [facility];  Service: Endoscopy;;  colon  . right lung surgery Right     Family History  Problem Relation Age of Onset  . Diabetes Mother   . Colon cancer Neg Hx   . Liver disease Neg Hx     Social History   Social History  . Marital status: Single    Spouse name: N/A  . Number of children: N/A  . Years of education: N/A   Occupational History  . Not on file.   Social History Main Topics  . Smoking status: Current Some Day Smoker    Packs/day: 0.25    Years: 37.00    Types: Cigarettes  . Smokeless tobacco: Never Used  . Alcohol use No     Comment: denied 11/22/16  . Drug use: No     Comment: former use, none in years. cocaine and marijuana in the past  . Sexual activity: Yes    Birth control/ protection: None   Other Topics Concern  . Not on file   Social History Narrative  . No narrative on file      ROS:  General: Negative for anorexia, weight loss, fever, chills, fatigue, weakness. Eyes: Negative for vision changes.  ENT: Negative for hoarseness, difficulty swallowing , nasal  congestion. CV: Negative for chest pain, angina, palpitations, dyspnea on exertion, peripheral edema.  Respiratory: Negative for dyspnea at rest, dyspnea on exertion, cough, sputum, wheezing.  GI: See history of present illness. GU:  Negative for dysuria, hematuria, urinary incontinence, urinary frequency, nocturnal urination.  MS: Negative for joint pain, low back pain.  Derm: Negative for rash or itching.  Neuro: Negative for weakness, abnormal sensation, seizure, frequent headaches, memory loss, confusion.  Psych: Negative for anxiety, depression, suicidal ideation, hallucinations.  Endo: Negative for unusual weight change.  Heme: Negative for bruising or bleeding. Allergy: Negative for rash or hives.    Physical Examination:  BP 120/84   Pulse (!) 107  Temp (!) 96.1 F (35.6 C) (Axillary)   Ht 6\' 3"  (1.905 m)   Wt 186 lb 3.2 oz (84.5 kg)   BMI 23.27 kg/m    General: Well-nourished, well-developed in no acute distress.  Head: Normocephalic, atraumatic.   Eyes: Conjunctiva pink, no icterus. Mouth: Oropharyngeal mucosa moist and pink , no lesions erythema or exudate. Neck: Supple without thyromegaly, masses, or lymphadenopathy.  Lungs: Clear to auscultation bilaterally.  Heart: Regular rate and rhythm, no murmurs rubs or gallops.  Abdomen: Bowel sounds are normal, nontender, nondistended, no hepatosplenomegaly or masses, no abdominal bruits or    hernia , no rebound or guarding.   Rectal: deferred Extremities: No lower extremity edema. No clubbing or deformities.  Neuro: Alert and oriented x 4 , grossly normal neurologically.  Skin: Warm and dry, no rash or jaundice.   Psych: Alert and cooperative, normal mood and affect.  Labs: Lab Results  Component Value Date   WBC 16.5 (H) 11/02/2016   HGB 14.8 11/02/2016   HCT 43.9 11/02/2016   MCV 87.8 11/02/2016   PLT 196 11/02/2016   Lab Results  Component Value Date   CREATININE 0.89 11/02/2016   BUN 18 11/02/2016   NA  127 (L) 11/02/2016   K 3.7 11/02/2016   CL 85 (L) 11/02/2016   CO2 32 11/02/2016   Lab Results  Component Value Date   ALT 12 (L) 10/21/2016   AST 15 10/21/2016   ALKPHOS 49 10/21/2016   BILITOT 0.7 10/21/2016   Lab Results  Component Value Date   IRON 149 09/13/2016   TIBC 356 09/13/2016   FERRITIN 15 (L) 09/13/2016     Imaging Studies: Dg Chest 2 View  Result Date: 11/01/2016 CLINICAL DATA:  Fever.  Acute on chronic respiratory failure. EXAM: CHEST  2 VIEW COMPARISON:  10/29/2016 FINDINGS: Increased bronchial thickening from prior exam. No confluent airspace disease. Unchanged heart size and mediastinal contours. Extensive posttraumatic deformity of the left chest wall with multiple old rib fractures. No pleural fluid or pneumothorax. IMPRESSION: Progressive bronchial thickening consistent with acute bronchitis/ COPD exacerbation. No focal consolidation. Electronically Signed   By: Jeb Levering M.D.   On: 11/01/2016 21:06   Dg Chest Port 1 View  Result Date: 10/29/2016 CLINICAL DATA:  Dyspnea EXAM: PORTABLE CHEST 1 VIEW COMPARISON:  10/20/2016 chest radiograph. FINDINGS: Stable cardiomediastinal silhouette with normal heart size. No pneumothorax. No pleural effusion. No pulmonary edema. No acute consolidative airspace disease. Stable deformities in multiple left ribs. IMPRESSION: No active disease. Electronically Signed   By: Ilona Sorrel M.D.   On: 10/29/2016 23:18

## 2016-11-22 NOTE — Progress Notes (Signed)
CC'ED TO PCP 

## 2016-11-22 NOTE — Assessment & Plan Note (Signed)
History of iron deficiency anemia, hemoglobin has improved since last year, ferritin had declined back in February last checked. Patient does not want to pursue any labs at this time. Hemoglobin is up to date. Had recommended repeat colonoscopy due to bowel prep not adequate. Patient has significant lung disease. Plan for deep sedation in the OR given polypharmacy and lung disease.  I have discussed the risks, alternatives, benefits with regards to but not limited to the risk of reaction to medication, bleeding, infection, perforation and the patient is agreeable to proceed. Written consent to be obtained.  Will run this by Dr. Gala Romney to make sure in agreement with plan given his significant lung disease.

## 2016-11-22 NOTE — Patient Instructions (Signed)
1. Colonoscopy as scheduled. See separate instructions.  

## 2016-12-07 NOTE — Progress Notes (Signed)
Discussed with Dr. Gala Romney. He agrees with plan for colonoscopy.

## 2016-12-08 ENCOUNTER — Telehealth: Payer: Self-pay | Admitting: Emergency Medicine

## 2016-12-08 MED ORDER — UMECLIDINIUM-VILANTEROL 62.5-25 MCG/INH IN AEPB: 1.0000 | INHALATION_SPRAY | Freq: Every day | RESPIRATORY_TRACT | 4 refills | Status: DC

## 2016-12-08 NOTE — Telephone Encounter (Signed)
Spoke with pt's sister Zora. She stated that the patient needed a refill on his Anoro. Reviewed the last AVS from Dr. Lamonte Sakai and noted that he wanted the patient to be on Anoro. Will send in a RX for Anoro. Sister is aware. Nothing else needed.

## 2016-12-13 ENCOUNTER — Telehealth: Payer: Self-pay

## 2016-12-13 ENCOUNTER — Other Ambulatory Visit (HOSPITAL_COMMUNITY)
Admission: RE | Admit: 2016-12-13 | Discharge: 2016-12-13 | Disposition: A | Discharge status: Home / Self Care | Payer: Medicaid Other | Source: Ambulatory Visit | Attending: Internal Medicine | Admitting: Internal Medicine

## 2016-12-13 ENCOUNTER — Other Ambulatory Visit: Payer: Self-pay

## 2016-12-13 DIAGNOSIS — K625 Hemorrhage of anus and rectum: Secondary | ICD-10-CM | POA: Insufficient documentation

## 2016-12-13 LAB — CBC WITH DIFFERENTIAL/PLATELET
BASOS ABS: 0 10*3/uL (ref 0.0–0.1)
Basophils Relative: 0 %
Eosinophils Absolute: 0 10*3/uL (ref 0.0–0.7)
Eosinophils Relative: 0 %
HEMATOCRIT: 42.3 % (ref 39.0–52.0)
Hemoglobin: 14 g/dL (ref 13.0–17.0)
Lymphocytes Relative: 29 %
Lymphs Abs: 4.2 10*3/uL — ABNORMAL HIGH (ref 0.7–4.0)
MCH: 29.6 pg (ref 26.0–34.0)
MCHC: 33.1 g/dL (ref 30.0–36.0)
MCV: 89.4 fL (ref 78.0–100.0)
Monocytes Absolute: 0.7 10*3/uL (ref 0.1–1.0)
Monocytes Relative: 5 %
NEUTROS ABS: 9.4 10*3/uL — AB (ref 1.7–7.7)
Neutrophils Relative %: 66 %
PLATELETS: 329 10*3/uL (ref 150–400)
RBC: 4.73 MIL/uL (ref 4.22–5.81)
RDW: 15.2 % (ref 11.5–15.5)
WBC: 14.4 10*3/uL — AB (ref 4.0–10.5)

## 2016-12-13 NOTE — Telephone Encounter (Signed)
Pt is aware and he will go have the blood work done now. Order has been faxed

## 2016-12-13 NOTE — Telephone Encounter (Addendum)
Labs were done under RMR.   Lab Results  Component Value Date   WBC 14.4 (H) 12/13/2016   HGB 14.0 12/13/2016   HCT 42.3 12/13/2016   MCV 89.4 12/13/2016   PLT 329 12/13/2016    WBC improved. H/H stable and normal.   Hold iron to see if "black" stools clear up. Also make sure he is not taking anything that would turn stool black like pepto.   Is there an earlier date for colonoscopy? Not urgent at this point given normal Hgb.

## 2016-12-13 NOTE — Telephone Encounter (Signed)
He needs STAT CBC for black stools. Question if true melena in setting of iron use. If significant change in Hgb, then may require repeat EGD as well.  If patient is feeling lightheaded, dizzy, worsening SOB from baseline, then go to ED for black stools.

## 2016-12-13 NOTE — Telephone Encounter (Signed)
Pt came by the office to see about maybe moving up his TCS . He is have very black stool for the past couple of weeks. He is set up for 01/02/17. He has not changed any medications. Please advise

## 2016-12-14 NOTE — Telephone Encounter (Signed)
Pt is aware to stop the iron and see how his stools are then

## 2016-12-18 NOTE — Telephone Encounter (Signed)
Let's call patient and find out if stools are still black.

## 2016-12-18 NOTE — Telephone Encounter (Signed)
Stools are back to normal looking and not black since stopping the iron

## 2016-12-19 ENCOUNTER — Ambulatory Visit (INDEPENDENT_AMBULATORY_CARE_PROVIDER_SITE_OTHER): Payer: Medicaid Other | Admitting: Emergency Medicine

## 2016-12-19 ENCOUNTER — Encounter: Payer: Self-pay | Admitting: Emergency Medicine

## 2016-12-19 DIAGNOSIS — J9611 Chronic respiratory failure with hypoxia: Secondary | ICD-10-CM

## 2016-12-19 DIAGNOSIS — J438 Other emphysema: Secondary | ICD-10-CM | POA: Diagnosis not present

## 2016-12-19 DIAGNOSIS — F17200 Nicotine dependence, unspecified, uncomplicated: Secondary | ICD-10-CM

## 2016-12-19 MED ORDER — PREDNISONE 10 MG PO TABS: 10.0000 mg | ORAL_TABLET | Freq: Every day | ORAL | 3 refills | Status: DC

## 2016-12-19 NOTE — Assessment & Plan Note (Signed)
He has significant benefited from the addition of chronic low-dose prednisone. We will continue this as below.  Please continue Anoro once a day Continue albuterol as needed Continue oxygen at 4 L/m at all times Continue prednisone 10 mg daily. We will refill this today We will discuss possibly decreasing your prednisone dose at your next visit.

## 2016-12-19 NOTE — Assessment & Plan Note (Signed)
Discussed cessation with him today. He continues to smoke 3-4 cigarettes daily. He understands the importance of stopping. He will work on decreasing by our next visit and then we will try to set a quit date

## 2016-12-19 NOTE — Assessment & Plan Note (Signed)
Requiring oxygen 4 L/m. Continue same dosing

## 2016-12-19 NOTE — Addendum Note (Signed)
Addended by: Benson Setting L on: 12/19/2016 02:47 PM   Modules accepted: Orders
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
## 2016-12-19 NOTE — Patient Instructions (Addendum)
Please continue Anoro once a day Continue albuterol as needed Continue oxygen at 4 L/m at all times Continue prednisone 10 mg daily. We will refill this today We will discuss possibly decreasing your prednisone dose at your next visit. Follow with Dr Lamonte Sakai in 4 months or sooner if you have any problems.

## 2016-12-20 NOTE — Telephone Encounter (Signed)
Great, thanks. TCS as planned.

## 2016-12-21 ENCOUNTER — Other Ambulatory Visit: Payer: Self-pay

## 2016-12-21 DIAGNOSIS — D509 Iron deficiency anemia, unspecified: Secondary | ICD-10-CM

## 2016-12-22 NOTE — Patient Instructions (Signed)
MARCY BOGOSIAN  12/22/2016     @PREFPERIOPPHARMACY @   Your procedure is scheduled on  01/02/2017.  Report to [facility] at  700  A.M.  Call this number if you have problems the morning of surgery:  [phone]   Remember:  Do not eat food or drink liquids after midnight.  Take these medicines the morning of surgery with A SIP OF WATER  Zofran, oxycodone, protonix, prednisone. Use your nebulizer and your inhalers before you come.   Do not wear jewelry, make-up or nail polish.  Do not wear lotions, powders, or perfumes, or deoderant.  Do not shave 48 hours prior to surgery.  Men may shave face and neck.  Do not bring valuables to the hospital.  [facility] is not responsible for any belongings or valuables.  Contacts, dentures or bridgework may not be worn into surgery.  Leave your suitcase in the car.  After surgery it may be brought to your room.  For patients admitted to the hospital, discharge time will be determined by your treatment team.  Patients discharged the day of surgery will not be allowed to drive home.   Name and phone number of your driver:   family Special instructions:  Follow the diet and prep instructions given to you by Dr Roseanne Kaufman office.  Please read over the following fact sheets that you were given. Anesthesia Post-op Instructions and Care and Recovery After Surgery       Colonoscopy, Adult A colonoscopy is an exam to look at the entire large intestine. During the exam, a lubricated, bendable tube is inserted into the anus and then passed into the rectum, colon, and other parts of the large intestine. A colonoscopy is often done as a part of normal colorectal screening or in response to certain symptoms, such as anemia, persistent diarrhea, abdominal pain, and blood in the stool. The exam can help screen for and diagnose medical problems, including:  Tumors.  Polyps.  Inflammation.  Areas of bleeding.  Tell a health care  provider about:  Any allergies you have.  All medicines you are taking, including vitamins, herbs, eye drops, creams, and over-the-counter medicines.  Any problems you or family members have had with anesthetic medicines.  Any blood disorders you have.  Any surgeries you have had.  Any medical conditions you have.  Any problems you have had passing stool. What are the risks? Generally, this is a safe procedure. However, problems may occur, including:  Bleeding.  A tear in the intestine.  A reaction to medicines given during the exam.  Infection (rare).  What happens before the procedure? Eating and drinking restrictions Follow instructions from your health care provider about eating and drinking, which may include:  A few days before the procedure - follow a low-fiber diet. Avoid nuts, seeds, dried fruit, raw fruits, and vegetables.  1-3 days before the procedure - follow a clear liquid diet. Drink only clear liquids, such as clear broth or bouillon, black coffee or tea, clear juice, clear soft drinks or sports drinks, gelatin dessert, and popsicles. Avoid any liquids that contain red or purple dye.  On the day of the procedure - do not eat or drink anything during the 2 hours before the procedure, or within the time period that your health care provider recommends.  Bowel prep If you were prescribed an oral bowel prep to clean out your colon:  Take it as told by your  health care provider. Starting the day before your procedure, you will need to drink a large amount of medicated liquid. The liquid will cause you to have multiple loose stools until your stool is almost clear or light green.  If your skin or anus gets irritated from diarrhea, you may use these to relieve the irritation: ? Medicated wipes, such as adult wet wipes with aloe and vitamin E. ? A skin soothing-product like petroleum jelly.  If you vomit while drinking the bowel prep, take a break for up to 60  minutes and then begin the bowel prep again. If vomiting continues and you cannot take the bowel prep without vomiting, call your health care provider.  General instructions  Ask your health care provider about changing or stopping your regular medicines. This is especially important if you are taking diabetes medicines or blood thinners.  Plan to have someone take you home from the hospital or clinic. What happens during the procedure?  An IV tube may be inserted into one of your veins.  You will be given medicine to help you relax (sedative).  To reduce your risk of infection: ? Your health care team will wash or sanitize their hands. ? Your anal area will be washed with soap.  You will be asked to lie on your side with your knees bent.  Your health care provider will lubricate a long, thin, flexible tube. The tube will have a camera and a light on the end.  The tube will be inserted into your anus.  The tube will be gently eased through your rectum and colon.  Air will be delivered into your colon to keep it open. You may feel some pressure or cramping.  The camera will be used to take images during the procedure.  A small tissue sample may be removed from your body to be examined under a microscope (biopsy). If any potential problems are found, the tissue will be sent to a lab for testing.  If small polyps are found, your health care provider may remove them and have them checked for cancer cells.  The tube that was inserted into your anus will be slowly removed. The procedure may vary among health care providers and hospitals. What happens after the procedure?  Your blood pressure, heart rate, breathing rate, and blood oxygen level will be monitored until the medicines you were given have worn off.  Do not drive for 24 hours after the exam.  You may have a small amount of blood in your stool.  You may pass gas and have mild abdominal cramping or bloating due to the air  that was used to inflate your colon during the exam.  It is up to you to get the results of your procedure. Ask your health care provider, or the department performing the procedure, when your results will be ready. This information is not intended to replace advice given to you by your health care provider. Make sure you discuss any questions you have with your health care provider. Document Released: 06/30/2000 Document Revised: 05/03/2016 Document Reviewed: 09/14/2015 Elsevier Interactive Patient Education  2018 [employer].  Colonoscopy, Adult, Care After This sheet gives you information about how to care for yourself after your procedure. Your health care provider may also give you more specific instructions. If you have problems or questions, contact your health care provider. What can I expect after the procedure? After the procedure, it is common to have:  A small amount of blood in  your stool for 24 hours after the procedure.  Some gas.  Mild abdominal cramping or bloating.  Follow these instructions at home: General instructions   For the first 24 hours after the procedure: ? Do not drive or use machinery. ? Do not sign important documents. ? Do not drink alcohol. ? Do your regular daily activities at a slower pace than normal. ? Eat soft, easy-to-digest foods. ? Rest often.  Take over-the-counter or prescription medicines only as told by your health care provider.  It is up to you to get the results of your procedure. Ask your health care provider, or the department performing the procedure, when your results will be ready. Relieving cramping and bloating  Try walking around when you have cramps or feel bloated.  Apply heat to your abdomen as told by your health care provider. Use a heat source that your health care provider recommends, such as a moist heat pack or a heating pad. ? Place a towel between your skin and the heat source. ? Leave the heat on for 20-30  minutes. ? Remove the heat if your skin turns bright red. This is especially important if you are unable to feel pain, heat, or cold. You may have a greater risk of getting burned. Eating and drinking  Drink enough fluid to keep your urine clear or pale yellow.  Resume your normal diet as instructed by your health care provider. Avoid heavy or fried foods that are hard to digest.  Avoid drinking alcohol for as long as instructed by your health care provider. Contact a health care provider if:  You have blood in your stool 2-3 days after the procedure. Get help right away if:  You have more than a small spotting of blood in your stool.  You pass large blood clots in your stool.  Your abdomen is swollen.  You have nausea or vomiting.  You have a fever.  You have increasing abdominal pain that is not relieved with medicine. This information is not intended to replace advice given to you by your health care provider. Make sure you discuss any questions you have with your health care provider. Document Released: 02/15/2004 Document Revised: 03/27/2016 Document Reviewed: 09/14/2015 Elsevier Interactive Patient Education  2018 [place] Anesthesia is a term that refers to techniques, procedures, and medicines that help a person stay safe and comfortable during a medical procedure. Monitored anesthesia care, or sedation, is one type of anesthesia. Your anesthesia specialist may recommend sedation if you will be having a procedure that does not require you to be unconscious, such as:  Cataract surgery.  A dental procedure.  A biopsy.  A colonoscopy.  During the procedure, you may receive a medicine to help you relax (sedative). There are three levels of sedation:  Mild sedation. At this level, you may feel awake and relaxed. You will be able to follow directions.  Moderate sedation. At this level, you will be sleepy. You may not remember the  procedure.  Deep sedation. At this level, you will be asleep. You will not remember the procedure.  The more medicine you are given, the deeper your level of sedation will be. Depending on how you respond to the procedure, the anesthesia specialist may change your level of sedation or the type of anesthesia to fit your needs. An anesthesia specialist will monitor you closely during the procedure. Let your health care provider know about:  Any allergies you have.  All medicines you  are taking, including vitamins, herbs, eye drops, creams, and over-the-counter medicines.  Any use of steroids (by mouth or as a cream).  Any problems you or family members have had with sedatives and anesthetic medicines.  Any blood disorders you have.  Any surgeries you have had.  Any medical conditions you have, such as sleep apnea.  Whether you are pregnant or may be pregnant.  Any use of cigarettes, alcohol, or street drugs. What are the risks? Generally, this is a safe procedure. However, problems may occur, including:  Getting too much medicine (oversedation).  Nausea.  Allergic reaction to medicines.  Trouble breathing. If this happens, a breathing tube may be used to help with breathing. It will be removed when you are awake and breathing on your own.  Heart trouble.  Lung trouble.  Before the procedure Staying hydrated Follow instructions from your health care provider about hydration, which may include:  Up to 2 hours before the procedure - you may continue to drink clear liquids, such as water, clear fruit juice, black coffee, and plain tea.  Eating and drinking restrictions Follow instructions from your health care provider about eating and drinking, which may include:  8 hours before the procedure - stop eating heavy meals or foods such as meat, fried foods, or fatty foods.  6 hours before the procedure - stop eating light meals or foods, such as toast or cereal.  6 hours  before the procedure - stop drinking milk or drinks that contain milk.  2 hours before the procedure - stop drinking clear liquids.  Medicines Ask your health care provider about:  Changing or stopping your regular medicines. This is especially important if you are taking diabetes medicines or blood thinners.  Taking medicines such as aspirin and ibuprofen. These medicines can thin your blood. Do not take these medicines before your procedure if your health care provider instructs you not to.  Tests and exams  You will have a physical exam.  You may have blood tests done to show: ? How well your kidneys and liver are working. ? How well your blood can clot.  General instructions  Plan to have someone take you home from the hospital or clinic.  If you will be going home right after the procedure, plan to have someone with you for 24 hours.  What happens during the procedure?  Your blood pressure, heart rate, breathing, level of pain and overall condition will be monitored.  An IV tube will be inserted into one of your veins.  Your anesthesia specialist will give you medicines as needed to keep you comfortable during the procedure. This may mean changing the level of sedation.  The procedure will be performed. After the procedure  Your blood pressure, heart rate, breathing rate, and blood oxygen level will be monitored until the medicines you were given have worn off.  Do not drive for 24 hours if you received a sedative.  You may: ? Feel sleepy, clumsy, or nauseous. ? Feel forgetful about what happened after the procedure. ? Have a sore throat if you had a breathing tube during the procedure. ? Vomit. This information is not intended to replace advice given to you by your health care provider. Make sure you discuss any questions you have with your health care provider. Document Released: 03/29/2005 Document Revised: 12/10/2015 Document Reviewed: 10/24/2015 Elsevier  Interactive Patient Education  2018 [place], Care After These instructions provide you with information about caring for yourself  after your procedure. Your health care provider may also give you more specific instructions. Your treatment has been planned according to current medical practices, but problems sometimes occur. Call your health care provider if you have any problems or questions after your procedure. What can I expect after the procedure? After your procedure, it is common to:  Feel sleepy for several hours.  Feel clumsy and have poor balance for several hours.  Feel forgetful about what happened after the procedure.  Have poor judgment for several hours.  Feel nauseous or vomit.  Have a sore throat if you had a breathing tube during the procedure.  Follow these instructions at home: For at least 24 hours after the procedure:   Do not: ? Participate in activities in which you could fall or become injured. ? Drive. ? Use heavy machinery. ? Drink alcohol. ? Take sleeping pills or medicines that cause drowsiness. ? Make important decisions or sign legal documents. ? Take care of children on your own.  Rest. Eating and drinking  Follow the diet that is recommended by your health care provider.  If you vomit, drink water, juice, or soup when you can drink without vomiting.  Make sure you have little or no nausea before eating solid foods. General instructions  Have a responsible adult stay with you until you are awake and alert.  Take over-the-counter and prescription medicines only as told by your health care provider.  If you smoke, do not smoke without supervision.  Keep all follow-up visits as told by your health care provider. This is important. Contact a health care provider if:  You keep feeling nauseous or you keep vomiting.  You feel light-headed.  You develop a rash.  You have a fever. Get help right away  if:  You have trouble breathing. This information is not intended to replace advice given to you by your health care provider. Make sure you discuss any questions you have with your health care provider. Document Released: 10/24/2015 Document Revised: 02/23/2016 Document Reviewed: 10/24/2015 Elsevier Interactive Patient Education  [employer].

## 2016-12-28 ENCOUNTER — Encounter (HOSPITAL_COMMUNITY)
Admission: RE | Admit: 2016-12-28 | Discharge: 2016-12-28 | Disposition: A | Discharge status: Home / Self Care | Payer: Medicaid Other | Source: Ambulatory Visit | Attending: Internal Medicine | Admitting: Internal Medicine

## 2016-12-28 ENCOUNTER — Telehealth: Payer: Self-pay | Admitting: Internal Medicine

## 2016-12-28 ENCOUNTER — Telehealth: Payer: Self-pay

## 2016-12-28 ENCOUNTER — Encounter (HOSPITAL_COMMUNITY): Payer: Self-pay

## 2016-12-28 NOTE — Telephone Encounter (Signed)
Pt called to cancel his TCS because he just don't feel like it.

## 2016-12-28 NOTE — Telephone Encounter (Signed)
Pt called to cancel his  Pre op and wants to reschedule his colonoscopy.

## 2016-12-28 NOTE — Telephone Encounter (Signed)
See separate phone note by GF.

## 2017-01-01 NOTE — Telephone Encounter (Signed)
Pt does not want to reschedule at this time

## 2017-01-01 NOTE — Telephone Encounter (Signed)
It is not clear to me but can we double check with patient, it looks like patient called back after initial phone call to reschedule his colonoscopy. SEE OTHER PHONE NOTED SAME DATE.

## 2017-01-02 ENCOUNTER — Ambulatory Visit (HOSPITAL_COMMUNITY): Admission: RE | Admit: 2017-01-02 | Payer: Medicaid Other | Source: Ambulatory Visit | Admitting: Internal Medicine

## 2017-01-02 ENCOUNTER — Encounter (HOSPITAL_COMMUNITY): Admission: RE | Payer: Self-pay | Source: Ambulatory Visit

## 2017-01-02 SURGERY — COLONOSCOPY WITH PROPOFOL
Anesthesia: Monitor Anesthesia Care

## 2017-01-02 NOTE — Telephone Encounter (Signed)
Noted  

## 2017-02-19 ENCOUNTER — Other Ambulatory Visit (HOSPITAL_COMMUNITY): Payer: Self-pay | Admitting: Family Medicine

## 2017-02-19 ENCOUNTER — Ambulatory Visit (HOSPITAL_COMMUNITY)
Admission: RE | Admit: 2017-02-19 | Discharge: 2017-02-19 | Disposition: A | Discharge status: Home / Self Care | Payer: Medicaid Other | Source: Ambulatory Visit | Attending: Family Medicine | Admitting: Family Medicine

## 2017-02-19 DIAGNOSIS — S2242XK Multiple fractures of ribs, left side, subsequent encounter for fracture with nonunion: Secondary | ICD-10-CM | POA: Insufficient documentation

## 2017-02-19 DIAGNOSIS — R52 Pain, unspecified: Secondary | ICD-10-CM

## 2017-02-19 DIAGNOSIS — G8929 Other chronic pain: Secondary | ICD-10-CM | POA: Diagnosis present

## 2017-03-12 ENCOUNTER — Other Ambulatory Visit: Payer: Self-pay | Admitting: Emergency Medicine

## 2017-03-23 ENCOUNTER — Other Ambulatory Visit: Payer: Self-pay | Admitting: Emergency Medicine

## 2017-04-09 ENCOUNTER — Emergency Department (HOSPITAL_COMMUNITY): Payer: Medicaid Other

## 2017-04-09 ENCOUNTER — Encounter (HOSPITAL_COMMUNITY): Payer: Self-pay | Admitting: Emergency Medicine

## 2017-04-09 ENCOUNTER — Emergency Department (HOSPITAL_COMMUNITY)
Admission: EM | Admit: 2017-04-09 | Discharge: 2017-04-09 | Disposition: A | Discharge status: Home / Self Care | Payer: Medicaid Other | Attending: Emergency Medicine | Admitting: Emergency Medicine

## 2017-04-09 DIAGNOSIS — J449 Chronic obstructive pulmonary disease, unspecified: Secondary | ICD-10-CM | POA: Insufficient documentation

## 2017-04-09 DIAGNOSIS — R609 Edema, unspecified: Secondary | ICD-10-CM

## 2017-04-09 DIAGNOSIS — I10 Essential (primary) hypertension: Secondary | ICD-10-CM | POA: Diagnosis not present

## 2017-04-09 DIAGNOSIS — F17210 Nicotine dependence, cigarettes, uncomplicated: Secondary | ICD-10-CM | POA: Insufficient documentation

## 2017-04-09 DIAGNOSIS — S40021A Contusion of right upper arm, initial encounter: Secondary | ICD-10-CM

## 2017-04-09 DIAGNOSIS — Z79899 Other long term (current) drug therapy: Secondary | ICD-10-CM | POA: Diagnosis not present

## 2017-04-09 DIAGNOSIS — Z7982 Long term (current) use of aspirin: Secondary | ICD-10-CM | POA: Insufficient documentation

## 2017-04-09 DIAGNOSIS — R2231 Localized swelling, mass and lump, right upper limb: Secondary | ICD-10-CM | POA: Diagnosis not present

## 2017-04-09 NOTE — Discharge Instructions (Signed)
Apply moist heat or ice to the area(s) of bruising, for 15 minutes at a time, several times per day for the next few days.  Do not fall asleep on a heating or ice pack.  Elevate the area as much as possible. Call your regular medical doctor today to schedule a follow up appointment this week.  Return to the Emergency Department immediately if worsening.

## 2017-04-09 NOTE — ED Triage Notes (Signed)
Patient c/o right arm pain. Large amount of bruising with some swelling noted to right arm. Per patient started while donating plasma Friday. Patient states "My arm started swelling while I was donating plasma and they didn't stop it and remove the needle till plasma donation was complete." Patient reports using ice with no relief. Patient states that numbness in fingers started on Sunday and is progressively getting worse.

## 2017-04-09 NOTE — ED Notes (Signed)
Patient left without signing and discharge papers.

## 2017-04-09 NOTE — ED Provider Notes (Signed)
[facility] DEPT Provider Note   CSN: [id number] Arrival date & time: 04/09/17  1043     History   Chief Complaint Chief Complaint  Patient presents with  . Arm Pain    HPI Sean Thomas is a 56 y.o. male.  HPI  Pt was seen at 1245. Per pt, c/o gradual onset and persistence of constant right arm "bruising" that began on Friday (3 days ago) while donating plasma. Pt states while donating plasma, his arm "began to bruise and swell up."  States he has been placing ice on his arm without improvement. Denies new injury, no focal motor weakness, no fevers, no open wounds.   Past Medical History:  Diagnosis Date  . Cervical pain 11/21/2012  . COPD (chronic obstructive pulmonary disease) ([place])   . DDD (degenerative disc disease)   . Fracture of multiple ribs 11/01/2012  . Fracture of occipital condyle ([place]) 11/21/2012  . Headache   . Hyperlipidemia   . Hypertension   . Inguinal hernia 12/19/2013  . Microcytic anemia    Noted August 2017  . Shortness of breath dyspnea     Patient Active Problem List   Diagnosis Date Noted  . Tobacco use disorder 10/30/2016  . Hypertension   . Hyperlipidemia   . Chronic respiratory failure with hypoxia ([place]) 10/21/2016  . Essential hypertension 10/20/2016  . Esophageal reflux 09/13/2016  . Hx of adenomatous colonic polyps 09/13/2016  . Esophageal dysphagia 05/23/2016  . Abdominal pain, epigastric 05/23/2016  . IDA (iron deficiency anemia) 05/23/2016  . N&V (nausea and vomiting) 05/23/2016  . Acute on chronic respiratory failure with hypoxia ([place]) 02/25/2016  . Elevated blood sugar 02/25/2016  . Chronic pain 02/25/2016  . Malnutrition of moderate degree ([place]) 12/21/2014  . Pneumothorax on right 12/20/2014  . Rib fractures 12/20/2014  . Pneumothorax, right 12/20/2014  . COPD (chronic obstructive pulmonary disease) ([place]) 12/19/2013  . Chest pain 12/19/2013  . Inguinal hernia 12/19/2013  . Fracture of occipital condyle ([place]) 11/21/2012  .  Cervical pain 11/21/2012  . Fracture of multiple ribs 11/01/2012    Past Surgical History:  Procedure Laterality Date  . BACK SURGERY    . COLONOSCOPY WITH PROPOFOL N/A 06/15/2016   RMR: inadequate bowel prep all mucosal surfaces not well seen. 5 mm tubular adenoma removed from the hepatic flexure. 3 mm polyp from the rectum was hyperplastic. He had scattered diverticula  . ESOPHAGOGASTRODUODENOSCOPY (EGD) WITH PROPOFOL N/A 06/15/2016   Dr. Gala Romney: Large hiatal hernia, reflux esophagitis, Schatzki ring with focal area of ulceration, status post dilation with 56F Maloney dilator with moderate improvement in luminal narrowing.  . INGUINAL HERNIA REPAIR Left 07/24/2014   Procedure: LAPAROSCOPIC LEFT INGUINAL HERNIA REPAIR ;  Surgeon: Ralene Ok, MD;  Location: [facility];  Service: General;  Laterality: Left;  . INSERTION OF MESH Left 07/24/2014   Procedure: INSERTION OF MESH;  Surgeon: Ralene Ok, MD;  Location: [facility];  Service: General;  Laterality: Left;  . LUMBAR FUSION    . MALONEY DILATION N/A 06/15/2016   Procedure: Venia Minks DILATION;  Surgeon: Daneil Dolin, MD;  [facility];  Service: Endoscopy;  Laterality: N/A;  . POLYPECTOMY  06/15/2016   Procedure: POLYPECTOMY;  Surgeon: Daneil Dolin, MD;  [facility];  Service: Endoscopy;;  colon  . right lung surgery Right        Home Medications    Prior to Admission medications   Medication Sig Start Date End Date Taking? Authorizing Provider  albuterol (PROVENTIL) (2.5 MG/3ML) 0.083% nebulizer solution Take 3 mLs (2.5 mg total) by nebulization every 6 (six) hours as needed for wheezing or shortness of breath. 10/04/16   Nat Christen, MD  amitriptyline (ELAVIL) 10 MG tablet Take 10 mg by mouth at bedtime.    [provider]  ANORO ELLIPTA 62.5-25 MCG/INH AEPB INHALE 1 PUFF BY MOUTH ONCE DAILY. 03/12/17   Collene Gobble, MD  aspirin EC 81 MG tablet Take 81 mg by mouth daily.    [provider]    benzonatate (TESSALON PERLES) 100 MG capsule Take 1 capsule (100 mg total) by mouth 3 (three) times daily as needed for cough. 10/23/16   Kathie Dike, MD  docusate sodium (COLACE) 100 MG capsule Take 200 mg by mouth daily.     [provider]  ondansetron (ZOFRAN) 4 MG tablet Take 4 mg by mouth every 6 (six) hours as needed for nausea or vomiting.    [provider]  Oxycodone HCl 10 MG TABS Take 10 mg by mouth every 8 (eight) hours.    [provider]  OXYGEN Inhale into the lungs. 4 liters/min    [provider]  pantoprazole (PROTONIX) 40 MG tablet TAKE ONE TABLET BY MOUTH TWICE A DAY 10/12/16   Carlis Stable, NP  predniSONE (DELTASONE) 10 MG tablet TAKE (1) TABLET BY MOUTH ONCE DAILY WITH BREAKFAST. 03/23/17   Byrum, Rose Fillers, MD  PROAIR HFA 108 (90 Base) MCG/ACT inhaler INHALE TWO PUFFS EVERY FOUR HOURS AS NEEDED Patient taking differently: INHALE TWO PUFFS EVERY FOUR HOURS AS NEEDED FOR SHORTNESS OF BREATH/WHEEZING 09/14/16   Collene Gobble, MD  simvastatin (ZOCOR) 40 MG tablet Take 40 mg by mouth every evening.     [provider]    Family History Family History  Problem Relation Age of Onset  . Diabetes Mother   . Colon cancer Neg Hx   . Liver disease Neg Hx     Social History Social History  Substance Use Topics  . Smoking status: Current Some Day Smoker    Packs/day: 0.25    Years: 37.00    Types: Cigarettes  . Smokeless tobacco: Never Used  . Alcohol use No     Comment: denied 11/22/16     Allergies   Advil [ibuprofen] and Aleve [naproxen sodium]   Review of Systems Review of Systems ROS: Statement: All systems negative except as marked or noted in the HPI; Constitutional: Negative for fever and chills. ; ; Eyes: Negative for eye pain, redness and discharge. ; ; ENMT: Negative for ear pain, hoarseness, nasal congestion, sinus pressure and sore throat. ; ; Cardiovascular: Negative for chest pain, palpitations, diaphoresis,  dyspnea and peripheral edema. ; ; Respiratory: Negative for cough, wheezing and stridor. ; ; Gastrointestinal: Negative for nausea, vomiting, diarrhea, abdominal pain, blood in stool, hematemesis, jaundice and rectal bleeding. . ; ; Genitourinary: Negative for dysuria, flank pain and hematuria. ; ; Musculoskeletal: +RUE swelling. Negative for back pain and neck pain. Negative for deformity.; ; Skin: +RUE bruising. Negative for pruritus, rash, abrasions, blisters, and skin lesion.; ; Neuro: Negative for headache, lightheadedness and neck stiffness. Negative for weakness, altered level of consciousness, altered mental status, extremity weakness, paresthesias, involuntary movement, seizure and syncope.       Physical Exam Updated Vital Signs BP (!) 155/96 (BP Location: Left Arm)   Pulse 90   Temp 97.8 F (36.6 C) (Oral)   Resp 18   Ht 6\' 3"  (1.905  m)   Wt 82.1 kg (181 lb)   SpO2 96%   BMI 22.62 kg/m   Physical Exam 1250: Physical examination:  Nursing notes reviewed; Vital signs and O2 SAT reviewed;  Constitutional: Well developed, Well nourished, Well hydrated, In no acute distress; Head:  Normocephalic, atraumatic; Eyes: EOMI, PERRL, No scleral icterus; ENMT: Mouth and pharynx normal, Mucous membranes moist; Neck: Supple, Full range of motion, No lymphadenopathy; Cardiovascular: Regular rate and rhythm, No gallop; Respiratory: Breath sounds clear & equal bilaterally, No wheezes.  Speaking full sentences with ease, Normal respiratory effort/excursion; Chest: Nontender, Movement normal; Abdomen: Soft, Nontender, Nondistended, Normal bowel sounds; Genitourinary: No CVA tenderness; Extremities: Pulses normal, Strong palpable right brachial and radial pulses. RUE muscles compartments soft. NMS intact right hand. +large ecchymosis right distal medial biceps, AC area, proximal medial forearm. No open wounds, no erythema. Medial right AC area has minimal localized edema..; Neuro: AA&Ox3, Major CN grossly  intact.  Speech clear. No gross focal motor or sensory deficits in extremities. Climbs on and off stretcher easily by himself. Gait steady.; Skin: Color normal, Warm, Dry.   ED Treatments / Results  Labs (all labs ordered are listed, but only abnormal results are displayed)   EKG  EKG Interpretation None       Radiology   Procedures Procedures (including critical care time)  Medications Ordered in ED Medications - No data to display   Initial Impression / Assessment and Plan / ED Course  I have reviewed the triage vital signs and the nursing notes.  Pertinent labs & imaging results that were available during my care of the patient were reviewed by me and considered in my medical decision making (see chart for details).  MDM Reviewed: previous chart, nursing note and vitals Interpretation: ultrasound   US Venous Img Upper Uni Right Result Date: 04/09/2017 CLINICAL DATA:  Right upper extremity pain and swelling post plasma donation. EXAM: RIGHT UPPER EXTREMITY VENOUS DOPPLER ULTRASOUND TECHNIQUE: Gray-scale sonography with graded compression, as well as color Doppler and duplex ultrasound were performed to evaluate the upper extremity deep venous system from the level of the subclavian vein and including the jugular, axillary, basilic, radial, ulnar and upper cephalic vein. Spectral Doppler was utilized to evaluate flow at rest and with distal augmentation maneuvers. COMPARISON:  None. FINDINGS: Contralateral Subclavian Vein: Respiratory phasicity is normal and symmetric with the symptomatic side. No evidence of thrombus. Normal compressibility. Internal Jugular Vein: No evidence of thrombus. Normal compressibility, respiratory phasicity and response to augmentation. Subclavian Vein: No evidence of thrombus. Normal compressibility, respiratory phasicity and response to augmentation. Axillary Vein: No evidence of thrombus. Normal compressibility, respiratory phasicity and response to  augmentation. Cephalic Vein: No evidence of thrombus. Normal compressibility, respiratory phasicity and response to augmentation. Basilic Vein: No evidence of thrombus. Normal compressibility, respiratory phasicity and response to augmentation. Brachial Veins: No evidence of thrombus. Normal compressibility, respiratory phasicity and response to augmentation. Radial Veins: No evidence of thrombus. Normal compressibility, respiratory phasicity and response to augmentation. Ulnar Veins: No evidence of thrombus. Normal compressibility, respiratory phasicity and response to augmentation. Venous Reflux:  None visualized. Other Findings:  None visualized. IMPRESSION: No evidence of DVT within the right upper extremity. Electronically Signed   By: Franki Cabot M.D.   On: 04/09/2017 13:40    1355:  Vasc US reassuring. Tx symptomatically at this time. Dx and testing d/w pt.  Questions answered.  Verb understanding, agreeable to d/c home with outpt f/u.   Final Clinical Impressions(s) / ED  Diagnoses   Final diagnoses:  Swelling    New Prescriptions New Prescriptions   No medications on file     Francine Graven, DO 04/13/17 1652

## 2017-04-19 ENCOUNTER — Ambulatory Visit: Payer: Medicaid Other | Admitting: Emergency Medicine

## 2017-04-23 ENCOUNTER — Telehealth: Payer: Self-pay | Admitting: Emergency Medicine

## 2017-04-23 NOTE — Telephone Encounter (Signed)
Pt has tried and failed spiriva and stiolto---PA was called in today to [facility] for the anoro----this was approved x 1 year until 04/2018 and the pharmacy is aware.    PT ID #  [id number]  PA approval #  [id number]

## 2017-05-25 ENCOUNTER — Other Ambulatory Visit: Payer: Self-pay | Admitting: Emergency Medicine

## 2017-06-19 ENCOUNTER — Ambulatory Visit (HOSPITAL_COMMUNITY)
Admission: RE | Admit: 2017-06-19 | Discharge: 2017-06-19 | Disposition: A | Discharge status: Home / Self Care | Payer: Medicaid Other | Source: Ambulatory Visit | Attending: Family Medicine | Admitting: Family Medicine

## 2017-06-19 ENCOUNTER — Other Ambulatory Visit (HOSPITAL_COMMUNITY): Payer: Self-pay | Admitting: Family Medicine

## 2017-06-19 DIAGNOSIS — M545 Low back pain, unspecified: Secondary | ICD-10-CM

## 2017-06-19 DIAGNOSIS — M5136 Other intervertebral disc degeneration, lumbar region: Secondary | ICD-10-CM | POA: Diagnosis not present

## 2017-06-19 DIAGNOSIS — M4808 Spinal stenosis, sacral and sacrococcygeal region: Secondary | ICD-10-CM | POA: Insufficient documentation

## 2017-06-19 DIAGNOSIS — M4807 Spinal stenosis, lumbosacral region: Secondary | ICD-10-CM | POA: Insufficient documentation

## 2017-06-19 DIAGNOSIS — M533 Sacrococcygeal disorders, not elsewhere classified: Secondary | ICD-10-CM | POA: Diagnosis not present

## 2017-06-19 DIAGNOSIS — M48061 Spinal stenosis, lumbar region without neurogenic claudication: Secondary | ICD-10-CM | POA: Diagnosis not present

## 2017-06-19 DIAGNOSIS — G8929 Other chronic pain: Secondary | ICD-10-CM | POA: Insufficient documentation

## 2017-08-24 ENCOUNTER — Other Ambulatory Visit (HOSPITAL_COMMUNITY): Payer: Self-pay | Admitting: Physician Assistant

## 2017-08-24 DIAGNOSIS — M5416 Radiculopathy, lumbar region: Secondary | ICD-10-CM

## 2017-08-29 ENCOUNTER — Ambulatory Visit (HOSPITAL_COMMUNITY)
Admission: RE | Admit: 2017-08-29 | Discharge: 2017-08-29 | Disposition: A | Discharge status: Home / Self Care | Payer: Medicaid Other | Source: Ambulatory Visit | Attending: Physician Assistant | Admitting: Physician Assistant

## 2017-08-29 DIAGNOSIS — M5127 Other intervertebral disc displacement, lumbosacral region: Secondary | ICD-10-CM | POA: Insufficient documentation

## 2017-08-29 DIAGNOSIS — M5416 Radiculopathy, lumbar region: Secondary | ICD-10-CM

## 2017-08-29 DIAGNOSIS — M5116 Intervertebral disc disorders with radiculopathy, lumbar region: Secondary | ICD-10-CM | POA: Insufficient documentation

## 2017-08-29 DIAGNOSIS — M48061 Spinal stenosis, lumbar region without neurogenic claudication: Secondary | ICD-10-CM | POA: Diagnosis not present

## 2017-09-11 ENCOUNTER — Other Ambulatory Visit: Payer: Self-pay | Admitting: Emergency Medicine

## 2017-09-24 ENCOUNTER — Ambulatory Visit: Payer: Medicaid Other | Admitting: Adult Health

## 2017-10-16 ENCOUNTER — Other Ambulatory Visit: Payer: Self-pay | Admitting: Emergency Medicine

## 2017-12-14 ENCOUNTER — Other Ambulatory Visit: Payer: Self-pay | Admitting: Emergency Medicine

## 2018-01-11 ENCOUNTER — Other Ambulatory Visit (HOSPITAL_COMMUNITY): Payer: Self-pay | Admitting: Family Medicine

## 2018-01-11 ENCOUNTER — Ambulatory Visit (HOSPITAL_COMMUNITY)
Admission: RE | Admit: 2018-01-11 | Discharge: 2018-01-11 | Disposition: A | Discharge status: Home / Self Care | Payer: Medicaid Other | Source: Ambulatory Visit | Attending: Family Medicine | Admitting: Family Medicine

## 2018-01-11 DIAGNOSIS — M5030 Other cervical disc degeneration, unspecified cervical region: Secondary | ICD-10-CM

## 2018-01-11 DIAGNOSIS — M542 Cervicalgia: Secondary | ICD-10-CM | POA: Diagnosis present

## 2018-01-11 DIAGNOSIS — M5031 Other cervical disc degeneration,  high cervical region: Secondary | ICD-10-CM | POA: Insufficient documentation

## 2018-01-11 DIAGNOSIS — M25512 Pain in left shoulder: Secondary | ICD-10-CM | POA: Diagnosis not present

## 2018-01-14 ENCOUNTER — Other Ambulatory Visit: Payer: Self-pay | Admitting: Emergency Medicine

## 2018-01-21 ENCOUNTER — Other Ambulatory Visit: Payer: Self-pay | Admitting: Emergency Medicine

## 2018-02-07 ENCOUNTER — Other Ambulatory Visit (HOSPITAL_COMMUNITY): Payer: Self-pay | Admitting: Family Medicine

## 2018-02-07 DIAGNOSIS — M542 Cervicalgia: Secondary | ICD-10-CM

## 2018-02-14 ENCOUNTER — Other Ambulatory Visit: Payer: Self-pay | Admitting: Emergency Medicine

## 2018-02-15 ENCOUNTER — Ambulatory Visit (HOSPITAL_COMMUNITY): Payer: Medicaid Other

## 2018-02-28 ENCOUNTER — Ambulatory Visit: Payer: Medicaid Other | Admitting: Emergency Medicine

## 2018-02-28 ENCOUNTER — Ambulatory Visit (INDEPENDENT_AMBULATORY_CARE_PROVIDER_SITE_OTHER): Payer: Medicaid Other | Admitting: *Deleted

## 2018-02-28 ENCOUNTER — Encounter: Payer: Self-pay | Admitting: Emergency Medicine

## 2018-02-28 DIAGNOSIS — J438 Other emphysema: Secondary | ICD-10-CM | POA: Diagnosis not present

## 2018-02-28 DIAGNOSIS — R0602 Shortness of breath: Secondary | ICD-10-CM

## 2018-02-28 DIAGNOSIS — F17200 Nicotine dependence, unspecified, uncomplicated: Secondary | ICD-10-CM

## 2018-02-28 DIAGNOSIS — Z23 Encounter for immunization: Secondary | ICD-10-CM

## 2018-02-28 DIAGNOSIS — J9611 Chronic respiratory failure with hypoxia: Secondary | ICD-10-CM | POA: Diagnosis not present

## 2018-02-28 MED ORDER — PREDNISONE 10 MG PO TABS: 10.0000 mg | ORAL_TABLET | Freq: Every day | ORAL | 5 refills | Status: DC

## 2018-02-28 NOTE — Assessment & Plan Note (Signed)
Continues to smoke a pack a day.  Discussed the importance of cessation or at least decreasing.  He is in the contemplative stage.
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
## 2018-02-28 NOTE — Assessment & Plan Note (Signed)
He has been titrated in the past to 4 L/min but he did not desaturate with ambulation today.  Unclear whether he still needs oxygen.  I will perform 6-minute walk test to try and elicit a desaturation.  I cannot qualify him for a POC today.

## 2018-02-28 NOTE — Assessment & Plan Note (Signed)
Please continue Anoro 1 inhalation once daily. Keep albuterol available to use 2 puffs if needed for shortness of breath, chest tightness, wheezing. We will restart your prednisone 10 mg daily. You are due for the Prevnar-13 pneumonia shot. You need to get your flu shot in the fall. Most important thing for you to do is to decrease your smoking.  Once you have decreased significantly we can start to talk about setting a quit date, stopping altogether. Follow with Dr Lamonte Sakai in 6 months or sooner if you have any problems

## 2018-02-28 NOTE — Patient Instructions (Addendum)
Please continue Anoro 1 inhalation once daily. Keep albuterol available to use 2 puffs if needed for shortness of breath, chest tightness, wheezing. We will restart your prednisone 10 mg daily. We performed a walking oximetry today, and your oxygen levels did not drop. This suggests that you do not need oxygen with simple light activity. We will perform a 6 minute walk test to see if you drop your levels with more activity.  You are due for the Prevnar-13 pneumonia shot. You need to get your flu shot in the fall. Most important thing for you to do is to decrease your smoking.  Once you have decreased significantly we can start to talk about setting a quit date, stopping altogether. Follow with Dr Lamonte Sakai in 6 months or sooner if you have any problems

## 2018-02-28 NOTE — Progress Notes (Signed)
SIX MIN WALK 02/28/2018 02/28/2018 12/19/2013 12/19/2013  Medications anoro, aleve and gabapentin  - - -  Supplimental Oxygen during Test? (L/min) No No Yes No  O2 Flow Rate - - 2 -  Type - - Continuous -  Laps 9 - - -  Partial Lap (in Meters) 12 - - -  Baseline BP (sitting) 130/80 - - -  Baseline Heartrate 81 - - -  Baseline Dyspnea (Borg Scale) 95 - - -  Baseline Fatigue (Borg Scale) 0 - - -  Baseline SPO2 0 - - -  BP (sitting) 134/84 - - -  Heartrate 95 - - -  Dyspnea (Borg Scale) 98 - - -  Fatigue (Borg Scale) 0 - - -  SPO2 0 - - -  BP (sitting) 130/80 - - -  Heartrate 86 - - -  SPO2 99 - - -  Interpretation Leg pain - - -  Distance Completed 444 - - -

## 2018-04-15 ENCOUNTER — Other Ambulatory Visit: Payer: Self-pay | Admitting: Emergency Medicine

## 2018-05-18 ENCOUNTER — Other Ambulatory Visit: Payer: Self-pay | Admitting: Emergency Medicine

## 2018-07-16 ENCOUNTER — Ambulatory Visit (HOSPITAL_COMMUNITY)
Admission: RE | Admit: 2018-07-16 | Discharge: 2018-07-16 | Disposition: A | Discharge status: Home / Self Care | Payer: Medicaid Other | Source: Ambulatory Visit | Attending: Family Medicine | Admitting: Family Medicine

## 2018-07-16 ENCOUNTER — Other Ambulatory Visit (HOSPITAL_COMMUNITY): Payer: Self-pay | Admitting: Family Medicine

## 2018-07-16 DIAGNOSIS — M25512 Pain in left shoulder: Secondary | ICD-10-CM

## 2018-07-16 DIAGNOSIS — M542 Cervicalgia: Secondary | ICD-10-CM | POA: Diagnosis present

## 2018-07-30 ENCOUNTER — Ambulatory Visit (HOSPITAL_COMMUNITY)
Admission: RE | Admit: 2018-07-30 | Discharge: 2018-07-30 | Disposition: A | Discharge status: Home / Self Care | Payer: Medicaid Other | Source: Ambulatory Visit | Attending: Family Medicine | Admitting: Family Medicine

## 2018-07-30 ENCOUNTER — Ambulatory Visit: Payer: Medicaid Other | Admitting: Orthopaedic Surgery

## 2018-07-30 ENCOUNTER — Encounter: Payer: Self-pay | Admitting: Orthopaedic Surgery

## 2018-07-30 VITALS — BP 126/86 | HR 82 | Ht 75.0 in | Wt 194.0 lb

## 2018-07-30 DIAGNOSIS — M542 Cervicalgia: Secondary | ICD-10-CM | POA: Insufficient documentation

## 2018-07-30 DIAGNOSIS — M25512 Pain in left shoulder: Secondary | ICD-10-CM | POA: Diagnosis not present

## 2018-07-30 DIAGNOSIS — G8929 Other chronic pain: Secondary | ICD-10-CM

## 2018-07-30 NOTE — Progress Notes (Signed)
Subjective:    Patient ID: Sean Thomas, male    DOB: 28-Jun-1961, 58 y.o.   MRN: [id number]  HPI He has had shoulder pain since the end of October when he had a fall and hurt his left shoulder.  He has neck pain as well.  In fact, he had a MRI of the cervical spine today.  His left shoulder has pain more at night and with overhead use.  He has no numbness, no weakness, no swelling, no redness.  He is seeing Dr. Cindie Thomas as his family doctor and is on Naprosyn and oxycodone.  He has tried heat, ice and rubs with no help.   Review of Systems  Constitutional: Positive for activity change.  Respiratory: Positive for shortness of breath. Negative for cough.   Musculoskeletal: Positive for arthralgias and neck pain.  Neurological: Positive for headaches.  All other systems reviewed and are negative.  For Review of Systems, all other systems reviewed and are negative.  The following is a summary of the past history medically, past history surgically, known current medicines, social history and family history.  This information is gathered electronically by the computer from prior information and documentation.  I review this each visit and have found including this information at this point in the chart is beneficial and informative.   Past Medical History:  Diagnosis Date  . Cervical pain 11/21/2012  . COPD (chronic obstructive pulmonary disease) ([place])   . DDD (degenerative disc disease)   . Fracture of multiple ribs 11/01/2012  . Fracture of occipital condyle ([place]) 11/21/2012  . Headache   . Hyperlipidemia   . Hypertension   . Inguinal hernia 12/19/2013  . Microcytic anemia    Noted August 2017  . Shortness of breath dyspnea     Past Surgical History:  Procedure Laterality Date  . BACK SURGERY    . COLONOSCOPY WITH PROPOFOL N/A 06/15/2016   RMR: inadequate bowel prep all mucosal surfaces not well seen. 5 mm tubular adenoma removed from the hepatic flexure. 3 mm polyp from the rectum was  hyperplastic. He had scattered diverticula  . ESOPHAGOGASTRODUODENOSCOPY (EGD) WITH PROPOFOL N/A 06/15/2016   Dr. Gala Romney: Large hiatal hernia, reflux esophagitis, Schatzki ring with focal area of ulceration, status post dilation with 63F Maloney dilator with moderate improvement in luminal narrowing.  . INGUINAL HERNIA REPAIR Left 07/24/2014   Procedure: LAPAROSCOPIC LEFT INGUINAL HERNIA REPAIR ;  Surgeon: Ralene Ok, MD;  Location: [facility];  Service: General;  Laterality: Left;  . INSERTION OF MESH Left 07/24/2014   Procedure: INSERTION OF MESH;  Surgeon: Ralene Ok, MD;  Location: [facility];  Service: General;  Laterality: Left;  . LUMBAR FUSION    . MALONEY DILATION N/A 06/15/2016   Procedure: Venia Minks DILATION;  Surgeon: Daneil Dolin, MD;  [facility];  Service: Endoscopy;  Laterality: N/A;  . POLYPECTOMY  06/15/2016   Procedure: POLYPECTOMY;  Surgeon: Daneil Dolin, MD;  [facility];  Service: Endoscopy;;  colon  . right lung surgery Right     Current Outpatient Medications on File Prior to Visit  Medication Sig Dispense Refill  . albuterol (PROVENTIL) (2.5 MG/3ML) 0.083% nebulizer solution Take 3 mLs (2.5 mg total) by nebulization every 6 (six) hours as needed for wheezing or shortness of breath. 75 mL 3  . ANORO ELLIPTA 62.5-25 MCG/INH AEPB INHALE 1 PUFF BY MOUTH ONCE DAILY. 60 each 2  . docusate sodium (COLACE) 100 MG capsule Take 200 mg  by mouth daily.     . ondansetron (ZOFRAN) 4 MG tablet Take 4 mg by mouth every 6 (six) hours as needed for nausea or vomiting.    . Oxycodone HCl 10 MG TABS Take 10 mg by mouth every 8 (eight) hours.    . OXYGEN Inhale into the lungs. 4 liters/min    . pantoprazole (PROTONIX) 40 MG tablet TAKE ONE TABLET BY MOUTH TWICE A DAY 60 tablet 5  . predniSONE (DELTASONE) 10 MG tablet Take 1 tablet (10 mg total) by mouth daily with breakfast. 30 tablet 5  . PROAIR HFA 108 (90 Base) MCG/ACT inhaler INHALE 2 PUFFS INTO THE LUNGS EVERY 4  HOURS AS NEEDED. 8.5 g 5   No current facility-administered medications on file prior to visit.     Social History   Socioeconomic History  . Marital status: Single    Spouse name: Not on file  . Number of children: Not on file  . Years of education: Not on file  . Highest education level: Not on file  Occupational History  . Not on file  Social Needs  . Financial resource strain: Not on file  . Food insecurity:    Worry: Not on file    Inability: Not on file  . Transportation needs:    Medical: Not on file    Non-medical: Not on file  Tobacco Use  . Smoking status: Current Every Day Smoker    Packs/day: 1.00    Years: 45.00    Pack years: 45.00    Types: Cigarettes  . Smokeless tobacco: Never Used  Substance and Sexual Activity  . Alcohol use: No    Comment: denied 11/22/16  . Drug use: No    Comment: former use, none in years. cocaine and marijuana in the past  . Sexual activity: Yes    Birth control/protection: None  Lifestyle  . Physical activity:    Days per week: Not on file    Minutes per session: Not on file  . Stress: Not on file  Relationships  . Social connections:    Talks on phone: Not on file    Gets together: Not on file    Attends religious service: Not on file    Active member of club or organization: Not on file    Attends meetings of clubs or organizations: Not on file    Relationship status: Not on file  . Intimate partner violence:    Fear of current or ex partner: Not on file    Emotionally abused: Not on file    Physically abused: Not on file    Forced sexual activity: Not on file  Other Topics Concern  . Not on file  Social History Narrative  . Not on file    Family History  Problem Relation Age of Onset  . Diabetes Mother   . Alzheimer's disease Mother   . Diabetes Sister   . Colon cancer Neg Hx   . Liver disease Neg Hx     BP 126/86   Pulse 82   Ht 6\' 3"  (1.905 m)   Wt 194 lb (88 kg)   BMI 24.25 kg/m   Body mass  index is 24.25 kg/m.      Objective:   Physical Exam Constitutional:      Appearance: He is well-developed.  HENT:     Head: Normocephalic and atraumatic.  Eyes:     Conjunctiva/sclera: Conjunctivae normal.     Pupils: Pupils are equal, round,  and reactive to light.  Neck:     Musculoskeletal: Normal range of motion and neck supple.  Cardiovascular:     Rate and Rhythm: Normal rate and regular rhythm.  Pulmonary:     Effort: Pulmonary effort is normal.  Abdominal:     Palpations: Abdomen is soft.  Musculoskeletal:     Left shoulder: He exhibits decreased range of motion and tenderness.       Arms:  Skin:    General: Skin is warm and dry.  Neurological:     Mental Status: He is alert and oriented to person, place, and time.     Cranial Nerves: No cranial nerve deficit.     Motor: No abnormal muscle tone.     Coordination: Coordination normal.     Deep Tendon Reflexes: Reflexes are normal and symmetric. Reflexes normal.  Psychiatric:        Behavior: Behavior normal.        Thought Content: Thought content normal.        Judgment: Judgment normal.      I have reviewed prior x-rays done and notes from Dr. Cindie Thomas.     Assessment & Plan:   Encounter Diagnoses  Name Primary?  . Chronic left shoulder pain Yes  . Neck pain    PROCEDURE NOTE:  The patient request injection, verbal consent was obtained.  The left shoulder was prepped appropriately after time out was performed.   Sterile technique was observed and injection of 1 cc of Depo-Medrol 40 mg with several cc's of plain xylocaine. Anesthesia was provided by ethyl chloride and a 20-gauge needle was used to inject the shoulder area. A posterior approach was used.  The injection was tolerated well.  A band aid dressing was applied.  The patient was advised to apply ice later today and tomorrow to the injection sight as needed.  Continue same medicine.  Return in two weeks.  He may need MRI of the  shoulder on the left.  Call if any problem.  Precautions discussed.   Electronically Signed Sanjuana Kava, MD 1/14/20202:33 PM

## 2018-08-13 ENCOUNTER — Encounter: Payer: Self-pay | Admitting: Orthopaedic Surgery

## 2018-08-13 ENCOUNTER — Ambulatory Visit: Payer: Medicaid Other | Admitting: Orthopaedic Surgery

## 2018-08-13 VITALS — BP 143/88 | HR 99 | Ht 75.0 in | Wt 192.0 lb

## 2018-08-13 DIAGNOSIS — G8929 Other chronic pain: Secondary | ICD-10-CM

## 2018-08-13 DIAGNOSIS — M25512 Pain in left shoulder: Secondary | ICD-10-CM

## 2018-08-13 NOTE — Progress Notes (Signed)
Patient Sean Thomas, male DOB:1960/10/27, 58 y.o. DGL:[id number]  Chief Complaint  Patient presents with  . Shoulder Pain    Left shoulder    HPI  Sean Thomas is a 58 y.o. male who has continued and worsening pain of the left shoulder from his injury in November.  I will get a MRI.  He has limited motion and pain.     Body mass index is 24 kg/m.  ROS  Review of Systems  Constitutional: Positive for activity change.  Respiratory: Positive for shortness of breath. Negative for cough.   Musculoskeletal: Positive for arthralgias and neck pain.  Neurological: Positive for headaches.  All other systems reviewed and are negative.   All other systems reviewed and are negative.  The following is a summary of the past history medically, past history surgically, known current medicines, social history and family history.  This information is gathered electronically by the computer from prior information and documentation.  I review this each visit and have found including this information at this point in the chart is beneficial and informative.    Past Medical History:  Diagnosis Date  . Cervical pain 11/21/2012  . COPD (chronic obstructive pulmonary disease) ([place])   . DDD (degenerative disc disease)   . Fracture of multiple ribs 11/01/2012  . Fracture of occipital condyle ([place]) 11/21/2012  . Headache   . Hyperlipidemia   . Hypertension   . Inguinal hernia 12/19/2013  . Microcytic anemia    Noted August 2017  . Shortness of breath dyspnea     Past Surgical History:  Procedure Laterality Date  . BACK SURGERY    . COLONOSCOPY WITH PROPOFOL N/A 06/15/2016   RMR: inadequate bowel prep all mucosal surfaces not well seen. 5 mm tubular adenoma removed from the hepatic flexure. 3 mm polyp from the rectum was hyperplastic. He had scattered diverticula  . ESOPHAGOGASTRODUODENOSCOPY (EGD) WITH PROPOFOL N/A 06/15/2016   Dr. Gala Romney: Large hiatal hernia, reflux esophagitis, Schatzki ring  with focal area of ulceration, status post dilation with 37F Maloney dilator with moderate improvement in luminal narrowing.  . INGUINAL HERNIA REPAIR Left 07/24/2014   Procedure: LAPAROSCOPIC LEFT INGUINAL HERNIA REPAIR ;  Surgeon: Ralene Ok, MD;  Location: [facility];  Service: General;  Laterality: Left;  . INSERTION OF MESH Left 07/24/2014   Procedure: INSERTION OF MESH;  Surgeon: Ralene Ok, MD;  Location: [facility];  Service: General;  Laterality: Left;  . LUMBAR FUSION    . MALONEY DILATION N/A 06/15/2016   Procedure: Venia Minks DILATION;  Surgeon: Daneil Dolin, MD;  [facility];  Service: Endoscopy;  Laterality: N/A;  . POLYPECTOMY  06/15/2016   Procedure: POLYPECTOMY;  Surgeon: Daneil Dolin, MD;  [facility];  Service: Endoscopy;;  colon  . right lung surgery Right     Family History  Problem Relation Age of Onset  . Diabetes Mother   . Alzheimer's disease Mother   . Diabetes Sister   . Colon cancer Neg Hx   . Liver disease Neg Hx     Social History Social History   Tobacco Use  . Smoking status: Current Every Day Smoker    Packs/day: 1.00    Years: 45.00    Pack years: 45.00    Types: Cigarettes  . Smokeless tobacco: Never Used  Substance Use Topics  . Alcohol use: No    Comment: denied 11/22/16  . Drug use: No    Comment: former use, none in  years. cocaine and marijuana in the past    Allergies  Allergen Reactions  . Advil [Ibuprofen] Diarrhea  . Aleve [Naproxen Sodium] Nausea And Vomiting    diarrhea    Current Outpatient Medications  Medication Sig Dispense Refill  . albuterol (PROVENTIL) (2.5 MG/3ML) 0.083% nebulizer solution Take 3 mLs (2.5 mg total) by nebulization every 6 (six) hours as needed for wheezing or shortness of breath. 75 mL 3  . ANORO ELLIPTA 62.5-25 MCG/INH AEPB INHALE 1 PUFF BY MOUTH ONCE DAILY. 60 each 2  . docusate sodium (COLACE) 100 MG capsule Take 200 mg by mouth daily.     . ondansetron (ZOFRAN) 4 MG tablet  Take 4 mg by mouth every 6 (six) hours as needed for nausea or vomiting.    . Oxycodone HCl 10 MG TABS Take 10 mg by mouth every 8 (eight) hours.    . OXYGEN Inhale into the lungs. 4 liters/min    . pantoprazole (PROTONIX) 40 MG tablet TAKE ONE TABLET BY MOUTH TWICE A DAY 60 tablet 5  . predniSONE (DELTASONE) 10 MG tablet Take 1 tablet (10 mg total) by mouth daily with breakfast. 30 tablet 5  . PROAIR HFA 108 (90 Base) MCG/ACT inhaler INHALE 2 PUFFS INTO THE LUNGS EVERY 4 HOURS AS NEEDED. 8.5 g 5   No current facility-administered medications for this visit.      Physical Exam  Blood pressure (!) 143/88, pulse 99, height 6\' 3"  (1.905 m), weight 192 lb (87.1 kg).  Constitutional: overall normal hygiene, normal nutrition, well developed, normal grooming, normal body habitus. Assistive device:none  Musculoskeletal: gait and station Limp none, muscle tone and strength are normal, no tremors or atrophy is present.  .  Neurological: coordination overall normal.  Deep tendon reflex/nerve stretch intact.  Sensation normal.  Cranial nerves II-XII intact.   Skin:   Normal overall no scars, lesions, ulcers or rashes. No psoriasis.  Psychiatric: Alert and oriented x 3.  Recent memory intact, remote memory unclear.  Normal mood and affect. Well groomed.  Good eye contact.  Cardiovascular: overall no swelling, no varicosities, no edema bilaterally, normal temperatures of the legs and arms, no clubbing, cyanosis and good capillary refill.  Lymphatic: palpation is normal.  Examination of left Upper Extremity is done.  Inspection:   Overall:  Elbow non-tender without crepitus or defects, forearm non-tender without crepitus or defects, wrist non-tender without crepitus or defects, hand non-tender.    Shoulder: with glenohumeral joint tenderness, without effusion.   Upper arm: without swelling and tenderness   Range of motion:   Overall:  Full range of motion of the elbow, full range of motion of  wrist and full range of motion in fingers.   Shoulder:  left  90 degrees forward flexion; 75 degrees abduction; 20 degrees internal rotation, 20 degrees external rotation, 15 degrees extension, 30 degrees adduction.   Stability:   Overall:  Shoulder, elbow and wrist stable   Strength and Tone:   Overall full shoulder muscles strength, full upper arm strength and normal upper arm bulk and tone.  All other systems reviewed and are negative   The patient has been educated about the nature of the problem(s) and counseled on treatment options.  The patient appeared to understand what I have discussed and is in agreement with it.  Encounter Diagnosis  Name Primary?  . Chronic left shoulder pain Yes    PLAN Call if any problems.  Precautions discussed.  Continue current medications.   Return to  clinic MRI left shoulder   Electronically Signed Sanjuana Kava, MD 1/28/20202:58 PM

## 2018-08-27 ENCOUNTER — Ambulatory Visit (HOSPITAL_COMMUNITY)
Admission: RE | Admit: 2018-08-27 | Discharge: 2018-08-27 | Disposition: A | Discharge status: Home / Self Care | Payer: Medicaid Other | Source: Ambulatory Visit | Attending: Orthopaedic Surgery | Admitting: Orthopaedic Surgery

## 2018-08-27 DIAGNOSIS — G8929 Other chronic pain: Secondary | ICD-10-CM | POA: Insufficient documentation

## 2018-08-27 DIAGNOSIS — M25512 Pain in left shoulder: Secondary | ICD-10-CM | POA: Insufficient documentation

## 2018-08-28 ENCOUNTER — Encounter: Payer: Self-pay | Admitting: Orthopaedic Surgery

## 2018-08-28 ENCOUNTER — Ambulatory Visit: Payer: Medicaid Other | Admitting: Orthopaedic Surgery

## 2018-08-28 VITALS — BP 152/94 | HR 93 | Ht 75.0 in | Wt 189.0 lb

## 2018-08-28 DIAGNOSIS — M25512 Pain in left shoulder: Secondary | ICD-10-CM | POA: Diagnosis not present

## 2018-08-28 DIAGNOSIS — G8929 Other chronic pain: Secondary | ICD-10-CM | POA: Diagnosis not present

## 2018-08-28 DIAGNOSIS — F17210 Nicotine dependence, cigarettes, uncomplicated: Secondary | ICD-10-CM

## 2018-08-28 NOTE — Patient Instructions (Signed)
Shoulder Exercises Ask your health care provider which exercises are safe for you. Do exercises exactly as told by your health care provider and adjust them as directed. It is normal to feel mild stretching, pulling, tightness, or discomfort as you do these exercises, but you should stop right away if you feel sudden pain or your pain gets worse.Do not begin these exercises until told by your health care provider. Range of Motion Exercises        These exercises warm up your muscles and joints and improve the movement and flexibility of your shoulder. These exercises also help to relieve pain, numbness, and tingling. These exercises involve stretching your injured shoulder directly. Exercise A: Pendulum 1. Stand near a wall or a surface that you can hold onto for balance. 2. Bend at the waist and let your left / right arm hang straight down. Use your other arm to support you. Keep your back straight and do not lock your knees. 3. Relax your left / right arm and shoulder muscles, and move your hips and your trunk so your left / right arm swings freely. Your arm should swing because of the motion of your body, not because you are using your arm or shoulder muscles. 4. Keep moving your body so your arm swings in the following directions, as told by your health care provider: ? Side to side. ? Forward and backward. ? In clockwise and counterclockwise circles. 5. Continue each motion for __________ seconds, or for as long as told by your health care provider. 6. Slowly return to the starting position. Repeat __________ times. Complete this exercise __________ times a day. Exercise B:Flexion, Standing 1. Stand and hold a broomstick, a cane, or a similar object. Place your hands a little more than shoulder-width apart on the object. Your left / right hand should be palm-up, and your other hand should be palm-down. 2. Keep your elbow straight and keep your shoulder muscles relaxed. Push the stick  down with your healthy arm to raise your left / right arm in front of your body, and then over your head until you feel a stretch in your shoulder. ? Avoid shrugging your shoulder while you raise your arm. Keep your shoulder blade tucked down toward the middle of your back. 3. Hold for __________ seconds. 4. Slowly return to the starting position. Repeat __________ times. Complete this exercise __________ times a day. Exercise C: Abduction, Standing 1. Stand and hold a broomstick, a cane, or a similar object. Place your hands a little more than shoulder-width apart on the object. Your left / right hand should be palm-up, and your other hand should be palm-down. 2. While keeping your elbow straight and your shoulder muscles relaxed, push the stick across your body toward your left / right side. Raise your left / right arm to the side of your body and then over your head until you feel a stretch in your shoulder. ? Do not raise your arm above shoulder height, unless your health care provider tells you to do that. ? Avoid shrugging your shoulder while you raise your arm. Keep your shoulder blade tucked down toward the middle of your back. 3. Hold for __________ seconds. 4. Slowly return to the starting position. Repeat __________ times. Complete this exercise __________ times a day. Exercise D:Internal Rotation 1. Place your left / right hand behind your back, palm-up. 2. Use your other hand to dangle an exercise band, a towel, or a similar object over your shoulder.   Grasp the band with your left / right hand so you are holding onto both ends. 3. Gently pull up on the band until you feel a stretch in the front of your left / right shoulder. ? Avoid shrugging your shoulder while you raise your arm. Keep your shoulder blade tucked down toward the middle of your back. 4. Hold for __________ seconds. 5. Release the stretch by letting go of the band and lowering your hands. Repeat __________ times.  Complete this exercise __________ times a day. Stretching Exercises  These exercises warm up your muscles and joints and improve the movement and flexibility of your shoulder. These exercises also help to relieve pain, numbness, and tingling. These exercises are done using your healthy shoulder to help stretch the muscles of your injured shoulder. Exercise E: Corner Stretch (External Rotation and Abduction) 1. Stand in a doorway with one of your feet slightly in front of the other. This is called a staggered stance. If you cannot reach your forearms to the door frame, stand facing a corner of a room. 2. Choose one of the following positions as told by your health care provider: ? Place your hands and forearms on the door frame above your head. ? Place your hands and forearms on the door frame at the height of your head. ? Place your hands on the door frame at the height of your elbows. 3. Slowly move your weight onto your front foot until you feel a stretch across your chest and in the front of your shoulders. Keep your head and chest upright and keep your abdominal muscles tight. 4. Hold for __________ seconds. 5. To release the stretch, shift your weight to your back foot. Repeat __________ times. Complete this stretch __________ times a day. Exercise F:Extension, Standing 1. Stand and hold a broomstick, a cane, or a similar object behind your back. ? Your hands should be a little wider than shoulder-width apart. ? Your palms should face away from your back. 2. Keeping your elbows straight and keeping your shoulder muscles relaxed, move the stick away from your body until you feel a stretch in your shoulder. ? Avoid shrugging your shoulders while you move the stick. Keep your shoulder blade tucked down toward the middle of your back. 3. Hold for __________ seconds. 4. Slowly return to the starting position. Repeat __________ times. Complete this exercise __________ times a  day. Strengthening Exercises           These exercises build strength and endurance in your shoulder. Endurance is the ability to use your muscles for a long time, even after they get tired. Exercise G:External Rotation 1. Sit in a stable chair without armrests. 2. Secure an exercise band at elbow height on your left / right side. 3. Place a soft object, such as a folded towel or a small pillow, between your left / right upper arm and your body to move your elbow a few inches away (about 10 cm) from your side. 4. Hold the end of the band so it is tight and there is no slack. 5. Keeping your elbow pressed against the soft object, move your left / right forearm out, away from your abdomen. Keep your body steady so only your forearm moves. 6. Hold for __________ seconds. 7. Slowly return to the starting position. Repeat __________ times. Complete this exercise __________ times a day. Exercise H:Shoulder Abduction 1. Sit in a stable chair without armrests, or stand. 2. Hold a __________ weight in your   left / right hand, or hold an exercise band with both hands. 3. Start with your arms straight down and your left / right palm facing in, toward your body. 4. Slowly lift your left / right hand out to your side. Do not lift your hand above shoulder height unless your health care provider tells you that this is safe. ? Keep your arms straight. ? Avoid shrugging your shoulder while you do this movement. Keep your shoulder blade tucked down toward the middle of your back. 5. Hold for __________ seconds. 6. Slowly lower your arm, and return to the starting position. Repeat __________ times. Complete this exercise __________ times a day. Exercise I:Shoulder Extension 1. Sit in a stable chair without armrests, or stand. 2. Secure an exercise band to a stable object in front of you where it is at shoulder height. 3. Hold one end of the exercise band in each hand. Your palms should face each  other. 4. Straighten your elbows and lift your hands up to shoulder height. 5. Step back, away from the secured end of the exercise band, until the band is tight and there is no slack. 6. Squeeze your shoulder blades together as you pull your hands down to the sides of your thighs. Stop when your hands are straight down by your sides. Do not let your hands go behind your body. 7. Hold for __________ seconds. 8. Slowly return to the starting position. Repeat __________ times. Complete this exercise __________ times a day. Exercise J:Standing Shoulder Row 1. Sit in a stable chair without armrests, or stand. 2. Secure an exercise band to a stable object in front of you so it is at waist height. 3. Hold one end of the exercise band in each hand. Your palms should be in a thumbs-up position. 4. Bend each of your elbows to an "L" shape (about 90 degrees) and keep your upper arms at your sides. 5. Step back until the band is tight and there is no slack. 6. Slowly pull your elbows back behind you. 7. Hold for __________ seconds. 8. Slowly return to the starting position. Repeat __________ times. Complete this exercise __________ times a day. Exercise K:Shoulder Press-Ups 1. Sit in a stable chair that has armrests. Sit upright, with your feet flat on the floor. 2. Put your hands on the armrests so your elbows are bent and your fingers are pointing forward. Your hands should be about even with the sides of your body. 3. Push down on the armrests and use your arms to lift yourself off of the chair. Straighten your elbows and lift yourself up as much as you comfortably can. ? Move your shoulder blades down, and avoid letting your shoulders move up toward your ears. ? Keep your feet on the ground. As you get stronger, your feet should support less of your body weight as you lift yourself up. 4. Hold for __________ seconds. 5. Slowly lower yourself back into the chair. Repeat __________ times. Complete  this exercise __________ times a day. Exercise L: Wall Push-Ups 1. Stand so you are facing a stable wall. Your feet should be about one arm-length away from the wall. 2. Lean forward and place your palms on the wall at shoulder height. 3. Keep your feet flat on the floor as you bend your elbows and lean forward toward the wall. 4. Hold for __________ seconds. 5. Straighten your elbows to push yourself back to the starting position. Repeat __________ times. Complete this exercise __________ times   a day. This information is not intended to replace advice given to you by your health care provider. Make sure you discuss any questions you have with your health care provider. Document Released: 05/17/2005 Document Revised: 11/06/2017 Document Reviewed: 03/14/2015 Elsevier Interactive Patient Education  2019 [employer]  

## 2018-08-28 NOTE — Progress Notes (Signed)
Patient Sean Thomas, male DOB:[birth year], 58 y.o. PJA:[id number]  Chief Complaint  Patient presents with  . Shoulder Pain    HPI  Sean Thomas is a 58 y.o. male who has chronic left shoulder pain.  He had a MRI which showed: IMPRESSION: 1. Mild to moderate rotator cuff tendinopathy/tendinosis but no partial or full-thickness tear. 2. Intact long head biceps tendon and glenoid labrum. 3. There is thickening of the capsular structures in the axillary recess which can be seen with adhesive capsulitis or synovitis. 4. No significant findings for bony impingement.  I have explained the findings to him.  He does not need surgery.  He cannot take NSAIDs.  I have given him shoulder exercises to do and also rubber band system to use.  He smokes and is smoking a pack and a half a day.  He had cut back to 3 cigarettes a day but is back up.  He has been smoking for 30+ years.  The patient was counseled about smoking and smoking cessation.  I spent about four  minutes in doing this.  I, of course, determined the patient does smoke and frequency.  The patient smokes  1 1/2 packs a day.  I have advised the patient of problems medically that can occur with continued smoking including poor wound and bone healing as well as the obvious respiratory problems.  I have assessed the patient's willingness to cut back and quit smoking.  The patient has stated he is willing to try to cut back.  I have told the patient to discuss with their family doctor also about smoking cessation.  I am willing to assist my patient in arranging this and to get additional help.I have suggested the patient have a set date to try to begin cutting back.  I have printed out a smoking cessation form about how to begin cutting back and suggestions.  I have recommended starting an immediate plan to cut back.  I will discuss this further when I see the patient in the future.  I have stressed that although this will be very  difficult to accomplish, the benefits are very substantial and will give long term health benefits from now on.  Body mass index is 23.62 kg/m.  ROS  Review of Systems  Constitutional: Positive for activity change.  Respiratory: Positive for shortness of breath. Negative for cough.   Musculoskeletal: Positive for arthralgias and neck pain.  Neurological: Positive for headaches.  All other systems reviewed and are negative.   All other systems reviewed and are negative.  The following is a summary of the past history medically, past history surgically, known current medicines, social history and family history.  This information is gathered electronically by the computer from prior information and documentation.  I review this each visit and have found including this information at this point in the chart is beneficial and informative.    Past Medical History:  Diagnosis Date  . Cervical pain 11/21/2012  . COPD (chronic obstructive pulmonary disease) ([facility])   . DDD (degenerative disc disease)   . Fracture of multiple ribs 11/01/2012  . Fracture of occipital condyle ([facility]) 11/21/2012  . Headache   . Hyperlipidemia   . Hypertension   . Inguinal hernia 12/19/2013  . Microcytic anemia    Noted August 2017  . Shortness of breath dyspnea     Past Surgical History:  Procedure Laterality Date  . BACK SURGERY    . COLONOSCOPY WITH PROPOFOL N/A 06/15/2016  RMR: inadequate bowel prep all mucosal surfaces not well seen. 5 mm tubular adenoma removed from the hepatic flexure. 3 mm polyp from the rectum was hyperplastic. He had scattered diverticula  . ESOPHAGOGASTRODUODENOSCOPY (EGD) WITH PROPOFOL N/A 06/15/2016   Dr. Gala Romney: Large hiatal hernia, reflux esophagitis, Schatzki ring with focal area of ulceration, status post dilation with 46F Maloney dilator with moderate improvement in luminal narrowing.  . INGUINAL HERNIA REPAIR Left 07/24/2014   Procedure: LAPAROSCOPIC LEFT INGUINAL HERNIA REPAIR ;   Surgeon: Ralene Ok, MD;  Location: [facility];  Service: General;  Laterality: Left;  . INSERTION OF MESH Left 07/24/2014   Procedure: INSERTION OF MESH;  Surgeon: Ralene Ok, MD;  Location: [place] [facility];  Service: General;  Laterality: Left;  . LUMBAR FUSION    . MALONEY DILATION N/A 06/15/2016   Procedure: Venia Minks DILATION;  Surgeon: Daneil Dolin, MD;  [facility];  Service: Endoscopy;  Laterality: N/A;  . POLYPECTOMY  06/15/2016   Procedure: POLYPECTOMY;  Surgeon: Daneil Dolin, MD;  Location[facility];  Service: Endoscopy;;  colon  . right lung surgery Right     Family History  Problem Relation Age of Onset  . Diabetes Mother   . Alzheimer's disease Mother   . Diabetes Sister   . Colon cancer Neg Hx   . Liver disease Neg Hx     Social History Social History   Tobacco Use  . Smoking status: Current Every Day Smoker    Packs/day: 1.00    Years: 45.00    Pack years: 45.00    Types: Cigarettes  . Smokeless tobacco: Never Used  Substance Use Topics  . Alcohol use: No    Comment: denied 11/22/16  . Drug use: No    Comment: former use, none in years. cocaine and marijuana in the past    Allergies  Allergen Reactions  . Advil [Ibuprofen] Diarrhea  . Aleve [Naproxen Sodium] Nausea And Vomiting    diarrhea    Current Outpatient Medications  Medication Sig Dispense Refill  . albuterol (PROVENTIL) (2.5 MG/3ML) 0.083% nebulizer solution Take 3 mLs (2.5 mg total) by nebulization every 6 (six) hours as needed for wheezing or shortness of breath. 75 mL 3  . ANORO ELLIPTA 62.5-25 MCG/INH AEPB INHALE 1 PUFF BY MOUTH ONCE DAILY. 60 each 2  . docusate sodium (COLACE) 100 MG capsule Take 200 mg by mouth daily.     . ondansetron (ZOFRAN) 4 MG tablet Take 4 mg by mouth every 6 (six) hours as needed for nausea or vomiting.    . Oxycodone HCl 10 MG TABS Take 10 mg by mouth every 8 (eight) hours.    . OXYGEN Inhale into the lungs. 4 liters/min    . pantoprazole  (PROTONIX) 40 MG tablet TAKE ONE TABLET BY MOUTH TWICE A DAY 60 tablet 5  . predniSONE (DELTASONE) 10 MG tablet Take 1 tablet (10 mg total) by mouth daily with breakfast. 30 tablet 5  . PROAIR HFA 108 (90 Base) MCG/ACT inhaler INHALE 2 PUFFS INTO THE LUNGS EVERY 4 HOURS AS NEEDED. 8.5 g 5   No current facility-administered medications for this visit.      Physical Exam  Blood pressure (!) 152/94, pulse 93, height 6\' 3"  (1.905 m), weight 189 lb (85.7 kg).  Constitutional: overall normal hygiene, normal nutrition, well developed, normal grooming, normal body habitus. Assistive device:none  Musculoskeletal: gait and station Limp none, muscle tone and strength are normal, no tremors or atrophy is  present.  .  Neurological: coordination overall normal.  Deep tendon reflex/nerve stretch intact.  Sensation normal.  Cranial nerves II-XII intact.   Skin:   Normal overall no scars, lesions, ulcers or rashes. No psoriasis.  Psychiatric: Alert and oriented x 3.  Recent memory intact, remote memory unclear.  Normal mood and affect. Well groomed.  Good eye contact.  Cardiovascular: overall no swelling, no varicosities, no edema bilaterally, normal temperatures of the legs and arms, no clubbing, cyanosis and good capillary refill.  Lymphatic: palpation is normal.  He has full ROM of the left shoulder but pain in the extremes.  He has tightness of the left upper trapezius.  NV intact.  ROM of neck is full.  All other systems reviewed and are negative   The patient has been educated about the nature of the problem(s) and counseled on treatment options.  The patient appeared to understand what I have discussed and is in agreement with it.  Encounter Diagnoses  Name Primary?  . Chronic left shoulder pain   . Cigarette nicotine dependence without complication Yes    PLAN Call if any problems.  Precautions discussed.  Continue current medications.   Return to clinic 1 month   Do the  exercises.  Electronically Signed Sanjuana Kava, MD 2/12/202010:12 AM

## 2018-09-19 ENCOUNTER — Other Ambulatory Visit: Payer: Self-pay | Admitting: Emergency Medicine

## 2018-09-25 ENCOUNTER — Other Ambulatory Visit: Payer: Self-pay | Admitting: Emergency Medicine

## 2018-09-25 ENCOUNTER — Encounter: Payer: Self-pay | Admitting: Orthopaedic Surgery

## 2018-09-25 ENCOUNTER — Ambulatory Visit: Payer: Medicaid Other | Admitting: Orthopaedic Surgery

## 2018-10-24 ENCOUNTER — Other Ambulatory Visit: Payer: Self-pay | Admitting: Emergency Medicine

## 2019-01-27 ENCOUNTER — Other Ambulatory Visit: Payer: Self-pay

## 2019-01-27 ENCOUNTER — Ambulatory Visit (INDEPENDENT_AMBULATORY_CARE_PROVIDER_SITE_OTHER): Payer: Medicaid Other | Admitting: Emergency Medicine

## 2019-01-27 ENCOUNTER — Encounter: Payer: Self-pay | Admitting: Emergency Medicine

## 2019-01-27 ENCOUNTER — Telehealth: Payer: Self-pay | Admitting: Emergency Medicine

## 2019-01-27 DIAGNOSIS — J438 Other emphysema: Secondary | ICD-10-CM | POA: Diagnosis not present

## 2019-01-27 NOTE — Addendum Note (Signed)
Addended by: Desmond Dike C on: 01/27/2019 10:02 AM   Modules accepted: Orders
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
## 2019-01-27 NOTE — Telephone Encounter (Signed)
Ok thank you I will put into his note

## 2019-01-27 NOTE — Telephone Encounter (Signed)
Called and spoke with pt. Pt stated that his maintenance inhaler that he takes every day is Trelegy.  Routing to Dr. Lamonte Sakai as an Juluis Rainier.

## 2019-01-27 NOTE — Patient Instructions (Signed)
Please call Sean Thomas with the name of your new maintenance inhaler.  We need this for your records.  We will refill not Anoro at this time Continue prednisone 10 mg once daily every day. Continue your albuterol (ProAir air) 2 puffs up to every 4 hours if needed for shortness of breath, chest tightness, wheezing. We will arrange for pulmonary function testing to assess severity of your COPD.  Based on prior testing, your overall status you are at high risk for general anesthesia or back surgery. Continue your oxygen as you have been using it You need to work hard on decreasing your smoking.  This is the most important thing you can do for your health. Follow with Dr. Lamonte Sakai in 12 months or sooner if you have any problems.

## 2019-01-27 NOTE — Assessment & Plan Note (Signed)
Please call us with the name of your new maintenance inhaler.  We need this for your records.  We will refill not Anoro at this time Continue prednisone 10 mg once daily every day. Continue your albuterol (ProAir air) 2 puffs up to every 4 hours if needed for shortness of breath, chest tightness, wheezing. We will arrange for pulmonary function testing to assess severity of your COPD.  Based on prior testing, your overall status you are at high risk for general anesthesia or back surgery. Continue your oxygen as you have been using it You need to work hard on decreasing your smoking.  This is the most important thing you can do for your health. Follow with Dr. Lamonte Sakai in 12 months or sooner if you have any problems.

## 2019-02-06 ENCOUNTER — Other Ambulatory Visit: Payer: Self-pay | Admitting: Emergency Medicine

## 2019-03-12 ENCOUNTER — Other Ambulatory Visit: Payer: Self-pay | Admitting: Emergency Medicine

## 2019-03-14 ENCOUNTER — Telehealth: Payer: Self-pay | Admitting: Emergency Medicine

## 2019-03-17 NOTE — Telephone Encounter (Signed)
Patient called back - scheduled f/u with qualifying walk on 03/21/2019 -pr

## 2019-03-17 NOTE — Telephone Encounter (Signed)
LMTCB with patient 

## 2019-03-17 NOTE — Telephone Encounter (Signed)
Pt is returning call. Cb is [phone].

## 2019-03-17 NOTE — Telephone Encounter (Signed)
Called [employer] to verify what is needed, as O2 is typically qualified through a qualifying walk and not a 61mw.   Spoke with Tiffany at [place], says that he needs to be recertified on his POC and needs a 24mw to do so.  I advised that we do not qualify pts on a 71mw, but Tiffany was insistent that it was to be a 93mw.  I spoke to team lead Janett Billow to verify that no changes had been made to Medicare guidelines for O2 qualification, and she verified that no changes have been made to this O2 process.  Pt has not been seen in our office in over 30 days, so he will need an OV with a qualifying walk.    lmtcb for pt- we just need to schedule him for an OV with a qualifying walk at that visit.

## 2019-03-17 NOTE — Telephone Encounter (Signed)
Thank you Sharl Ma!! Nothing further needed at this time- will close encounter.

## 2019-03-18 ENCOUNTER — Other Ambulatory Visit: Payer: Self-pay

## 2019-03-18 ENCOUNTER — Other Ambulatory Visit (HOSPITAL_COMMUNITY)
Admission: RE | Admit: 2019-03-18 | Discharge: 2019-03-18 | Disposition: A | Discharge status: Home / Self Care | Payer: Medicaid Other | Source: Ambulatory Visit | Attending: Emergency Medicine | Admitting: Emergency Medicine

## 2019-03-18 DIAGNOSIS — Z01812 Encounter for preprocedural laboratory examination: Secondary | ICD-10-CM | POA: Insufficient documentation

## 2019-03-18 DIAGNOSIS — Z20828 Contact with and (suspected) exposure to other viral communicable diseases: Secondary | ICD-10-CM | POA: Diagnosis not present

## 2019-03-18 LAB — SARS CORONAVIRUS 2 (TAT 6-24 HRS): SARS Coronavirus 2: NEGATIVE

## 2019-03-21 ENCOUNTER — Other Ambulatory Visit: Payer: Self-pay

## 2019-03-21 ENCOUNTER — Encounter: Payer: Self-pay | Admitting: Adult Health

## 2019-03-21 ENCOUNTER — Ambulatory Visit (INDEPENDENT_AMBULATORY_CARE_PROVIDER_SITE_OTHER): Payer: Medicaid Other | Admitting: Emergency Medicine

## 2019-03-21 ENCOUNTER — Ambulatory Visit (INDEPENDENT_AMBULATORY_CARE_PROVIDER_SITE_OTHER): Payer: Medicaid Other

## 2019-03-21 ENCOUNTER — Ambulatory Visit (INDEPENDENT_AMBULATORY_CARE_PROVIDER_SITE_OTHER): Payer: Medicaid Other | Admitting: Adult Health

## 2019-03-21 DIAGNOSIS — Z23 Encounter for immunization: Secondary | ICD-10-CM | POA: Diagnosis not present

## 2019-03-21 DIAGNOSIS — F17200 Nicotine dependence, unspecified, uncomplicated: Secondary | ICD-10-CM

## 2019-03-21 DIAGNOSIS — J438 Other emphysema: Secondary | ICD-10-CM

## 2019-03-21 DIAGNOSIS — J9611 Chronic respiratory failure with hypoxia: Secondary | ICD-10-CM | POA: Diagnosis not present

## 2019-03-21 LAB — PULMONARY FUNCTION TEST
DL/VA % pred: 77 %
DL/VA: 3.23 ml/min/mmHg/L
DLCO unc % pred: 56 %
DLCO unc: 17.71 ml/min/mmHg
FEF 25-75 Post: 2.17 L/sec
FEF 25-75 Pre: 1.57 L/sec
FEF2575-%Change-Post: 37 %
FEF2575-%Pred-Post: 64 %
FEF2575-%Pred-Pre: 46 %
FEV1-%Change-Post: 8 %
FEV1-%Pred-Post: 70 %
FEV1-%Pred-Pre: 64 %
FEV1-Post: 2.91 L
FEV1-Pre: 2.68 L
FEV1FVC-%Change-Post: 1 %
FEV1FVC-%Pred-Pre: 91 %
FEV6-%Change-Post: 6 %
FEV6-%Pred-Post: 77 %
FEV6-%Pred-Pre: 72 %
FEV6-Post: 4.05 L
FEV6-Pre: 3.79 L
FEV6FVC-%Change-Post: 0 %
FEV6FVC-%Pred-Post: 102 %
FEV6FVC-%Pred-Pre: 102 %
FVC-%Change-Post: 6 %
FVC-%Pred-Post: 75 %
FVC-%Pred-Pre: 70 %
FVC-Post: 4.13 L
FVC-Pre: 3.86 L
Post FEV1/FVC ratio: 70 %
Post FEV6/FVC ratio: 98 %
Pre FEV1/FVC ratio: 69 %
Pre FEV6/FVC Ratio: 98 %
RV % pred: 107 %
RV: 2.64 L
TLC % pred: 85 %
TLC: 6.66 L

## 2019-03-21 NOTE — Progress Notes (Signed)
PFT done today. 

## 2019-03-21 NOTE — Addendum Note (Signed)
Addended by: Parke Poisson E on: 03/21/2019 02:27 PM   Modules accepted: Orders

## 2019-03-21 NOTE — Assessment & Plan Note (Signed)
Smoking cessation discussed 

## 2019-03-21 NOTE — Assessment & Plan Note (Signed)
O2 saturations without desat except to 89% on walk test. Due to this patient will not qualify for home oxygen under insurance guidelines.  May discontinue oxygen.

## 2019-03-21 NOTE — Patient Instructions (Addendum)
Continue on TRELEGY 1 puff daily ,rinse after use.  Work on not smoking .  Flu shot today . Chest xray today  Follow up with Dr. Lamonte Sakai  In 1 year and As needed
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
## 2019-03-21 NOTE — Addendum Note (Signed)
Addended by: Parke Poisson E on: 03/21/2019 02:35 PM   Modules accepted: Orders
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
## 2019-03-26 IMAGING — MR MR SHOULDER*L* W/O CM
4 of 5 series · 19 of 40 positions shown · non-contrast
Comparison: Radiographs 07/16/2018

CLINICAL DATA: Left shoulder pain for 3 months.

EXAM:
MRI OF THE LEFT SHOULDER WITHOUT CONTRAST
TECHNIQUE: Multiplanar, multisequence MR imaging of the shoulder was performed.
No intravenous contrast was administered.

[Series 3: pdfs axial · axial · 4.0mm · 0.28mm/px · z∈[-24,+53]mm · 4 of 20 slices shown]
[im 1/20]
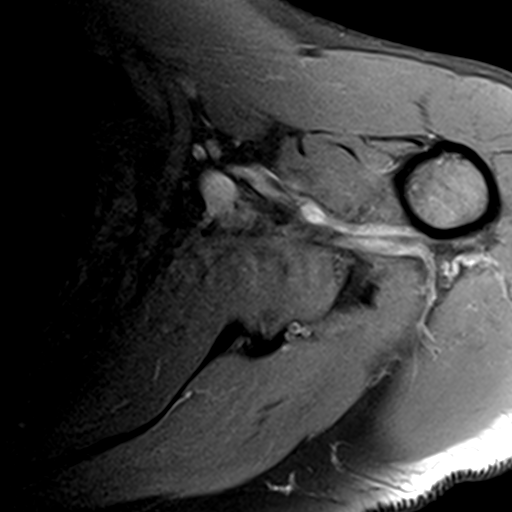
[im 3/20]
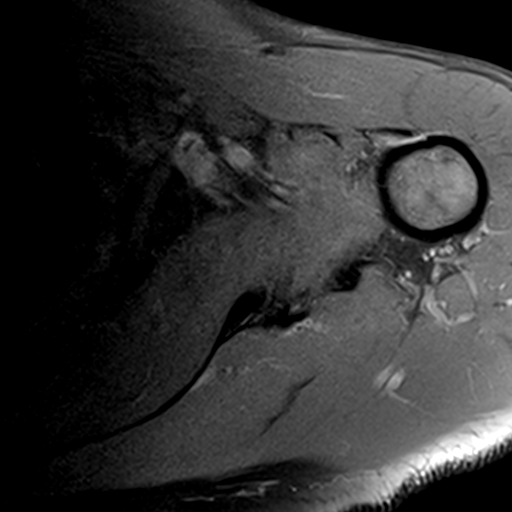
[im 11/20]
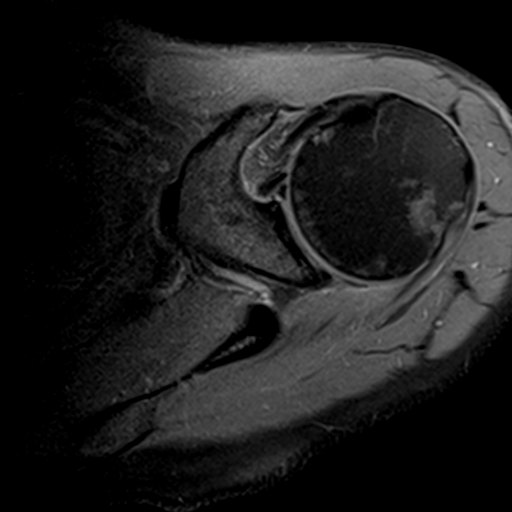
[im 17/20]
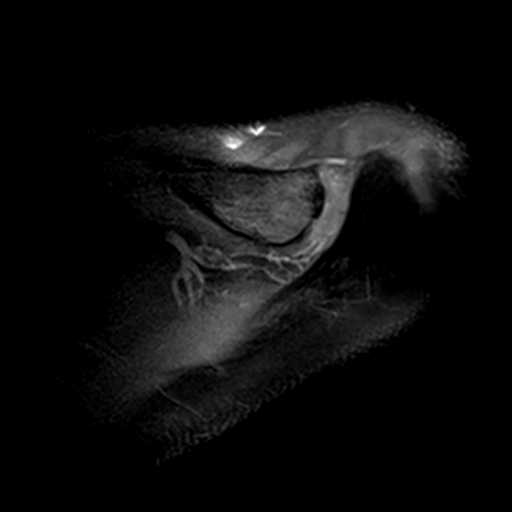

[Series 4: t2fs coronal · oblique · 4.0mm · 0.29mm/px · 3 of 20 slices shown]
[im 4/20]
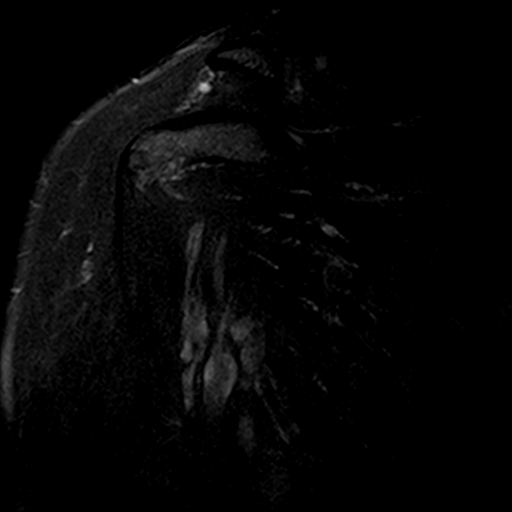
[im 10/20]
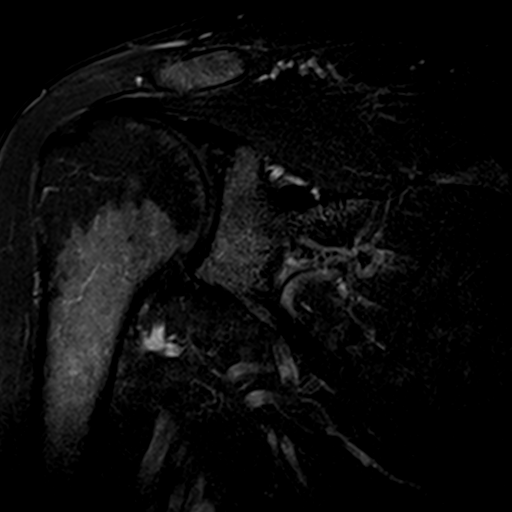
[im 16/20]
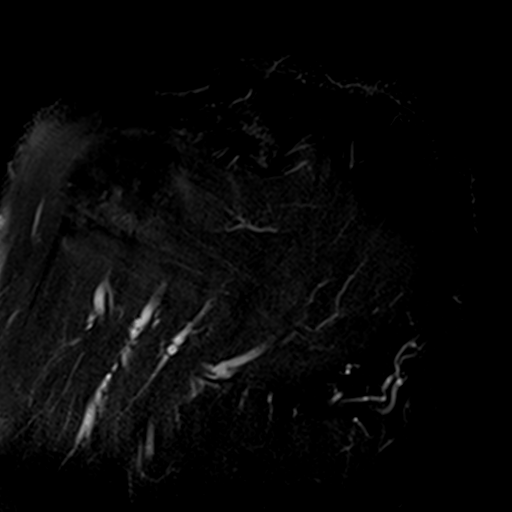

[Series 5: pdfs coronal · oblique · 4.0mm · 0.29mm/px · 3 of 20 slices shown]
[im 4/20]
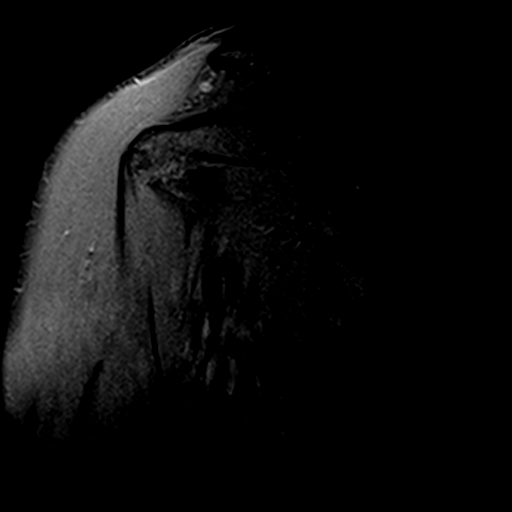
[im 10/20]
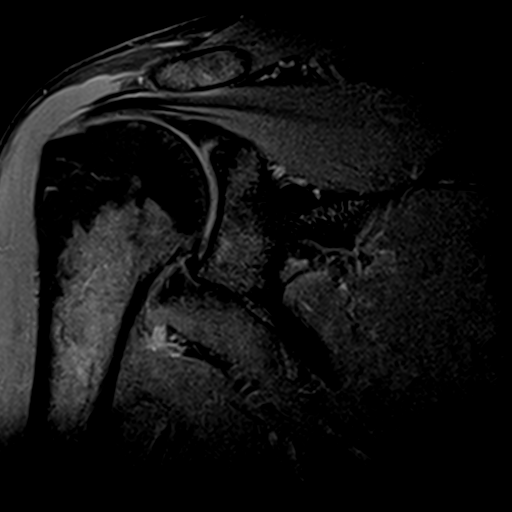
[im 16/20]
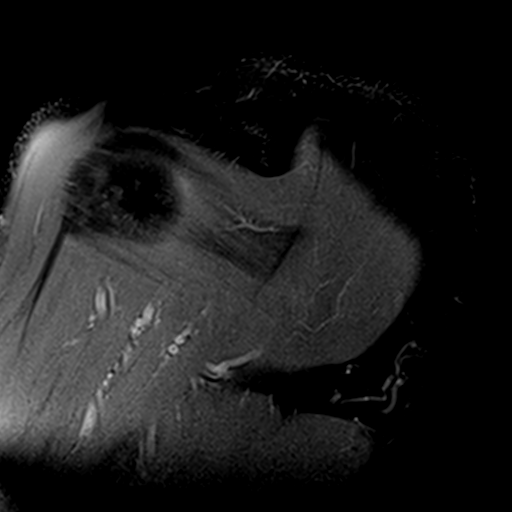

[Series 6: T1 · oblique · 4.0mm · 0.28mm/px · 9 of 24 slices shown]
[im 1/24]
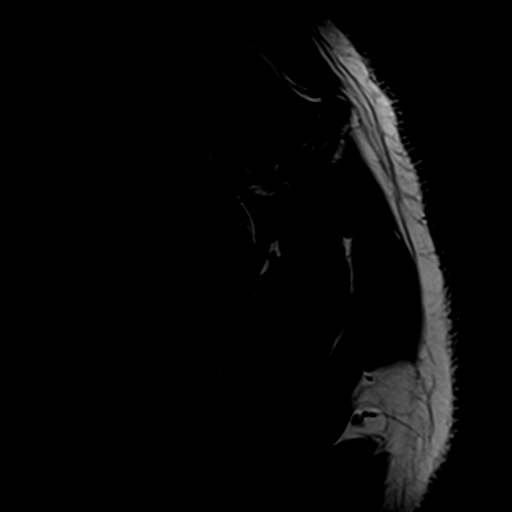
[im 3/24]
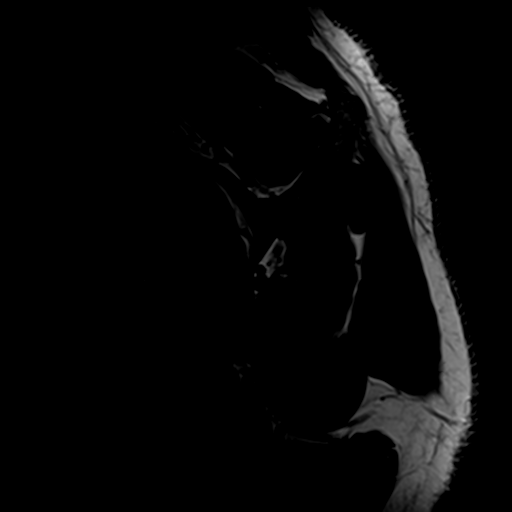
[im 6/24]
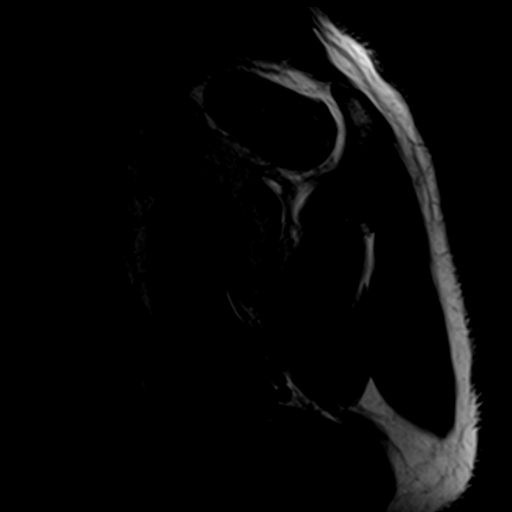
[im 9/24]
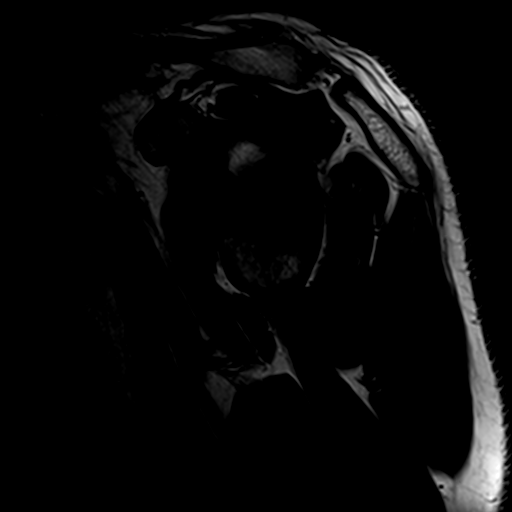
[im 12/24]
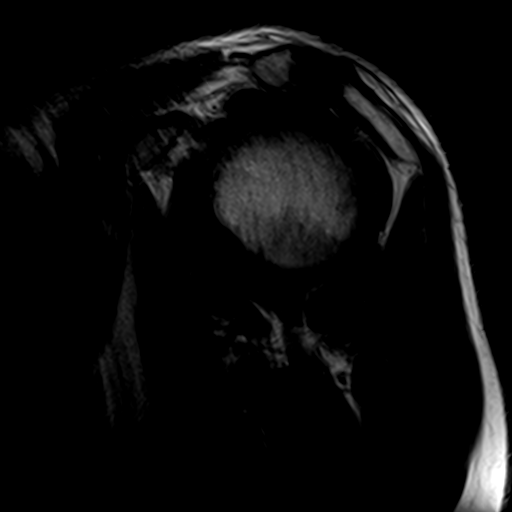
[im 15/24]
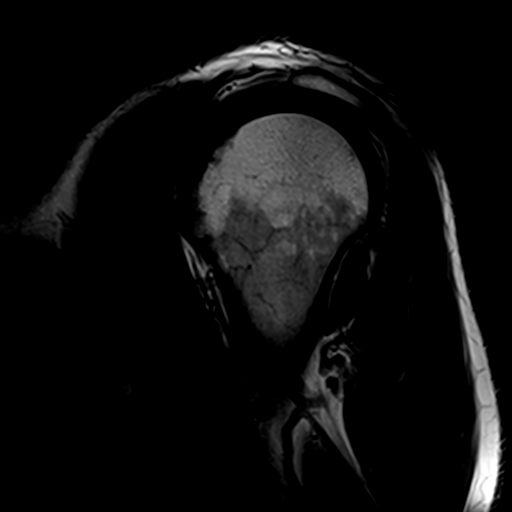
[im 18/24]
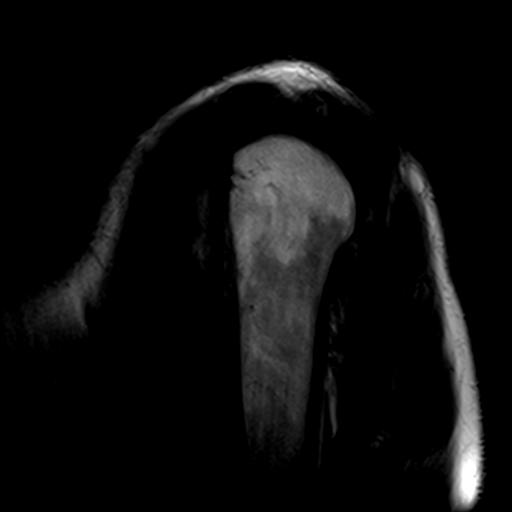
[im 21/24]
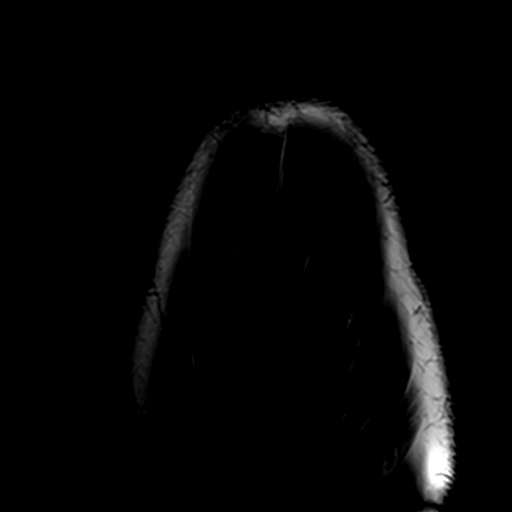
[im 24/24]
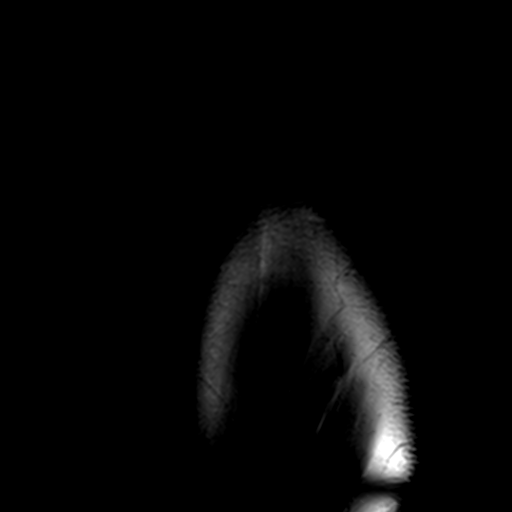

[19 of 40 positions shown; findings below may reference images not displayed]

FINDINGS: Rotator cuff: Mild to moderate rotator cuff tendinopathy/tendinosis
with small interstitial tears but no partial or full-thickness tear.

Muscles:  Normal

Biceps long head:  Intact

Acromioclavicular Joint: Mild degenerative changes. Type 2 acromion.
Mild lateral downsloping.

Glenohumeral Joint: No significant degenerative changes or joint
effusion. There is fairly marked thickening of the capsular
structures in the axillary recess suggesting synovitis or adhesive
capsulitis.

Labrum:  No obvious labral tears.

Bones:  No acute bony findings.

Other: No significant subacromial/subdeltoid fluid collections to
suggest bursitis.
IMPRESSION: 1. Mild to moderate rotator cuff tendinopathy/tendinosis but no
partial or full-thickness tear.
2. Intact long head biceps tendon and glenoid labrum.
3. There is thickening of the capsular structures in the axillary
recess which can be seen with adhesive capsulitis or synovitis.
4. No significant findings for bony impingement.

## 2019-03-27 ENCOUNTER — Telehealth: Payer: Self-pay | Admitting: *Deleted

## 2019-03-27 NOTE — Telephone Encounter (Signed)
FYI: Pt was seen on 9/4. He thought he was having surgery clearance. I called to give results of cxr and he was inquiring about if he was cleared for surgery. [facility] spoke to [place] who informed pt last seen 4/20. Chart noted that since patient has severe COPD and has d/c 02 he is therefore considered a NON surgical candidate.  [facility]  Dr. Monico Hoar patient back and notified of the above. He says he will not be back to neither provider.  Nothing further needed.

## 2019-03-27 NOTE — Telephone Encounter (Signed)
Can you look into this , his PFT were improved and no longer needed oxygen  Dr. Lamonte Sakai  Did surgical clearance at 01/2019 .  Does other papers needed to be completed.

## 2019-03-28 NOTE — Telephone Encounter (Signed)
Sorry - did not document LMOM TCB x1 that was done on 9.10.2020

## 2019-04-01 NOTE — Telephone Encounter (Signed)
Called and spoke with pt. Pt states he had a PFT done recently that showed his breathing had improved and he no longer needed oxygen; therefore, he is inquiring if he can receive surgical clearance for "back surgery" at [facility]. He states his surgeon, Dr. Kathyrn Sheriff, and Dr. Lamonte Sakai had spoken over the phone in April 2020 of the matter and determined pt was not a surgical candidate then; however, that he feels his recent PFT results may lift those restrictions. I let him know I would get a message routed to [facility] for follow-up.   RB, please advise on the surgical clearance of this pt. Pt states [facility] has not sent any forms over to be signed at this time; therefore, some form of documentation would likely need to be faxed to [facility]. Thank you.

## 2019-04-01 NOTE — Telephone Encounter (Signed)
I reviewed the patient's pulmonary function testing.  I would agree that there has been some improvement since our original assessment.  He does still have moderate to moderately severe obstructive lung disease.  This places him at a moderately increased risk for general anesthesia or back surgery, but does not preclude him from having the surgery if Dr. Kathyrn Sheriff agrees.

## 2019-04-01 NOTE — Telephone Encounter (Signed)
Called and spoke with the pt and notified of response per Dr Lamonte Sakai  He verbalized understanding  Copy of PFT faxed to Dr Kathyrn Sheriff

## 2019-04-01 NOTE — Telephone Encounter (Signed)
Pt is asking to speak w/ a provider about his results for surgery clearance.  Pt can be reached at [phone].

## 2019-07-24 ENCOUNTER — Other Ambulatory Visit: Payer: Self-pay | Admitting: Emergency Medicine

## 2019-11-13 ENCOUNTER — Other Ambulatory Visit: Payer: Self-pay

## 2019-11-13 ENCOUNTER — Ambulatory Visit (INDEPENDENT_AMBULATORY_CARE_PROVIDER_SITE_OTHER): Payer: Medicaid Other | Admitting: Adult Health

## 2019-11-13 ENCOUNTER — Encounter: Payer: Self-pay | Admitting: Adult Health

## 2019-11-13 DIAGNOSIS — J9611 Chronic respiratory failure with hypoxia: Secondary | ICD-10-CM

## 2019-11-13 DIAGNOSIS — J438 Other emphysema: Secondary | ICD-10-CM

## 2019-11-13 DIAGNOSIS — J449 Chronic obstructive pulmonary disease, unspecified: Secondary | ICD-10-CM

## 2019-11-13 DIAGNOSIS — R531 Weakness: Secondary | ICD-10-CM

## 2019-11-13 DIAGNOSIS — R0602 Shortness of breath: Secondary | ICD-10-CM
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
## 2019-11-13 NOTE — Patient Instructions (Signed)
Recommend purchasing a pulse oximeter to check your oxygen levels at home.  Oxygen level should be greater than 90%. If these episodes continue I recommend that you go to the emergency room for evaluation  Continue on TRELEGY 1 puff daily ,rinse after use.  Work on not smoking .  Follow up with Dr. Lamonte Sakai  In 1 week or APP with chest xray and As needed   Please contact office for sooner follow up if symptoms do not improve or worsen or seek emergency care

## 2019-11-21 ENCOUNTER — Ambulatory Visit: Payer: Medicaid Other | Admitting: Adult Health

## 2019-11-21 ENCOUNTER — Encounter: Payer: Self-pay | Admitting: Adult Health

## 2019-11-21 ENCOUNTER — Ambulatory Visit (INDEPENDENT_AMBULATORY_CARE_PROVIDER_SITE_OTHER): Payer: Medicaid Other

## 2019-11-21 ENCOUNTER — Other Ambulatory Visit: Payer: Self-pay

## 2019-11-21 ENCOUNTER — Other Ambulatory Visit: Payer: Self-pay | Admitting: *Deleted

## 2019-11-21 DIAGNOSIS — J438 Other emphysema: Secondary | ICD-10-CM | POA: Diagnosis not present

## 2019-11-21 DIAGNOSIS — J9611 Chronic respiratory failure with hypoxia: Secondary | ICD-10-CM | POA: Diagnosis not present

## 2019-11-21 DIAGNOSIS — R55 Syncope and collapse: Secondary | ICD-10-CM | POA: Diagnosis not present

## 2019-11-21 DIAGNOSIS — R42 Dizziness and giddiness: Secondary | ICD-10-CM
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
## 2019-11-21 NOTE — Patient Instructions (Addendum)
Recommend labs today , call back if you change your mind  Follow up with Primary MD as discussed  Refer to cardiology for pre-syncopal episodes.  Continue on TRELEGY 1 puff daily ,rinse after use.  Work on not smoking .  Follow up with Dr. Lamonte Sakai  In 3 months and As needed   Please contact office for sooner follow up if symptoms do not improve or worsen or seek emergency care

## 2019-11-21 NOTE — Assessment & Plan Note (Signed)
Questionable etiology could be secondary to orthostasis however patient has ongoing weight loss.  Have suggested lab work today.  Patient declines.  Advised to follow-up with primary care. Refer to Cardiology for pre-syncope   Plan  Patient Instructions  Recommend labs today , call back if you change your mind  Follow up with Primary MD as discussed  Refer to cardiology for pre-syncopal episodes.  Continue on TRELEGY 1 puff daily ,rinse after use.  Work on not smoking .  Follow up with Dr. Lamonte Sakai  In 3 months and As needed   Please contact office for sooner follow up if symptoms do not improve or worsen or seek emergency care

## 2019-11-21 NOTE — Assessment & Plan Note (Signed)
Appears stable without current exacerbation.  Patient has high symptom burden reserved conditioning and multiple comorbidities including chronic pain syndrome Advised on using caution with ongoing use of daily narcotics as may affect his respiratory system.  He does not have any exertional desaturations.  Plan  Patient Instructions  Recommend labs today , call back if you change your mind  Follow up with Primary MD as discussed  Refer to cardiology for pre-syncopal episodes.  Continue on TRELEGY 1 puff daily ,rinse after use.  Work on not smoking .  Follow up with Dr. Lamonte Sakai  In 3 months and As needed   Please contact office for sooner follow up if symptoms do not improve or worsen or seek emergency care

## 2019-11-21 NOTE — Assessment & Plan Note (Signed)
No desaturations with ambulation on room air.  No need for oxygen at this time

## 2019-12-22 ENCOUNTER — Telehealth: Payer: Self-pay | Admitting: Adult Health

## 2019-12-22 DIAGNOSIS — R55 Syncope and collapse: Secondary | ICD-10-CM

## 2019-12-22 DIAGNOSIS — R42 Dizziness and giddiness: Secondary | ICD-10-CM

## 2019-12-22 NOTE — Telephone Encounter (Signed)
Cardiology referral was discussed with pt at last OV. This has been placed.  Called and spoke with pt letting him know this had been done and he verbalized understanding. Nothing further needed.

## 2019-12-24 ENCOUNTER — Encounter: Payer: Self-pay | Admitting: Nurse Practitioner

## 2019-12-24 ENCOUNTER — Ambulatory Visit (INDEPENDENT_AMBULATORY_CARE_PROVIDER_SITE_OTHER): Payer: Medicaid Other | Admitting: Nurse Practitioner

## 2019-12-24 ENCOUNTER — Other Ambulatory Visit: Payer: Self-pay

## 2019-12-24 VITALS — BP 144/87 | HR 84 | Temp 97.3°F | Ht 74.5 in | Wt 160.2 lb

## 2019-12-24 DIAGNOSIS — R1310 Dysphagia, unspecified: Secondary | ICD-10-CM | POA: Diagnosis not present

## 2019-12-24 DIAGNOSIS — R634 Abnormal weight loss: Secondary | ICD-10-CM

## 2019-12-24 DIAGNOSIS — R1319 Other dysphagia: Secondary | ICD-10-CM

## 2019-12-24 NOTE — Assessment & Plan Note (Signed)
The patient notes about 30 pound weight loss recently in the setting of multiple comorbidities including significant COPD while still smoking, possible cardiac etiology (pending evaluation), and poor appetite related to significant ongoing dysphagia.  At this point I will recommend that he try to supplement with Ensure/protein drinks as much as possible in order to keep his nutrition maintained.  Further recommendations as per below.  Follow-up in 2 months.

## 2019-12-24 NOTE — Progress Notes (Signed)
Primary Care Physician:  Lucia Gaskins, MD Primary Gastroenterologist:  Dr. Gala Romney  Chief Complaint  Patient presents with  . Dysphagia    eating/drinking makes it hard  . Weight Loss    lost 30lbs in 3 months    HPI:   Sean Thomas is a 59 y.o. male who presents for difficulty swallowing.  Patient has not been seen in our office since 11/22/2016 when he was seen for IDA.  Attempted colonoscopy and November 2017 noted inadequate prep.  Hemoglobin had been stable around 8.5, ferritin declined to 15.  Started on oral iron.  Regular bowel movements, no obvious bleeding, upper GI symptoms controlled.  Patient really wanted to complete a colonoscopy but notes his severe lung disease and indicated it would be up to anesthesia on whether or not he can be safely sedated.  Colonoscopy was eventually scheduled.  He check with our office for complaints of possible melena and recommended stat CBC.  ER precautions given.  This is completed and found H/H stable.  Hold iron to see if "black" stools clear up, avoid Pepto.  Stools appeared normal after stopping iron.  The patient called our office 12/28/2016 to cancel his colonoscopy because he "did not feel like getting it done."  Most recent pulmonary office visit dated 11/21/2019.  Noted continued smoking, patient more symptomatic.  Spirometry completed with FEV1 64% and FVC 70%.  No significant bronchodilator response.  Previously on oxygen that was discontinued in 2020 with no desats on room air with activity or rest.  At his office visit 99% room air sats, with ambulation no desaturations less than 90% on room air.  Chest x-ray essentially normal.  Continues to smoke.  Overall COPD felt stable without exacerbation, high symptom burden reserve conditioning and multiple comorbidities.  Recommended labs but it appears patient declined.  Refer to cardiology for presyncopal episodes.  Smoking cessation.  Follow-up in 3 months.  Today he states he's doing  ok overall. Has had dysphagia with solid foods since 3 months ago, progressively getting worse. Solid food dysphagia, sometimes gets stuck for 2-3 hours and drinking fluids doesn't help. This is occurring about 3-4 times a week. Has had decreased oral intake due to symptoms and lost 30 lbs subjectively. Able to drink liquids, but has odynophagia with liquids. He crushes his pills and mixes them with water to take them. Denies abdominal pain other than LLQ where he previously had a hernia repaired. Denies overt GERD symptoms other than frequent belching. He states his lung doctor stated his heart is bad. Has an appointment with cardiology in about 1 month. Denies N/V, hematochezia. His stools are always dark, not on iron. Denies fever, chills. Denies URI or flu-like symptoms. Denies loss of sense of taste or smell. The patient has not received COVID-19 vaccination(s). They decline information today. Has dizziness, lightheadedness. When he stands he falls and has to crawl around; was told this is from his heart. Denies syncope. Denies any other upper or lower GI symptoms.  Past Medical History:  Diagnosis Date  . Cervical pain 11/21/2012  . COPD (chronic obstructive pulmonary disease) ([place])   . DDD (degenerative disc disease)   . Fracture of multiple ribs 11/01/2012  . Fracture of occipital condyle ([place]) 11/21/2012  . Headache   . Hyperlipidemia   . Hypertension   . Inguinal hernia 12/19/2013  . Microcytic anemia    Noted August 2017  . Shortness of breath dyspnea     Past Surgical History:  Procedure Laterality Date  . BACK SURGERY    . COLONOSCOPY WITH PROPOFOL N/A 06/15/2016   RMR: inadequate bowel prep all mucosal surfaces not well seen. 5 mm tubular adenoma removed from the hepatic flexure. 3 mm polyp from the rectum was hyperplastic. He had scattered diverticula  . ESOPHAGOGASTRODUODENOSCOPY (EGD) WITH PROPOFOL N/A 06/15/2016   Dr. Gala Romney: Large hiatal hernia, reflux esophagitis, Schatzki ring  with focal area of ulceration, status post dilation with 20F Maloney dilator with moderate improvement in luminal narrowing.  . INGUINAL HERNIA REPAIR Left 07/24/2014   Procedure: LAPAROSCOPIC LEFT INGUINAL HERNIA REPAIR ;  Surgeon: Ralene Ok, MD;  Location: [facility];  Service: General;  Laterality: Left;  . INSERTION OF MESH Left 07/24/2014   Procedure: INSERTION OF MESH;  Surgeon: Ralene Ok, MD;  Location: [facility];  Service: General;  Laterality: Left;  . LUMBAR FUSION    . MALONEY DILATION N/A 06/15/2016   Procedure: Venia Minks DILATION;  Surgeon: Daneil Dolin, MD;  [facility];  Service: Endoscopy;  Laterality: N/A;  . POLYPECTOMY  06/15/2016   Procedure: POLYPECTOMY;  Surgeon: Daneil Dolin, MD;  [facility];  Service: Endoscopy;;  colon  . right lung surgery Right     Current Outpatient Medications  Medication Sig Dispense Refill  . albuterol (PROVENTIL) (2.5 MG/3ML) 0.083% nebulizer solution Take 3 mLs (2.5 mg total) by nebulization every 6 (six) hours as needed for wheezing or shortness of breath. 75 mL 3  . albuterol (VENTOLIN HFA) 108 (90 Base) MCG/ACT inhaler INHALE 2 PUFFS INTO THE LUNGS EVERY 4 HOURS AS NEEDED. 8.5 g 2  . docusate sodium (COLACE) 100 MG capsule Take 200 mg by mouth daily.     . Fluticasone-Umeclidin-Vilant (TRELEGY ELLIPTA) 100-62.5-25 MCG/INH AEPB Inhale 1 puff into the lungs daily.    . Oxycodone HCl 10 MG TABS Take 10 mg by mouth every 6 (six) hours.     . pantoprazole (PROTONIX) 40 MG tablet TAKE ONE TABLET BY MOUTH TWICE A DAY 60 tablet 5  . predniSONE (DELTASONE) 10 MG tablet TAKE 1 TABLET DAILY WITH BREAKFAST. 30 tablet 11   No current facility-administered medications for this visit.    Allergies as of 12/24/2019 - Review Complete 12/24/2019  Allergen Reaction Noted  . Advil [ibuprofen] Diarrhea 07/20/2014  . Aleve [naproxen sodium] Nausea And Vomiting 04/23/2011    Family History  Problem Relation Age of Onset  .  Diabetes Mother   . Alzheimer's disease Mother   . Diabetes Sister   . Colon cancer Neg Hx   . Liver disease Neg Hx     Social History   Socioeconomic History  . Marital status: Single    Spouse name: Not on file  . Number of children: Not on file  . Years of education: Not on file  . Highest education level: Not on file  Occupational History  . Not on file  Tobacco Use  . Smoking status: Current Every Day Smoker    Packs/day: 1.00    Years: 45.00    Pack years: 45.00    Types: Cigarettes  . Smokeless tobacco: Never Used  Substance and Sexual Activity  . Alcohol use: No    Comment: denied 11/22/16  . Drug use: Yes    Types: Marijuana    Comment: former use, none in years. cocaine and marijuana in the past. uses marijuana still occas 12/24/2019  . Sexual activity: Yes    Birth control/protection: None  Other Topics Concern  .  Not on file  Social History Narrative  . Not on file   Social Determinants of Health   Financial Resource Strain:   . Difficulty of Paying Living Expenses:   Food Insecurity:   . Worried About Charity fundraiser in the Last Year:   . Arboriculturist in the Last Year:   Transportation Needs:   . Film/video editor (Medical):   Marland Kitchen Lack of Transportation (Non-Medical):   Physical Activity:   . Days of Exercise per Week:   . Minutes of Exercise per Session:   Stress:   . Feeling of Stress :   Social Connections:   . Frequency of Communication with Friends and Family:   . Frequency of Social Gatherings with Friends and Family:   . Attends Religious Services:   . Active Member of Clubs or Organizations:   . Attends Archivist Meetings:   Marland Kitchen Marital Status:   Intimate Partner Violence:   . Fear of Current or Ex-Partner:   . Emotionally Abused:   Marland Kitchen Physically Abused:   . Sexually Abused:     Subjective: Review of Systems  Constitutional: Positive for malaise/fatigue and weight loss. Negative for chills and fever.  HENT:  Negative for congestion and sore throat.   Respiratory: Positive for shortness of breath and wheezing. Negative for cough.   Cardiovascular: Positive for chest pain. Negative for palpitations.  Gastrointestinal: Positive for abdominal pain. Negative for blood in stool, diarrhea, melena, nausea and vomiting.  Musculoskeletal: Positive for back pain. Negative for joint pain and myalgias.  Skin: Negative for rash.  Neurological: Positive for dizziness and weakness.  Endo/Heme/Allergies: Does not bruise/bleed easily.  Psychiatric/Behavioral: Negative for depression. The patient is not nervous/anxious.   All other systems reviewed and are negative.      Objective: BP (!) 144/87   Pulse 84   Temp (!) 97.3 F (36.3 C) (Oral)   Ht 6' 2.5" (1.892 m)   Wt 160 lb 3.2 oz (72.7 kg)   BMI 20.29 kg/m  Physical Exam Vitals and nursing note reviewed.  Constitutional:      General: He is not in acute distress.    Appearance: Normal appearance. He is normal weight. He is not ill-appearing, toxic-appearing or diaphoretic.  HENT:     Head: Normocephalic and atraumatic.     Nose: No congestion or rhinorrhea.  Eyes:     General: No scleral icterus. Cardiovascular:     Rate and Rhythm: Normal rate and regular rhythm.     Heart sounds: Normal heart sounds.  Pulmonary:     Effort: Pulmonary effort is normal. No tachypnea, bradypnea, accessory muscle usage or respiratory distress.     Breath sounds: Decreased air movement present. Examination of the right-upper field reveals decreased breath sounds. Examination of the left-upper field reveals decreased breath sounds. Examination of the right-middle field reveals decreased breath sounds. Examination of the left-middle field reveals decreased breath sounds. Examination of the right-lower field reveals decreased breath sounds. Examination of the left-lower field reveals decreased breath sounds. Decreased breath sounds present.  Abdominal:     General:  Bowel sounds are normal. There is no distension.     Palpations: Abdomen is soft. There is no hepatomegaly, splenomegaly or mass.     Tenderness: There is no abdominal tenderness. There is no guarding or rebound.     Hernia: No hernia is present.  Musculoskeletal:     Cervical back: Neck supple.  Skin:    General: Skin  is warm and dry.     Coloration: Skin is not jaundiced.     Findings: No bruising or rash.  Neurological:     General: No focal deficit present.     Mental Status: He is alert and oriented to person, place, and time. Mental status is at baseline.  Psychiatric:        Mood and Affect: Mood normal.        Behavior: Behavior normal.        Thought Content: Thought content normal.       12/24/2019 10:07 AM   Disclaimer: This note was dictated with voice recognition software. Similar sounding words can inadvertently be transcribed and may not be corrected upon review.

## 2019-12-24 NOTE — Assessment & Plan Note (Addendum)
The patient notes solid food and pill dysphagia started about 3 months ago.  Is gotten progressively worse.  Occurs about 4-5 times a week and we will typically sit there for 2 to 3 hours.  Attempts fluids that do not help anything move forward.  Eventually will pass with time.  Associated odynophagia.  He is able to drink liquids but does have odynophagia with this as well.  Is crushing his pills and mixing with water in order to get them down.  Is on Protonix.  He does also note weight loss as per above.  He does have significant comorbidities including COPD.  He states today that his pulmonary doctor indicated that his weakness, dyspnea, etc. are likely related to his heart and he has been referred to cardiology and will see them in 1 month.  At this point given his significant symptoms I will have staff message cardiology to see if they are able to pump his appointment up to sooner.  I will get a stat BPE in the interim to check for obvious strictures or masses.  We will eventually need an EGD with possible dilation as soon as can be scheduled after cardiology clearance.  Follow-up in 2 months.

## 2019-12-24 NOTE — H&P (View-Only) (Signed)
Primary Care Physician:  Lucia Gaskins, MD Primary Gastroenterologist:  Dr. Gala Romney  Chief Complaint  Patient presents with  . Dysphagia    eating/drinking makes it hard  . Weight Loss    lost 30lbs in 3 months    HPI:   Sean Thomas is a 59 y.o. male who presents for difficulty swallowing.  Patient has not been seen in our office since 11/22/2016 when he was seen for IDA.  Attempted colonoscopy and November 2017 noted inadequate prep.  Hemoglobin had been stable around 8.5, ferritin declined to 15.  Started on oral iron.  Regular bowel movements, no obvious bleeding, upper GI symptoms controlled.  Patient really wanted to complete a colonoscopy but notes his severe lung disease and indicated it would be up to anesthesia on whether or not he can be safely sedated.  Colonoscopy was eventually scheduled.  He check with our office for complaints of possible melena and recommended stat CBC.  ER precautions given.  This is completed and found H/H stable.  Hold iron to see if "black" stools clear up, avoid Pepto.  Stools appeared normal after stopping iron.  The patient called our office 12/28/2016 to cancel his colonoscopy because he "did not feel like getting it done."  Most recent pulmonary office visit dated 11/21/2019.  Noted continued smoking, patient more symptomatic.  Spirometry completed with FEV1 64% and FVC 70%.  No significant bronchodilator response.  Previously on oxygen that was discontinued in 2020 with no desats on room air with activity or rest.  At his office visit 99% room air sats, with ambulation no desaturations less than 90% on room air.  Chest x-ray essentially normal.  Continues to smoke.  Overall COPD felt stable without exacerbation, high symptom burden reserve conditioning and multiple comorbidities.  Recommended labs but it appears patient declined.  Refer to cardiology for presyncopal episodes.  Smoking cessation.  Follow-up in 3 months.  Today he states he's doing  ok overall. Has had dysphagia with solid foods since 3 months ago, progressively getting worse. Solid food dysphagia, sometimes gets stuck for 2-3 hours and drinking fluids doesn't help. This is occurring about 3-4 times a week. Has had decreased oral intake due to symptoms and lost 30 lbs subjectively. Able to drink liquids, but has odynophagia with liquids. He crushes his pills and mixes them with water to take them. Denies abdominal pain other than LLQ where he previously had a hernia repaired. Denies overt GERD symptoms other than frequent belching. He states his lung doctor stated his heart is bad. Has an appointment with cardiology in about 1 month. Denies N/V, hematochezia. His stools are always dark, not on iron. Denies fever, chills. Denies URI or flu-like symptoms. Denies loss of sense of taste or smell. The patient has not received COVID-19 vaccination(s). They decline information today. Has dizziness, lightheadedness. When he stands he falls and has to crawl around; was told this is from his heart. Denies syncope. Denies any other upper or lower GI symptoms.  Past Medical History:  Diagnosis Date  . Cervical pain 11/21/2012  . COPD (chronic obstructive pulmonary disease) ([place])   . DDD (degenerative disc disease)   . Fracture of multiple ribs 11/01/2012  . Fracture of occipital condyle ([place]) 11/21/2012  . Headache   . Hyperlipidemia   . Hypertension   . Inguinal hernia 12/19/2013  . Microcytic anemia    Noted August 2017  . Shortness of breath dyspnea     Past Surgical History:  Procedure Laterality Date  . BACK SURGERY    . COLONOSCOPY WITH PROPOFOL N/A 06/15/2016   RMR: inadequate bowel prep all mucosal surfaces not well seen. 5 mm tubular adenoma removed from the hepatic flexure. 3 mm polyp from the rectum was hyperplastic. He had scattered diverticula  . ESOPHAGOGASTRODUODENOSCOPY (EGD) WITH PROPOFOL N/A 06/15/2016   Dr. Gala Romney: Large hiatal hernia, reflux esophagitis, Schatzki ring  with focal area of ulceration, status post dilation with 39F Maloney dilator with moderate improvement in luminal narrowing.  . INGUINAL HERNIA REPAIR Left 07/24/2014   Procedure: LAPAROSCOPIC LEFT INGUINAL HERNIA REPAIR ;  Surgeon: Ralene Ok, MD;  Location: [facility];  Service: General;  Laterality: Left;  . INSERTION OF MESH Left 07/24/2014   Procedure: INSERTION OF MESH;  Surgeon: Ralene Ok, MD;  Location: [facility];  Service: General;  Laterality: Left;  . LUMBAR FUSION    . MALONEY DILATION N/A 06/15/2016   Procedure: Venia Minks DILATION;  Surgeon: Daneil Dolin, MD;  [facility];  Service: Endoscopy;  Laterality: N/A;  . POLYPECTOMY  06/15/2016   Procedure: POLYPECTOMY;  Surgeon: Daneil Dolin, MD;  [facility];  Service: Endoscopy;;  colon  . right lung surgery Right     Current Outpatient Medications  Medication Sig Dispense Refill  . albuterol (PROVENTIL) (2.5 MG/3ML) 0.083% nebulizer solution Take 3 mLs (2.5 mg total) by nebulization every 6 (six) hours as needed for wheezing or shortness of breath. 75 mL 3  . albuterol (VENTOLIN HFA) 108 (90 Base) MCG/ACT inhaler INHALE 2 PUFFS INTO THE LUNGS EVERY 4 HOURS AS NEEDED. 8.5 g 2  . docusate sodium (COLACE) 100 MG capsule Take 200 mg by mouth daily.     . Fluticasone-Umeclidin-Vilant (TRELEGY ELLIPTA) 100-62.5-25 MCG/INH AEPB Inhale 1 puff into the lungs daily.    . Oxycodone HCl 10 MG TABS Take 10 mg by mouth every 6 (six) hours.     . pantoprazole (PROTONIX) 40 MG tablet TAKE ONE TABLET BY MOUTH TWICE A DAY 60 tablet 5  . predniSONE (DELTASONE) 10 MG tablet TAKE 1 TABLET DAILY WITH BREAKFAST. 30 tablet 11   No current facility-administered medications for this visit.    Allergies as of 12/24/2019 - Review Complete 12/24/2019  Allergen Reaction Noted  . Advil [ibuprofen] Diarrhea 07/20/2014  . Aleve [naproxen sodium] Nausea And Vomiting 04/23/2011    Family History  Problem Relation Age of Onset  .  Diabetes Mother   . Alzheimer's disease Mother   . Diabetes Sister   . Colon cancer Neg Hx   . Liver disease Neg Hx     Social History   Socioeconomic History  . Marital status: Single    Spouse name: Not on file  . Number of children: Not on file  . Years of education: Not on file  . Highest education level: Not on file  Occupational History  . Not on file  Tobacco Use  . Smoking status: Current Every Day Smoker    Packs/day: 1.00    Years: 45.00    Pack years: 45.00    Types: Cigarettes  . Smokeless tobacco: Never Used  Substance and Sexual Activity  . Alcohol use: No    Comment: denied 11/22/16  . Drug use: Yes    Types: Marijuana    Comment: former use, none in years. cocaine and marijuana in the past. uses marijuana still occas 12/24/2019  . Sexual activity: Yes    Birth control/protection: None  Other Topics Concern  .  Not on file  Social History Narrative  . Not on file   Social Determinants of Health   Financial Resource Strain:   . Difficulty of Paying Living Expenses:   Food Insecurity:   . Worried About Charity fundraiser in the Last Year:   . Arboriculturist in the Last Year:   Transportation Needs:   . Film/video editor (Medical):   Marland Kitchen Lack of Transportation (Non-Medical):   Physical Activity:   . Days of Exercise per Week:   . Minutes of Exercise per Session:   Stress:   . Feeling of Stress :   Social Connections:   . Frequency of Communication with Friends and Family:   . Frequency of Social Gatherings with Friends and Family:   . Attends Religious Services:   . Active Member of Clubs or Organizations:   . Attends Archivist Meetings:   Marland Kitchen Marital Status:   Intimate Partner Violence:   . Fear of Current or Ex-Partner:   . Emotionally Abused:   Marland Kitchen Physically Abused:   . Sexually Abused:     Subjective: Review of Systems  Constitutional: Positive for malaise/fatigue and weight loss. Negative for chills and fever.  HENT:  Negative for congestion and sore throat.   Respiratory: Positive for shortness of breath and wheezing. Negative for cough.   Cardiovascular: Positive for chest pain. Negative for palpitations.  Gastrointestinal: Positive for abdominal pain. Negative for blood in stool, diarrhea, melena, nausea and vomiting.  Musculoskeletal: Positive for back pain. Negative for joint pain and myalgias.  Skin: Negative for rash.  Neurological: Positive for dizziness and weakness.  Endo/Heme/Allergies: Does not bruise/bleed easily.  Psychiatric/Behavioral: Negative for depression. The patient is not nervous/anxious.   All other systems reviewed and are negative.      Objective: BP (!) 144/87   Pulse 84   Temp (!) 97.3 F (36.3 C) (Oral)   Ht 6' 2.5" (1.892 m)   Wt 160 lb 3.2 oz (72.7 kg)   BMI 20.29 kg/m  Physical Exam Vitals and nursing note reviewed.  Constitutional:      General: He is not in acute distress.    Appearance: Normal appearance. He is normal weight. He is not ill-appearing, toxic-appearing or diaphoretic.  HENT:     Head: Normocephalic and atraumatic.     Nose: No congestion or rhinorrhea.  Eyes:     General: No scleral icterus. Cardiovascular:     Rate and Rhythm: Normal rate and regular rhythm.     Heart sounds: Normal heart sounds.  Pulmonary:     Effort: Pulmonary effort is normal. No tachypnea, bradypnea, accessory muscle usage or respiratory distress.     Breath sounds: Decreased air movement present. Examination of the right-upper field reveals decreased breath sounds. Examination of the left-upper field reveals decreased breath sounds. Examination of the right-middle field reveals decreased breath sounds. Examination of the left-middle field reveals decreased breath sounds. Examination of the right-lower field reveals decreased breath sounds. Examination of the left-lower field reveals decreased breath sounds. Decreased breath sounds present.  Abdominal:     General:  Bowel sounds are normal. There is no distension.     Palpations: Abdomen is soft. There is no hepatomegaly, splenomegaly or mass.     Tenderness: There is no abdominal tenderness. There is no guarding or rebound.     Hernia: No hernia is present.  Musculoskeletal:     Cervical back: Neck supple.  Skin:    General: Skin  is warm and dry.     Coloration: Skin is not jaundiced.     Findings: No bruising or rash.  Neurological:     General: No focal deficit present.     Mental Status: He is alert and oriented to person, place, and time. Mental status is at baseline.  Psychiatric:        Mood and Affect: Mood normal.        Behavior: Behavior normal.        Thought Content: Thought content normal.       12/24/2019 10:07 AM   Disclaimer: This note was dictated with voice recognition software. Similar sounding words can inadvertently be transcribed and may not be corrected upon review.

## 2019-12-24 NOTE — Patient Instructions (Signed)
Your health issues we discussed today were:   Swallowing difficulties with weight loss: 1. As discussed, you will eventually need an upper endoscopy with possible dilation due to your swallowing difficulties.  However, we have to have cardiology clearance given what you have said about your pulmonary doctor stating your heart is bad 2. We will message her cardiologist to see if they are able to see you any sooner 3. In the meantime continue taking Protonix 40 mg twice a day 4. I have put in for a stat swallow study to evaluate your swallowing function and for any narrowing/strictures 5. Try to drink Ensure or other protein drinks at least twice a day to help maintain your nutrition 6. Call us for any worsening or severe symptoms. 7. If food gets stuck in would not pass for more than 4 to 5 hours, or causes severe discomfort, proceed to the emergency room  Overall I recommend:  1. Continue your other current medications 2. Return for follow-up in 2 months 3. Call us if you have any questions or concerns.   At [facility] we value your feedback. You may receive a survey about your visit today. Please share your experience as we strive to create trusting relationships with our patients to provide genuine, compassionate, quality care.  We appreciate your understanding and patience as we review any laboratory studies, imaging, and other diagnostic tests that are ordered as we care for you. Our office policy is 5 business days for review of these results, and any emergent or urgent results are addressed in a timely manner for your best interest. If you do not hear from our office in 1 week, please contact us.   We also encourage the use of MyChart, which contains your medical information for your review as well. If you are not enrolled in this feature, an access code is on this after visit summary for your convenience. Thank you for allowing [place] to be involved in your care.  It was  great to see you today!  I hope you have a great Summer!!

## 2019-12-25 ENCOUNTER — Ambulatory Visit (HOSPITAL_COMMUNITY)
Admission: RE | Admit: 2019-12-25 | Discharge: 2019-12-25 | Disposition: A | Discharge status: Home / Self Care | Payer: Medicaid Other | Source: Ambulatory Visit | Attending: Nurse Practitioner | Admitting: Nurse Practitioner

## 2019-12-25 ENCOUNTER — Telehealth: Payer: Self-pay | Admitting: *Deleted

## 2019-12-25 DIAGNOSIS — R1310 Dysphagia, unspecified: Secondary | ICD-10-CM | POA: Insufficient documentation

## 2019-12-25 DIAGNOSIS — R1319 Other dysphagia: Secondary | ICD-10-CM

## 2019-12-25 DIAGNOSIS — R634 Abnormal weight loss: Secondary | ICD-10-CM

## 2019-12-25 NOTE — Telephone Encounter (Signed)
Working on getting an asap spot for this pt.

## 2019-12-25 NOTE — Telephone Encounter (Signed)
Diane from [facility] called with STAT on barium study.  She stated that report showed premature spillover of contrast with laryngeal penetration and aspiration of contrast down the posterior wall of the trachea.  Would like you to review ASAP.  Routing to EG and AM.

## 2019-12-25 NOTE — Telephone Encounter (Signed)
Reviewed. GE junction narrowing consistent with stricture. Cannot rule out mass. He needs ASAP/Urgent EGD +/- dilation with RMR on propofol/MAC.  I will call the patient to inform him.

## 2019-12-26 ENCOUNTER — Encounter: Payer: Self-pay | Admitting: *Deleted

## 2019-12-26 ENCOUNTER — Encounter (HOSPITAL_COMMUNITY)
Admission: RE | Admit: 2019-12-26 | Discharge: 2019-12-26 | Disposition: A | Discharge status: Home / Self Care | Payer: Medicaid Other | Source: Ambulatory Visit | Attending: Internal Medicine | Admitting: Internal Medicine

## 2019-12-26 ENCOUNTER — Other Ambulatory Visit: Payer: Self-pay

## 2019-12-26 ENCOUNTER — Other Ambulatory Visit (HOSPITAL_COMMUNITY)
Admission: RE | Admit: 2019-12-26 | Discharge: 2019-12-26 | Disposition: A | Discharge status: Home / Self Care | Payer: Medicaid Other | Source: Ambulatory Visit | Attending: Internal Medicine | Admitting: Internal Medicine

## 2019-12-26 ENCOUNTER — Encounter (HOSPITAL_COMMUNITY): Payer: Self-pay

## 2019-12-26 DIAGNOSIS — Z01818 Encounter for other preprocedural examination: Secondary | ICD-10-CM | POA: Insufficient documentation

## 2019-12-26 DIAGNOSIS — Z20822 Contact with and (suspected) exposure to covid-19: Secondary | ICD-10-CM | POA: Insufficient documentation

## 2019-12-26 LAB — CBC WITH DIFFERENTIAL/PLATELET
Abs Immature Granulocytes: 0.02 10*3/uL (ref 0.00–0.07)
Basophils Absolute: 0 10*3/uL (ref 0.0–0.1)
Basophils Relative: 0 %
Eosinophils Absolute: 0 10*3/uL (ref 0.0–0.5)
Eosinophils Relative: 0 %
HCT: 30 % — ABNORMAL LOW (ref 39.0–52.0)
Hemoglobin: 7.8 g/dL — ABNORMAL LOW (ref 13.0–17.0)
Immature Granulocytes: 0 %
Lymphocytes Relative: 20 %
Lymphs Abs: 1.7 10*3/uL (ref 0.7–4.0)
MCH: 16.8 pg — ABNORMAL LOW (ref 26.0–34.0)
MCHC: 26 g/dL — ABNORMAL LOW (ref 30.0–36.0)
MCV: 64.7 fL — ABNORMAL LOW (ref 80.0–100.0)
Monocytes Absolute: 0.3 10*3/uL (ref 0.1–1.0)
Monocytes Relative: 4 %
Neutro Abs: 6.7 10*3/uL (ref 1.7–7.7)
Neutrophils Relative %: 76 %
Platelets: 314 10*3/uL (ref 150–400)
RBC: 4.64 MIL/uL (ref 4.22–5.81)
RDW: 20.8 % — ABNORMAL HIGH (ref 11.5–15.5)
WBC: 8.7 10*3/uL (ref 4.0–10.5)
nRBC: 0 % (ref 0.0–0.2)

## 2019-12-26 NOTE — Patient Instructions (Signed)
Sean Thomas  12/26/2019     @PREFPERIOPPHARMACY @   Your procedure is scheduled on  12/29/2019 .  Report to [name] at  0800  A.M.  Call this number if you have problems the morning of surgery:  [phone]   Remember:  Do not eat or drink after midnight.                       Take these medicines the morning of surgery with A SIP OF WATER  Oxycodone(if needed), protonix. Use your nebulizer and your inhaler before you come.    Do not wear jewelry, make-up or nail polish.  Do not wear lotions, powders, or perfumes. Please wear deodorant and brush your teeth.  Do not shave 48 hours prior to surgery.  Men may shave face and neck.  Do not bring valuables to the hospital.  [facility] is not responsible for any belongings or valuables.  Contacts, dentures or bridgework may not be worn into surgery.  Leave your suitcase in the car.  After surgery it may be brought to your room.  For patients admitted to the hospital, discharge time will be determined by your treatment team.  Patients discharged the day of surgery will not be allowed to drive home.   Name and phone number of your driver:   family Special instructions:  DO NOT smoke the morning of your procedure.  Please read over the following fact sheets that you were given. Anesthesia Post-op Instructions and Care and Recovery After Surgery       Upper Endoscopy, Adult, Care After This sheet gives you information about how to care for yourself after your procedure. Your health care provider may also give you more specific instructions. If you have problems or questions, contact your health care provider. What can I expect after the procedure? After the procedure, it is common to have:  A sore throat.  Mild stomach pain or discomfort.  Bloating.  Nausea. Follow these instructions at home:   Follow instructions from your health care provider about what to eat or drink after your procedure.  Return  to your normal activities as told by your health care provider. Ask your health care provider what activities are safe for you.  Take over-the-counter and prescription medicines only as told by your health care provider.  Do not drive for 24 hours if you were given a sedative during your procedure.  Keep all follow-up visits as told by your health care provider. This is important. Contact a health care provider if you have:  A sore throat that lasts longer than one day.  Trouble swallowing. Get help right away if:  You vomit blood or your vomit looks like coffee grounds.  You have: ? A fever. ? Bloody, black, or tarry stools. ? A severe sore throat or you cannot swallow. ? Difficulty breathing. ? Severe pain in your chest or abdomen. Summary  After the procedure, it is common to have a sore throat, mild stomach discomfort, bloating, and nausea.  Do not drive for 24 hours if you were given a sedative during the procedure.  Follow instructions from your health care provider about what to eat or drink after your procedure.  Return to your normal activities as told by your health care provider. This information is not intended to replace advice given to you by your health care provider. Make sure you discuss any questions you have  with your health care provider. Document Revised: 12/25/2017 Document Reviewed: 12/03/2017 Elsevier Patient Education  [place].  Esophageal Dilatation Esophageal dilatation, also called esophageal dilation, is a procedure to widen or open (dilate) a blocked or narrowed part of the esophagus. The esophagus is the part of the body that moves food and liquid from the mouth to the stomach. You may need this procedure if:  You have a buildup of scar tissue in your esophagus that makes it difficult, painful, or impossible to swallow. This can be caused by gastroesophageal reflux disease (GERD).  You have cancer of the esophagus.  There is a  problem with how food moves through your esophagus. In some cases, you may need this procedure repeated at a later time to dilate the esophagus gradually. Tell a health care provider about:  Any allergies you have.  All medicines you are taking, including vitamins, herbs, eye drops, creams, and over-the-counter medicines.  Any problems you or family members have had with anesthetic medicines.  Any blood disorders you have.  Any surgeries you have had.  Any medical conditions you have.  Any antibiotic medicines you are required to take before dental procedures.  Whether you are pregnant or may be pregnant. What are the risks? Generally, this is a safe procedure. However, problems may occur, including:  Bleeding due to a tear in the lining of the esophagus.  A hole (perforation) in the esophagus. What happens before the procedure?  Follow instructions from your health care provider about eating or drinking restrictions.  Ask your health care provider about changing or stopping your regular medicines. This is especially important if you are taking diabetes medicines or blood thinners.  Plan to have someone take you home from the hospital or clinic.  Plan to have a responsible adult care for you for at least 24 hours after you leave the hospital or clinic. This is important. What happens during the procedure?  You may be given a medicine to help you relax (sedative).  A numbing medicine may be sprayed into the back of your throat, or you may gargle the medicine.  Your health care provider may perform the dilatation using various surgical instruments, such as: ? Simple dilators. This instrument is carefully placed in the esophagus to stretch it. ? Guided wire bougies. This involves using an endoscope to insert a wire into the esophagus. A dilator is passed over this wire to enlarge the esophagus. Then the wire is removed. ? Balloon dilators. An endoscope with a small balloon at  the end is inserted into the esophagus. The balloon is inflated to stretch the esophagus and open it up. The procedure may vary among health care providers and hospitals. What happens after the procedure?  Your blood pressure, heart rate, breathing rate, and blood oxygen level will be monitored until the medicines you were given have worn off.  Your throat may feel slightly sore and numb. This will improve slowly over time.  You will not be allowed to eat or drink until your throat is no longer numb.  When you are able to drink, urinate, and sit on the edge of the bed without nausea or dizziness, you may be able to return home. Follow these instructions at home:  Take over-the-counter and prescription medicines only as told by your health care provider.  Do not drive for 24 hours if you were given a sedative during your procedure.  You should have a responsible adult with you for 24 hours  after the procedure.  Follow instructions from your health care provider about any eating or drinking restrictions.  Do not use any products that contain nicotine or tobacco, such as cigarettes and e-cigarettes. If you need help quitting, ask your health care provider.  Keep all follow-up visits as told by your health care provider. This is important. Get help right away if you:  Have a fever.  Have chest pain.  Have pain that is not relieved by medication.  Have trouble breathing.  Have trouble swallowing.  Vomit blood. Summary  Esophageal dilatation, also called esophageal dilation, is a procedure to widen or open (dilate) a blocked or narrowed part of the esophagus.  Plan to have someone take you home from the hospital or clinic.  For this procedure, a numbing medicine may be sprayed into the back of your throat, or you may gargle the medicine.  Do not drive for 24 hours if you were given a sedative during your procedure. This information is not intended to replace advice given to  you by your health care provider. Make sure you discuss any questions you have with your health care provider. Document Revised: 04/30/2019 Document Reviewed: 05/08/2017 Elsevier Patient Education  2020 [place] After These instructions provide you with information about caring for yourself after your procedure. Your health care provider may also give you more specific instructions. Your treatment has been planned according to current medical practices, but problems sometimes occur. Call your health care provider if you have any problems or questions after your procedure. What can I expect after the procedure? After your procedure, you may:  Feel sleepy for several hours.  Feel clumsy and have poor balance for several hours.  Feel forgetful about what happened after the procedure.  Have poor judgment for several hours.  Feel nauseous or vomit.  Have a sore throat if you had a breathing tube during the procedure. Follow these instructions at home: For at least 24 hours after the procedure:      Have a responsible adult stay with you. It is important to have someone help care for you until you are awake and alert.  Rest as needed.  Do not: ? Participate in activities in which you could fall or become injured. ? Drive. ? Use heavy machinery. ? Drink alcohol. ? Take sleeping pills or medicines that cause drowsiness. ? Make important decisions or sign legal documents. ? Take care of children on your own. Eating and drinking  Follow the diet that is recommended by your health care provider.  If you vomit, drink water, juice, or soup when you can drink without vomiting.  Make sure you have little or no nausea before eating solid foods. General instructions  Take over-the-counter and prescription medicines only as told by your health care provider.  If you have sleep apnea, surgery and certain medicines can increase your risk for breathing  problems. Follow instructions from your health care provider about wearing your sleep device: ? Anytime you are sleeping, including during daytime naps. ? While taking prescription pain medicines, sleeping medicines, or medicines that make you drowsy.  If you smoke, do not smoke without supervision.  Keep all follow-up visits as told by your health care provider. This is important. Contact a health care provider if:  You keep feeling nauseous or you keep vomiting.  You feel light-headed.  You develop a rash.  You have a fever. Get help right away if:  You have trouble breathing.  Summary  For several hours after your procedure, you may feel sleepy and have poor judgment.  Have a responsible adult stay with you for at least 24 hours or until you are awake and alert. This information is not intended to replace advice given to you by your health care provider. Make sure you discuss any questions you have with your health care provider. Document Revised: 10/01/2017 Document Reviewed: 10/24/2015 Elsevier Patient Education  [place].

## 2019-12-26 NOTE — Telephone Encounter (Signed)
Noted, thanks for the assist

## 2019-12-26 NOTE — Telephone Encounter (Signed)
Mindy put in orders and has the pt on the schedule for Monday at 9:30. Preop appt is today at 2:00. Called pt informed him of procedure and preop. Advised him to please not be late to preop appt as he will be cancelled if he is late. Went over all instructions and what is and what isnt considered a clear liquid and nothing red or purple. Asked him to eat lightly on Sunday until 6pm and clear liquids from 6pm to midnight and NPO after midnight. Advised him he would be given arrival time at preop appt. Pt stated he understood all his instructions and when he needed to be at [facility].

## 2019-12-26 NOTE — Telephone Encounter (Signed)
Tried to call pt to let him know that we may have a cancellation for Monday and to see if he would be able to do an EGD on that day and a preop today. He said he would be available if we are able to get him in. I advised him that I would call him back asap.

## 2019-12-27 LAB — SARS CORONAVIRUS 2 (TAT 6-24 HRS): SARS Coronavirus 2: NEGATIVE

## 2019-12-29 ENCOUNTER — Encounter (HOSPITAL_COMMUNITY)
Admission: RE | Disposition: A | Discharge status: Home / Self Care | Payer: Self-pay | Source: Home / Self Care | Attending: Internal Medicine

## 2019-12-29 ENCOUNTER — Ambulatory Visit (HOSPITAL_COMMUNITY): Payer: Medicaid Other | Admitting: Anesthesiology

## 2019-12-29 ENCOUNTER — Ambulatory Visit (HOSPITAL_COMMUNITY)
Admission: RE | Admit: 2019-12-29 | Discharge: 2019-12-29 | Disposition: A | Discharge status: Home / Self Care | Payer: Medicaid Other | Attending: Internal Medicine | Admitting: Internal Medicine

## 2019-12-29 ENCOUNTER — Encounter (HOSPITAL_COMMUNITY): Payer: Self-pay | Admitting: Internal Medicine

## 2019-12-29 DIAGNOSIS — Z886 Allergy status to analgesic agent status: Secondary | ICD-10-CM | POA: Diagnosis not present

## 2019-12-29 DIAGNOSIS — R1310 Dysphagia, unspecified: Secondary | ICD-10-CM

## 2019-12-29 DIAGNOSIS — Z79899 Other long term (current) drug therapy: Secondary | ICD-10-CM | POA: Insufficient documentation

## 2019-12-29 DIAGNOSIS — B3781 Candidal esophagitis: Secondary | ICD-10-CM | POA: Diagnosis not present

## 2019-12-29 DIAGNOSIS — J449 Chronic obstructive pulmonary disease, unspecified: Secondary | ICD-10-CM | POA: Insufficient documentation

## 2019-12-29 DIAGNOSIS — I10 Essential (primary) hypertension: Secondary | ICD-10-CM | POA: Diagnosis not present

## 2019-12-29 DIAGNOSIS — Z7952 Long term (current) use of systemic steroids: Secondary | ICD-10-CM | POA: Insufficient documentation

## 2019-12-29 DIAGNOSIS — Z7951 Long term (current) use of inhaled steroids: Secondary | ICD-10-CM | POA: Diagnosis not present

## 2019-12-29 DIAGNOSIS — Z8719 Personal history of other diseases of the digestive system: Secondary | ICD-10-CM | POA: Insufficient documentation

## 2019-12-29 DIAGNOSIS — K228 Other specified diseases of esophagus: Secondary | ICD-10-CM | POA: Diagnosis not present

## 2019-12-29 DIAGNOSIS — K449 Diaphragmatic hernia without obstruction or gangrene: Secondary | ICD-10-CM | POA: Insufficient documentation

## 2019-12-29 DIAGNOSIS — F17210 Nicotine dependence, cigarettes, uncomplicated: Secondary | ICD-10-CM | POA: Diagnosis not present

## 2019-12-29 DIAGNOSIS — K219 Gastro-esophageal reflux disease without esophagitis: Secondary | ICD-10-CM | POA: Insufficient documentation

## 2019-12-29 DIAGNOSIS — K259 Gastric ulcer, unspecified as acute or chronic, without hemorrhage or perforation: Secondary | ICD-10-CM | POA: Insufficient documentation

## 2019-12-29 DIAGNOSIS — Z86010 Personal history of colonic polyps: Secondary | ICD-10-CM | POA: Insufficient documentation

## 2019-12-29 DIAGNOSIS — K222 Esophageal obstruction: Secondary | ICD-10-CM | POA: Diagnosis not present

## 2019-12-29 HISTORY — PX: ESOPHAGEAL BRUSHING: SHX6842

## 2019-12-29 HISTORY — PX: ESOPHAGOGASTRODUODENOSCOPY (EGD) WITH PROPOFOL: SHX5813

## 2019-12-29 HISTORY — PX: MALONEY DILATION: SHX5535

## 2019-12-29 LAB — KOH PREP

## 2019-12-29 SURGERY — ESOPHAGOGASTRODUODENOSCOPY (EGD) WITH PROPOFOL
Anesthesia: General

## 2019-12-29 MED ORDER — GLYCOPYRROLATE 0.2 MG/ML IJ SOLN
INTRAMUSCULAR | Status: AC
  Filled 2019-12-29: qty 1

## 2019-12-29 MED ORDER — SODIUM CHLORIDE FLUSH 0.9 % IV SOLN
INTRAVENOUS | Status: AC
  Filled 2019-12-29: qty 40

## 2019-12-29 MED ORDER — PROPOFOL 10 MG/ML IV BOLUS
INTRAVENOUS | Status: DC | PRN
  Administered 2019-12-29: 40 mg via INTRAVENOUS

## 2019-12-29 MED ORDER — LACTATED RINGERS IV SOLN: INTRAVENOUS | Status: DC

## 2019-12-29 MED ORDER — KETAMINE HCL 10 MG/ML IJ SOLN
INTRAMUSCULAR | Status: DC | PRN
  Administered 2019-12-29: 20 mg via INTRAVENOUS

## 2019-12-29 MED ORDER — LIDOCAINE VISCOUS HCL 2 % MT SOLN
OROMUCOSAL | Status: AC
  Filled 2019-12-29: qty 15

## 2019-12-29 MED ORDER — PROPOFOL 10 MG/ML IV BOLUS
INTRAVENOUS | Status: AC
  Filled 2019-12-29: qty 80

## 2019-12-29 MED ORDER — CHLORHEXIDINE GLUCONATE CLOTH 2 % EX PADS: 6.0000 | MEDICATED_PAD | Freq: Once | CUTANEOUS | Status: DC

## 2019-12-29 MED ORDER — GLYCOPYRROLATE 0.2 MG/ML IJ SOLN
0.2000 mg | Freq: Once | INTRAMUSCULAR | Status: AC
  Administered 2019-12-29: 0.2 mg via INTRAVENOUS

## 2019-12-29 MED ORDER — PROPOFOL 500 MG/50ML IV EMUL
INTRAVENOUS | Status: DC | PRN
  Administered 2019-12-29: 150 ug/kg/min via INTRAVENOUS

## 2019-12-29 MED ORDER — KETAMINE HCL 50 MG/5ML IJ SOSY
PREFILLED_SYRINGE | INTRAMUSCULAR | Status: AC
  Filled 2019-12-29: qty 5

## 2019-12-29 MED ORDER — LIDOCAINE VISCOUS HCL 2 % MT SOLN
15.0000 mL | Freq: Once | OROMUCOSAL | Status: AC
  Administered 2019-12-29: 15 mL via OROMUCOSAL

## 2019-12-29 NOTE — Transfer of Care (Signed)
Immediate Anesthesia Transfer of Care Note  Patient: [name]  Procedure(s) Performed: ESOPHAGOGASTRODUODENOSCOPY (EGD) WITH PROPOFOL (N/A ) MALONEY DILATION (N/A )  Patient Location: PACU  Anesthesia Type:General  Level of Consciousness: awake  Airway & Oxygen Therapy: Patient Spontanous Breathing  Post-op Assessment: Report given to RN  Post vital signs: Reviewed and stable  Last Vitals:  Vitals Value Taken Time  BP    Temp    Pulse 71 12/29/19 0759  Resp 13 12/29/19 0759  SpO2 91 % 12/29/19 0759  Vitals shown include unvalidated device data.  Last Pain:  Vitals:   12/29/19 0736  TempSrc:   PainSc: 0-No pain      Patients Stated Pain Goal: 9 (16/42/90 3795)  Complications: No complications documented.

## 2019-12-29 NOTE — Interval H&P Note (Signed)
History and Physical Interval Note:  12/29/2019 7:29 AM  Sean Thomas  has presented today for surgery, with the diagnosis of DYSPHAGIA, ABNORMAL BPE.  The various methods of treatment have been discussed with the patient and family. After consideration of risks, benefits and other options for treatment, the patient has consented to  Procedure(s) with comments: ESOPHAGOGASTRODUODENOSCOPY (EGD) WITH PROPOFOL (N/A) - 9:30am, PAT telling pt to arrive at [facility] (N/A) as a surgical intervention.  The patient's history has been reviewed, patient examined, no change in status, stable for surgery.  I have reviewed the patient's chart and labs.  Questions were answered to the patient's satisfaction.     Kizzy Olafson  No change.  Abnormal BPE suggesting stricture.  Here for EGD with esophageal dilation as feasible/appropriate per plan  The risks, benefits, limitations, alternatives and imponderables have been reviewed with the patient. Potential for esophageal dilation, biopsy, etc. have also been reviewed.  Questions have been answered. All parties agreeable.

## 2019-12-29 NOTE — Discharge Instructions (Signed)
EGD Discharge instructions Please read the instructions outlined below and refer to this sheet in the next few weeks. These discharge instructions provide you with general information on caring for yourself after you leave the hospital. Your doctor may also give you specific instructions. While your treatment has been planned according to the most current medical practices available, unavoidable complications occasionally occur. If you have any problems or questions after discharge, please call your doctor. ACTIVITY  You may resume your regular activity but move at a slower pace for the next 24 hours.   Take frequent rest periods for the next 24 hours.   Walking will help expel (get rid of) the air and reduce the bloated feeling in your abdomen.   No driving for 24 hours (because of the anesthesia (medicine) used during the test).   You may shower.   Do not sign any important legal documents or operate any machinery for 24 hours (because of the anesthesia used during the test).  NUTRITION  Drink plenty of fluids.   You may resume your normal diet.   Begin with a light meal and progress to your normal diet.   Avoid alcoholic beverages for 24 hours or as instructed by your caregiver.  MEDICATIONS  You may resume your normal medications unless your caregiver tells you otherwise.  WHAT YOU CAN EXPECT TODAY  You may experience abdominal discomfort such as a feeling of fullness or "gas" pains.  FOLLOW-UP  Your doctor will discuss the results of your test with you.  SEEK IMMEDIATE MEDICAL ATTENTION IF ANY OF THE FOLLOWING OCCUR:  Excessive nausea (feeling sick to your stomach) and/or vomiting.   Severe abdominal pain and distention (swelling).   Trouble swallowing.   Temperature over 101 F (37.8 C).   Rectal bleeding or vomiting of blood.   Your esophagus was dilated today.  You may have a yeast infection in your esophagus.  Samples taken  Further recommendations to follow  pending review of lab report.  Office follow-up visit with [place] in 6 weeks  At patient request I called Thayer Jew, brother, at [phone] to reach  PATIENT INSTRUCTIONS POST-ANESTHESIA  IMMEDIATELY FOLLOWING SURGERY:  Do not drive or operate machinery for the first twenty four hours after surgery.  Do not make any important decisions for twenty four hours after surgery or while taking narcotic pain medications or sedatives.  If you develop intractable nausea and vomiting or a severe headache please notify your doctor immediately.  FOLLOW-UP:  Please make an appointment with your surgeon as instructed. You do not need to follow up with anesthesia unless specifically instructed to do so.  WOUND CARE INSTRUCTIONS (if applicable):  Keep a dry clean dressing on the anesthesia/puncture wound site if there is drainage.  Once the wound has quit draining you may leave it open to air.  Generally you should leave the bandage intact for twenty four hours unless there is drainage.  If the epidural site drains for more than 36-48 hours please call the anesthesia department.  QUESTIONS?:  Please feel free to call your physician or the hospital operator if you have any questions, and they will be happy to assist you.

## 2019-12-29 NOTE — Op Note (Signed)
[facility] Patient Name: Sean Thomas Procedure Date: 12/29/2019 7:36 AM MRN: [id number] Date of Birth: March 11, 1961 Attending MD: Norvel Richards , MD CSN: [id number] Age: 59 Admit Type: Outpatient Procedure:                Upper GI endoscopy Indications:              Dysphagia Providers:                Norvel Richards, MD, Janeece Riggers, RN, Crystal                            Page, Aram Candela Referring MD:              Medicines:                Propofol per Anesthesia Complications:            No immediate complications. Estimated Blood Loss:     Estimated blood loss was minimal. Procedure:                Pre-Anesthesia Assessment:                           - Prior to the procedure, a History and Physical                            was performed, and patient medications and                            allergies were reviewed. The patient's tolerance of                            previous anesthesia was also reviewed. The risks                            and benefits of the procedure and the sedation                            options and risks were discussed with the patient.                            All questions were answered, and informed consent                            was obtained. Prior Anticoagulants: The patient has                            taken no previous anticoagulant or antiplatelet                            agents. ASA Grade Assessment: II - A patient with                            mild systemic disease. After reviewing the risks  and benefits, the patient was deemed in                            satisfactory condition to undergo the procedure.                           After obtaining informed consent, the endoscope was                            passed under direct vision. Throughout the                            procedure, the patient's blood pressure, pulse, and                            oxygen saturations were  monitored continuously. The                            GIF-H190 ([id number]) scope was introduced through the                            mouth, and advanced to the second part of duodenum.                            The upper GI endoscopy was accomplished without                            difficulty. The patient tolerated the procedure                            well. Scope In: 7:41:40 AM Scope Out: 7:50:07 AM Total Procedure Duration: 0 hours 8 minutes 27 seconds  Findings:      Yellow-ish/cream-colored patches overlying in the center mucosa in       patchy distribution. No esophageal obstruction to tubular esophagus       patent throughout its course.. The scope was withdrawn. Dilation was       performed with a Maloney dilator with mild resistance at 56 Fr. The       dilation site was examined following endoscope reinsertion and showed       moderate mucosal disruption. Estimated blood loss was minimal.      A medium-sized hiatal hernia was present. Single Cameron erosion       identified. Also Schatzki's ring identified seen only in the retroflexed       position. Remainder stomach appeared normal      The duodenal bulb and second portion of the duodenum were normal.       Biopsies were taken with a cold forceps for histology. Dr. Lynetta Mare       brushed for KOH Impression:               - Mucosal changes in the esophagus consistent with                            Candida esophagitis. Schatzki's ring status post  dilation followed by KOH brushing                           - Medium-sized hiatal hernia. Single Cameron                            erosion.                           - Normal duodenal bulb and second portion of the                            duodenum. Biopsied. Moderate Sedation:      Moderate (conscious) sedation was personally administered by an       anesthesia professional. The following parameters were monitored: oxygen       saturation, heart  rate, blood pressure, and response to care. Recommendation:           - Patient has a contact number available for                            emergencies. The signs and symptoms of potential                            delayed complications were discussed with the                            patient. Return to normal activities tomorrow.                            Written discharge instructions were provided to the                            patient.                           - Advance diet as tolerated.                           - Continue present medications.                           - Await pathology results.                           -Further recommendations to follow. Office visit in                            6 months Procedure Code(s):        --- Professional ---                           [phone], Esophagogastroduodenoscopy, flexible,                            transoral; with biopsy, single or multiple  43450, Dilation of esophagus, by unguided sound or                            bougie, single or multiple passes Diagnosis Code(s):        --- Professional ---                           K22.8, Other specified diseases of esophagus                           K44.9, Diaphragmatic hernia without obstruction or                            gangrene                           R13.10, Dysphagia, unspecified CPT copyright 2019 [employer]. All rights reserved. The codes documented in this report are preliminary and upon coder review may  be revised to meet current compliance requirements. Cristopher Estimable. Boyd Litaker, MD Norvel Richards, MD 12/29/2019 8:04:17 AM This report has been signed electronically. Number of Addenda: 0

## 2019-12-29 NOTE — Anesthesia Postprocedure Evaluation (Signed)
Anesthesia Post Note  Patient: Sean Thomas  Procedure(s) Performed: ESOPHAGOGASTRODUODENOSCOPY (EGD) WITH PROPOFOL (N/A ) MALONEY DILATION (N/A )  Patient location during evaluation: PACU Anesthesia Type: General Level of consciousness: awake and alert and oriented Pain management: pain level controlled Vital Signs Assessment: post-procedure vital signs reviewed and stable Respiratory status: spontaneous breathing Cardiovascular status: blood pressure returned to baseline and stable Postop Assessment: no apparent nausea or vomiting Anesthetic complications: no   No complications documented.   Last Vitals:  Vitals:   12/29/19 0815 12/29/19 0827  BP: 106/67 121/62  Pulse: 67 (!) 58  Resp: 10   Temp:  (!) 36.4 C  SpO2: 98% 96%    Last Pain:  Vitals:   12/29/19 0827  TempSrc: Oral  PainSc: 0-No pain                 Passion Lavin

## 2019-12-29 NOTE — Anesthesia Preprocedure Evaluation (Signed)
Anesthesia Evaluation  Patient identified by MRN, date of birth, ID band Patient awake    Reviewed: Allergy & Precautions, H&P , NPO status , Patient's Chart, lab work & pertinent test results, reviewed documented beta blocker date and time   Airway Mallampati: II  TM Distance: >3 FB Neck ROM: full    Dental no notable dental hx.    Pulmonary shortness of breath, COPD,  COPD inhaler, Current Smoker,    Pulmonary exam normal breath sounds clear to auscultation       Cardiovascular Exercise Tolerance: Good hypertension, negative cardio ROS   Rhythm:regular Rate:Normal     Neuro/Psych  Headaches, negative psych ROS   GI/Hepatic Neg liver ROS, GERD  Medicated,  Endo/Other  negative endocrine ROS  Renal/GU negative Renal ROS  negative genitourinary   Musculoskeletal   Abdominal   Peds  Hematology  (+) Blood dyscrasia, anemia ,   Anesthesia Other Findings   Reproductive/Obstetrics negative OB ROS                             Anesthesia Physical Anesthesia Plan  ASA: III  Anesthesia Plan: General   Post-op Pain Management:    Induction:   PONV Risk Score and Plan: 2 and Propofol infusion  Airway Management Planned:   Additional Equipment:   Intra-op Plan:   Post-operative Plan:   Informed Consent: I have reviewed the patients History and Physical, chart, labs and discussed the procedure including the risks, benefits and alternatives for the proposed anesthesia with the patient or authorized representative who has indicated his/her understanding and acceptance.     Dental Advisory Given  Plan Discussed with: CRNA  Anesthesia Plan Comments:         Anesthesia Quick Evaluation

## 2019-12-29 NOTE — Addendum Note (Signed)
Addendum  created 12/29/19 0958 by Ollen Bowl, CRNA   Flowsheet accepted, Intraprocedure Flowsheets edited

## 2019-12-31 ENCOUNTER — Other Ambulatory Visit: Payer: Self-pay

## 2019-12-31 DIAGNOSIS — D649 Anemia, unspecified: Secondary | ICD-10-CM

## 2019-12-31 MED ORDER — FLUCONAZOLE 100 MG PO TABS
ORAL_TABLET | ORAL | 0 refills | Status: DC
Start: 2019-12-31 — End: 2020-02-11

## 2020-01-02 ENCOUNTER — Encounter (HOSPITAL_COMMUNITY): Payer: Self-pay | Admitting: Internal Medicine

## 2020-01-23 ENCOUNTER — Ambulatory Visit: Payer: Medicaid Other | Admitting: Cardiovascular Disease

## 2020-01-26 ENCOUNTER — Telehealth: Payer: Self-pay | Admitting: Internal Medicine

## 2020-01-26 NOTE — Telephone Encounter (Signed)
Spoke with spoke with pt. Pt is aware of AB recommendations. Pt finished the Diflucan yesterday. Pt will continue to follow a soft diet, chew his food up as directed.

## 2020-01-26 NOTE — Telephone Encounter (Addendum)
Had candida esophagitis on EGD. Make sure he completed course of Diflucan. Follow soft foods, chew well, sit upright while eating. Please address further evaluation with Randall Hiss tomorrow, 7/13.

## 2020-01-26 NOTE — Telephone Encounter (Signed)
Spoke with pt. Pts procedure was 01/03/20 and he started having some chocking problems two days ago. Pt stated that he was eating some tomatoes and onions when he started coughing/chocking bad. Prior to that day, pt was eating without problems. Pts apt was moved up to to 02/11/20.  Please advise if pt needs to do anything different until his next ov. Please advise in the absence of EG.

## 2020-01-26 NOTE — Telephone Encounter (Signed)
Patient called and said that he is already choking again since his procedure, he is scheduled for a follow up

## 2020-02-11 ENCOUNTER — Ambulatory Visit (INDEPENDENT_AMBULATORY_CARE_PROVIDER_SITE_OTHER): Payer: Medicaid Other | Admitting: Gastroenterology

## 2020-02-11 ENCOUNTER — Encounter: Payer: Self-pay | Admitting: Gastroenterology

## 2020-02-11 ENCOUNTER — Other Ambulatory Visit: Payer: Self-pay

## 2020-02-11 VITALS — BP 122/74 | HR 105 | Temp 96.2°F | Ht 75.0 in | Wt 171.8 lb

## 2020-02-11 DIAGNOSIS — R1319 Other dysphagia: Secondary | ICD-10-CM

## 2020-02-11 DIAGNOSIS — D509 Iron deficiency anemia, unspecified: Secondary | ICD-10-CM | POA: Diagnosis not present

## 2020-02-11 DIAGNOSIS — R1310 Dysphagia, unspecified: Secondary | ICD-10-CM | POA: Diagnosis not present

## 2020-02-11 DIAGNOSIS — D649 Anemia, unspecified: Secondary | ICD-10-CM | POA: Insufficient documentation

## 2020-02-11 NOTE — Patient Instructions (Signed)
1. Please go for labs in the next one week.  2. I will work on getting colonoscopy scheduled and possible upper endoscopy if Dr. Gala Romney recommends it.

## 2020-02-11 NOTE — Progress Notes (Signed)
Primary Care Physician: Lucia Gaskins, MD  Primary Gastroenterologist:  Garfield Cornea, MD   Chief Complaint  Patient presents with   Dysphagia    HPI: Sean Thomas is a 59 y.o. male here for follow-up.  He was seen back in June for difficulty swallowing.  Initially patient had a barium pill esophagram on 12/25/2019 showed premature spillover of contrast with laryngeal penetration and aspiration of contrast down the posterior wall of the trachea.  Weak spontaneous cough reflex.  Nonobstructive cervical esophageal web.  Stricture at the GE junction, obstructing a 12.5 mm barium tablet, mucosa at the site minimally irregular.  EGD June 2021 showed Candida esophagitis, Schatzki ring status post dilation, medium sized hiatal hernia with a single Cameron lesion.  KOH positive, treated with Diflucan for 21 days.  Labs June 2021 hemoglobin 7.8, hematocrit 30, MCV 61.7, platelets 340,000, white blood cell count 8700.  We have seen the patient back in 2018 for IDA.  Attempted colonoscopy but had an inadequate prep November 2017, 72mm tubular adenoma removed but all mucosal surfaces not well seen.   Patient was scheduled June 2018 for colonoscopy but he canceled saying he did not feel like get it done.  Patient states that his swallowing was better after esophageal dilation but this only lasted for about 2 weeks.  Having trouble with foods like watermelon, banana, canned cheese.  Chews food thoroughly.  Poor detention with broken dentures.  At times strangles.  Bowel movements regular.  No blood in the stool or melena.  No abdominal pain, odynophagia, heartburn.  Appetite is good.  He has gained 10 pounds.  Does not eat a lot of meat because of poor dentition.  Has been off oxygen since 2020.  Saw pulmonary in May, recommending cardiology evaluation for presyncopal episodes.  Patient had an appointment in July but this was made to September.  He denies any associated chest pain,  diaphoresis.  Notes lightheadedness and dizziness upon standing.  Now he is careful and sits back down until symptoms resolve.  I suspect symptoms related to profound anemia.    Current Outpatient Medications  Medication Sig Dispense Refill   albuterol (PROVENTIL) (2.5 MG/3ML) 0.083% nebulizer solution Take 3 mLs (2.5 mg total) by nebulization every 6 (six) hours as needed for wheezing or shortness of breath. 75 mL 3   albuterol (VENTOLIN HFA) 108 (90 Base) MCG/ACT inhaler INHALE 2 PUFFS INTO THE LUNGS EVERY 4 HOURS AS NEEDED. (Patient taking differently: Inhale 2 puffs into the lungs every 4 (four) hours as needed for wheezing or shortness of breath. ) 8.5 g 2   Fluticasone-Umeclidin-Vilant (TRELEGY ELLIPTA) 100-62.5-25 MCG/INH AEPB Inhale 1 puff into the lungs daily.     gabapentin (NEURONTIN) 300 MG capsule Take 300 mg by mouth 3 (three) times daily.     naproxen (NAPROSYN) 500 MG tablet Take 500 mg by mouth as needed for moderate pain.      omeprazole (PRILOSEC) 20 MG capsule Take 20 mg by mouth daily.     Oxycodone HCl 10 MG TABS Take 10 mg by mouth every 6 (six) hours.      predniSONE (DELTASONE) 10 MG tablet TAKE 1 TABLET DAILY WITH BREAKFAST. (Patient taking differently: Take 10 mg by mouth daily with breakfast. ) 30 tablet 11   tamsulosin (FLOMAX) 0.4 MG CAPS capsule Take 0.4 mg by mouth daily.     traZODone (DESYREL) 100 MG tablet Take 100 mg by mouth at bedtime.  No current facility-administered medications for this visit.    Allergies as of 02/11/2020 - Review Complete 02/11/2020  Allergen Reaction Noted   Advil [ibuprofen] Diarrhea 07/20/2014   Aleve [naproxen sodium] Nausea And Vomiting 04/23/2011   Past Medical History:  Diagnosis Date   Cervical pain 11/21/2012   COPD (chronic obstructive pulmonary disease) (HCC)    DDD (degenerative disc disease)    Fracture of multiple ribs 11/01/2012   Fracture of occipital condyle ([place]) 11/21/2012   Headache     Hyperlipidemia    Hypertension    Inguinal hernia 12/19/2013   Microcytic anemia    Noted August 2017   Shortness of breath dyspnea    Past Surgical History:  Procedure Laterality Date   BACK SURGERY     COLONOSCOPY WITH PROPOFOL N/A 06/15/2016   RMR: inadequate bowel prep all mucosal surfaces not well seen. 5 mm tubular adenoma removed from the hepatic flexure. 3 mm polyp from the rectum was hyperplastic. He had scattered diverticula   ESOPHAGEAL BRUSHING  12/29/2019   Procedure: ESOPHAGEAL BRUSHING;  Surgeon: Daneil Dolin, MD;  [facility];  Service: Endoscopy;;   ESOPHAGOGASTRODUODENOSCOPY (EGD) WITH PROPOFOL N/A 06/15/2016   Dr. Gala Romney: Large hiatal hernia, reflux esophagitis, Schatzki ring with focal area of ulceration, status post dilation with 53F Maloney dilator with moderate improvement in luminal narrowing.   ESOPHAGOGASTRODUODENOSCOPY (EGD) WITH PROPOFOL N/A 12/29/2019   Procedure: ESOPHAGOGASTRODUODENOSCOPY (EGD) WITH PROPOFOL;  Surgeon: Daneil Dolin, MD;  [facility];  Service: Endoscopy;  Laterality: N/A;  9:30am, PAT telling pt to arrive at [place] Left 07/24/2014   Procedure: LAPAROSCOPIC LEFT INGUINAL HERNIA REPAIR ;  Surgeon: Ralene Ok, MD;  Location: [place];  Service: General;  Laterality: Left;   INSERTION OF MESH Left 07/24/2014   Procedure: INSERTION OF MESH;  Surgeon: Ralene Ok, MD;  Location: [place];  Service: General;  Laterality: Left;   [place] N/A 06/15/2016   Procedure: Venia Minks DILATION;  Surgeon: Daneil Dolin, MD;  [facility];  Service: Endoscopy;  Laterality: N/A;   MALONEY DILATION N/A 12/29/2019   Procedure: Venia Minks DILATION;  Surgeon: Daneil Dolin, MD;  [facility];  Service: Endoscopy;  Laterality: N/A;   POLYPECTOMY  06/15/2016   Procedure: POLYPECTOMY;  Surgeon: Daneil Dolin, MD;  [facility];  Service: Endoscopy;;   colon   right lung surgery Right    Family History  Problem Relation Age of Onset   Diabetes Mother    Alzheimer's disease Mother    Diabetes Sister    Colon cancer Neg Hx    Liver disease Neg Hx    Social History   Tobacco Use   Smoking status: Current Every Day Smoker    Packs/day: 0.50    Years: 45.00    Pack years: 22.50    Types: Cigarettes   Smokeless tobacco: Never Used  Vaping Use   Vaping Use: Never used  Substance Use Topics   Alcohol use: Yes    Alcohol/week: 9.0 standard drinks    Types: 9 Cans of beer per week    Comment: occ beer   Drug use: Yes    Types: Marijuana    Comment: former use, none in years. cocaine and marijuana in the past. uses marijuana still occas 12/24/2019    ROS:  General: Negative for anorexia, weight loss, fever, chills, positive fatigue, positive weakness. ENT:  Negative for hoarseness,  nasal congestion.  See HPI CV: Negative for chest pain, angina, palpitations, dyspnea on exertion, peripheral edema.  Respiratory: Negative for dyspnea at rest, dyspnea on exertion, cough, sputum, wheezing.  GI: See history of present illness. GU:  Negative for dysuria, hematuria, urinary incontinence, urinary frequency, nocturnal urination.  Endo: Negative for unusual weight change.    Physical Examination:   BP 122/74    Pulse 105    Temp (!) 96.2 F (35.7 C) (Temporal)    Ht 6\' 3"  (1.905 m)    Wt 171 lb 12.8 oz (77.9 kg)    BMI 21.47 kg/m   General: Chronically ill-appearing male in no acute distress.  Eyes: No icterus.  Conjunctival pale Mouth: masked Lungs: Clear to auscultation bilaterally.  Good airway movement. Heart: Regular rate and rhythm, no murmurs rubs or gallops.  Abdomen: Bowel sounds are normal, nontender, nondistended, no hepatosplenomegaly or masses, no abdominal bruits or hernia , no rebound or guarding.   Extremities: No lower extremity edema. No clubbing or deformities. Neuro: Alert and oriented x 4   Skin:  Warm and dry, no jaundice.   Psych: Alert and cooperative, normal mood and affect.  Labs:    Lab Results  Component Value Date   WBC 8.7 12/26/2019   HGB 7.8 (L) 12/26/2019   HCT 30.0 (L) 12/26/2019   MCV 64.7 (L) 12/26/2019   PLT 314 12/26/2019      Imaging Studies: No results found.  Impression/plan:  59 year old gentleman with history of esophageal dysphagia with Schatzki ring, moderate sized hiatal hernia, status post EGD with dilation in June 2021.  Noted to have Candida esophagitis at time of EGD, treated with 3 weeks of Diflucan.  Incidentally noted to have microcytic anemia.  Patient reports EGD/ED helped dysphagia about 2-3 weeks. Having problems with even soft foods sticking in chest again. Barium esophagram done prior to EGD with evidence of stricture at [facility] and nonobstructive cervical esophageal web.  He had an episode of aspiration during the study.  To discuss further with Dr. Gala Romney.  Question need for repeat EGD with dilation versus repeat barium esophagram.  Microcytic anemia with prior history of IDA several years back.  Hemoglobin last year was 14.  Hemoglobin 7.8 with MCV of 64.7 recently.  Of note patient has seen pulmonologist back in May, describing presyncopal episodes.  He continues to note episodes of lightheadedness or dizziness with standing.  Suspect symptomatic anemia. He has cardiology appointment in 03/2020. Will need colonoscopy in upcoming future. Attempted colonoscopy in 2017 was inadequate bowel prep. Patient cancelled colonoscopy in 2018. He is willing to pursue colonoscopy at this time. He is requesting EGD/ED at same time. To discuss further work up of swallowing issues with Dr. Gala Romney prior to scheduling.   Will update CBC, iron/tibc/ferritin.  ASA III

## 2020-02-12 ENCOUNTER — Other Ambulatory Visit: Payer: Self-pay | Admitting: Emergency Medicine

## 2020-02-25 ENCOUNTER — Ambulatory Visit: Payer: Medicaid Other | Admitting: Emergency Medicine

## 2020-02-26 ENCOUNTER — Telehealth: Payer: Self-pay | Admitting: Gastroenterology

## 2020-02-26 NOTE — Telephone Encounter (Signed)
Spoke with pt. Pt was notified that Treasa School, PA, discussed swallowing issues and anemia with Dr. Gala Romney. Pt is aware that Dr. Gala Romney recommends and TCS/EGD. Pt will have labs completed Monday per pt.

## 2020-02-26 NOTE — Telephone Encounter (Signed)
ASA III are done on Mondays.   Please assist with scheduling.

## 2020-02-26 NOTE — Telephone Encounter (Signed)
Please let patient know I discussed ongoing swallowing issues and anemia with Dr. Gala Romney.  Here are his recommendations.    Recommends colonoscopy/EGD/esophageal dilation with propofol. ASA III.  Diagnosis: Persistent dysphagia with history of Schatzki ring (dilate up to 60 possibly), iron deficiency anemia. NEED AS SOON AS POSSIBLE PER DR ROURK, NO MORE THAN 3 DOUBLES ON SAME DAY   Patient needs to go for my labs to follow up on iron/Hgb.

## 2020-03-01 NOTE — Telephone Encounter (Signed)
Is this a trick question?  When I am away on vacation, etc. the remaining responsible physician can do what ever needs to be done in my absence.

## 2020-03-01 NOTE — Telephone Encounter (Signed)
Please see if we can get the patient scheduled this week or next week with Dr. Abbey Chatters

## 2020-03-01 NOTE — Telephone Encounter (Signed)
Dr. Gala Romney are you ok with Dr. Abbey Chatters doing procedure this week in your absence

## 2020-03-02 ENCOUNTER — Other Ambulatory Visit: Payer: Self-pay | Admitting: Emergency Medicine

## 2020-03-02 NOTE — Telephone Encounter (Signed)
Pt is requesting predisone 10 mg lov 11/21/2019 has af/u scheduled for 1 yr

## 2020-03-02 NOTE — Telephone Encounter (Signed)
Spoke to [place] at endo, Dr. Abbey Chatters can do ASA 3 case 03/08/20 since Dr. Gala Romney is off.   Called pt, he is unable to do procedure 03/08/20 because he is at the beach and will not return till probably evening of 03/07/20.   Can procedure be scheduled next available with Dr. Gala Romney? Pt asked if procedure can be done later in September.

## 2020-03-03 NOTE — Telephone Encounter (Signed)
These procedures should be done in the next 2 weeks if patient in agreement. He has had significant change in Hgb in 12/2019.   Please encourage patient to have the labs I ordered 02/11/20 ASAP.

## 2020-03-04 ENCOUNTER — Ambulatory Visit: Payer: Medicaid Other | Admitting: Nurse Practitioner

## 2020-03-04 NOTE — Telephone Encounter (Signed)
Tried to call pt several times, received recording call can't be completed as dialed.

## 2020-03-08 NOTE — Telephone Encounter (Signed)
Tried to call pt, received recording call can't be completed as dialed. Letter mailed to patient.  FYI to Neil Crouch PA.

## 2020-03-12 NOTE — Telephone Encounter (Signed)
Noted  

## 2020-03-17 ENCOUNTER — Ambulatory Visit (INDEPENDENT_AMBULATORY_CARE_PROVIDER_SITE_OTHER): Payer: Medicaid Other | Admitting: Cardiology

## 2020-03-17 ENCOUNTER — Other Ambulatory Visit: Payer: Self-pay

## 2020-03-17 ENCOUNTER — Encounter: Payer: Self-pay | Admitting: Cardiology

## 2020-03-17 VITALS — BP 117/70 | HR 55 | Ht 74.0 in | Wt 167.0 lb

## 2020-03-17 DIAGNOSIS — D509 Iron deficiency anemia, unspecified: Secondary | ICD-10-CM

## 2020-03-17 DIAGNOSIS — R42 Dizziness and giddiness: Secondary | ICD-10-CM | POA: Diagnosis not present

## 2020-03-17 NOTE — Patient Instructions (Signed)
Medication Instructions:  Your physician recommends that you continue on your current medications as directed. Please refer to the Current Medication list given to you today.  *If you need a refill on your cardiac medications before your next appointment, please call your pharmacy*   Lab Work: None today If you have labs (blood work) drawn today and your tests are completely normal, you will receive your results only by: Marland Kitchen MyChart Message (if you have MyChart) OR . A paper copy in the mail If you have any lab test that is abnormal or we need to change your treatment, we will call you to review the results.   Testing/Procedures: None today   Follow-Up: At [facility], you and your health needs are our priority.  As part of our continuing mission to provide you with exceptional heart care, we have created designated Provider Care Teams.  These Care Teams include your primary Cardiologist (physician) and Advanced Practice Providers (APPs -  Physician Assistants and Nurse Practitioners) who all work together to provide you with the care you need, when you need it.  We recommend signing up for the patient portal called "MyChart".  Sign up information is provided on this After Visit Summary.  MyChart is used to connect with patients for Virtual Visits (Telemedicine).  Patients are able to view lab/test results, encounter notes, upcoming appointments, etc.  Non-urgent messages can be sent to your provider as well.   To learn more about what you can do with MyChart, go to [email or web address].    Your next appointment:  AS needed with Dr.McDowell      Thank you for choosing [facility] !

## 2020-03-17 NOTE — Progress Notes (Signed)
Cardiology Office Note  Date: 03/17/2020   ID: Sean Thomas, DOB October 20, 1960, MRN [id number]  PCP:  Lucia Gaskins, MD  Cardiologist:  Rozann Lesches, MD Electrophysiologist:  None   Chief Complaint  Patient presents with  . History of orthostasis    History of Present Illness: Sean Thomas is a 59 y.o. male referred for cardiology consultation by Ms. Parrett NP with Pulmonology for the evaluation of postural dizziness. His PCP is Dr. Cindie Laroche.  Initial referral was made back in May, visit got delayed since then.  We discussed his symptoms.  He states that he was experiencing lightheadedness and near syncope after standing earlier in the summer, no associated chest pain or palpitations, never a frank syncopal event.  Since that time however, he states that symptoms have improved significantly.  He does feel mildly dizzy at times, but never to the degree that he had before.  Records indicate evaluation with Gastroenterology in July, history of esophageal dysphagia and also microcytic anemia. Hemoglobin in June was down to 7.8 from 14 a few years ago. MCV 64.7.  He tells that he had an episode of what sounds like possible melena several weeks ago, has not noticed any other blood.  He is being considered for EGD and colonoscopy.  Orthostatic measurements were made in the office today and there was no significant blood pressure drop, although heart rate did increase from the 50s at rest up to the 90-100 range.  I reviewed his medications.  He has a history of chronic pain and is on Neurontin as well as oxycodone.  He does likely have some degree of autonomic dysfunction associated with this.  Past Medical History:  Diagnosis Date  . Cervical pain 11/21/2012  . COPD (chronic obstructive pulmonary disease) ([place])   . DDD (degenerative disc disease)   . Esophageal dysphagia   . Essential hypertension   . Fracture of multiple ribs 11/01/2012  . Fracture of occipital condyle ([place])  11/21/2012  . Headache   . Hyperlipidemia   . Inguinal hernia 12/19/2013  . Microcytic anemia     Past Surgical History:  Procedure Laterality Date  . BACK SURGERY    . COLONOSCOPY WITH PROPOFOL N/A 06/15/2016   RMR: inadequate bowel prep all mucosal surfaces not well seen. 5 mm tubular adenoma removed from the hepatic flexure. 3 mm polyp from the rectum was hyperplastic. He had scattered diverticula  . ESOPHAGEAL BRUSHING  12/29/2019   Procedure: ESOPHAGEAL BRUSHING;  Surgeon: Daneil Dolin, MD;  [facility];  Service: Endoscopy;;  . ESOPHAGOGASTRODUODENOSCOPY (EGD) WITH PROPOFOL N/A 06/15/2016   Dr. Gala Romney: Large hiatal hernia, reflux esophagitis, Schatzki ring with focal area of ulceration, status post dilation with 84F Maloney dilator with moderate improvement in luminal narrowing.  . ESOPHAGOGASTRODUODENOSCOPY (EGD) WITH PROPOFOL N/A 12/29/2019   Procedure: ESOPHAGOGASTRODUODENOSCOPY (EGD) WITH PROPOFOL;  Surgeon: Daneil Dolin, MD;  [facility];  Service: Endoscopy;  Laterality: N/A;  9:30am, PAT telling pt to arrive at 6:15  . INGUINAL HERNIA REPAIR Left 07/24/2014   Procedure: LAPAROSCOPIC LEFT INGUINAL HERNIA REPAIR ;  Surgeon: Ralene Ok, MD;  Location: [place];  Service: General;  Laterality: Left;  . INSERTION OF MESH Left 07/24/2014   Procedure: INSERTION OF MESH;  Surgeon: Ralene Ok, MD;  Location: [place];  Service: General;  Laterality: Left;  . LUMBAR FUSION    . MALONEY DILATION N/A 06/15/2016   Procedure: Venia Minks DILATION;  Surgeon: Daneil Dolin, MD;  [facility];  Service: Endoscopy;  Laterality: N/A;  . Venia Minks DILATION N/A 12/29/2019   Procedure: Venia Minks DILATION;  Surgeon: Daneil Dolin, MD;  [facility];  Service: Endoscopy;  Laterality: N/A;  . POLYPECTOMY  06/15/2016   Procedure: POLYPECTOMY;  Surgeon: Daneil Dolin, MD;  [facility];  Service: Endoscopy;;  colon  . right lung surgery Right      Current Outpatient Medications  Medication Sig Dispense Refill  . albuterol (PROVENTIL) (2.5 MG/3ML) 0.083% nebulizer solution Take 3 mLs (2.5 mg total) by nebulization every 6 (six) hours as needed for wheezing or shortness of breath. 75 mL 3  . Fluticasone-Umeclidin-Vilant (TRELEGY ELLIPTA) 100-62.5-25 MCG/INH AEPB Inhale 1 puff into the lungs daily.    Marland Kitchen gabapentin (NEURONTIN) 300 MG capsule Take 300 mg by mouth 3 (three) times daily.    . naproxen (NAPROSYN) 500 MG tablet Take 500 mg by mouth as needed for moderate pain.     Marland Kitchen omeprazole (PRILOSEC) 20 MG capsule Take 20 mg by mouth daily.    . Oxycodone HCl 10 MG TABS Take 10 mg by mouth every 6 (six) hours.     Marland Kitchen PROAIR HFA 108 (90 Base) MCG/ACT inhaler INHALE 2 PUFFS INTO THE LUNGS EVERY 4 HOURS AS NEEDED. 8.5 g 3  . sildenafil (VIAGRA) 100 MG tablet Take 100 mg by mouth daily.    . tamsulosin (FLOMAX) 0.4 MG CAPS capsule Take 0.4 mg by mouth daily.    . traZODone (DESYREL) 100 MG tablet Take 100 mg by mouth at bedtime.    . predniSONE (DELTASONE) 10 MG tablet TAKE 1 TABLET DAILY WITH BREAKFAST. (Patient not taking: Reported on 03/17/2020) 30 tablet 11   No current facility-administered medications for this visit.   Allergies:  Advil [ibuprofen] and Aleve [naproxen sodium]   Social History: The patient  reports that he has been smoking cigarettes. He has a 22.50 pack-year smoking history. He has never used smokeless tobacco. He reports current alcohol use of about 9.0 standard drinks of alcohol per week. He reports current drug use. Drug: Marijuana.   Family History: The patient's family history includes Alzheimer's disease in his mother; Diabetes in his mother and sister.   ROS:  Chronic pain.  Active dysphagia.  Weight loss.  Physical Exam: VS:  BP 117/70   Pulse (!) 55   Ht 6\' 2"  (1.88 m)   Wt 167 lb (75.8 kg)   SpO2 95%   BMI 21.44 kg/m , BMI Body mass index is 21.44 kg/m.  Wt Readings from Last 3 Encounters:   03/17/20 167 lb (75.8 kg)  02/11/20 171 lb 12.8 oz (77.9 kg)  12/26/19 160 lb (72.6 kg)    General: Patient appears comfortable at rest. HEENT: Conjunctiva and lids normal, wearing a mask. Neck: Supple, no elevated JVP or carotid bruits, no thyromegaly. Lungs: Clear to auscultation, nonlabored breathing at rest. Cardiac: Regular rate and rhythm with occasional ectopy, no S3 or significant systolic murmur, no pericardial rub. Abdomen: Soft, nontender, bowel sounds present. Extremities: No pitting edema, distal pulses 2+. Skin: Warm and dry. Musculoskeletal: No kyphosis. Neuropsychiatric: Alert and oriented x3, affect grossly appropriate.  ECG:  An ECG dated 12/26/2019 was personally reviewed today and demonstrated:  Sinus rhythm with PACs, nonspecific T wave changes.  Recent Labwork: 12/26/2019: Hemoglobin 7.8; Platelets 314   Other Studies Reviewed Today:  Echocardiogram 02/19/2014: Study Conclusions   - Left ventricle: The cavity size was normal. Systolic function was  normal. The estimated ejection fraction was in the range of 60%  to 65%. Wall motion was normal; there were no regional wall  motion abnormalities. Doppler parameters are consistent with  abnormal left ventricular relaxation (grade 1 diastolic  dysfunction).   Chest x-ray 11/21/2019: FINDINGS: There is evidence of old rib trauma on the left with remodeling at multiple sites. There is no edema or airspace opacity. Heart size and pulmonary vascularity are normal. Previously noted high hernia is not appreciable on this current examination. No bone lesions.  IMPRESSION: Lungs clear. Cardiac silhouette within normal limits. Old rib trauma on the left with extensive remodeling. No pneumothorax. Previously noted hiatal hernia not evident on current examination.  Assessment and Plan:  1.  History of orthostatic dizziness without syncope.  Symptoms improved of late.  This does not look to be cardiogenic,  more likely related to interval diagnosis of anemia with suspected GI blood loss and also a component of autonomic dysfunction in the setting of chronic pain with use of Neurontin and oxycodone.  He was not frankly orthostatic by blood pressure today although heart rate did increase while standing.  I recommended adequate hydration, he continues to work toward further GI evaluation for assessment of anemia as well.  No additional cardiac testing is planned at this time.  He can continue to follow with PCP.  2.  Microcytic anemia with suspected GI blood loss, undergoing active evaluation by gastroenterology with plan for colonoscopy.  He also has associated weight loss with dysphagia and plan for EGD.  Medication Adjustments/Labs and Tests Ordered: Current medicines are reviewed at length with the patient today.  Concerns regarding medicines are outlined above.   Tests Ordered: No orders of the defined types were placed in this encounter.   Medication Changes: No orders of the defined types were placed in this encounter.   Disposition:  Follow up prn  Signed, Satira Sark, MD, [facility] 03/17/2020 11:06 AM    [place] [facility] [place]. [address], [place], [place] [address] Phone: [phone]; Fax: [phone]

## 2020-04-19 ENCOUNTER — Other Ambulatory Visit: Payer: Self-pay

## 2020-04-19 ENCOUNTER — Other Ambulatory Visit: Payer: Medicaid Other

## 2020-04-19 DIAGNOSIS — Z20822 Contact with and (suspected) exposure to covid-19: Secondary | ICD-10-CM

## 2020-04-21 LAB — SARS-COV-2, NAA 2 DAY TAT

## 2020-04-21 LAB — NOVEL CORONAVIRUS, NAA: SARS-CoV-2, NAA: DETECTED — AB

## 2020-04-22 ENCOUNTER — Telehealth (HOSPITAL_COMMUNITY): Payer: Self-pay | Admitting: Adult Health

## 2020-04-22 ENCOUNTER — Telehealth (HOSPITAL_COMMUNITY): Payer: Self-pay | Admitting: Nurse Practitioner

## 2020-04-22 NOTE — Telephone Encounter (Signed)
Called and LMOM regarding monoclonal antibody treatment for COVID 19 given to those who are at risk for complications and/or hospitalization of the virus.  Patient meets criteria based on: chronic lung issues, HTN  Call back number given: [phone]  My chart message: unable to send  Sean Bihari, NP

## 2020-04-22 NOTE — Telephone Encounter (Signed)
2nd attempt to reach patient.   Called to Discuss with patient about Covid symptoms and the use of the monoclonal antibody infusion for those with mild to moderate Covid symptoms and at a high risk of hospitalization.     Pt appears to qualify for this infusion due to co-morbid conditions and/or a member of an at-risk group in accordance with the FDA Emergency Use Authorization.    Unable to reach pt - left message on voicemail requesting return call.

## 2020-10-23 ENCOUNTER — Other Ambulatory Visit: Payer: Self-pay | Admitting: Emergency Medicine

## 2020-11-01 ENCOUNTER — Other Ambulatory Visit: Payer: Self-pay

## 2020-11-01 ENCOUNTER — Emergency Department (HOSPITAL_COMMUNITY): Payer: Medicaid Other

## 2020-11-01 ENCOUNTER — Emergency Department (HOSPITAL_COMMUNITY)
Admission: EM | Admit: 2020-11-01 | Discharge: 2020-11-01 | Disposition: A | Discharge status: Home / Self Care | Payer: Medicaid Other | Attending: Emergency Medicine | Admitting: Emergency Medicine

## 2020-11-01 ENCOUNTER — Encounter (HOSPITAL_COMMUNITY): Payer: Self-pay | Admitting: *Deleted

## 2020-11-01 DIAGNOSIS — R079 Chest pain, unspecified: Secondary | ICD-10-CM | POA: Insufficient documentation

## 2020-11-01 DIAGNOSIS — Z79899 Other long term (current) drug therapy: Secondary | ICD-10-CM | POA: Diagnosis not present

## 2020-11-01 DIAGNOSIS — I10 Essential (primary) hypertension: Secondary | ICD-10-CM | POA: Insufficient documentation

## 2020-11-01 DIAGNOSIS — R093 Abnormal sputum: Secondary | ICD-10-CM | POA: Diagnosis not present

## 2020-11-01 DIAGNOSIS — R059 Cough, unspecified: Secondary | ICD-10-CM

## 2020-11-01 DIAGNOSIS — F17210 Nicotine dependence, cigarettes, uncomplicated: Secondary | ICD-10-CM | POA: Diagnosis not present

## 2020-11-01 DIAGNOSIS — R0602 Shortness of breath: Secondary | ICD-10-CM | POA: Diagnosis not present

## 2020-11-01 DIAGNOSIS — Z20822 Contact with and (suspected) exposure to covid-19: Secondary | ICD-10-CM | POA: Insufficient documentation

## 2020-11-01 DIAGNOSIS — J449 Chronic obstructive pulmonary disease, unspecified: Secondary | ICD-10-CM | POA: Diagnosis not present

## 2020-11-01 DIAGNOSIS — Z28310 Unvaccinated for covid-19: Secondary | ICD-10-CM | POA: Diagnosis not present

## 2020-11-01 LAB — CBC WITH DIFFERENTIAL/PLATELET
Abs Immature Granulocytes: 0.09 10*3/uL — ABNORMAL HIGH (ref 0.00–0.07)
Basophils Absolute: 0 10*3/uL (ref 0.0–0.1)
Basophils Relative: 0 %
Eosinophils Absolute: 0.1 10*3/uL (ref 0.0–0.5)
Eosinophils Relative: 1 %
HCT: 46 % (ref 39.0–52.0)
Hemoglobin: 13.8 g/dL (ref 13.0–17.0)
Immature Granulocytes: 1 %
Lymphocytes Relative: 11 %
Lymphs Abs: 1.9 10*3/uL (ref 0.7–4.0)
MCH: 25.8 pg — ABNORMAL LOW (ref 26.0–34.0)
MCHC: 30 g/dL (ref 30.0–36.0)
MCV: 86.1 fL (ref 80.0–100.0)
Monocytes Absolute: 0.8 10*3/uL (ref 0.1–1.0)
Monocytes Relative: 5 %
Neutro Abs: 14.5 10*3/uL — ABNORMAL HIGH (ref 1.7–7.7)
Neutrophils Relative %: 82 %
Platelets: 258 10*3/uL (ref 150–400)
RBC: 5.34 MIL/uL (ref 4.22–5.81)
RDW: 16.6 % — ABNORMAL HIGH (ref 11.5–15.5)
WBC: 17.5 10*3/uL — ABNORMAL HIGH (ref 4.0–10.5)
nRBC: 0 % (ref 0.0–0.2)

## 2020-11-01 LAB — BASIC METABOLIC PANEL
Anion gap: 8 (ref 5–15)
BUN: 8 mg/dL (ref 8–23)
CO2: 28 mmol/L (ref 22–32)
Calcium: 9 mg/dL (ref 8.9–10.3)
Chloride: 101 mmol/L (ref 98–111)
Creatinine, Ser: 0.73 mg/dL (ref 0.61–1.24)
GFR, Estimated: >60 mL/min (ref >60)
Glucose, Bld: 135 mg/dL — ABNORMAL HIGH (ref 70–99)
Potassium: 4 mmol/L (ref 3.5–5.1)
Sodium: 137 mmol/L (ref 135–145)

## 2020-11-01 LAB — RESP PANEL BY RT-PCR (FLU A&B, COVID) ARPGX2
Influenza A by PCR: NEGATIVE
Influenza B by PCR: NEGATIVE
SARS Coronavirus 2 by RT PCR: NEGATIVE

## 2020-11-01 LAB — HEPATIC FUNCTION PANEL
ALT: 11 U/L (ref 0–44)
AST: 14 U/L — ABNORMAL LOW (ref 15–41)
Albumin: 3.3 g/dL — ABNORMAL LOW (ref 3.5–5.0)
Alkaline Phosphatase: 59 U/L (ref 38–126)
Bilirubin, Direct: <0.1 mg/dL (ref 0.0–0.2)
Total Bilirubin: 0.2 mg/dL — ABNORMAL LOW (ref 0.3–1.2)
Total Protein: 6.8 g/dL (ref 6.5–8.1)

## 2020-11-01 LAB — BRAIN NATRIURETIC PEPTIDE: B Natriuretic Peptide: 89 pg/mL (ref 0.0–100.0)

## 2020-11-01 MED ORDER — IPRATROPIUM-ALBUTEROL 0.5-2.5 (3) MG/3ML IN SOLN
3.0000 mL | Freq: Once | RESPIRATORY_TRACT | Status: AC
  Administered 2020-11-01: 3 mL via RESPIRATORY_TRACT
  Filled 2020-11-01: qty 3

## 2020-11-01 MED ORDER — TRELEGY ELLIPTA 100-62.5-25 MCG/INH IN AEPB: 1.0000 | INHALATION_SPRAY | Freq: Every day | RESPIRATORY_TRACT | 0 refills | Status: DC

## 2020-11-01 MED ORDER — DOXYCYCLINE HYCLATE 100 MG PO CAPS: 100.0000 mg | ORAL_CAPSULE | Freq: Two times a day (BID) | ORAL | 0 refills | Status: DC

## 2020-11-01 MED ORDER — ALBUTEROL SULFATE HFA 108 (90 BASE) MCG/ACT IN AERS: 2.0000 | INHALATION_SPRAY | RESPIRATORY_TRACT | 0 refills | Status: DC | PRN

## 2020-11-01 MED ORDER — PREDNISONE 10 MG (21) PO TBPK: ORAL_TABLET | Freq: Every day | ORAL | 0 refills | Status: DC

## 2020-11-01 MED ORDER — METHYLPREDNISOLONE SODIUM SUCC 125 MG IJ SOLR
125.0000 mg | Freq: Once | INTRAMUSCULAR | Status: AC
  Administered 2020-11-01: 125 mg via INTRAVENOUS
  Filled 2020-11-01: qty 2

## 2020-11-01 NOTE — Discharge Instructions (Addendum)
You were seen in the ER for cough, phlegm, shortness of breath  Labs showed white count of 17.  You had no fever. Respiratory panel was negative. Chest x-ray was normal.  You received treatments in the ER and oxygen remained normal  We will treat you for bronchitis  Take doxycycline as prescribed. Take prednisone taper. Use inhalers as instructed by your lung doctor  Call your lung doctor and make an appointment for re-check in 1 week, sooner if not improving  Return for worsening or new symptoms

## 2020-11-01 NOTE — ED Triage Notes (Signed)
C/o shortness of breath, history of COPD.

## 2020-11-01 NOTE — ED Notes (Signed)
Patient transported to X-ray 
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
## 2020-11-01 NOTE — ED Notes (Signed)
Patient states he is doing an intermittent sentence in jail and does not have access to regular medication

## 2020-11-04 LAB — QUANTIFERON-TB GOLD PLUS: QuantiFERON-TB Gold Plus: UNDETERMINED — AB

## 2020-11-04 LAB — QUANTIFERON-TB GOLD PLUS (RQFGPL)
QuantiFERON Mitogen Value: 0 IU/mL
QuantiFERON Nil Value: 0.01 IU/mL
QuantiFERON TB1 Ag Value: 0 IU/mL
QuantiFERON TB2 Ag Value: 0.01 IU/mL

## 2021-03-23 ENCOUNTER — Other Ambulatory Visit: Payer: Self-pay | Admitting: Family Medicine

## 2021-03-23 DIAGNOSIS — K4090 Unilateral inguinal hernia, without obstruction or gangrene, not specified as recurrent: Secondary | ICD-10-CM

## 2021-04-05 ENCOUNTER — Ambulatory Visit (INDEPENDENT_AMBULATORY_CARE_PROVIDER_SITE_OTHER): Payer: Medicaid Other | Admitting: General Surgery

## 2021-04-05 ENCOUNTER — Other Ambulatory Visit: Payer: Self-pay

## 2021-04-05 ENCOUNTER — Encounter: Payer: Self-pay | Admitting: General Surgery

## 2021-04-05 VITALS — BP 138/84 | HR 80 | Temp 99.5°F | Resp 18 | Ht 74.0 in | Wt 163.0 lb

## 2021-04-05 DIAGNOSIS — K4090 Unilateral inguinal hernia, without obstruction or gangrene, not specified as recurrent: Secondary | ICD-10-CM

## 2021-04-05 NOTE — Progress Notes (Signed)
Sean Thomas; [id number]; [birth year]   HPI Patient is a 60 year old white male who was referred to my care by Pablo Lawrence for evaluation treatment of a right inguinal hernia.  Patient states he noticed the hernia approximately 1 month ago.  It is made worse with straining.  He does have pain at the hernia site as well as down to the right testicle.  It does reduce when lying down.  He is on chronic pain medication for his multiple back issues.  Patient has also tested positive for cocaine in the past, though he states he currently does not use cocaine. Past Medical History:  Diagnosis Date   Cervical pain 11/21/2012   COPD (chronic obstructive pulmonary disease) ([facility])    DDD (degenerative disc disease)    Esophageal dysphagia    Essential hypertension    Fracture of multiple ribs 11/01/2012   Fracture of occipital condyle ([place]) 11/21/2012   Headache    Hyperlipidemia    Inguinal hernia 12/19/2013   Microcytic anemia     Past Surgical History:  Procedure Laterality Date   BACK SURGERY     COLONOSCOPY WITH PROPOFOL N/A 06/15/2016   RMR: inadequate bowel prep all mucosal surfaces not well seen. 5 mm tubular adenoma removed from the hepatic flexure. 3 mm polyp from the rectum was hyperplastic. He had scattered diverticula   ESOPHAGEAL BRUSHING  12/29/2019   Procedure: ESOPHAGEAL BRUSHING;  Surgeon: Daneil Dolin, MD;  [facility];  Service: Endoscopy;;   ESOPHAGOGASTRODUODENOSCOPY (EGD) WITH PROPOFOL N/A 06/15/2016   Dr. Gala Romney: Large hiatal hernia, reflux esophagitis, Schatzki ring with focal area of ulceration, status post dilation with 67F Maloney dilator with moderate improvement in luminal narrowing.   ESOPHAGOGASTRODUODENOSCOPY (EGD) WITH PROPOFOL N/A 12/29/2019   Procedure: ESOPHAGOGASTRODUODENOSCOPY (EGD) WITH PROPOFOL;  Surgeon: Daneil Dolin, MD;  [facility];  Service: Endoscopy;  Laterality: N/A;  9:30am, PAT telling pt to arrive at [place] Left 07/24/2014   Procedure: LAPAROSCOPIC LEFT INGUINAL HERNIA REPAIR ;  Surgeon: Ralene Ok, MD;  Location: [place];  Service: General;  Laterality: Left;   INSERTION OF MESH Left 07/24/2014   Procedure: INSERTION OF MESH;  Surgeon: Ralene Ok, MD;  Location: [place];  Service: General;  Laterality: Left;   [place] N/A 06/15/2016   Procedure: Venia Minks DILATION;  Surgeon: Daneil Dolin, MD;  [facility];  Service: Endoscopy;  Laterality: N/A;   MALONEY DILATION N/A 12/29/2019   Procedure: Venia Minks DILATION;  Surgeon: Daneil Dolin, MD;  [facility];  Service: Endoscopy;  Laterality: N/A;   POLYPECTOMY  06/15/2016   Procedure: POLYPECTOMY;  Surgeon: Daneil Dolin, MD;  [facility];  Service: Endoscopy;;  colon   right lung surgery Right     Family History  Problem Relation Age of Onset   Diabetes Mother    Alzheimer's disease Mother    Diabetes Sister    Colon cancer Neg Hx    Liver disease Neg Hx     Current Outpatient Medications on File Prior to Visit  Medication Sig Dispense Refill   albuterol (PROAIR HFA) 108 (90 Base) MCG/ACT inhaler Inhale 2 puffs into the lungs every 4 (four) hours as needed for wheezing or shortness of breath. 18 g 0   albuterol (PROVENTIL) (2.5 MG/3ML) 0.083% nebulizer solution Take 3 mLs (2.5 mg total) by nebulization every 6 (six) hours as needed for  wheezing or shortness of breath. 75 mL 3   diazepam (VALIUM) 5 MG tablet Take 5 mg by mouth daily as needed.     Fluticasone-Umeclidin-Vilant (TRELEGY ELLIPTA) 100-62.5-25 MCG/INH AEPB Inhale 1 puff into the lungs daily. 28 each 0   gabapentin (NEURONTIN) 300 MG capsule Take 300 mg by mouth 3 (three) times daily.     omeprazole (PRILOSEC) 20 MG capsule Take 20 mg by mouth daily.     Oxycodone HCl 10 MG TABS Take 10 mg by mouth every 6 (six) hours.      tamsulosin (FLOMAX) 0.4 MG CAPS capsule Take 0.4 mg by mouth daily. (Patient not taking:  Reported on 04/05/2021)     traZODone (DESYREL) 50 MG tablet Take 150 mg by mouth at bedtime. (Patient not taking: Reported on 04/05/2021)     No current facility-administered medications on file prior to visit.    Allergies  Allergen Reactions   Advil [Ibuprofen] Diarrhea   Aleve [Naproxen Sodium] Nausea And Vomiting    diarrhea    Social History   Substance and Sexual Activity  Alcohol Use Yes   Alcohol/week: 9.0 standard drinks   Types: 9 Cans of beer per week   Comment: occ beer    Social History   Tobacco Use  Smoking Status Every Day   Packs/day: 0.50   Years: 45.00   Pack years: 22.50   Types: Cigarettes  Smokeless Tobacco Never    Review of Systems  Constitutional: Negative.   HENT: Negative.    Respiratory:  Positive for cough and shortness of breath.   Cardiovascular: Negative.   Gastrointestinal:  Positive for abdominal pain.  Genitourinary: Negative.   Musculoskeletal:  Positive for back pain, joint pain and neck pain.  Skin: Negative.   Neurological: Negative.   Endo/Heme/Allergies: Negative.   Psychiatric/Behavioral:  Positive for substance abuse. The patient is nervous/anxious.    Objective   Vitals:   04/05/21 1437  BP: 138/84  Pulse: 80  Resp: 18  Temp: 99.5 F (37.5 C)  SpO2: 95%    Physical Exam Vitals reviewed.  Constitutional:      Appearance: Normal appearance. He is normal weight. He is not ill-appearing.  HENT:     Head: Normocephalic and atraumatic.  Cardiovascular:     Rate and Rhythm: Normal rate and regular rhythm.     Heart sounds: Normal heart sounds. No murmur heard.   No gallop.  Pulmonary:     Effort: Pulmonary effort is normal. No respiratory distress.     Breath sounds: Normal breath sounds. No stridor. No wheezing, rhonchi or rales.  Abdominal:     General: Abdomen is flat. Bowel sounds are normal. There is no distension.     Palpations: Abdomen is soft. There is no mass.     Tenderness: There is no abdominal  tenderness. There is no guarding or rebound.     Hernia: A hernia is present.     Comments: Right inguinal hernia which is reducible.  Skin:    General: Skin is warm and dry.  Neurological:     Mental Status: He is alert and oriented to person, place, and time.   Primary care notes reviewed Assessment  Right inguinal hernia Chronic pain management History of cocaine positivity Plan  Patient is scheduled for a right inguinal herniorrhaphy with mesh on 04/13/2021.  The risks and benefits of the procedure including bleeding, infection, mesh use, and the possibility of recurrence of the hernia were fully explained to the patient,  who gave informed consent.  I told him that he would need to be tested for cocaine prior to the surgery.  He states he has not been doing cocaine.

## 2021-04-05 NOTE — H&P (Signed)
Sean Thomas; [id number]; [birth year]   HPI Patient is a 60 year old white male who was referred to my care by Pablo Lawrence for evaluation treatment of a right inguinal hernia.  Patient states he noticed the hernia approximately 1 month ago.  It is made worse with straining.  He does have pain at the hernia site as well as down to the right testicle.  It does reduce when lying down.  He is on chronic pain medication for his multiple back issues.  Patient has also tested positive for cocaine in the past, though he states he currently does not use cocaine. Past Medical History:  Diagnosis Date   Cervical pain 11/21/2012   COPD (chronic obstructive pulmonary disease) (HCC)    DDD (degenerative disc disease)    Esophageal dysphagia    Essential hypertension    Fracture of multiple ribs 11/01/2012   Fracture of occipital condyle ([place]) 11/21/2012   Headache    Hyperlipidemia    Inguinal hernia 12/19/2013   Microcytic anemia     Past Surgical History:  Procedure Laterality Date   BACK SURGERY     COLONOSCOPY WITH PROPOFOL N/A 06/15/2016   RMR: inadequate bowel prep all mucosal surfaces not well seen. 5 mm tubular adenoma removed from the hepatic flexure. 3 mm polyp from the rectum was hyperplastic. He had scattered diverticula   ESOPHAGEAL BRUSHING  12/29/2019   Procedure: ESOPHAGEAL BRUSHING;  Surgeon: Daneil Dolin, MD;  [facility];  Service: Endoscopy;;   ESOPHAGOGASTRODUODENOSCOPY (EGD) WITH PROPOFOL N/A 06/15/2016   Dr. Gala Romney: Large hiatal hernia, reflux esophagitis, Schatzki ring with focal area of ulceration, status post dilation with 33F Maloney dilator with moderate improvement in luminal narrowing.   ESOPHAGOGASTRODUODENOSCOPY (EGD) WITH PROPOFOL N/A 12/29/2019   Procedure: ESOPHAGOGASTRODUODENOSCOPY (EGD) WITH PROPOFOL;  Surgeon: Daneil Dolin, MD;  [facility];  Service: Endoscopy;  Laterality: N/A;  9:30am, PAT telling pt to arrive at [place] Left 07/24/2014   Procedure: LAPAROSCOPIC LEFT INGUINAL HERNIA REPAIR ;  Surgeon: Ralene Ok, MD;  Location: [place];  Service: General;  Laterality: Left;   INSERTION OF MESH Left 07/24/2014   Procedure: INSERTION OF MESH;  Surgeon: Ralene Ok, MD;  Location: [place];  Service: General;  Laterality: Left;   [place] N/A 06/15/2016   Procedure: Venia Minks DILATION;  Surgeon: Daneil Dolin, MD;  [facility];  Service: Endoscopy;  Laterality: N/A;   MALONEY DILATION N/A 12/29/2019   Procedure: Venia Minks DILATION;  Surgeon: Daneil Dolin, MD;  [facility];  Service: Endoscopy;  Laterality: N/A;   POLYPECTOMY  06/15/2016   Procedure: POLYPECTOMY;  Surgeon: Daneil Dolin, MD;  [facility];  Service: Endoscopy;;  colon   right lung surgery Right     Family History  Problem Relation Age of Onset   Diabetes Mother    Alzheimer's disease Mother    Diabetes Sister    Colon cancer Neg Hx    Liver disease Neg Hx     Current Outpatient Medications on File Prior to Visit  Medication Sig Dispense Refill   albuterol (PROAIR HFA) 108 (90 Base) MCG/ACT inhaler Inhale 2 puffs into the lungs every 4 (four) hours as needed for wheezing or shortness of breath. 18 g 0   albuterol (PROVENTIL) (2.5 MG/3ML) 0.083% nebulizer solution Take 3 mLs (2.5 mg total) by nebulization every 6 (six) hours as needed for  wheezing or shortness of breath. 75 mL 3   diazepam (VALIUM) 5 MG tablet Take 5 mg by mouth daily as needed.     Fluticasone-Umeclidin-Vilant (TRELEGY ELLIPTA) 100-62.5-25 MCG/INH AEPB Inhale 1 puff into the lungs daily. 28 each 0   gabapentin (NEURONTIN) 300 MG capsule Take 300 mg by mouth 3 (three) times daily.     omeprazole (PRILOSEC) 20 MG capsule Take 20 mg by mouth daily.     Oxycodone HCl 10 MG TABS Take 10 mg by mouth every 6 (six) hours.      tamsulosin (FLOMAX) 0.4 MG CAPS capsule Take 0.4 mg by mouth daily. (Patient not taking:  Reported on 04/05/2021)     traZODone (DESYREL) 50 MG tablet Take 150 mg by mouth at bedtime. (Patient not taking: Reported on 04/05/2021)     No current facility-administered medications on file prior to visit.    Allergies  Allergen Reactions   Advil [Ibuprofen] Diarrhea   Aleve [Naproxen Sodium] Nausea And Vomiting    diarrhea    Social History   Substance and Sexual Activity  Alcohol Use Yes   Alcohol/week: 9.0 standard drinks   Types: 9 Cans of beer per week   Comment: occ beer    Social History   Tobacco Use  Smoking Status Every Day   Packs/day: 0.50   Years: 45.00   Pack years: 22.50   Types: Cigarettes  Smokeless Tobacco Never    Review of Systems  Constitutional: Negative.   HENT: Negative.    Respiratory:  Positive for cough and shortness of breath.   Cardiovascular: Negative.   Gastrointestinal:  Positive for abdominal pain.  Genitourinary: Negative.   Musculoskeletal:  Positive for back pain, joint pain and neck pain.  Skin: Negative.   Neurological: Negative.   Endo/Heme/Allergies: Negative.   Psychiatric/Behavioral:  Positive for substance abuse. The patient is nervous/anxious.    Objective   Vitals:   04/05/21 1437  BP: 138/84  Pulse: 80  Resp: 18  Temp: 99.5 F (37.5 C)  SpO2: 95%    Physical Exam Vitals reviewed.  Constitutional:      Appearance: Normal appearance. He is normal weight. He is not ill-appearing.  HENT:     Head: Normocephalic and atraumatic.  Cardiovascular:     Rate and Rhythm: Normal rate and regular rhythm.     Heart sounds: Normal heart sounds. No murmur heard.   No gallop.  Pulmonary:     Effort: Pulmonary effort is normal. No respiratory distress.     Breath sounds: Normal breath sounds. No stridor. No wheezing, rhonchi or rales.  Abdominal:     General: Abdomen is flat. Bowel sounds are normal. There is no distension.     Palpations: Abdomen is soft. There is no mass.     Tenderness: There is no abdominal  tenderness. There is no guarding or rebound.     Hernia: A hernia is present.     Comments: Right inguinal hernia which is reducible.  Skin:    General: Skin is warm and dry.  Neurological:     Mental Status: He is alert and oriented to person, place, and time.   Primary care notes reviewed Assessment  Right inguinal hernia Chronic pain management History of cocaine positivity Plan  Patient is scheduled for a right inguinal herniorrhaphy with mesh on 04/13/2021.  The risks and benefits of the procedure including bleeding, infection, mesh use, and the possibility of recurrence of the hernia were fully explained to the patient,  who gave informed consent.  I told him that he would need to be tested for cocaine prior to the surgery.  He states he has not been doing cocaine.

## 2021-04-06 ENCOUNTER — Ambulatory Visit (INDEPENDENT_AMBULATORY_CARE_PROVIDER_SITE_OTHER): Payer: Medicaid Other | Admitting: Urology

## 2021-04-06 ENCOUNTER — Encounter: Payer: Self-pay | Admitting: Urology

## 2021-04-06 VITALS — BP 154/82 | HR 69

## 2021-04-06 DIAGNOSIS — N138 Other obstructive and reflux uropathy: Secondary | ICD-10-CM | POA: Diagnosis not present

## 2021-04-06 DIAGNOSIS — N401 Enlarged prostate with lower urinary tract symptoms: Secondary | ICD-10-CM

## 2021-04-06 DIAGNOSIS — R351 Nocturia: Secondary | ICD-10-CM | POA: Diagnosis not present

## 2021-04-06 DIAGNOSIS — R972 Elevated prostate specific antigen [PSA]: Secondary | ICD-10-CM | POA: Insufficient documentation

## 2021-04-06 LAB — URINALYSIS, ROUTINE W REFLEX MICROSCOPIC
Bilirubin, UA: NEGATIVE
Glucose, UA: NEGATIVE
Ketones, UA: NEGATIVE
Nitrite, UA: NEGATIVE
Protein,UA: NEGATIVE
RBC, UA: NEGATIVE
Specific Gravity, UA: 1.015 (ref 1.005–1.030)
Urobilinogen, Ur: 0.2 mg/dL (ref 0.2–1.0)
pH, UA: 7 (ref 5.0–7.5)

## 2021-04-06 LAB — MICROSCOPIC EXAMINATION: RBC, Urine: NONE SEEN /hpf (ref 0–2)

## 2021-04-06 MED ORDER — ALFUZOSIN HCL ER 10 MG PO TB24
10.0000 mg | ORAL_TABLET | Freq: Every day | ORAL | 11 refills | Status: DC
Start: 2021-04-06 — End: 2022-02-16

## 2021-04-06 MED ORDER — LEVOFLOXACIN 750 MG PO TABS: 750.0000 mg | ORAL_TABLET | Freq: Once | ORAL | 0 refills | Status: AC

## 2021-04-06 NOTE — Progress Notes (Signed)
post void residual=68  Urological Symptom Review  Patient is experiencing the following symptoms: Frequent urination Hard to postpone urination Get up at night to urinate Leakage of urine Stream starts and stops   Review of Systems  Gastrointestinal (upper)  : Negative for upper GI symptoms  Gastrointestinal (lower) : Negative for lower GI symptoms  Constitutional : Negative for symptoms  Skin: Negative for skin symptoms  Eyes: Negative for eye symptoms  Ear/Nose/Throat : Negative for Ear/Nose/Throat symptoms  Hematologic/Lymphatic: Negative for Hematologic/Lymphatic symptoms  Cardiovascular : Negative for cardiovascular symptoms  Respiratory : Negative for respiratory symptoms  Endocrine: Negative for endocrine symptoms  Musculoskeletal: Negative for musculoskeletal symptoms  Neurological: Negative for neurological symptoms  Psychologic: Negative for psychiatric symptoms

## 2021-04-06 NOTE — Patient Instructions (Signed)
   Appointment Time:12:30 Please arrive by 12:15 Appointment Date:05/18/2021  Location: [name]   Prostate Biopsy Instructions  Stop all aspirin or blood thinners (aspirin, plavix, coumadin, warfarin, motrin, ibuprofen, advil, aleve, naproxen, naprosyn) for 7 days prior to the procedure.  If you have any questions about stopping these medications, please contact your primary care physician or cardiologist.  Having a light meal prior to the procedure is recommended.  If you are diabetic or have low blood sugar please bring a small snack or glucose tablet.  A Fleets enema is needed to be purchased over the counter at a local pharmacy and used 2 hours before you scheduled appointment.  This can be purchased over the counter at any pharmacy.  Antibiotics will be administered in the clinic at the time of the procedure and 1 tablet has been sent to your pharmacy. Please take the antibiotic as prescribed.    Please bring someone with you to the procedure to drive you home if you are given a valium to take prior to your procedure.   If you have any questions or concerns, please feel free to call the office at [phone] or send a Mychart message.    Thank you, [facility]

## 2021-04-06 NOTE — Progress Notes (Signed)
04/06/2021 10:18 AM   [name] Dec 15, 1960 [id number]  Referring provider: Pablo Lawrence, NP [place],  [place] [id number]  Elevated PSA  HPI: Mr Sean Thomas is a 60yo here for evaluation of elevated PSA. PSA 5.8 this year. No prior PSA value. He has a hx of BPH and has been on flomax 0.4mg  daily. IPSS 25 QOL 6. Nocturia 5x. No family hx of prostate cancer. Urine stream fair. He drinks 96oz of mello-yellow daily and up to a gallon of tea daily.    PMH: Past Medical History:  Diagnosis Date   Cervical pain 11/21/2012   COPD (chronic obstructive pulmonary disease) (HCC)    DDD (degenerative disc disease)    Esophageal dysphagia    Essential hypertension    Fracture of multiple ribs 11/01/2012   Fracture of occipital condyle ([place]) 11/21/2012   Headache    Hyperlipidemia    Inguinal hernia 12/19/2013   Microcytic anemia     Surgical History: Past Surgical History:  Procedure Laterality Date   BACK SURGERY     COLONOSCOPY WITH PROPOFOL N/A 06/15/2016   RMR: inadequate bowel prep all mucosal surfaces not well seen. 5 mm tubular adenoma removed from the hepatic flexure. 3 mm polyp from the rectum was hyperplastic. He had scattered diverticula   ESOPHAGEAL BRUSHING  12/29/2019   Procedure: ESOPHAGEAL BRUSHING;  Surgeon: Daneil Dolin, MD;  [facility];  Service: Endoscopy;;   ESOPHAGOGASTRODUODENOSCOPY (EGD) WITH PROPOFOL N/A 06/15/2016   Dr. Gala Romney: Large hiatal hernia, reflux esophagitis, Schatzki ring with focal area of ulceration, status post dilation with 38F Maloney dilator with moderate improvement in luminal narrowing.   ESOPHAGOGASTRODUODENOSCOPY (EGD) WITH PROPOFOL N/A 12/29/2019   Procedure: ESOPHAGOGASTRODUODENOSCOPY (EGD) WITH PROPOFOL;  Surgeon: Daneil Dolin, MD;  [facility];  Service: Endoscopy;  Laterality: N/A;  9:30am, PAT telling pt to arrive at [place] Left 07/24/2014   Procedure: LAPAROSCOPIC  LEFT INGUINAL HERNIA REPAIR ;  Surgeon: Ralene Ok, MD;  Location: [place];  Service: General;  Laterality: Left;   INSERTION OF MESH Left 07/24/2014   Procedure: INSERTION OF MESH;  Surgeon: Ralene Ok, MD;  Location: [place];  Service: General;  Laterality: Left;   [place] N/A 06/15/2016   Procedure: Venia Minks DILATION;  Surgeon: Daneil Dolin, MD;  [facility];  Service: Endoscopy;  Laterality: N/A;   MALONEY DILATION N/A 12/29/2019   Procedure: Venia Minks DILATION;  Surgeon: Daneil Dolin, MD;  [facility];  Service: Endoscopy;  Laterality: N/A;   POLYPECTOMY  06/15/2016   Procedure: POLYPECTOMY;  Surgeon: Daneil Dolin, MD;  [facility];  Service: Endoscopy;;  colon   right lung surgery Right     Home Medications:  Allergies as of 04/06/2021       Reactions   Advil [ibuprofen] Diarrhea   Aleve [naproxen Sodium] Nausea And Vomiting   diarrhea        Medication List        Accurate as of April 06, 2021 10:18 AM. If you have any questions, ask your nurse or doctor.          albuterol (2.5 MG/3ML) 0.083% nebulizer solution Commonly known as: PROVENTIL Take 3 mLs (2.5 mg total) by nebulization every 6 (six) hours as needed for wheezing or shortness of breath.   albuterol 108 (90 Base) MCG/ACT inhaler Commonly known  as: ProAir HFA Inhale 2 puffs into the lungs every 4 (four) hours as needed for wheezing or shortness of breath.   diazepam 5 MG tablet Commonly known as: VALIUM Take 5 mg by mouth daily as needed.   FLUoxetine 10 MG capsule Commonly known as: PROZAC Take 10 mg by mouth daily.   gabapentin 300 MG capsule Commonly known as: NEURONTIN Take 300 mg by mouth 3 (three) times daily.   gabapentin 600 MG tablet Commonly known as: NEURONTIN Take 600 mg by mouth 3 (three) times daily.   meloxicam 15 MG tablet Commonly known as: MOBIC Take 15 mg by mouth daily.   omeprazole 20 MG  capsule Commonly known as: PRILOSEC Take 20 mg by mouth daily.   Oxycodone HCl 10 MG Tabs Take 10 mg by mouth every 6 (six) hours.   oxyCODONE-acetaminophen 10-325 MG tablet Commonly known as: PERCOCET Take 1 tablet by mouth 4 (four) times daily as needed.   tamsulosin 0.4 MG Caps capsule Commonly known as: FLOMAX Take 0.4 mg by mouth daily.   traZODone 50 MG tablet Commonly known as: DESYREL Take 150 mg by mouth at bedtime.   Trelegy Ellipta 100-62.5-25 MCG/INH Aepb Generic drug: Fluticasone-Umeclidin-Vilant Inhale 1 puff into the lungs daily.        Allergies:  Allergies  Allergen Reactions   Advil [Ibuprofen] Diarrhea   Aleve [Naproxen Sodium] Nausea And Vomiting    diarrhea    Family History: Family History  Problem Relation Age of Onset   Diabetes Mother    Alzheimer's disease Mother    Diabetes Sister    Colon cancer Neg Hx    Liver disease Neg Hx     Social History:  reports that he has been smoking cigarettes. He has a 22.50 pack-year smoking history. He has never used smokeless tobacco. He reports current alcohol use of about 9.0 standard drinks per week. He reports current drug use. Drug: Marijuana.  ROS: All other review of systems were reviewed and are negative except what is noted above in HPI  Physical Exam: BP (!) 154/82   Pulse 69   Constitutional:  Alert and oriented, No acute distress. HEENT: [place] AT, moist mucus membranes.  Trachea midline, no masses. Cardiovascular: No clubbing, cyanosis, or edema. Respiratory: Normal respiratory effort, no increased work of breathing. GI: Abdomen is soft, nontender, nondistended, no abdominal masses GU: No CVA tenderness. Circumcised phallus. No masses/lesions on penis, testis, scrotum. Prostate 50g smooth no nodules no induration.  Lymph: No cervical or inguinal lymphadenopathy. Skin: No rashes, bruises or suspicious lesions. Neurologic: Grossly intact, no focal deficits, moving all 4  extremities. Psychiatric: Normal mood and affect.  Laboratory Data: Lab Results  Component Value Date   WBC 17.5 (H) 11/01/2020   HGB 13.8 11/01/2020   HCT 46.0 11/01/2020   MCV 86.1 11/01/2020   PLT 258 11/01/2020    Lab Results  Component Value Date   CREATININE 0.73 11/01/2020    No results found for: PSA  No results found for: TESTOSTERONE  Lab Results  Component Value Date   HGBA1C 5.9 (H) 02/25/2016    Urinalysis    Component Value Date/Time   COLORURINE YELLOW 02/25/2016 [place] 02/25/2016 1635   LABSPEC 1.010 02/25/2016 1635   PHURINE 6.5 02/25/2016 1635   GLUCOSEU NEGATIVE 02/25/2016 1635   HGBUR NEGATIVE 02/25/2016 1635   BILIRUBINUR NEGATIVE 02/25/2016 1635   KETONESUR TRACE (A) 02/25/2016 1635   PROTEINUR 30 (A) 02/25/2016 1635   NITRITE  NEGATIVE 02/25/2016 1635   LEUKOCYTESUR NEGATIVE 02/25/2016 1635    Lab Results  Component Value Date   BACTERIA RARE (A) 02/25/2016    Pertinent Imaging:  No results found for this or any previous visit.  No results found for this or any previous visit.  No results found for this or any previous visit.  No results found for this or any previous visit.  No results found for this or any previous visit.  No results found for this or any previous visit.  No results found for this or any previous visit.  No results found for this or any previous visit.   Assessment & Plan:    1. Elevated PSA The patient and I talked about etiologies of elevated PSA.  We discussed the possible relationship between elevated PSA, prostate cancer, BPH, prostatitis, and UTI.   Conservative treatment of elevated PSA with watchful waiting was discussed with the patient.  All questions were answered.        All of the risks and benefits along with alternatives to prostate biopsy were discussed with the patient.  The patient gave fully informed consent to proceed with a transrectal ultrasound guided biopsy of  the prostate for the evaluation of their evated PSA.  Prostate biopsy instructions and antibiotics were given to the patient.  - Urinalysis, Routine w reflex microscopic - BLADDER SCAN AMB NON-IMAGING  2. BPH with urinary frequency -We will start uroxatral 10mg  qhs. Stop flomax 0.4mg  daily  No follow-ups on file.  Nicolette Bang, MD  [facility] [place]

## 2021-04-06 NOTE — Patient Instructions (Signed)
Sean Thomas  04/06/2021     @PREFPERIOPPHARMACY @   Your procedure is scheduled on  04/13/2021.   Report to [name] at  [phone] A.M.   Call this number if you have problems the morning of surgery:  [phone]   Remember:  Do not eat or drink after midnight.     Use your nebulizer and your inhalers before you come and bring your rescue inhaler with you.    Take these medicines the morning of surgery with A SIP OF WATER       valium (if needed), prozac, gabapentin, mobic, oxycodone (if needed), prilosec.     Do not wear jewelry, make-up or nail polish.  Do not wear lotions, powders, or perfumes, or deodorant.  Do not shave 48 hours prior to surgery.  Men may shave face and neck.  Do not bring valuables to the hospital.  [facility] is not responsible for any belongings or valuables.  Contacts, dentures or bridgework may not be worn into surgery.  Leave your suitcase in the car.  After surgery it may be brought to your room.  For patients admitted to the hospital, discharge time will be determined by your treatment team.  Patients discharged the day of surgery will not be allowed to drive home and must have someone with them for 24 hours.    Special instructions:   DO NOT smoke tobacco or vape fore 24 hours before your procedure.  Please read over the following fact sheets that you were given. Coughing and Deep Breathing, Surgical Site Infection Prevention, Anesthesia Post-op Instructions, and Care and Recovery After Surgery       Open Hernia Repair, Adult, Care After What can I expect after the procedure? After the procedure, it is common to have: Mild discomfort. Slight bruising. Mild swelling. Pain in the belly (abdomen). A small amount of blood from the cut from surgery (incision). Follow these instructions at home: Your doctor may give you more specific instructions. If you have problems, call your doctor. Medicines Take over-the-counter and  prescription medicines only as told by your doctor. If told, take steps to prevent problems with pooping (constipation). You may need to: Drink enough fluid to keep your pee (urine) pale yellow. Take medicines. You will be told what medicines to take. Eat foods that are high in fiber. These include beans, whole grains, and fresh fruits and vegetables. Limit foods that are high in fat and sugar. These include fried or sweet foods. Ask your doctor if you should avoid driving or using machines while you are taking your medicine. Incision care  Follow instructions from your doctor about how to take care of your incision. Make sure you: Wash your hands with soap and water for at least 20 seconds before and after you change your bandage (dressing). If you cannot use soap and water, use hand sanitizer. Change your bandage. Leave stitches or skin glue in place for at least 2 weeks. Leave tape strips alone unless you are told to take them off. You may trim the edges of the tape strips if they curl up. Check your incision every day for signs of infection. Check for: More redness, swelling, or pain. More fluid or blood. Warmth. Pus or a bad smell. Wear loose, soft clothing while your incision heals. Activity  Rest as told by your doctor. Do not lift anything that is heavier than 10 lb (4.5 kg), or the limit that you are  told. Do not play contact sports until your doctor says that this is safe. If you were given a sedative during your procedure, do not drive or use machines until your doctor says that it is safe. A sedative is a medicine that helps you relax. Return to your normal activities when your doctor says that it is safe. General instructions Do not take baths, swim, or use a hot tub. Ask your doctor about taking showers or sponge baths. Hold a pillow over your belly when you cough or sneeze. This helps with pain. Do not smoke or use any products that contain nicotine or tobacco. If you  need help quitting, ask your doctor. Keep all follow-up visits. Contact a doctor if: You have any of these signs of infection in or around your incision: More redness, swelling, or pain. More fluid or blood. Warmth. Pus. A bad smell. You have a fever or chills. You have blood in your poop (stool). You have not pooped (had a bowel movement) in 2-3 days. Medicine does not help your pain. Get help right away if: You have chest pain, or you are short of breath. You feel faint or light-headed. You have very bad pain. You vomit and your pain is worse. You have pain, swelling, or redness in a leg. These symptoms may be an emergency. Get help right away. Call your local emergency services (911 in the [place] Do not wait to see if the symptoms will go away. Do not drive yourself to the hospital. Summary After this procedure, it is common to have mild discomfort, slight bruising, and mild swelling. Follow instructions from your doctor about how to take care of your cut from surgery (incision). Check every day for signs of infection. Do not lift heavy objects or play contact sports until your doctor says it is safe. Return to your normal activities as told by your doctor. This information is not intended to replace advice given to you by your health care provider. Make sure you discuss any questions you have with your health care provider. Document Revised: 02/16/2020 Document Reviewed: 02/16/2020 Elsevier Patient Education  2022 [place] Anesthesia, Adult, Care After This sheet gives you information about how to care for yourself after your procedure. Your health care provider may also give you more specific instructions. If you have problems or questions, contact your health care provider. What can I expect after the procedure? After the procedure, the following side effects are common: Pain or discomfort at the IV site. Nausea. Vomiting. Sore throat. Trouble  concentrating. Feeling cold or chills. Feeling weak or tired. Sleepiness and fatigue. Soreness and body aches. These side effects can affect parts of the body that were not involved in surgery. Follow these instructions at home: For the time period you were told by your health care provider:  Rest. Do not participate in activities where you could fall or become injured. Do not drive or use machinery. Do not drink alcohol. Do not take sleeping pills or medicines that cause drowsiness. Do not make important decisions or sign legal documents. Do not take care of children on your own. Eating and drinking Follow any instructions from your health care provider about eating or drinking restrictions. When you feel hungry, start by eating small amounts of foods that are soft and easy to digest (bland), such as toast. Gradually return to your regular diet. Drink enough fluid to keep your urine pale yellow. If you vomit, rehydrate by drinking water, juice, or clear broth.  General instructions If you have sleep apnea, surgery and certain medicines can increase your risk for breathing problems. Follow instructions from your health care provider about wearing your sleep device: Anytime you are sleeping, including during daytime naps. While taking prescription pain medicines, sleeping medicines, or medicines that make you drowsy. Have a responsible adult stay with you for the time you are told. It is important to have someone help care for you until you are awake and alert. Return to your normal activities as told by your health care provider. Ask your health care provider what activities are safe for you. Take over-the-counter and prescription medicines only as told by your health care provider. If you smoke, do not smoke without supervision. Keep all follow-up visits as told by your health care provider. This is important. Contact a health care provider if: You have nausea or vomiting that does not  get better with medicine. You cannot eat or drink without vomiting. You have pain that does not get better with medicine. You are unable to pass urine. You develop a skin rash. You have a fever. You have redness around your IV site that gets worse. Get help right away if: You have difficulty breathing. You have chest pain. You have blood in your urine or stool, or you vomit blood. Summary After the procedure, it is common to have a sore throat or nausea. It is also common to feel tired. Have a responsible adult stay with you for the time you are told. It is important to have someone help care for you until you are awake and alert. When you feel hungry, start by eating small amounts of foods that are soft and easy to digest (bland), such as toast. Gradually return to your regular diet. Drink enough fluid to keep your urine pale yellow. Return to your normal activities as told by your health care provider. Ask your health care provider what activities are safe for you. This information is not intended to replace advice given to you by your health care provider. Make sure you discuss any questions you have with your health care provider. Document Revised: 03/18/2020 Document Reviewed: 10/16/2019 Elsevier Patient Education  2022 [place]. How to Use Chlorhexidine for Bathing Chlorhexidine gluconate (CHG) is a germ-killing (antiseptic) solution that is used to clean the skin. It can get rid of the bacteria that normally live on the skin and can keep them away for about 24 hours. To clean your skin with CHG, you may be given: A CHG solution to use in the shower or as part of a sponge bath. A prepackaged cloth that contains CHG. Cleaning your skin with CHG may help lower the risk for infection: While you are staying in the intensive care unit of the hospital. If you have a vascular access, such as a central line, to provide short-term or long-term access to your veins. If you have a catheter  to drain urine from your bladder. If you are on a ventilator. A ventilator is a machine that helps you breathe by moving air in and out of your lungs. After surgery. What are the risks? Risks of using CHG include: A skin reaction. Hearing loss, if CHG gets in your ears and you have a perforated eardrum. Eye injury, if CHG gets in your eyes and is not rinsed out. The CHG product catching fire. Make sure that you avoid smoking and flames after applying CHG to your skin. Do not use CHG: If you have a chlorhexidine allergy  or have previously reacted to chlorhexidine. On babies younger than 31 months of age. How to use CHG solution Use CHG only as told by your health care provider, and follow the instructions on the label. Use the full amount of CHG as directed. Usually, this is one bottle. During a shower Follow these steps when using CHG solution during a shower (unless your health care provider gives you different instructions): Start the shower. Use your normal soap and shampoo to wash your face and hair. Turn off the shower or move out of the shower stream. Pour the CHG onto a clean washcloth. Do not use any type of brush or rough-edged sponge. Starting at your neck, lather your body down to your toes. Make sure you follow these instructions: If you will be having surgery, pay special attention to the part of your body where you will be having surgery. Scrub this area for at least 1 minute. Do not use CHG on your head or face. If the solution gets into your ears or eyes, rinse them well with water. Avoid your genital area. Avoid any areas of skin that have broken skin, cuts, or scrapes. Scrub your back and under your arms. Make sure to wash skin folds. Let the lather sit on your skin for 1-2 minutes or as long as told by your health care provider. Thoroughly rinse your entire body in the shower. Make sure that all body creases and crevices are rinsed well. Dry off with a clean towel. Do  not put any substances on your body afterward--such as powder, lotion, or perfume--unless you are told to do so by your health care provider. Only use lotions that are recommended by the manufacturer. Put on clean clothes or pajamas. If it is the night before your surgery, sleep in clean sheets.  During a sponge bath Follow these steps when using CHG solution during a sponge bath (unless your health care provider gives you different instructions): Use your normal soap and shampoo to wash your face and hair. Pour the CHG onto a clean washcloth. Starting at your neck, lather your body down to your toes. Make sure you follow these instructions: If you will be having surgery, pay special attention to the part of your body where you will be having surgery. Scrub this area for at least 1 minute. Do not use CHG on your head or face. If the solution gets into your ears or eyes, rinse them well with water. Avoid your genital area. Avoid any areas of skin that have broken skin, cuts, or scrapes. Scrub your back and under your arms. Make sure to wash skin folds. Let the lather sit on your skin for 1-2 minutes or as long as told by your health care provider. Using a different clean, wet washcloth, thoroughly rinse your entire body. Make sure that all body creases and crevices are rinsed well. Dry off with a clean towel. Do not put any substances on your body afterward--such as powder, lotion, or perfume--unless you are told to do so by your health care provider. Only use lotions that are recommended by the manufacturer. Put on clean clothes or pajamas. If it is the night before your surgery, sleep in clean sheets. How to use CHG prepackaged cloths Only use CHG cloths as told by your health care provider, and follow the instructions on the label. Use the CHG cloth on clean, dry skin. Do not use the CHG cloth on your head or face unless your health care provider  tells you to. When washing with the CHG  cloth: Avoid your genital area. Avoid any areas of skin that have broken skin, cuts, or scrapes. Before surgery Follow these steps when using a CHG cloth to clean before surgery (unless your health care provider gives you different instructions): Using the CHG cloth, vigorously scrub the part of your body where you will be having surgery. Scrub using a back-and-forth motion for 3 minutes. The area on your body should be completely wet with CHG when you are done scrubbing. Do not rinse. Discard the cloth and let the area air-dry. Do not put any substances on the area afterward, such as powder, lotion, or perfume. Put on clean clothes or pajamas. If it is the night before your surgery, sleep in clean sheets.  For general bathing Follow these steps when using CHG cloths for general bathing (unless your health care provider gives you different instructions). Use a separate CHG cloth for each area of your body. Make sure you wash between any folds of skin and between your fingers and toes. Wash your body in the following order, switching to a new cloth after each step: The front of your neck, shoulders, and chest. Both of your arms, under your arms, and your hands. Your stomach and groin area, avoiding the genitals. Your right leg and foot. Your left leg and foot. The back of your neck, your back, and your buttocks. Do not rinse. Discard the cloth and let the area air-dry. Do not put any substances on your body afterward--such as powder, lotion, or perfume--unless you are told to do so by your health care provider. Only use lotions that are recommended by the manufacturer. Put on clean clothes or pajamas. Contact a health care provider if: Your skin gets irritated after scrubbing. You have questions about using your solution or cloth. You swallow any chlorhexidine. Call your local poison control center (1-(579)537-3648 in the [place] Get help right away if: Your eyes itch badly, or they become  very red or swollen. Your skin itches badly and is red or swollen. Your hearing changes. You have trouble seeing. You have swelling or tingling in your mouth or throat. You have trouble breathing. These symptoms may represent a serious problem that is an emergency. Do not wait to see if the symptoms will go away. Get medical help right away. Call your local emergency services (911 in the [place] Do not drive yourself to the hospital. Summary Chlorhexidine gluconate (CHG) is a germ-killing (antiseptic) solution that is used to clean the skin. Cleaning your skin with CHG may help to lower your risk for infection. You may be given CHG to use for bathing. It may be in a bottle or in a prepackaged cloth to use on your skin. Carefully follow your health care provider's instructions and the instructions on the product label. Do not use CHG if you have a chlorhexidine allergy. Contact your health care provider if your skin gets irritated after scrubbing. This information is not intended to replace advice given to you by your health care provider. Make sure you discuss any questions you have with your health care provider. Document Revised: 09/13/2020 Document Reviewed: 09/13/2020 Elsevier Patient Education  2022 [employer].

## 2021-04-11 ENCOUNTER — Other Ambulatory Visit: Payer: Self-pay

## 2021-04-11 ENCOUNTER — Encounter (HOSPITAL_COMMUNITY)
Admission: RE | Admit: 2021-04-11 | Discharge: 2021-04-11 | Disposition: A | Discharge status: Home / Self Care | Payer: Medicaid Other | Source: Ambulatory Visit | Attending: General Surgery | Admitting: General Surgery

## 2021-04-11 DIAGNOSIS — Z01812 Encounter for preprocedural laboratory examination: Secondary | ICD-10-CM | POA: Insufficient documentation

## 2021-04-11 LAB — CBC WITH DIFFERENTIAL/PLATELET
Abs Immature Granulocytes: 0.04 10*3/uL (ref 0.00–0.07)
Basophils Absolute: 0.1 10*3/uL (ref 0.0–0.1)
Basophils Relative: 1 %
Eosinophils Absolute: 0.3 10*3/uL (ref 0.0–0.5)
Eosinophils Relative: 3 %
HCT: 45.8 % (ref 39.0–52.0)
Hemoglobin: 13.7 g/dL (ref 13.0–17.0)
Immature Granulocytes: 0 %
Lymphocytes Relative: 35 %
Lymphs Abs: 3.4 10*3/uL (ref 0.7–4.0)
MCH: 26.4 pg (ref 26.0–34.0)
MCHC: 29.9 g/dL — ABNORMAL LOW (ref 30.0–36.0)
MCV: 88.4 fL (ref 80.0–100.0)
Monocytes Absolute: 0.7 10*3/uL (ref 0.1–1.0)
Monocytes Relative: 7 %
Neutro Abs: 5.3 10*3/uL (ref 1.7–7.7)
Neutrophils Relative %: 54 %
Platelets: 253 10*3/uL (ref 150–400)
RBC: 5.18 MIL/uL (ref 4.22–5.81)
RDW: 17.2 % — ABNORMAL HIGH (ref 11.5–15.5)
WBC: 9.7 10*3/uL (ref 4.0–10.5)
nRBC: 0 % (ref 0.0–0.2)

## 2021-04-11 LAB — RAPID URINE DRUG SCREEN, HOSP PERFORMED
Amphetamines: NOT DETECTED
Barbiturates: NOT DETECTED
Benzodiazepines: POSITIVE — AB
Cocaine: POSITIVE — AB
Opiates: NOT DETECTED
Tetrahydrocannabinol: NOT DETECTED

## 2021-04-15 NOTE — Patient Instructions (Signed)
Sean Thomas  04/15/2021     @PREFPERIOPPHARMACY @   Your procedure is scheduled on 04/20/2021.   Report to [name] at  [phone]  A.M.   Call this number if you have problems the morning of surgery:  [phone]   Remember:  Do not eat or drink after midnight.    Use your nebulizer and your inhaler before you come and bring your rescue inhaler with you.      Take these medicines the morning of surgery with A SIP OF WATER      valium, prozac, gabapentin, mobic or oxycodone (if needed), omeprazole, flomax.     Do not wear jewelry, make-up or nail polish.  Do not wear lotions, powders, or perfumes, or deodorant.  Do not shave 48 hours prior to surgery.  Men may shave face and neck.  Do not bring valuables to the hospital.  [facility] is not responsible for any belongings or valuables.  Contacts, dentures or bridgework may not be worn into surgery.  Leave your suitcase in the car.  After surgery it may be brought to your room.  For patients admitted to the hospital, discharge time will be determined by your treatment team.  Patients discharged the day of surgery will not be allowed to drive home and must have someone with them for 24 hours.    Special instructions:   DO NOT smoke tobacco or vape for 24 hours before your procedure.  Please read over the following fact sheets that you were given. Coughing and Deep Breathing, Surgical Site Infection Prevention, Anesthesia Post-op Instructions, and Care and Recovery After Surgery      Open Hernia Repair, Adult, Care After What can I expect after the procedure? After the procedure, it is common to have: Mild discomfort. Slight bruising. Mild swelling. Pain in the belly (abdomen). A small amount of blood from the cut from surgery (incision). Follow these instructions at home: Your doctor may give you more specific instructions. If you have problems, call your doctor. Medicines Take over-the-counter and  prescription medicines only as told by your doctor. If told, take steps to prevent problems with pooping (constipation). You may need to: Drink enough fluid to keep your pee (urine) pale yellow. Take medicines. You will be told what medicines to take. Eat foods that are high in fiber. These include beans, whole grains, and fresh fruits and vegetables. Limit foods that are high in fat and sugar. These include fried or sweet foods. Ask your doctor if you should avoid driving or using machines while you are taking your medicine. Incision care  Follow instructions from your doctor about how to take care of your incision. Make sure you: Wash your hands with soap and water for at least 20 seconds before and after you change your bandage (dressing). If you cannot use soap and water, use hand sanitizer. Change your bandage. Leave stitches or skin glue in place for at least 2 weeks. Leave tape strips alone unless you are told to take them off. You may trim the edges of the tape strips if they curl up. Check your incision every day for signs of infection. Check for: More redness, swelling, or pain. More fluid or blood. Warmth. Pus or a bad smell. Wear loose, soft clothing while your incision heals. Activity  Rest as told by your doctor. Do not lift anything that is heavier than 10 lb (4.5 kg), or the limit that you are told.  Do not play contact sports until your doctor says that this is safe. If you were given a sedative during your procedure, do not drive or use machines until your doctor says that it is safe. A sedative is a medicine that helps you relax. Return to your normal activities when your doctor says that it is safe. General instructions Do not take baths, swim, or use a hot tub. Ask your doctor about taking showers or sponge baths. Hold a pillow over your belly when you cough or sneeze. This helps with pain. Do not smoke or use any products that contain nicotine or tobacco. If you  need help quitting, ask your doctor. Keep all follow-up visits. Contact a doctor if: You have any of these signs of infection in or around your incision: More redness, swelling, or pain. More fluid or blood. Warmth. Pus. A bad smell. You have a fever or chills. You have blood in your poop (stool). You have not pooped (had a bowel movement) in 2-3 days. Medicine does not help your pain. Get help right away if: You have chest pain, or you are short of breath. You feel faint or light-headed. You have very bad pain. You vomit and your pain is worse. You have pain, swelling, or redness in a leg. These symptoms may be an emergency. Get help right away. Call your local emergency services (911 in the [place] Do not wait to see if the symptoms will go away. Do not drive yourself to the hospital. Summary After this procedure, it is common to have mild discomfort, slight bruising, and mild swelling. Follow instructions from your doctor about how to take care of your cut from surgery (incision). Check every day for signs of infection. Do not lift heavy objects or play contact sports until your doctor says it is safe. Return to your normal activities as told by your doctor. This information is not intended to replace advice given to you by your health care provider. Make sure you discuss any questions you have with your health care provider. Document Revised: 02/16/2020 Document Reviewed: 02/16/2020 Elsevier Patient Education  2022 [place] Anesthesia, Adult, Care After This sheet gives you information about how to care for yourself after your procedure. Your health care provider may also give you more specific instructions. If you have problems or questions, contact your health care provider. What can I expect after the procedure? After the procedure, the following side effects are common: Pain or discomfort at the IV site. Nausea. Vomiting. Sore throat. Trouble  concentrating. Feeling cold or chills. Feeling weak or tired. Sleepiness and fatigue. Soreness and body aches. These side effects can affect parts of the body that were not involved in surgery. Follow these instructions at home: For the time period you were told by your health care provider:  Rest. Do not participate in activities where you could fall or become injured. Do not drive or use machinery. Do not drink alcohol. Do not take sleeping pills or medicines that cause drowsiness. Do not make important decisions or sign legal documents. Do not take care of children on your own. Eating and drinking Follow any instructions from your health care provider about eating or drinking restrictions. When you feel hungry, start by eating small amounts of foods that are soft and easy to digest (bland), such as toast. Gradually return to your regular diet. Drink enough fluid to keep your urine pale yellow. If you vomit, rehydrate by drinking water, juice, or clear broth. General  instructions If you have sleep apnea, surgery and certain medicines can increase your risk for breathing problems. Follow instructions from your health care provider about wearing your sleep device: Anytime you are sleeping, including during daytime naps. While taking prescription pain medicines, sleeping medicines, or medicines that make you drowsy. Have a responsible adult stay with you for the time you are told. It is important to have someone help care for you until you are awake and alert. Return to your normal activities as told by your health care provider. Ask your health care provider what activities are safe for you. Take over-the-counter and prescription medicines only as told by your health care provider. If you smoke, do not smoke without supervision. Keep all follow-up visits as told by your health care provider. This is important. Contact a health care provider if: You have nausea or vomiting that does not  get better with medicine. You cannot eat or drink without vomiting. You have pain that does not get better with medicine. You are unable to pass urine. You develop a skin rash. You have a fever. You have redness around your IV site that gets worse. Get help right away if: You have difficulty breathing. You have chest pain. You have blood in your urine or stool, or you vomit blood. Summary After the procedure, it is common to have a sore throat or nausea. It is also common to feel tired. Have a responsible adult stay with you for the time you are told. It is important to have someone help care for you until you are awake and alert. When you feel hungry, start by eating small amounts of foods that are soft and easy to digest (bland), such as toast. Gradually return to your regular diet. Drink enough fluid to keep your urine pale yellow. Return to your normal activities as told by your health care provider. Ask your health care provider what activities are safe for you. This information is not intended to replace advice given to you by your health care provider. Make sure you discuss any questions you have with your health care provider. Document Revised: 03/18/2020 Document Reviewed: 10/16/2019 Elsevier Patient Education  2022 [place]. How to Use Chlorhexidine for Bathing Chlorhexidine gluconate (CHG) is a germ-killing (antiseptic) solution that is used to clean the skin. It can get rid of the bacteria that normally live on the skin and can keep them away for about 24 hours. To clean your skin with CHG, you may be given: A CHG solution to use in the shower or as part of a sponge bath. A prepackaged cloth that contains CHG. Cleaning your skin with CHG may help lower the risk for infection: While you are staying in the intensive care unit of the hospital. If you have a vascular access, such as a central line, to provide short-term or long-term access to your veins. If you have a catheter  to drain urine from your bladder. If you are on a ventilator. A ventilator is a machine that helps you breathe by moving air in and out of your lungs. After surgery. What are the risks? Risks of using CHG include: A skin reaction. Hearing loss, if CHG gets in your ears and you have a perforated eardrum. Eye injury, if CHG gets in your eyes and is not rinsed out. The CHG product catching fire. Make sure that you avoid smoking and flames after applying CHG to your skin. Do not use CHG: If you have a chlorhexidine allergy or  have previously reacted to chlorhexidine. On babies younger than 66 months of age. How to use CHG solution Use CHG only as told by your health care provider, and follow the instructions on the label. Use the full amount of CHG as directed. Usually, this is one bottle. During a shower Follow these steps when using CHG solution during a shower (unless your health care provider gives you different instructions): Start the shower. Use your normal soap and shampoo to wash your face and hair. Turn off the shower or move out of the shower stream. Pour the CHG onto a clean washcloth. Do not use any type of brush or rough-edged sponge. Starting at your neck, lather your body down to your toes. Make sure you follow these instructions: If you will be having surgery, pay special attention to the part of your body where you will be having surgery. Scrub this area for at least 1 minute. Do not use CHG on your head or face. If the solution gets into your ears or eyes, rinse them well with water. Avoid your genital area. Avoid any areas of skin that have broken skin, cuts, or scrapes. Scrub your back and under your arms. Make sure to wash skin folds. Let the lather sit on your skin for 1-2 minutes or as long as told by your health care provider. Thoroughly rinse your entire body in the shower. Make sure that all body creases and crevices are rinsed well. Dry off with a clean towel. Do  not put any substances on your body afterward--such as powder, lotion, or perfume--unless you are told to do so by your health care provider. Only use lotions that are recommended by the manufacturer. Put on clean clothes or pajamas. If it is the night before your surgery, sleep in clean sheets.  During a sponge bath Follow these steps when using CHG solution during a sponge bath (unless your health care provider gives you different instructions): Use your normal soap and shampoo to wash your face and hair. Pour the CHG onto a clean washcloth. Starting at your neck, lather your body down to your toes. Make sure you follow these instructions: If you will be having surgery, pay special attention to the part of your body where you will be having surgery. Scrub this area for at least 1 minute. Do not use CHG on your head or face. If the solution gets into your ears or eyes, rinse them well with water. Avoid your genital area. Avoid any areas of skin that have broken skin, cuts, or scrapes. Scrub your back and under your arms. Make sure to wash skin folds. Let the lather sit on your skin for 1-2 minutes or as long as told by your health care provider. Using a different clean, wet washcloth, thoroughly rinse your entire body. Make sure that all body creases and crevices are rinsed well. Dry off with a clean towel. Do not put any substances on your body afterward--such as powder, lotion, or perfume--unless you are told to do so by your health care provider. Only use lotions that are recommended by the manufacturer. Put on clean clothes or pajamas. If it is the night before your surgery, sleep in clean sheets. How to use CHG prepackaged cloths Only use CHG cloths as told by your health care provider, and follow the instructions on the label. Use the CHG cloth on clean, dry skin. Do not use the CHG cloth on your head or face unless your health care provider tells  you to. When washing with the CHG  cloth: Avoid your genital area. Avoid any areas of skin that have broken skin, cuts, or scrapes. Before surgery Follow these steps when using a CHG cloth to clean before surgery (unless your health care provider gives you different instructions): Using the CHG cloth, vigorously scrub the part of your body where you will be having surgery. Scrub using a back-and-forth motion for 3 minutes. The area on your body should be completely wet with CHG when you are done scrubbing. Do not rinse. Discard the cloth and let the area air-dry. Do not put any substances on the area afterward, such as powder, lotion, or perfume. Put on clean clothes or pajamas. If it is the night before your surgery, sleep in clean sheets.  For general bathing Follow these steps when using CHG cloths for general bathing (unless your health care provider gives you different instructions). Use a separate CHG cloth for each area of your body. Make sure you wash between any folds of skin and between your fingers and toes. Wash your body in the following order, switching to a new cloth after each step: The front of your neck, shoulders, and chest. Both of your arms, under your arms, and your hands. Your stomach and groin area, avoiding the genitals. Your right leg and foot. Your left leg and foot. The back of your neck, your back, and your buttocks. Do not rinse. Discard the cloth and let the area air-dry. Do not put any substances on your body afterward--such as powder, lotion, or perfume--unless you are told to do so by your health care provider. Only use lotions that are recommended by the manufacturer. Put on clean clothes or pajamas. Contact a health care provider if: Your skin gets irritated after scrubbing. You have questions about using your solution or cloth. You swallow any chlorhexidine. Call your local poison control center (1-(678)519-5762 in the [place] Get help right away if: Your eyes itch badly, or they become  very red or swollen. Your skin itches badly and is red or swollen. Your hearing changes. You have trouble seeing. You have swelling or tingling in your mouth or throat. You have trouble breathing. These symptoms may represent a serious problem that is an emergency. Do not wait to see if the symptoms will go away. Get medical help right away. Call your local emergency services (911 in the [place] Do not drive yourself to the hospital. Summary Chlorhexidine gluconate (CHG) is a germ-killing (antiseptic) solution that is used to clean the skin. Cleaning your skin with CHG may help to lower your risk for infection. You may be given CHG to use for bathing. It may be in a bottle or in a prepackaged cloth to use on your skin. Carefully follow your health care provider's instructions and the instructions on the product label. Do not use CHG if you have a chlorhexidine allergy. Contact your health care provider if your skin gets irritated after scrubbing. This information is not intended to replace advice given to you by your health care provider. Make sure you discuss any questions you have with your health care provider. Document Revised: 09/13/2020 Document Reviewed: 09/13/2020 Elsevier Patient Education  2022 [employer].

## 2021-04-18 ENCOUNTER — Encounter (HOSPITAL_COMMUNITY)
Admission: RE | Admit: 2021-04-18 | Discharge: 2021-04-18 | Disposition: A | Discharge status: Home / Self Care | Payer: Medicaid Other | Source: Ambulatory Visit | Attending: General Surgery | Admitting: General Surgery

## 2021-04-18 ENCOUNTER — Encounter (HOSPITAL_COMMUNITY): Payer: Self-pay

## 2021-04-18 NOTE — Pre-Procedure Instructions (Signed)
Interoffice message to Jackson at [facility] because patient was a no show for his lab test.

## 2021-04-19 NOTE — Progress Notes (Signed)
1400 Notified Dr. Arnoldo Morale that patient did not show up for urine drug screen on 04/18/2021.  Dr. Arnoldo Morale said to cancel him.

## 2021-04-20 ENCOUNTER — Ambulatory Visit (HOSPITAL_COMMUNITY): Admission: RE | Admit: 2021-04-20 | Payer: Medicaid Other | Source: Home / Self Care | Admitting: General Surgery

## 2021-04-20 ENCOUNTER — Encounter (HOSPITAL_COMMUNITY): Admission: RE | Payer: Self-pay | Source: Home / Self Care

## 2021-04-20 SURGERY — REPAIR, HERNIA, INGUINAL, ADULT
Anesthesia: General | Laterality: Right

## 2021-05-18 ENCOUNTER — Other Ambulatory Visit: Payer: Medicaid Other | Admitting: Urology

## 2021-05-18 ENCOUNTER — Encounter (HOSPITAL_COMMUNITY): Payer: Self-pay

## 2021-05-18 ENCOUNTER — Ambulatory Visit (HOSPITAL_COMMUNITY): Payer: Medicaid Other | Attending: Urology

## 2021-05-25 ENCOUNTER — Ambulatory Visit: Payer: Medicaid Other | Admitting: Urology

## 2021-05-30 ENCOUNTER — Other Ambulatory Visit (HOSPITAL_COMMUNITY): Payer: Self-pay | Admitting: Adult Health Nurse Practitioner

## 2021-05-30 ENCOUNTER — Encounter: Payer: Self-pay | Admitting: *Deleted

## 2021-05-30 ENCOUNTER — Ambulatory Visit (HOSPITAL_COMMUNITY)
Admission: RE | Admit: 2021-05-30 | Discharge: 2021-05-30 | Disposition: A | Discharge status: Home / Self Care | Payer: Medicaid Other | Source: Ambulatory Visit | Attending: Adult Health Nurse Practitioner | Admitting: Adult Health Nurse Practitioner

## 2021-05-30 ENCOUNTER — Other Ambulatory Visit: Payer: Self-pay

## 2021-05-30 DIAGNOSIS — J418 Mixed simple and mucopurulent chronic bronchitis: Secondary | ICD-10-CM | POA: Diagnosis not present

## 2021-07-28 ENCOUNTER — Emergency Department (HOSPITAL_COMMUNITY): Payer: Medicaid Other

## 2021-07-28 ENCOUNTER — Encounter (HOSPITAL_COMMUNITY): Payer: Self-pay | Admitting: Emergency Medicine

## 2021-07-28 ENCOUNTER — Emergency Department (HOSPITAL_COMMUNITY)
Admission: EM | Admit: 2021-07-28 | Discharge: 2021-07-28 | Disposition: A | Discharge status: Home / Self Care | Payer: Medicaid Other | Attending: Emergency Medicine | Admitting: Emergency Medicine

## 2021-07-28 ENCOUNTER — Other Ambulatory Visit: Payer: Self-pay

## 2021-07-28 DIAGNOSIS — R1031 Right lower quadrant pain: Secondary | ICD-10-CM | POA: Insufficient documentation

## 2021-07-28 DIAGNOSIS — Z7951 Long term (current) use of inhaled steroids: Secondary | ICD-10-CM | POA: Diagnosis not present

## 2021-07-28 DIAGNOSIS — D72829 Elevated white blood cell count, unspecified: Secondary | ICD-10-CM | POA: Insufficient documentation

## 2021-07-28 DIAGNOSIS — I10 Essential (primary) hypertension: Secondary | ICD-10-CM | POA: Insufficient documentation

## 2021-07-28 DIAGNOSIS — R109 Unspecified abdominal pain: Secondary | ICD-10-CM

## 2021-07-28 DIAGNOSIS — J449 Chronic obstructive pulmonary disease, unspecified: Secondary | ICD-10-CM | POA: Insufficient documentation

## 2021-07-28 LAB — CBC WITH DIFFERENTIAL/PLATELET
Abs Immature Granulocytes: 0.03 10*3/uL (ref 0.00–0.07)
Basophils Absolute: 0.1 10*3/uL (ref 0.0–0.1)
Basophils Relative: 1 %
Eosinophils Absolute: 0.5 10*3/uL (ref 0.0–0.5)
Eosinophils Relative: 5 %
HCT: 43.6 % (ref 39.0–52.0)
Hemoglobin: 13.7 g/dL (ref 13.0–17.0)
Immature Granulocytes: 0 %
Lymphocytes Relative: 43 %
Lymphs Abs: 4.8 10*3/uL — ABNORMAL HIGH (ref 0.7–4.0)
MCH: 27.7 pg (ref 26.0–34.0)
MCHC: 31.4 g/dL (ref 30.0–36.0)
MCV: 88.1 fL (ref 80.0–100.0)
Monocytes Absolute: 0.9 10*3/uL (ref 0.1–1.0)
Monocytes Relative: 8 %
Neutro Abs: 5 10*3/uL (ref 1.7–7.7)
Neutrophils Relative %: 43 %
Platelets: 229 10*3/uL (ref 150–400)
RBC: 4.95 MIL/uL (ref 4.22–5.81)
RDW: 14.7 % (ref 11.5–15.5)
WBC: 11.4 10*3/uL — ABNORMAL HIGH (ref 4.0–10.5)
nRBC: 0 % (ref 0.0–0.2)

## 2021-07-28 LAB — LIPASE, BLOOD: Lipase: 53 U/L — ABNORMAL HIGH (ref 11–51)

## 2021-07-28 LAB — COMPREHENSIVE METABOLIC PANEL
ALT: 10 U/L (ref 0–44)
AST: 14 U/L — ABNORMAL LOW (ref 15–41)
Albumin: 3.6 g/dL (ref 3.5–5.0)
Alkaline Phosphatase: 57 U/L (ref 38–126)
Anion gap: 7 (ref 5–15)
BUN: 9 mg/dL (ref 8–23)
CO2: 24 mmol/L (ref 22–32)
Calcium: 8.8 mg/dL — ABNORMAL LOW (ref 8.9–10.3)
Chloride: 103 mmol/L (ref 98–111)
Creatinine, Ser: 0.65 mg/dL (ref 0.61–1.24)
GFR, Estimated: >60 mL/min (ref >60)
Glucose, Bld: 122 mg/dL — ABNORMAL HIGH (ref 70–99)
Potassium: 3.8 mmol/L (ref 3.5–5.1)
Sodium: 134 mmol/L — ABNORMAL LOW (ref 135–145)
Total Bilirubin: 0.1 mg/dL — ABNORMAL LOW (ref 0.3–1.2)
Total Protein: 6.5 g/dL (ref 6.5–8.1)

## 2021-07-28 LAB — URINALYSIS, ROUTINE W REFLEX MICROSCOPIC
Bilirubin Urine: NEGATIVE
Glucose, UA: NEGATIVE mg/dL
Hgb urine dipstick: NEGATIVE
Ketones, ur: NEGATIVE mg/dL
Leukocytes,Ua: NEGATIVE
Nitrite: NEGATIVE
Protein, ur: NEGATIVE mg/dL
Specific Gravity, Urine: 1.011 (ref 1.005–1.030)
pH: 5 (ref 5.0–8.0)

## 2021-07-28 MED ORDER — SODIUM CHLORIDE 0.9 % IV BOLUS
1000.0000 mL | Freq: Once | INTRAVENOUS | Status: AC
  Administered 2021-07-28: 1000 mL via INTRAVENOUS

## 2021-07-28 MED ORDER — OXYCODONE-ACETAMINOPHEN 5-325 MG PO TABS
1.0000 | ORAL_TABLET | Freq: Once | ORAL | Status: AC
  Administered 2021-07-28: 1 via ORAL
  Filled 2021-07-28: qty 1

## 2021-07-28 MED ORDER — IOHEXOL 300 MG/ML  SOLN
100.0000 mL | Freq: Once | INTRAMUSCULAR | Status: AC | PRN
  Administered 2021-07-28: 100 mL via INTRAVENOUS

## 2021-07-28 MED ORDER — HYDROCODONE-ACETAMINOPHEN 5-325 MG PO TABS: 1.0000 | ORAL_TABLET | Freq: Four times a day (QID) | ORAL | 0 refills | Status: DC | PRN

## 2021-07-28 MED ORDER — MORPHINE SULFATE (PF) 4 MG/ML IV SOLN
4.0000 mg | Freq: Once | INTRAVENOUS | Status: AC
  Administered 2021-07-28: 4 mg via INTRAVENOUS
  Filled 2021-07-28: qty 1

## 2021-07-28 MED ORDER — IPRATROPIUM-ALBUTEROL 0.5-2.5 (3) MG/3ML IN SOLN
3.0000 mL | Freq: Once | RESPIRATORY_TRACT | Status: AC
  Administered 2021-07-28: 3 mL via RESPIRATORY_TRACT
  Filled 2021-07-28: qty 3

## 2021-07-28 NOTE — ED Notes (Signed)
Signature pad unavailable for signature  

## 2021-07-28 NOTE — ED Notes (Signed)
Pt tolerated fluids po well

## 2021-07-28 NOTE — ED Triage Notes (Signed)
Pt c/o painful hernia

## 2021-07-28 NOTE — ED Provider Notes (Signed)
[facility] EMERGENCY DEPARTMENT Provider Note   CSN: [id number] Arrival date & time: 07/28/21  1415     History  Chief Complaint  Patient presents with   Abdominal Pain    Sean Thomas is a 61 y.o. male.  HPI   Pt is a 61 y/o male with a h/o COPD, inguinal hernia, DDD, dysphagia, HTN, HLD, anemia who presents to the ED today for eval of abd pain that started months ago but for the last few weeks the pain has been worse. States pain is located to the RLQ and to the right inguinal area radiating to the right testicle.  He states his hernia is reducible but worsens when he stands. He has associated swelling to his testicles when standing. Rates pain 10/10 and pain is constant.   Reports NV. Denies diarrhea, constipation, fever, urinary sxs, chest pain.  He is still passing flatus  Home Medications Prior to Admission medications   Medication Sig Start Date End Date Taking? Authorizing Provider  HYDROcodone-acetaminophen (NORCO/VICODIN) 5-325 MG tablet Take 1 tablet by mouth every 6 (six) hours as needed. 07/28/21  Yes Karren Newland S, PA-C  albuterol (PROAIR HFA) 108 (90 Base) MCG/ACT inhaler Inhale 2 puffs into the lungs every 4 (four) hours as needed for wheezing or shortness of breath. 11/01/20   Kinnie Feil, PA-C  albuterol (PROVENTIL) (2.5 MG/3ML) 0.083% nebulizer solution Take 3 mLs (2.5 mg total) by nebulization every 6 (six) hours as needed for wheezing or shortness of breath. 10/04/16   Nat Christen, MD  alfuzosin (UROXATRAL) 10 MG 24 hr tablet Take 1 tablet (10 mg total) by mouth at bedtime. 04/06/21   McKenzie, Candee Furbish, MD  diazepam (VALIUM) 5 MG tablet Take 5 mg by mouth daily as needed. 03/14/21   [provider]  FLUoxetine (PROZAC) 10 MG capsule Take 10 mg by mouth daily. 03/14/21   [provider]  Fluticasone-Umeclidin-Vilant (TRELEGY ELLIPTA) 100-62.5-25 MCG/INH AEPB Inhale 1 puff into the lungs daily. 11/01/20   Kinnie Feil, PA-C   gabapentin (NEURONTIN) 300 MG capsule Take 300 mg by mouth 3 (three) times daily. 01/15/20   [provider]  gabapentin (NEURONTIN) 600 MG tablet Take 600 mg by mouth 3 (three) times daily. 03/14/21   [provider]  meloxicam (MOBIC) 15 MG tablet Take 15 mg by mouth daily. 03/22/21   [provider]  omeprazole (PRILOSEC) 20 MG capsule Take 20 mg by mouth daily. 01/15/20   [provider]  Oxycodone HCl 10 MG TABS Take 10 mg by mouth every 6 (six) hours.     [provider]  tamsulosin (FLOMAX) 0.4 MG CAPS capsule Take 0.4 mg by mouth daily. 12/16/19   [provider]  traZODone (DESYREL) 50 MG tablet Take 150 mg by mouth at bedtime.    [provider]      Allergies    Advil [ibuprofen] and Aleve [naproxen sodium]    Review of Systems   Review of Systems  Constitutional:  Negative for chills and fever.  HENT:  Negative for ear pain and sore throat.   Eyes:  Negative for pain and visual disturbance.  Respiratory:  Negative for cough and shortness of breath.   Cardiovascular:  Negative for chest pain.  Gastrointestinal:  Positive for abdominal pain, nausea and vomiting. Negative for constipation and diarrhea.  Genitourinary:  Positive for testicular pain. Negative for dysuria and hematuria.  Musculoskeletal:  Negative for back pain.  Skin:  Negative for color  change and rash.  Neurological:  Negative for seizures and syncope.  All other systems reviewed and are negative.  Physical Exam Updated Vital Signs BP (!) 147/97    Pulse 63    Temp 97.8 F (36.6 C) (Oral)    Resp 20    Ht 6\' 2"  (1.88 m)    Wt 78.9 kg    SpO2 100%    BMI 22.34 kg/m  Physical Exam Vitals and nursing note reviewed.  Constitutional:      General: He is not in acute distress.    Appearance: He is well-developed.  HENT:     Head: Normocephalic and atraumatic.  Eyes:     Conjunctiva/sclera: Conjunctivae normal.  Cardiovascular:     Rate and Rhythm:  Normal rate and regular rhythm.     Heart sounds: No murmur heard. Pulmonary:     Effort: Pulmonary effort is normal. No respiratory distress.     Breath sounds: Normal breath sounds.  Abdominal:     Palpations: Abdomen is soft.     Tenderness: There is abdominal tenderness in the right lower quadrant. There is no guarding or rebound.  Genitourinary:    Comments: Chaperone present. Right inguinal hernia that appears to be reducible. There is some TTP over the area. No erythema or induration noted Musculoskeletal:        General: No swelling.     Cervical back: Neck supple.  Skin:    General: Skin is warm and dry.     Capillary Refill: Capillary refill takes less than 2 seconds.  Neurological:     Mental Status: He is alert.  Psychiatric:        Mood and Affect: Mood normal.    ED Results / Procedures / Treatments   Labs (all labs ordered are listed, but only abnormal results are displayed) Labs Reviewed  CBC WITH DIFFERENTIAL/PLATELET - Abnormal; Notable for the following components:      Result Value   WBC 11.4 (*)    Lymphs Abs 4.8 (*)    All other components within normal limits  COMPREHENSIVE METABOLIC PANEL - Abnormal; Notable for the following components:   Sodium 134 (*)    Glucose, Bld 122 (*)    Calcium 8.8 (*)    AST 14 (*)    Total Bilirubin 0.1 (*)    All other components within normal limits  LIPASE, BLOOD - Abnormal; Notable for the following components:   Lipase 53 (*)    All other components within normal limits  URINALYSIS, ROUTINE W REFLEX MICROSCOPIC    EKG None  Radiology CT ABDOMEN PELVIS W CONTRAST  Result Date: 07/28/2021 CLINICAL DATA:  Low pelvic pain times 3-4 weeks CC EXAM: CT ABDOMEN AND PELVIS WITH CONTRAST TECHNIQUE: Multidetector CT imaging of the abdomen and pelvis was performed using the standard protocol following bolus administration of intravenous contrast. CONTRAST:  150mL OMNIPAQUE IOHEXOL 300 MG/ML  SOLN COMPARISON:  MRI lumbar  spine August 29, 2017 FINDINGS: Lower chest: Nonunion of remote fracture deformities involving at least the left posteromedial 8th-10th ribs. Hypoventilatory change in the lung bases. Moderate hiatal hernia. Hepatobiliary: Hypodense 14 mm cyst in the right lobe of the liver. Additional bilobar hypodense tiny hepatic lesions are technically too small to accurately characterize but also statistically likely reflect a benign etiology such as cysts or hemangiomas. Gallbladder is unremarkable. No biliary ductal dilation. Pancreas: Pancreatic ductal dilation or evidence of acute inflammation. Spleen: Normal in size without focal abnormality. Adrenals/Urinary Tract: Bilateral adrenal glands  are unremarkable. No hydronephrosis. Bilateral kidneys demonstrate symmetric enhancement and excretion of contrast material no suspicious renal lesion. Mild symmetric wall thickening of an incompletely distended urinary bladder, with a posterior bladder wall diverticulum. Stomach/Bowel: No enteric contrast was administered. Moderate hiatal hernia. Distal stomach is unremarkable. No pathologic dilation of large or small bowel. The appendix and terminal ileum appear normal. Sigmoid colonic diverticulosis without findings of acute diverticulitis. Vascular/Lymphatic: Aortic and branch vessel atherosclerosis without abdominal aortic aneurysm. Circumaortic left renal vein. No pathologically enlarged abdominal or pelvic lymph nodes. Reproductive: Prostate is unremarkable. Partially visualized small bilateral hydroceles. Other: Lower ventral abdominal wall hernia repair tacks. Trace pelvic free fluid and right lower quadrant mesenteric edema. Small fat and fluid containing right inguinal hernia. Musculoskeletal: Multilevel degenerative changes spine. No acute osseous abnormality. IMPRESSION: 1. Mild symmetric wall thickening of an incompletely distended urinary bladder. Findings may represent cystitis. Correlation with urinalysis is  recommended. 2. Small fat and fluid containing right inguinal hernia. 3. Small bilateral hydroceles. 4. Moderate hiatal hernia. 5. Sigmoid colonic diverticulosis without findings of acute diverticulitis. 6. 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 7. Aortic Atherosclerosis (ICD10-I70.0). Electronically Signed   By: Dahlia Bailiff M.D.   On: 07/28/2021 17:47    Procedures Procedures     Medications Ordered in ED Medications  morphine 4 MG/ML injection 4 mg (4 mg Intravenous Given 07/28/21 1540)  sodium chloride 0.9 % bolus 1,000 mL (1,000 mLs Intravenous Bolus 07/28/21 1539)  ipratropium-albuterol (DUONEB) 0.5-2.5 (3) MG/3ML nebulizer solution 3 mL (3 mLs Nebulization Given 07/28/21 1614)  iohexol (OMNIPAQUE) 300 MG/ML solution 100 mL (100 mLs Intravenous Contrast Given 07/28/21 1713)  oxyCODONE-acetaminophen (PERCOCET/ROXICET) 5-325 MG per tablet 1 tablet (1 tablet Oral Given 07/28/21 1809)    ED Course/ Medical Decision Making/ A&P                           Medical Decision Making  61 y/o M presents for eval of abd pain and pain at his inguinal hernia site into the testicles.   Reviewed/interpreted labs CBC with mild leukocytosis, no anemia CMP is grossly reassuring, he has mild hyponatremia, slightly low calcium which seems chronic, otherwise unremarkable Lipase slightly elevated, doubt this is of clinical significance UA neg for UTI  Reviewed/interpreted imaging CT abd/pelvis - 1. Mild symmetric wall thickening of an incompletely distended urinary bladder. Findings may represent cystitis. Correlation with urinalysis is recommended. 2. Small fat and fluid containing right inguinal hernia. 3. Small bilateral hydroceles. 4. Moderate hiatal hernia. 5. Sigmoid colonic diverticulosis without findings of acute diverticulitis. 6. 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 7. Aortic Atherosclerosis  - UA  neg  - no complicating features of hernia  Pt with improvement of pain in the ED. He does not have any complicating features from his hernia.  He does appear to have some bilateral hydroceles which may be contributing to his pain.  On repeat exam his abdomen is soft and nontender.  Not have any evidence of any other emergent process in the abdomen at this time.  His laboratory work is reassuring, his urine is negative.  I do not feel that he requires admission or any further work-up at this point.  He does state that he has follow-up with general surgery on 1/24.  He previously was scheduled for hernia repair however he states he was arrested prior to this so he was unable to present  for surgery.  He is encouraged to follow-up as scheduled.  He would be given a course of pain medications to help control his symptoms until he can follow-up.  He is advised on specific return precautions for any new or worsening symptoms.  He voiced understanding of this plan and reasons to return.  All questions answered.  Patient stable for discharge.  Final Clinical Impression(s) / ED Diagnoses Final diagnoses:  Abdominal pain, unspecified abdominal location    Rx / DC Orders ED Discharge Orders          Ordered    HYDROcodone-acetaminophen (NORCO/VICODIN) 5-325 MG tablet  Every 6 hours PRN        07/28/21 1829              Rodney Booze, PA-C 07/28/21 1854    Isla Pence, MD 07/30/21 [phone]

## 2021-07-28 NOTE — Discharge Instructions (Addendum)
Prescription given for Norco. Take medication as directed and do not operate machinery, drive a car, or work while taking this medication as it can make you drowsy.   Take Miralax with this medication so you do not become constipated  Please follow up with the general surgery within the next 3-5 days.    Please return to the ER sooner if you have any new or worsening symptoms, or if you have any of the following symptoms:  Abdominal pain that does not go away.  You have a fever.  You keep throwing up (vomiting).  The pain is felt only in portions of the abdomen. Pain in the right side could possibly be appendicitis. In an adult, pain in the left lower portion of the abdomen could be colitis or diverticulitis.  You pass bloody or black tarry stools.  There is bright red blood in the stool.  The constipation stays for more than 4 days.  There is belly (abdominal) or rectal pain.  You do not seem to be getting better.  You have any questions or concerns.

## 2021-08-09 ENCOUNTER — Ambulatory Visit (INDEPENDENT_AMBULATORY_CARE_PROVIDER_SITE_OTHER): Payer: Medicaid Other | Admitting: General Surgery

## 2021-08-09 ENCOUNTER — Other Ambulatory Visit: Payer: Self-pay

## 2021-08-09 ENCOUNTER — Encounter: Payer: Self-pay | Admitting: General Surgery

## 2021-08-09 VITALS — BP 104/67 | HR 97 | Temp 98.0°F | Resp 18 | Ht 74.0 in | Wt 178.0 lb

## 2021-08-09 DIAGNOSIS — K4090 Unilateral inguinal hernia, without obstruction or gangrene, not specified as recurrent: Secondary | ICD-10-CM | POA: Diagnosis not present

## 2021-08-09 MED ORDER — OXYCODONE-ACETAMINOPHEN 10-325 MG PO TABS: 1.0000 | ORAL_TABLET | Freq: Four times a day (QID) | ORAL | 0 refills | Status: DC | PRN

## 2021-08-10 NOTE — H&P (Signed)
Sean Thomas; [id number]; [birth year]   HPI Patient is a 61 year old white male who was referred to my care by Pablo Lawrence for evaluation treatment of a right inguinal hernia.  Patient states he noticed the hernia approximately 4 month ago.  It is made worse with straining.  He does have pain at the hernia site as well as down to the right testicle.  It does reduce when lying down.  He is on chronic pain medication for his multiple back issues.  Patient has also tested positive for cocaine in the past, though he states he currently does not use cocaine.  He originally was supposed to have his hernia repaired in 2022, but had to be delayed due to outside social issues. Past Medical History:  Diagnosis Date   Cervical pain 11/21/2012   COPD (chronic obstructive pulmonary disease) (HCC)    DDD (degenerative disc disease)    Esophageal dysphagia    Essential hypertension    Fracture of multiple ribs 11/01/2012   Fracture of occipital condyle ([place]) 11/21/2012   Headache    Hyperlipidemia    Inguinal hernia 12/19/2013   Microcytic anemia     Past Surgical History:  Procedure Laterality Date   BACK SURGERY     COLONOSCOPY WITH PROPOFOL N/A 06/15/2016   RMR: inadequate bowel prep all mucosal surfaces not well seen. 5 mm tubular adenoma removed from the hepatic flexure. 3 mm polyp from the rectum was hyperplastic. He had scattered diverticula   ESOPHAGEAL BRUSHING  12/29/2019   Procedure: ESOPHAGEAL BRUSHING;  Surgeon: Daneil Dolin, MD;  Location: [facility];  Service: Endoscopy;;   ESOPHAGOGASTRODUODENOSCOPY (EGD) WITH PROPOFOL N/A 06/15/2016   Dr. Gala Romney: Large hiatal hernia, reflux esophagitis, Schatzki ring with focal area of ulceration, status post dilation with 58F Maloney dilator with moderate improvement in luminal narrowing.   ESOPHAGOGASTRODUODENOSCOPY (EGD) WITH PROPOFOL N/A 12/29/2019   Procedure: ESOPHAGOGASTRODUODENOSCOPY (EGD) WITH PROPOFOL;  Surgeon: Daneil Dolin, MD;   Location: [facility];  Service: Endoscopy;  Laterality: N/A;  9:30am, PAT telling pt to arrive at [place] Left 07/24/2014   Procedure: LAPAROSCOPIC LEFT INGUINAL HERNIA REPAIR ;  Surgeon: Ralene Ok, MD;  Location: [place];  Service: General;  Laterality: Left;   INSERTION OF MESH Left 07/24/2014   Procedure: INSERTION OF MESH;  Surgeon: Ralene Ok, MD;  Location: [place];  Service: General;  Laterality: Left;   [place] N/A 06/15/2016   Procedure: Venia Minks DILATION;  Surgeon: Daneil Dolin, MD;  Location: [facility];  Service: Endoscopy;  Laterality: N/A;   MALONEY DILATION N/A 12/29/2019   Procedure: Venia Minks DILATION;  Surgeon: Daneil Dolin, MD;  [facility]: [facility];  Service: Endoscopy;  Laterality: N/A;   POLYPECTOMY  06/15/2016   Procedure: POLYPECTOMY;  Surgeon: Daneil Dolin, MD;  Location: [facility];  Service: Endoscopy;;  colon   right lung surgery Right     Family History  Problem Relation Age of Onset   Diabetes Mother    Alzheimer's disease Mother    Diabetes Sister    Colon cancer Neg Hx    Liver disease Neg Hx     Current Outpatient Medications on File Prior to Visit  Medication Sig Dispense Refill   albuterol (PROAIR HFA) 108 (90 Base) MCG/ACT inhaler Inhale 2 puffs into the lungs every 4 (four) hours as needed for wheezing or shortness of breath. 18 g 0  albuterol (PROVENTIL) (2.5 MG/3ML) 0.083% nebulizer solution Take 3 mLs (2.5 mg total) by nebulization every 6 (six) hours as needed for wheezing or shortness of breath. 75 mL 3   diazepam (VALIUM) 5 MG tablet Take 5 mg by mouth daily as needed.     Fluticasone-Umeclidin-Vilant (TRELEGY ELLIPTA) 100-62.5-25 MCG/INH AEPB Inhale 1 puff into the lungs daily. 28 each 0   gabapentin (NEURONTIN) 300 MG capsule Take 300 mg by mouth 3 (three) times daily.     omeprazole (PRILOSEC) 20 MG capsule Take 20 mg by mouth daily.     Oxycodone HCl 10 MG TABS Take 10 mg  by mouth every 6 (six) hours.      tamsulosin (FLOMAX) 0.4 MG CAPS capsule Take 0.4 mg by mouth daily. (Patient not taking: Reported on 04/05/2021)     traZODone (DESYREL) 50 MG tablet Take 150 mg by mouth at bedtime. (Patient not taking: Reported on 04/05/2021)     No current facility-administered medications on file prior to visit.    Allergies  Allergen Reactions   Advil [Ibuprofen] Diarrhea   Aleve [Naproxen Sodium] Nausea And Vomiting    diarrhea    Social History   Substance and Sexual Activity  Alcohol Use Yes   Alcohol/week: 9.0 standard drinks   Types: 9 Cans of beer per week   Comment: occ beer    Social History   Tobacco Use  Smoking Status Every Day   Packs/day: 0.50   Years: 45.00   Pack years: 22.50   Types: Cigarettes  Smokeless Tobacco Never    Review of Systems  Constitutional: Negative.   HENT: Negative.    Respiratory:  Positive for cough and shortness of breath.   Cardiovascular: Negative.   Gastrointestinal:  Positive for abdominal pain.  Genitourinary: Negative.   Musculoskeletal:  Positive for back pain, joint pain and neck pain.  Skin: Negative.   Neurological: Negative.   Endo/Heme/Allergies: Negative.   Psychiatric/Behavioral:  Positive for substance abuse. The patient is nervous/anxious.    Objective   Vitals:   04/05/21 1437  BP: 138/84  Pulse: 80  Resp: 18  Temp: 99.5 F (37.5 C)  SpO2: 95%    Physical Exam Vitals reviewed.  Constitutional:      Appearance: Normal appearance. He is normal weight. He is not ill-appearing.  HENT:     Head: Normocephalic and atraumatic.  Cardiovascular:     Rate and Rhythm: Normal rate and regular rhythm.     Heart sounds: Normal heart sounds. No murmur heard.   No gallop.  Pulmonary:     Effort: Pulmonary effort is normal. No respiratory distress.     Breath sounds: Normal breath sounds. No stridor. No wheezing, rhonchi or rales.  Abdominal:     General: Abdomen is flat. Bowel sounds  are normal. There is no distension.     Palpations: Abdomen is soft. There is no mass.     Tenderness: There is no abdominal tenderness. There is no guarding or rebound.     Hernia: A hernia is present.     Comments: Right inguinal hernia which is reducible.  Skin:    General: Skin is warm and dry.  Neurological:     Mental Status: He is alert and oriented to person, place, and time.   Primary care notes reviewed Assessment  Right inguinal hernia Chronic pain management History of cocaine positivity Plan  Patient is scheduled for a right inguinal herniorrhaphy with mesh on 08/17/2020.  The risks and benefits  of the procedure including bleeding, infection, mesh use, and the possibility of recurrence of the hernia were fully explained to the patient, who gave informed consent.  I told him that he would need to be tested for cocaine prior to the surgery.  He states he has not been doing cocaine.

## 2021-08-10 NOTE — Progress Notes (Signed)
Subjective:     Sean Thomas  Patient presents back to reschedule his surgery.  He is still having right groin pain from his right inguinal hernia.  He was seen in the emergency room recently. Objective:    BP 104/67    Pulse 97    Temp 98 F (36.7 C) (Other (Comment))    Resp 18    Ht 6\' 2"  (1.88 m)    Wt 178 lb (80.7 kg)    SpO2 91%    BMI 22.85 kg/m   General:  alert, cooperative, and no distress  Head is normocephalic, atraumatic Lungs clear to auscultation with equal breath sounds bilaterally Heart examination reveals regular rate and rhythm without S3, S4, murmurs Abdomen is soft with a reducible right inguinal hernia     Assessment:    Right inguinal hernia    Plan:   Patient is scheduled for a right inguinal herniorrhaphy with mesh on 08/17/2021.  The risks and benefits of the procedure including bleeding, infection, mesh use, and the possibility of recurrence of the hernia were fully explained to the patient, who gave informed consent.

## 2021-08-10 NOTE — Patient Instructions (Signed)
Sean Thomas  08/10/2021     @PREFPERIOPPHARMACY @   Your procedure is scheduled on 08/17/2021.  Report to [name] at 10:50 A.M.  Call this number if you have problems the morning of surgery:  [phone]   Remember:  Do not eat or drink after midnight.                         Take these medicines the morning of surgery with A SIP OF WATER Prozac, omeprazole, and flomax    Do not wear jewelry, make-up or nail polish.  Do not wear lotions, powders, or perfumes, or deodorant.  Do not shave 48 hours prior to surgery.  Men may shave face and neck.  Do not bring valuables to the hospital.  [facility] is not responsible for any belongings or valuables.  Contacts, dentures or bridgework may not be worn into surgery.  Leave your suitcase in the car.  After surgery it may be brought to your room.  For patients admitted to the hospital, discharge time will be determined by your treatment team.  Patients discharged the day of surgery will not be allowed to drive home.  Open Hernia Repair, Adult, Care After The following information offers guidance on how to care for yourself after your procedure. Your health care provider may also give you more specific instructions. If you have problems or questions, contact your health care provider. What can I expect after the procedure? After the procedure, it is common to have: Mild discomfort. Slight bruising. Minor swelling. Pain in the abdomen. A small amount of blood from the incision. Follow these instructions at home: Medicines Take over-the-counter and prescription medicines only as told by your health care provider. Ask your health care provider if the medicine prescribed to you: Requires you to avoid driving or using machinery. Can cause constipation. You may need to take these actions to prevent or treat constipation: Drink enough fluid to keep your urine pale yellow. Take over-the-counter or prescription medicines. Eat foods  that are high in fiber, such as beans, whole grains, and fresh fruits and vegetables. Limit foods that are high in fat and processed sugars, such as fried or sweet foods. If you were prescribed an antibiotic medicine, take it as told by your health care provider. Do not stop using the antibiotic even if you start to feel better. Incision care  Follow instructions from your health care provider about how to take care of your incision. Make sure you: Wash your hands with soap and water for at least 20 seconds before and after you change your bandage (dressing). If soap and water are not available, use hand sanitizer. Change your dressing as told by your health care provider. Leave stitches (sutures), skin glue, or adhesive strips in place. These skin closures may need to stay in place for 2 weeks or longer. If adhesive strip edges start to loosen and curl up, you may trim the loose edges. Do not remove adhesive strips completely unless your health care provider tells you to do that. Check your incision area every day for signs of infection. Check for: More redness, swelling, or pain. More fluid or blood. Warmth. Pus or a bad smell. Wear loose, soft clothing while your incision heals. Activity  Rest as told by your health care provider. Do not lift anything that is heavier than 10 lb (4.5 kg), or the limit that you are told, until your health care provider says  that it is safe. Do not play contact sports until your health care provider says that this is safe. If you were given a sedative during the procedure, it can affect you for several hours. Do not drive or operate machinery until your health care provider says that it is safe. Return to your normal activities as told by your health care provider. Ask your health care provider what activities are safe for you. General instructions Do not take baths, swim, or use a hot tub until your health care provider approves. Ask your health care provider  if you may take showers. You may only be allowed to take sponge baths. Hold a pillow over your abdomen when you cough or sneeze. This helps with pain. Do not use any products that contain nicotine or tobacco. These products include cigarettes, chewing tobacco, and vaping devices, such as e-cigarettes. If you need help quitting, ask your health care provider. Keep all follow-up visits. This is important. Contact a health care provider if: You have any of these signs of infection: More redness, swelling, or pain around your incision. More fluid or blood coming from your incision. Warmth coming from your incision. Pus or a bad smell coming from your incision. A fever or chills. You have blood in your stool (feces). You have not had a bowel movement in 2-3 days. Your pain is not controlled with medicine. Get help right away if: You have chest pain or shortness of breath. You feel faint or light-headed. You have severe pain. You vomit and your pain is worse. You have pain, swelling, or redness in a leg. These symptoms may represent a serious problem that is an emergency. Do not wait to see if the symptoms will go away. Get medical help right away. Call your local emergency services (911 in the [place] Do not drive yourself to the hospital. Summary After an open hernia repair, it is common to have mild discomfort, slight bruising, and minor swelling. Follow instructions from your health care provider about how to take care of your incision. Check every day for signs of infection. Do not lift heavy objects or play contact sports until your health care provider says it is safe. Return to your normal activities as told by your health care provider. Ask your health care provider what activities are safe for you. This information is not intended to replace advice given to you by your health care provider. Make sure you discuss any questions you have with your health care provider. Document Revised:  02/16/2020 Document Reviewed: 02/16/2020 Elsevier Patient Education  2022 [place] Anesthesia, Adult, Care After This sheet gives you information about how to care for yourself after your procedure. Your health care provider may also give you more specific instructions. If you have problems or questions, contact your health care provider. What can I expect after the procedure? After the procedure, the following side effects are common: Pain or discomfort at the IV site. Nausea. Vomiting. Sore throat. Trouble concentrating. Feeling cold or chills. Feeling weak or tired. Sleepiness and fatigue. Soreness and body aches. These side effects can affect parts of the body that were not involved in surgery. Follow these instructions at home: For the time period you were told by your health care provider:  Rest. Do not participate in activities where you could fall or become injured. Do not drive or use machinery. Do not drink alcohol. Do not take sleeping pills or medicines that cause drowsiness. Do not make important decisions  or sign legal documents. Do not take care of children on your own. Eating and drinking Follow any instructions from your health care provider about eating or drinking restrictions. When you feel hungry, start by eating small amounts of foods that are soft and easy to digest (bland), such as toast. Gradually return to your regular diet. Drink enough fluid to keep your urine pale yellow. If you vomit, rehydrate by drinking water, juice, or clear broth. General instructions If you have sleep apnea, surgery and certain medicines can increase your risk for breathing problems. Follow instructions from your health care provider about wearing your sleep device: Anytime you are sleeping, including during daytime naps. While taking prescription pain medicines, sleeping medicines, or medicines that make you drowsy. Have a responsible adult stay with you for the  time you are told. It is important to have someone help care for you until you are awake and alert. Return to your normal activities as told by your health care provider. Ask your health care provider what activities are safe for you. Take over-the-counter and prescription medicines only as told by your health care provider. If you smoke, do not smoke without supervision. Keep all follow-up visits as told by your health care provider. This is important. Contact a health care provider if: You have nausea or vomiting that does not get better with medicine. You cannot eat or drink without vomiting. You have pain that does not get better with medicine. You are unable to pass urine. You develop a skin rash. You have a fever. You have redness around your IV site that gets worse. Get help right away if: You have difficulty breathing. You have chest pain. You have blood in your urine or stool, or you vomit blood. Summary After the procedure, it is common to have a sore throat or nausea. It is also common to feel tired. Have a responsible adult stay with you for the time you are told. It is important to have someone help care for you until you are awake and alert. When you feel hungry, start by eating small amounts of foods that are soft and easy to digest (bland), such as toast. Gradually return to your regular diet. Drink enough fluid to keep your urine pale yellow. Return to your normal activities as told by your health care provider. Ask your health care provider what activities are safe for you. This information is not intended to replace advice given to you by your health care provider. Make sure you discuss any questions you have with your health care provider. Document Revised: 03/18/2020 Document Reviewed: 10/16/2019 Elsevier Patient Education  2022 [employer].

## 2021-08-15 ENCOUNTER — Other Ambulatory Visit: Payer: Self-pay

## 2021-08-15 ENCOUNTER — Encounter (HOSPITAL_COMMUNITY)
Admission: RE | Admit: 2021-08-15 | Discharge: 2021-08-15 | Disposition: A | Discharge status: Home / Self Care | Payer: Medicaid Other | Source: Ambulatory Visit | Attending: General Surgery | Admitting: General Surgery

## 2021-08-15 ENCOUNTER — Encounter (HOSPITAL_COMMUNITY): Payer: Self-pay

## 2021-08-15 VITALS — BP 124/88 | HR 84 | Temp 98.0°F | Resp 18 | Ht 74.0 in | Wt 179.0 lb

## 2021-08-15 DIAGNOSIS — Z01818 Encounter for other preprocedural examination: Secondary | ICD-10-CM

## 2021-08-15 DIAGNOSIS — Z01812 Encounter for preprocedural laboratory examination: Secondary | ICD-10-CM | POA: Insufficient documentation

## 2021-08-15 LAB — RAPID URINE DRUG SCREEN, HOSP PERFORMED
Amphetamines: NOT DETECTED
Barbiturates: NOT DETECTED
Benzodiazepines: POSITIVE — AB
Cocaine: NOT DETECTED
Opiates: NOT DETECTED
Tetrahydrocannabinol: NOT DETECTED

## 2021-08-17 ENCOUNTER — Encounter (HOSPITAL_COMMUNITY): Payer: Self-pay | Admitting: General Surgery

## 2021-08-17 ENCOUNTER — Ambulatory Visit (HOSPITAL_COMMUNITY)
Admission: RE | Admit: 2021-08-17 | Discharge: 2021-08-17 | Disposition: A | Discharge status: Home / Self Care | Payer: Medicaid Other | Attending: General Surgery | Admitting: General Surgery

## 2021-08-17 ENCOUNTER — Ambulatory Visit (HOSPITAL_COMMUNITY): Payer: Medicaid Other | Admitting: Anesthesiology

## 2021-08-17 ENCOUNTER — Encounter (HOSPITAL_COMMUNITY)
Admission: RE | Disposition: A | Discharge status: Home / Self Care | Payer: Self-pay | Source: Home / Self Care | Attending: General Surgery

## 2021-08-17 ENCOUNTER — Other Ambulatory Visit: Payer: Self-pay

## 2021-08-17 DIAGNOSIS — K4090 Unilateral inguinal hernia, without obstruction or gangrene, not specified as recurrent: Secondary | ICD-10-CM | POA: Diagnosis present

## 2021-08-17 DIAGNOSIS — I10 Essential (primary) hypertension: Secondary | ICD-10-CM | POA: Insufficient documentation

## 2021-08-17 DIAGNOSIS — J449 Chronic obstructive pulmonary disease, unspecified: Secondary | ICD-10-CM | POA: Diagnosis not present

## 2021-08-17 DIAGNOSIS — K219 Gastro-esophageal reflux disease without esophagitis: Secondary | ICD-10-CM | POA: Insufficient documentation

## 2021-08-17 DIAGNOSIS — R0600 Dyspnea, unspecified: Secondary | ICD-10-CM | POA: Diagnosis not present

## 2021-08-17 DIAGNOSIS — F17210 Nicotine dependence, cigarettes, uncomplicated: Secondary | ICD-10-CM | POA: Diagnosis not present

## 2021-08-17 HISTORY — PX: INGUINAL HERNIA REPAIR: SHX194

## 2021-08-17 SURGERY — REPAIR, HERNIA, INGUINAL, ADULT
Anesthesia: General | Site: Abdomen | Laterality: Right

## 2021-08-17 MED ORDER — DEXAMETHASONE SODIUM PHOSPHATE 10 MG/ML IJ SOLN
INTRAMUSCULAR | Status: DC | PRN
  Administered 2021-08-17: 5 mg via INTRAVENOUS

## 2021-08-17 MED ORDER — SUGAMMADEX SODIUM 500 MG/5ML IV SOLN
INTRAVENOUS | Status: DC | PRN
  Administered 2021-08-17: 350 mg via INTRAVENOUS

## 2021-08-17 MED ORDER — ORAL CARE MOUTH RINSE: 15.0000 mL | Freq: Once | OROMUCOSAL | Status: AC

## 2021-08-17 MED ORDER — CHLORHEXIDINE GLUCONATE CLOTH 2 % EX PADS: 6.0000 | MEDICATED_PAD | Freq: Once | CUTANEOUS | Status: DC

## 2021-08-17 MED ORDER — HYDROMORPHONE HCL 1 MG/ML IJ SOLN
1.0000 mg | Freq: Once | INTRAMUSCULAR | Status: AC
  Administered 2021-08-17: 1 mg via INTRAVENOUS

## 2021-08-17 MED ORDER — BUPIVACAINE HCL (300 MG DOSE) 3 X 100 MG IL IMPL
DRUG_IMPLANT | Status: DC | PRN
  Administered 2021-08-17: 225 mg

## 2021-08-17 MED ORDER — ONDANSETRON HCL 4 MG/2ML IJ SOLN: 4.0000 mg | Freq: Once | INTRAMUSCULAR | Status: DC | PRN

## 2021-08-17 MED ORDER — PHENYLEPHRINE HCL (PRESSORS) 10 MG/ML IV SOLN
INTRAVENOUS | Status: DC | PRN
  Administered 2021-08-17 (×6): 80 ug via INTRAVENOUS

## 2021-08-17 MED ORDER — PROPOFOL 10 MG/ML IV BOLUS
INTRAVENOUS | Status: DC | PRN
  Administered 2021-08-17: 200 mg via INTRAVENOUS

## 2021-08-17 MED ORDER — BUPIVACAINE HCL (300 MG DOSE) 3 X 100 MG IL IMPL
DRUG_IMPLANT | Status: AC
  Filled 2021-08-17: qty 100

## 2021-08-17 MED ORDER — OXYCODONE HCL 5 MG PO TABS: 5.0000 mg | ORAL_TABLET | ORAL | 0 refills | Status: DC | PRN

## 2021-08-17 MED ORDER — LACTATED RINGERS IV SOLN: INTRAVENOUS | Status: DC

## 2021-08-17 MED ORDER — SUGAMMADEX SODIUM 500 MG/5ML IV SOLN
INTRAVENOUS | Status: AC
  Filled 2021-08-17: qty 5

## 2021-08-17 MED ORDER — HYDROMORPHONE HCL 1 MG/ML IJ SOLN: 0.2500 mg | INTRAMUSCULAR | Status: DC | PRN

## 2021-08-17 MED ORDER — CEFAZOLIN SODIUM-DEXTROSE 2-4 GM/100ML-% IV SOLN
INTRAVENOUS | Status: AC
  Filled 2021-08-17: qty 100

## 2021-08-17 MED ORDER — CHLORHEXIDINE GLUCONATE 0.12 % MT SOLN
OROMUCOSAL | Status: AC
  Filled 2021-08-17: qty 15

## 2021-08-17 MED ORDER — HYDROMORPHONE HCL 1 MG/ML IJ SOLN
INTRAMUSCULAR | Status: AC
  Filled 2021-08-17: qty 0.5

## 2021-08-17 MED ORDER — ONDANSETRON HCL 4 MG/2ML IJ SOLN
INTRAMUSCULAR | Status: DC | PRN
  Administered 2021-08-17: 4 mg via INTRAVENOUS

## 2021-08-17 MED ORDER — ROCURONIUM BROMIDE 10 MG/ML (PF) SYRINGE
PREFILLED_SYRINGE | INTRAVENOUS | Status: DC | PRN
Start: 2021-08-17 — End: 2021-08-17
  Administered 2021-08-17: 50 mg via INTRAVENOUS

## 2021-08-17 MED ORDER — ROCURONIUM BROMIDE 10 MG/ML (PF) SYRINGE
PREFILLED_SYRINGE | INTRAVENOUS | Status: AC
  Filled 2021-08-17: qty 10

## 2021-08-17 MED ORDER — FENTANYL CITRATE (PF) 250 MCG/5ML IJ SOLN
INTRAMUSCULAR | Status: AC
  Filled 2021-08-17: qty 5

## 2021-08-17 MED ORDER — MIDAZOLAM HCL 2 MG/2ML IJ SOLN
INTRAMUSCULAR | Status: AC
  Filled 2021-08-17: qty 2

## 2021-08-17 MED ORDER — SODIUM CHLORIDE 0.9 % IR SOLN
Status: DC | PRN
  Administered 2021-08-17: 1000 mL

## 2021-08-17 MED ORDER — PHENYLEPHRINE 40 MCG/ML (10ML) SYRINGE FOR IV PUSH (FOR BLOOD PRESSURE SUPPORT)
PREFILLED_SYRINGE | INTRAVENOUS | Status: AC
  Filled 2021-08-17: qty 20

## 2021-08-17 MED ORDER — MEPERIDINE HCL 50 MG/ML IJ SOLN: 6.2500 mg | INTRAMUSCULAR | Status: DC | PRN

## 2021-08-17 MED ORDER — LIDOCAINE HCL (PF) 2 % IJ SOLN
INTRAMUSCULAR | Status: AC
  Filled 2021-08-17: qty 15

## 2021-08-17 MED ORDER — DEXAMETHASONE SODIUM PHOSPHATE 10 MG/ML IJ SOLN
INTRAMUSCULAR | Status: AC
  Filled 2021-08-17: qty 1

## 2021-08-17 MED ORDER — CHLORHEXIDINE GLUCONATE 0.12 % MT SOLN
15.0000 mL | Freq: Once | OROMUCOSAL | Status: AC
  Administered 2021-08-17: 15 mL via OROMUCOSAL

## 2021-08-17 MED ORDER — HYDROMORPHONE HCL 1 MG/ML IJ SOLN
INTRAMUSCULAR | Status: AC
  Filled 2021-08-17: qty 1

## 2021-08-17 MED ORDER — LIDOCAINE HCL (CARDIAC) PF 100 MG/5ML IV SOSY
PREFILLED_SYRINGE | INTRAVENOUS | Status: DC | PRN
  Administered 2021-08-17: 60 mg via INTRATRACHEAL

## 2021-08-17 MED ORDER — MIDAZOLAM HCL 2 MG/2ML IJ SOLN
INTRAMUSCULAR | Status: DC | PRN
  Administered 2021-08-17: 2 mg via INTRAVENOUS

## 2021-08-17 MED ORDER — FENTANYL CITRATE (PF) 100 MCG/2ML IJ SOLN
INTRAMUSCULAR | Status: DC | PRN
  Administered 2021-08-17: 50 ug via INTRAVENOUS
  Administered 2021-08-17: 100 ug via INTRAVENOUS

## 2021-08-17 MED ORDER — CEFAZOLIN SODIUM-DEXTROSE 2-4 GM/100ML-% IV SOLN
2.0000 g | INTRAVENOUS | Status: AC
  Administered 2021-08-17: 2 g via INTRAVENOUS

## 2021-08-17 SURGICAL SUPPLY — 28 items
ADH SKN CLS APL DERMABOND .7 (GAUZE/BANDAGES/DRESSINGS) ×1
CLOTH BEACON ORANGE TIMEOUT ST (SAFETY) ×2 IMPLANT
COVER LIGHT HANDLE STERIS (MISCELLANEOUS) ×4 IMPLANT
DERMABOND ADVANCED (GAUZE/BANDAGES/DRESSINGS) ×1
DERMABOND ADVANCED .7 DNX12 (GAUZE/BANDAGES/DRESSINGS) ×1 IMPLANT
DRAIN PENROSE 0.5X18 (DRAIN) ×2 IMPLANT
ELECT REM PT RETURN 9FT ADLT (ELECTROSURGICAL) ×2
ELECTRODE REM PT RTRN 9FT ADLT (ELECTROSURGICAL) ×1 IMPLANT
GAUZE 4X4 16PLY ~~LOC~~+RFID DBL (SPONGE) ×2 IMPLANT
GAUZE SPONGE 4X4 12PLY STRL (GAUZE/BANDAGES/DRESSINGS) ×2 IMPLANT
GLOVE SURG POLYISO LF SZ7.5 (GLOVE) ×2 IMPLANT
GLOVE SURG UNDER POLY LF SZ7 (GLOVE) ×6 IMPLANT
GOWN STRL REUS W/TWL LRG LVL3 (GOWN DISPOSABLE) ×6 IMPLANT
INST SET MINOR GENERAL (KITS) ×2 IMPLANT
KIT TURNOVER KIT A (KITS) ×2 IMPLANT
MANIFOLD NEPTUNE II (INSTRUMENTS) ×2 IMPLANT
MESH MARLEX PLUG MEDIUM (Mesh General) ×1 IMPLANT
NS IRRIG 1000ML POUR BTL (IV SOLUTION) ×2 IMPLANT
PACK MINOR (CUSTOM PROCEDURE TRAY) ×2 IMPLANT
PAD ARMBOARD 7.5X6 YLW CONV (MISCELLANEOUS) ×2 IMPLANT
SET BASIN LINEN APH (SET/KITS/TRAYS/PACK) ×2 IMPLANT
SOL PREP PROV IODINE SCRUB 4OZ (MISCELLANEOUS) ×2 IMPLANT
SUT MNCRL AB 4-0 PS2 18 (SUTURE) ×2 IMPLANT
SUT NOVA NAB GS-22 2 2-0 T-19 (SUTURE) ×4 IMPLANT
SUT VIC AB 2-0 CT1 27 (SUTURE) ×2
SUT VIC AB 2-0 CT1 TAPERPNT 27 (SUTURE) ×1 IMPLANT
SUT VIC AB 3-0 SH 27 (SUTURE) ×2
SUT VIC AB 3-0 SH 27X BRD (SUTURE) ×1 IMPLANT

## 2021-08-17 NOTE — Interval H&P Note (Signed)
History and Physical Interval Note:  08/17/2021 11:50 AM  Sean Thomas  has presented today for surgery, with the diagnosis of R INGUINAL HERNIA.  The various methods of treatment have been discussed with the patient and family. After consideration of risks, benefits and other options for treatment, the patient has consented to  Procedure(s) with comments: Deputy (Right) - Pt notified to arrive at 8:00am per KF as a surgical intervention.  The patient's history has been reviewed, patient examined, no change in status, stable for surgery.  I have reviewed the patient's chart and labs.  Questions were answered to the patient's satisfaction.     Aviva Signs

## 2021-08-17 NOTE — Anesthesia Preprocedure Evaluation (Signed)
Anesthesia Evaluation  Patient identified by MRN, date of birth, ID band Patient awake    Reviewed: Allergy & Precautions, NPO status , Patient's Chart, lab work & pertinent test results  Airway Mallampati: II  TM Distance: >3 FB Neck ROM: Full    Dental  (+) Edentulous Upper, Edentulous Lower, Dental Advisory Given   Pulmonary COPD,  COPD inhaler, Current SmokerPatient did not abstain from smoking.,    breath sounds clear to auscultation + decreased breath sounds      Cardiovascular hypertension, Pt. on medications + DOE   Rhythm:Irregular Rate:Normal     Neuro/Psych  Headaches, negative psych ROS   GI/Hepatic Neg liver ROS, GERD  Medicated and Controlled,  Endo/Other  negative endocrine ROS  Renal/GU negative Renal ROS  negative genitourinary   Musculoskeletal negative musculoskeletal ROS (+) Arthritis , Osteoarthritis,    Abdominal   Peds negative pediatric ROS (+)  Hematology  (+) Blood dyscrasia, anemia ,   Anesthesia Other Findings Multiple rib fx Chronic back pain  Reproductive/Obstetrics negative OB ROS                            Anesthesia Physical Anesthesia Plan  ASA: 3  Anesthesia Plan: General   Post-op Pain Management: Dilaudid IV   Induction: Intravenous  PONV Risk Score and Plan: 3 and Ondansetron, Dexamethasone and Midazolam  Airway Management Planned: Oral ETT  Additional Equipment:   Intra-op Plan:   Post-operative Plan: Extubation in OR  Informed Consent: I have reviewed the patients History and Physical, chart, labs and discussed the procedure including the risks, benefits and alternatives for the proposed anesthesia with the patient or authorized representative who has indicated his/her understanding and acceptance.     Dental advisory given  Plan Discussed with: CRNA and Surgeon  Anesthesia Plan Comments:         Anesthesia Quick  Evaluation

## 2021-08-17 NOTE — Anesthesia Procedure Notes (Signed)
Procedure Name: Intubation Date/Time: 08/17/2021 12:51 PM Performed by: Karna Dupes, CRNA Pre-anesthesia Checklist: Patient identified, Emergency Drugs available, Suction available and Patient being monitored Patient Re-evaluated:Patient Re-evaluated prior to induction Oxygen Delivery Method: Circle system utilized Preoxygenation: Pre-oxygenation with 100% oxygen Induction Type: IV induction Ventilation: Mask ventilation without difficulty Laryngoscope Size: Mac and 4 Grade View: Grade I Tube type: Oral Tube size: 7.5 mm Number of attempts: 1 Airway Equipment and Method: Stylet Placement Confirmation: ETT inserted through vocal cords under direct vision, positive ETCO2 and breath sounds checked- equal and bilateral Secured at: 22 cm Tube secured with: Tape Dental Injury: Teeth and Oropharynx as per pre-operative assessment

## 2021-08-17 NOTE — Op Note (Signed)
Patient:  Sean Thomas  DOB:  [birth year]  MRN:  [id number]   Preop Diagnosis:  Right inguinal hernia  Postop Diagnosis:  Same  Procedure:  Right inguinal herniorrhaphy with mesh   Surgeon:  Aviva Signs, MD   Anes:  General  Indications: Patient is a 61 year old white male who presents with a symptomatic right inguinal hernia.  The risks and benefits of the procedure including bleeding, infection, mesh use, and the possibility of recurrence of the hernia were fully explained to the patient, who gave informed consent.  Procedure note: The patient was placed in supine position.  After general anesthesia was administered, the right groin region was prepped and draped using the usual sterile technique with Betadine.  Surgical site confirmation was performed.  Incision was made in the right groin region down to the external oblique aponeuroses.  The aponeurosis was incised to the external ring.  A Penrose drain was placed around the spermatic cord.  The vas deferens was noted within the spermatic cord.  An indirect hernia sac was found.  This was freed away from the spermatic cord up to the peritoneal reflection and inverted.  A medium size Bard PerFix plug was then placed.  An onlay patch was placed along the floor of the inguinal canal and secured superiorly to the conjoined tendon and inferiorly to the shelving edge of Poupart's ligament using 2-0 Novafil interrupted sutures.  Care was taken to avoid the ilioinguinal nerve.  Robynn Pane was placed over the mesh and in the subcutaneous tissue.  The external oblique aponeurosis was reapproximated using a 2-0 Vicryl running suture.  The subcutaneous layer was reapproximated using a 3-0 Vicryl interrupted suture.  The skin was closed using a 4-0 Monocryl subcuticular suture.  Dermabond was applied.  All tape and needle counts were correct at the end of the procedure.  The patient was awakened and transferred to PACU in stable  condition.  Complications: None  EBL: Minimal  Specimen: None

## 2021-08-17 NOTE — Discharge Instructions (Signed)
Leave orange bracelet on until Sunday, February 5.  Do not use any topical lidocaine, ben-gay, salon pas.   Do not have any dental work done with lidocaine

## 2021-08-17 NOTE — Transfer of Care (Signed)
Immediate Anesthesia Transfer of Care Note  Patient: Sean Thomas  Procedure(s) Performed: HERNIA REPAIR INGUINAL ADULT W/ MESH (Right: Abdomen)  Patient Location: PACU  Anesthesia Type:General  Level of Consciousness: drowsy  Airway & Oxygen Therapy: Patient Spontanous Breathing and Patient connected to nasal cannula oxygen  Post-op Assessment: Report given to RN and Post -op Vital signs reviewed and stable  Post vital signs: Reviewed and stable  Last Vitals:  Vitals Value Taken Time  BP 118/71   Temp    Pulse 67   Resp 19   SpO2 99%     Last Pain:  Vitals:   08/17/21 1030  PainSc: 10-Worst pain ever         Complications: No notable events documented.

## 2021-08-17 NOTE — Anesthesia Postprocedure Evaluation (Signed)
Anesthesia Post Note  Patient: Sean Thomas  Procedure(s) Performed: HERNIA REPAIR INGUINAL ADULT W/ MESH (Right: Abdomen)  Patient location during evaluation: PACU Anesthesia Type: General Level of consciousness: awake and alert and oriented Pain management: pain level controlled Vital Signs Assessment: post-procedure vital signs reviewed and stable Respiratory status: spontaneous breathing, nonlabored ventilation and respiratory function stable Cardiovascular status: blood pressure returned to baseline and stable Postop Assessment: no apparent nausea or vomiting Anesthetic complications: no   No notable events documented.   Last Vitals:  Vitals:   08/17/21 1429 08/17/21 1434  BP: 132/84   Pulse: 91   Resp: 16   Temp: 36.6 C   SpO2:  92%    Last Pain:  Vitals:   08/17/21 1429  TempSrc: Oral  PainSc: 4                  Belita Warsame C Aviannah Castoro

## 2021-08-18 ENCOUNTER — Encounter (HOSPITAL_COMMUNITY): Payer: Self-pay | Admitting: General Surgery

## 2021-08-18 ENCOUNTER — Telehealth: Payer: Self-pay | Admitting: *Deleted

## 2021-08-18 NOTE — Telephone Encounter (Signed)
Received call from patient.   Reports that he is unable to obtain prescription from Dr. Arnoldo Morale for Oxycodone 5mg  tabs. He is currently taking Oxycodone/ APAP 5/325mg  tabs from pain clinic. Of note, patient is also currently taking Meloxicam 15mg  tabs QD.   08/15/2021: Oxycodone/ APAP 5/325mg  tabs- QID PRN- #56 tabs   Patient states that though prescription from pain clinic is to be taken every 6 hours as needed, he is currently taking them every 4 hours for pain S/P hernia repair. Reports that he is going to run out of pain medication and needs something stronger.   Advised patient to only use medications as prescribed. Advised that he can hold Meloxicam and use IBU 600mg  every 6 hours in between Oxycodone/APAP. Patient states that he cannot tolerate IBU or Aleve.   Patient noted upset and states that he discussed pain contract with Dr. Arnoldo Morale and Dr. Arnoldo Morale should have prescribed patient something stronger. Patient then hung up on writer.   Dr. Arnoldo Morale to be made aware.

## 2021-08-26 ENCOUNTER — Encounter: Payer: Self-pay | Admitting: General Surgery

## 2021-08-26 ENCOUNTER — Ambulatory Visit (INDEPENDENT_AMBULATORY_CARE_PROVIDER_SITE_OTHER): Payer: Medicaid Other | Admitting: General Surgery

## 2021-08-26 DIAGNOSIS — Z09 Encounter for follow-up examination after completed treatment for conditions other than malignant neoplasm: Secondary | ICD-10-CM

## 2021-08-26 NOTE — Progress Notes (Signed)
Subjective:     Sean Thomas  Virtual telephone postop visit performed.  Is having decreased incisional pain.  Seeing his pain specialist today.  Is increasing activity as able. Objective:    There were no vitals taken for this visit.  General:  cooperative       Assessment:    Doing well postoperatively.    Plan:   Increase activity as able.  Follow up with me prn.

## 2021-11-01 ENCOUNTER — Encounter (HOSPITAL_COMMUNITY): Payer: Self-pay | Admitting: *Deleted

## 2021-11-01 ENCOUNTER — Emergency Department (HOSPITAL_COMMUNITY)
Admission: EM | Admit: 2021-11-01 | Discharge: 2021-11-01 | Disposition: A | Discharge status: Home / Self Care | Payer: Medicaid Other | Attending: Emergency Medicine | Admitting: Emergency Medicine

## 2021-11-01 ENCOUNTER — Other Ambulatory Visit: Payer: Self-pay

## 2021-11-01 DIAGNOSIS — W268XXA Contact with other sharp object(s), not elsewhere classified, initial encounter: Secondary | ICD-10-CM | POA: Diagnosis not present

## 2021-11-01 DIAGNOSIS — S61412A Laceration without foreign body of left hand, initial encounter: Secondary | ICD-10-CM | POA: Diagnosis not present

## 2021-11-01 DIAGNOSIS — S6992XA Unspecified injury of left wrist, hand and finger(s), initial encounter: Secondary | ICD-10-CM | POA: Diagnosis present

## 2021-11-01 NOTE — ED Triage Notes (Signed)
Pt cut top of left hand using a box cutter, minimal bleeding ?

## 2021-11-01 NOTE — ED Provider Notes (Signed)
?[facility] ?Provider Note ? ? ?CSN: [id number] ?Arrival date & time: 11/01/21  1443 ? ?  ? ?History ? ?Chief Complaint  ?Patient presents with  ? Laceration  ? ? ?Sean Thomas is a 61 y.o. male.  Patient presents to the hospital complaining of laceration to the dorsal aspect of the left hand.  Patient states he was using a box cutter when he slipped and cut the area near the MCP joint of the third finger.  Bleeding was minimal upon arrival.  Patient does not take blood thinners.  No relevant past medical history.  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 ?HPI ? ?  ? ?Home Medications ?Prior to Admission medications   ?Medication Sig Start Date End Date Taking? Authorizing Provider  ?albuterol (PROAIR HFA) 108 (90 Base) MCG/ACT inhaler Inhale 2 puffs into the lungs every 4 (four) hours as needed for wheezing or shortness of breath. 11/01/20   Kinnie Feil, PA-C  ?albuterol (PROVENTIL) (2.5 MG/3ML) 0.083% nebulizer solution Take 3 mLs (2.5 mg total) by nebulization every 6 (six) hours as needed for wheezing or shortness of breath. 10/04/16   Nat Christen, MD  ?alfuzosin (UROXATRAL) 10 MG 24 hr tablet Take 1 tablet (10 mg total) by mouth at bedtime. 04/06/21   McKenzie, Candee Furbish, MD  ?cholecalciferol (VITAMIN D3) 25 MCG (1000 UNIT) tablet Take 1,000 Units by mouth daily.    [provider]  ?diazepam (VALIUM) 5 MG tablet Take 5 mg by mouth at bedtime. 03/14/21   [provider]  ?ferrous sulfate 325 (65 FE) MG tablet Take 325 mg by mouth daily with breakfast.    [provider]  ?FLUoxetine (PROZAC) 10 MG capsule Take 10 mg by mouth daily. 03/14/21   [provider]  ?Fluticasone-Umeclidin-Vilant (TRELEGY ELLIPTA) 100-62.5-25 MCG/INH AEPB Inhale 1 puff into the lungs daily. 11/01/20   Kinnie Feil, PA-C  ?gabapentin (NEURONTIN) 600 MG tablet Take 600 mg by mouth 3 (three) times daily. 03/14/21   [provider]  ?omeprazole (PRILOSEC)  20 MG capsule Take 20 mg by mouth daily. 01/15/20   [provider]  ?oxyCODONE (ROXICODONE) 5 MG immediate release tablet Take 1 tablet (5 mg total) by mouth every 4 (four) hours as needed. 08/17/21 08/17/22  Aviva Signs, MD  ?tamsulosin (FLOMAX) 0.4 MG CAPS capsule Take 0.4 mg by mouth daily. 12/16/19   [provider]  ?traZODone (DESYREL) 50 MG tablet Take 50 mg by mouth at bedtime.    [provider]  ?   ? ?Allergies    ?Advil [ibuprofen] and Aleve [naproxen sodium]   ? ?Review of Systems   ?Review of Systems  ?Skin:  Positive for wound.  ? ?Physical Exam ?Updated Vital Signs ?BP (!) 139/93 (BP Location: Right Arm)   Pulse 89   Temp 98 ?F (36.7 ?C) (Oral)   Resp 18   SpO2 92%  ?Physical Exam ?Vitals and nursing note reviewed.  ?Constitutional:   ?   General: He is not in acute distress. ?   Appearance: He is well-developed.  ?HENT:  ?   Head: Normocephalic and atraumatic.  ?Eyes:  ?   Conjunctiva/sclera: Conjunctivae normal.  ?Cardiovascular:  ?   Rate and Rhythm: Normal rate.  ?Pulmonary:  ?   Effort: Pulmonary effort is normal. No respiratory distress.  ?Musculoskeletal:     ?   General: No swelling.  ?   Cervical back: Neck supple.  ?Skin: ?   General:  Skin is warm and dry.  ?   Capillary Refill: Capillary refill takes less than 2 seconds.  ? ?    ?Neurological:  ?   Mental Status: He is alert.  ?Psychiatric:     ?   Mood and Affect: Mood normal.  ? ? ?ED Results / Procedures / Treatments   ?Labs ?(all labs ordered are listed, but only abnormal results are displayed) ?Labs Reviewed - No data to display ? ?EKG ?None ? ?Radiology ?No results found. ? ?Procedures ?Marland Kitchen.Laceration Repair ? ?Date/Time: 11/01/2021 3:10 PM ?Performed by: Dorothyann Peng, PA-C ?Authorized by: Dorothyann Peng, PA-C  ? ?Consent:  ?  Consent obtained:  Verbal ?  Consent given by:  Patient ?  Risks discussed:  Infection, need for additional repair, pain, poor cosmetic result and poor wound healing ?   Alternatives discussed:  No treatment and delayed treatment ?Universal protocol:  ?  Procedure explained and questions answered to patient or proxy's satisfaction: yes   ?  Relevant documents present and verified: yes   ?  Required blood products, implants, devices, and special equipment available: yes   ?  Site/side marked: yes   ?  Immediately prior to procedure, a time out was called: yes   ?  Patient identity confirmed:  Verbally with patient ?Anesthesia:  ?  Anesthesia method:  None ?Laceration details:  ?  Location:  Hand ?  Hand location:  L hand, dorsum ?  Length (cm):  1 ?  Depth (mm):  4 ?Pre-procedure details:  ?  Preparation:  Patient was prepped and draped in usual sterile fashion ?Exploration:  ?  Hemostasis achieved with:  Direct pressure ?Treatment:  ?  Area cleansed with:  Povidone-iodine ?  Amount of cleaning:  Standard ?Skin repair:  ?  Repair method:  Tissue adhesive ?Approximation:  ?  Approximation:  Close ?Repair type:  ?  Repair type:  Simple ?Post-procedure details:  ?  Dressing:  Non-adherent dressing ?  Procedure completion:  Tolerated well, no immediate complications  ? ? ?Medications Ordered in ED ?Medications - No data to display ? ?ED Course/ Medical Decision Making/ A&P ?  ?                        ?Medical Decision Making ? ?The patient presents to the hospital complaining of a laceration to the left hand.  Mechanism of injury was a box cutter.  The patient has normal range of motion in his fingers, normal sensation, good cap refill.  I have no reason to suspect deep structure damage.  The wound was cleaned and repaired using Dermabond.  See procedure note above.  Incision care instructions provided to the patient along with what to watch for in case of developing infection. Discharge home ? ? ?Final Clinical Impression(s) / ED Diagnoses ?Final diagnoses:  ?Laceration of left hand without foreign body, initial encounter  ? ? ?Rx / DC Orders ?ED Discharge Orders   ? ? None  ? ?  ? ? ?   ?Dorothyann Peng, PA-C ?11/01/21 1550 ? ?  ?Milton Ferguson, MD ?11/02/21 1142 ? ?

## 2021-11-01 NOTE — Discharge Instructions (Signed)
You were seen today for a laceration of the left hand.  This was repaired with surgical glue.  Avoid getting the hand wet for the next 12 to 24 hours.  Afterwards you may shower and wash hands as usual.  Do not peel the surgical glue off.  Let it fall off on its own.  Watch for any spreading redness which could be a sign of infection. ?

## 2021-11-07 ENCOUNTER — Ambulatory Visit: Payer: Medicaid Other | Admitting: Urology

## 2022-01-16 NOTE — Progress Notes (Deleted)
GI Office Note    Referring Provider: No ref. provider found Primary Care Physician:  Pcp, No Primary GI: Dr. Gala Romney  Date:  01/16/2022  ID:  Sean Thomas, DOB 1961-03-17, MRN [id number]   Chief Complaint   No chief complaint on file.    History of Present Illness  Sean Thomas is a 61 y.o. male with a history of COPD, HTN, HLD, anemia, and dysphagia*** presenting today with complaint of dysphagia.   Last colonoscopy 06/15/16 -diverticulosis in the examined colon, single 5 mm polyp at hepatic flexure (tubular adenoma), single 3 mm polyp in the rectum (hyperplastic), unable to evaluate entire mucosal surface, inadequate prep.  Repeat in 5 years.  BPE 12/25/2019 showing premature spillover of contrast with laryngeal penetration and aspiration of contrast on the posterior wall of the trachea, weak spontaneous cough reflex, nonobstructive cervical esophageal web, stricture at the GE junction obstructing 12.5 mm barium tablet.  Last EGD 12/29/2019 - Candida esophagitis with + KOH, Schatzki's ring s/p dilation, medium size hiatal hernia, single Cameron erosion, normal duodenum.  Last seen in the office 02/11/2020.  He reported improvement in his swallowing after the dilation but only lasted for 2 weeks.  Was having trouble with foods like watermelon, bananas, can cheese.  Stated he was chewing his food properly but he did have poor dentition due to broken dentures.  Denies lack of appetite, had gained 10 pounds.  He has been having presyncopal episodes and was due to follow-up with cardiology in September 2021.  Was complaining of light headedness and dizziness upon standing.  Consideration given to repeat colonoscopy with concurrent EGD/EGD.  He was to get updated CBC and iron panel.   Most recent labs 07/28/21 -hemoglobin 13.7, MCV 88.1, platelets 229  Today:    Past Medical History:  Diagnosis Date   Cervical pain 11/21/2012   COPD (chronic obstructive pulmonary disease) (HCC)    DDD  (degenerative disc disease)    Esophageal dysphagia    Essential hypertension    Fracture of multiple ribs 11/01/2012   Fracture of occipital condyle ([place]) 11/21/2012   Headache    Hyperlipidemia    Inguinal hernia 12/19/2013   Microcytic anemia     Past Surgical History:  Procedure Laterality Date   BACK SURGERY     COLONOSCOPY WITH PROPOFOL N/A 06/15/2016   RMR: inadequate bowel prep all mucosal surfaces not well seen. 5 mm tubular adenoma removed from the hepatic flexure. 3 mm polyp from the rectum was hyperplastic. He had scattered diverticula   ESOPHAGEAL BRUSHING  12/29/2019   Procedure: ESOPHAGEAL BRUSHING;  Surgeon: Daneil Dolin, MD;  [facility];  Service: Endoscopy;;   ESOPHAGOGASTRODUODENOSCOPY (EGD) WITH PROPOFOL N/A 06/15/2016   Dr. Gala Romney: Large hiatal hernia, reflux esophagitis, Schatzki ring with focal area of ulceration, status post dilation with 52F Maloney dilator with moderate improvement in luminal narrowing.   ESOPHAGOGASTRODUODENOSCOPY (EGD) WITH PROPOFOL N/A 12/29/2019   Procedure: ESOPHAGOGASTRODUODENOSCOPY (EGD) WITH PROPOFOL;  Surgeon: Daneil Dolin, MD;  [facility];  Service: Endoscopy;  Laterality: N/A;  9:30am, PAT telling pt to arrive at [place] Left 07/24/2014   Procedure: LAPAROSCOPIC LEFT INGUINAL HERNIA REPAIR ;  Surgeon: Ralene Ok, MD;  Location: [facility] [facility];  Service: General;  Laterality: Left;   INGUINAL HERNIA REPAIR Right 08/17/2021   Procedure: HERNIA REPAIR INGUINAL ADULT W/ MESH;  Surgeon: Aviva Signs, MD;  Location: [facility];  Service: General;  Laterality: Right;  INSERTION OF MESH Left 07/24/2014   Procedure: INSERTION OF MESH;  Surgeon: Ralene Ok, MD;  Location: [place];  Service: General;  Laterality: Left;   LUMBAR FUSION     [place] N/A 06/15/2016   Procedure: Venia Minks DILATION;  Surgeon: Daneil Dolin, MD;  [facility];  Service: Endoscopy;  Laterality: N/A;   MALONEY  DILATION N/A 12/29/2019   Procedure: Venia Minks DILATION;  Surgeon: Daneil Dolin, MD;  [facility];  Service: Endoscopy;  Laterality: N/A;   POLYPECTOMY  06/15/2016   Procedure: POLYPECTOMY;  Surgeon: Daneil Dolin, MD;  [facility];  Service: Endoscopy;;  colon   right lung surgery Right     Current Outpatient Medications  Medication Sig Dispense Refill   albuterol (PROAIR HFA) 108 (90 Base) MCG/ACT inhaler Inhale 2 puffs into the lungs every 4 (four) hours as needed for wheezing or shortness of breath. 18 g 0   albuterol (PROVENTIL) (2.5 MG/3ML) 0.083% nebulizer solution Take 3 mLs (2.5 mg total) by nebulization every 6 (six) hours as needed for wheezing or shortness of breath. 75 mL 3   alfuzosin (UROXATRAL) 10 MG 24 hr tablet Take 1 tablet (10 mg total) by mouth at bedtime. 30 tablet 11   cholecalciferol (VITAMIN D3) 25 MCG (1000 UNIT) tablet Take 1,000 Units by mouth daily.     diazepam (VALIUM) 5 MG tablet Take 5 mg by mouth at bedtime.     ferrous sulfate 325 (65 FE) MG tablet Take 325 mg by mouth daily with breakfast.     FLUoxetine (PROZAC) 10 MG capsule Take 10 mg by mouth daily.     Fluticasone-Umeclidin-Vilant (TRELEGY ELLIPTA) 100-62.5-25 MCG/INH AEPB Inhale 1 puff into the lungs daily. 28 each 0   gabapentin (NEURONTIN) 600 MG tablet Take 600 mg by mouth 3 (three) times daily.     omeprazole (PRILOSEC) 20 MG capsule Take 20 mg by mouth daily.     oxyCODONE (ROXICODONE) 5 MG immediate release tablet Take 1 tablet (5 mg total) by mouth every 4 (four) hours as needed. 25 tablet 0   tamsulosin (FLOMAX) 0.4 MG CAPS capsule Take 0.4 mg by mouth daily.     traZODone (DESYREL) 50 MG tablet Take 50 mg by mouth at bedtime.     No current facility-administered medications for this visit.    Allergies as of 01/18/2022 - Review Complete 11/01/2021  Allergen Reaction Noted   Advil [ibuprofen] Diarrhea 07/20/2014   Aleve [naproxen sodium] Nausea And Vomiting  04/23/2011    Family History  Problem Relation Age of Onset   Diabetes Mother    Alzheimer's disease Mother    Diabetes Sister    Colon cancer Neg Hx    Liver disease Neg Hx     Social History   Socioeconomic History   Marital status: Single    Spouse name: Not on file   Number of children: Not on file   Years of education: Not on file   Highest education level: Not on file  Occupational History   Not on file  Tobacco Use   Smoking status: Every Day    Packs/day: 0.50    Years: 45.00    Total pack years: 22.50    Types: Cigarettes   Smokeless tobacco: Never  Vaping Use   Vaping Use: Never used  Substance and Sexual Activity   Alcohol use: Yes    Alcohol/week: 9.0 standard drinks of alcohol    Types: 9 Cans of  beer per week    Comment: occ beer   Drug use: Yes    Types: Marijuana    Comment: last marijuana was 1 month ago   Sexual activity: Not on file  Other Topics Concern   Not on file  Social History Narrative   Not on file   Social Determinants of Health   Financial Resource Strain: Not on file  Food Insecurity: Not on file  Transportation Needs: Not on file  Physical Activity: Not on file  Stress: Not on file  Social Connections: Not on file     Review of Systems   Gen: Denies fever, chills, anorexia. Denies fatigue, weakness, weight loss.  CV: Denies chest pain, palpitations, syncope, peripheral edema, and claudication. Resp: Denies dyspnea at rest, cough, wheezing, coughing up blood, and pleurisy. GI: see HPI Derm: Denies rash, itching, dry skin Psych: Denies depression, anxiety, memory loss, confusion. No homicidal or suicidal ideation.  Heme: Denies bruising, bleeding, and enlarged lymph nodes.   Physical Exam   There were no vitals taken for this visit.  General:   Alert and oriented. No distress noted. Pleasant and cooperative.  Head:  Normocephalic and atraumatic. Eyes:  Conjuctiva clear without scleral icterus. Mouth:  Oral mucosa  pink and moist. Good dentition. No lesions. Lungs:  Clear to auscultation bilaterally. No wheezes, rales, or rhonchi. No distress.  Heart:  S1, S2 present without murmurs appreciated.  Abdomen:  +BS, soft, non-tender and non-distended. No rebound or guarding. No HSM or masses noted. Rectal: *** Msk:  Symmetrical without gross deformities. Normal posture. Extremities:  Without edema. Neurologic:  Alert and  oriented x4 Psych:  Alert and cooperative. Normal mood and affect.   Assessment  SHANDELL JALLOW is a 61 y.o. male with a history of COPD, HTN, HLD, anemia, and chronic dysphagia*** presenting today with dysphagia.  History of colon polyps: Last colonoscopy with inadequate prep and hyperplastic polyp x1 and 1 tubular adenoma in 2017.   Dysphagia:   PLAN   ***     Venetia Night, MSN, FNP-BC, AGACNP-BC [facility]

## 2022-01-18 ENCOUNTER — Inpatient Hospital Stay: Payer: Medicaid Other | Admitting: Gastroenterology

## 2022-02-16 ENCOUNTER — Ambulatory Visit (INDEPENDENT_AMBULATORY_CARE_PROVIDER_SITE_OTHER): Payer: Medicaid Other | Admitting: Gastroenterology

## 2022-02-16 ENCOUNTER — Encounter: Payer: Self-pay | Admitting: Gastroenterology

## 2022-02-16 ENCOUNTER — Other Ambulatory Visit: Payer: Self-pay

## 2022-02-16 ENCOUNTER — Encounter (HOSPITAL_COMMUNITY): Payer: Self-pay | Admitting: Emergency Medicine

## 2022-02-16 ENCOUNTER — Emergency Department (HOSPITAL_COMMUNITY)
Admission: EM | Admit: 2022-02-16 | Discharge: 2022-02-16 | Disposition: A | Discharge status: Home / Self Care | Payer: Medicaid Other | Attending: Emergency Medicine | Admitting: Emergency Medicine

## 2022-02-16 ENCOUNTER — Emergency Department (HOSPITAL_COMMUNITY): Payer: Medicaid Other

## 2022-02-16 ENCOUNTER — Telehealth: Payer: Self-pay | Admitting: *Deleted

## 2022-02-16 VITALS — BP 126/74 | HR 87 | Temp 97.9°F | Ht 73.0 in | Wt 169.0 lb

## 2022-02-16 DIAGNOSIS — J441 Chronic obstructive pulmonary disease with (acute) exacerbation: Secondary | ICD-10-CM | POA: Diagnosis not present

## 2022-02-16 DIAGNOSIS — Z7951 Long term (current) use of inhaled steroids: Secondary | ICD-10-CM | POA: Insufficient documentation

## 2022-02-16 DIAGNOSIS — I10 Essential (primary) hypertension: Secondary | ICD-10-CM | POA: Diagnosis not present

## 2022-02-16 DIAGNOSIS — R1310 Dysphagia, unspecified: Secondary | ICD-10-CM | POA: Diagnosis not present

## 2022-02-16 DIAGNOSIS — D509 Iron deficiency anemia, unspecified: Secondary | ICD-10-CM

## 2022-02-16 DIAGNOSIS — R0602 Shortness of breath: Secondary | ICD-10-CM | POA: Diagnosis present

## 2022-02-16 LAB — BRAIN NATRIURETIC PEPTIDE: B Natriuretic Peptide: 28 pg/mL (ref 0.0–100.0)

## 2022-02-16 LAB — CBC WITH DIFFERENTIAL/PLATELET
Abs Immature Granulocytes: 0.03 10*3/uL (ref 0.00–0.07)
Basophils Absolute: 0.1 10*3/uL (ref 0.0–0.1)
Basophils Relative: 1 %
Eosinophils Absolute: 1 10*3/uL — ABNORMAL HIGH (ref 0.0–0.5)
Eosinophils Relative: 8 %
HCT: 40.7 % (ref 39.0–52.0)
Hemoglobin: 12.8 g/dL — ABNORMAL LOW (ref 13.0–17.0)
Immature Granulocytes: 0 %
Lymphocytes Relative: 38 %
Lymphs Abs: 4.4 10*3/uL — ABNORMAL HIGH (ref 0.7–4.0)
MCH: 27.4 pg (ref 26.0–34.0)
MCHC: 31.4 g/dL (ref 30.0–36.0)
MCV: 87.2 fL (ref 80.0–100.0)
Monocytes Absolute: 0.8 10*3/uL (ref 0.1–1.0)
Monocytes Relative: 7 %
Neutro Abs: 5.4 10*3/uL (ref 1.7–7.7)
Neutrophils Relative %: 46 %
Platelets: 262 10*3/uL (ref 150–400)
RBC: 4.67 MIL/uL (ref 4.22–5.81)
RDW: 15.6 % — ABNORMAL HIGH (ref 11.5–15.5)
WBC: 11.7 10*3/uL — ABNORMAL HIGH (ref 4.0–10.5)
nRBC: 0 % (ref 0.0–0.2)

## 2022-02-16 LAB — BASIC METABOLIC PANEL
Anion gap: 5 (ref 5–15)
BUN: 10 mg/dL (ref 8–23)
CO2: 26 mmol/L (ref 22–32)
Calcium: 8.6 mg/dL — ABNORMAL LOW (ref 8.9–10.3)
Chloride: 106 mmol/L (ref 98–111)
Creatinine, Ser: 0.82 mg/dL (ref 0.61–1.24)
GFR, Estimated: >60 mL/min (ref >60)
Glucose, Bld: 148 mg/dL — ABNORMAL HIGH (ref 70–99)
Potassium: 3.6 mmol/L (ref 3.5–5.1)
Sodium: 137 mmol/L (ref 135–145)

## 2022-02-16 MED ORDER — DEXAMETHASONE SODIUM PHOSPHATE 10 MG/ML IJ SOLN
10.0000 mg | Freq: Once | INTRAMUSCULAR | Status: AC
Start: 2022-02-16 — End: 2022-02-16
  Administered 2022-02-16: 10 mg via INTRAVENOUS
  Filled 2022-02-16: qty 1

## 2022-02-16 MED ORDER — ALBUTEROL SULFATE HFA 108 (90 BASE) MCG/ACT IN AERS: 2.0000 | INHALATION_SPRAY | RESPIRATORY_TRACT | 0 refills | Status: DC | PRN

## 2022-02-16 MED ORDER — ALBUTEROL SULFATE (2.5 MG/3ML) 0.083% IN NEBU
15.0000 mg/h | INHALATION_SOLUTION | Freq: Once | RESPIRATORY_TRACT | Status: AC
Start: 2022-02-16 — End: 2022-02-16
  Administered 2022-02-16: 15 mg/h via RESPIRATORY_TRACT
  Filled 2022-02-16: qty 18

## 2022-02-16 NOTE — Telephone Encounter (Signed)
Called pt to schedule his EGD +/- dil. With Dr. Gala Romney, ASA 3.His sister states he was not there and would have him call back.Marland Kitchen

## 2022-02-16 NOTE — Progress Notes (Signed)
Referring Provider: No ref. provider found Primary Care Physician:  Pcp, No Primary GI Physician: Dr. Gala Romney  Chief Complaint  Patient presents with   Dysphagia    Can't eat throat hurts and can't swallow.    HPI:   Sean Thomas is a 61 y.o. male with history of COPD, HTN, HLD, IDA, dysphagia, who walked into the office today requesting to be seen acutely for dysphagia.  Notably, he was seen in the emergency room earlier today for COPD exacerbation.  He was given Decadron and DuoNeb, discharged on albuterol inhaler.  Patient states his dysphagia symptoms have been worsening over the last few months.  Unable to tolerate soft foods and liquids.  Anything solid seems to be getting stuck in his esophagus and sometimes soft foods do as well.  Pills also getting hung.  States it takes him hours to get items back up.  If drinking something, does not push solid items down.  Otherwise, liquids will pass fine.  Denies heartburn, nausea, vomiting, abdominal pain.  Due to inability to eat secondary to dysphagia, he has lost about 10 pounds.  States he needs his esophagus stretched as soon as possible.  Denies BRBPR, melena, bowel habit changes. He does take iron every once in a while.  Regarding his shortness of breath, he states he does feel better since being seen in the emergency room.  He has chronic shortness of breath in setting of COPD and uses Trelegy daily.  Used to be on oxygen, but has not required this for a few years.  Does not currently have a PCP.    Prior GI evaluation: Barium pill esophagram on 12/25/2019 showed premature spillover of contrast with laryngeal penetration and aspiration of contrast down the posterior wall of the trachea.  Weak spontaneous cough reflex.  Nonobstructive cervical esophageal web.  Stricture at the GE junction, obstructing a 12.5 mm barium tablet, mucosa at the site minimally irregular.   EGD June 2021 showed Candida esophagitis, Schatzki ring status  post dilation, medium sized hiatal hernia with a single Cameron lesion.  KOH positive, treated with Diflucan for 21 days.  Attempted colonoscopy in 2017 but had an inadequate prep November 2017, 36m tubular adenoma removed but all mucosal surfaces not well seen.    Last seen in our office 02/11/2020.  Reported some improvement in swallowing after dilation, but only lasted a couple weeks.  Trouble with foods like watermelon, banana, can cheese.  Chews thoroughly.  Had poor dentition with broken dentures.  He was to be scheduled for repeat EGD with dilation as well as colonoscopy due to IDA and history of polyps, but we were never able to reach patient.   Past Medical History:  Diagnosis Date   Cervical pain 11/21/2012   COPD (chronic obstructive pulmonary disease) (HCC)    DDD (degenerative disc disease)    Esophageal dysphagia    Essential hypertension    Fracture of multiple ribs 11/01/2012   [place]   Headache    Hyperlipidemia    Inguinal hernia 12/19/2013   Microcytic anemia     Past Surgical History:  Procedure Laterality Date   BACK SURGERY     COLONOSCOPY WITH PROPOFOL N/A 06/15/2016   RMR: inadequate bowel prep all mucosal surfaces not well seen. 5 mm tubular adenoma removed from the hepatic flexure. 3 mm polyp from the rectum was hyperplastic. He had scattered diverticula   ESOPHAGEAL BRUSHING  12/29/2019   Procedure: ESOPHAGEAL BRUSHING;  Surgeon: Daneil Dolin, MD;  [facility];  Service: Endoscopy;;   ESOPHAGOGASTRODUODENOSCOPY (EGD) WITH PROPOFOL N/A 06/15/2016   Dr. Gala Romney: Large hiatal hernia, reflux esophagitis, Schatzki ring with focal area of ulceration, status post dilation with 56F Maloney dilator with moderate improvement in luminal narrowing.   ESOPHAGOGASTRODUODENOSCOPY (EGD) WITH PROPOFOL N/A 12/29/2019   Surgeon: Daneil Dolin, MD; Candida esophagitis, Schatzki ring status post dilation, medium sized hiatal hernia  with a single Cameron lesion.  KOH positive, treated with Diflucan for 21 days.   INGUINAL HERNIA REPAIR Left 07/24/2014   Procedure: LAPAROSCOPIC LEFT INGUINAL HERNIA REPAIR ;  Surgeon: Ralene Ok, MD;  Location: [place];  Service: General;  Laterality: Left;   INGUINAL HERNIA REPAIR Right 08/17/2021   Procedure: HERNIA REPAIR INGUINAL ADULT W/ MESH;  Surgeon: Aviva Signs, MD;  [facility];  Service: General;  Laterality: Right;   INSERTION OF MESH Left 07/24/2014   Procedure: INSERTION OF MESH;  Surgeon: Ralene Ok, MD;  Location: [place];  Service: General;  Laterality: Left;   [place] N/A 06/15/2016   Procedure: Venia Minks DILATION;  Surgeon: Daneil Dolin, MD;  [facility];  Service: Endoscopy;  Laterality: N/A;   MALONEY DILATION N/A 12/29/2019   Procedure: Venia Minks DILATION;  Surgeon: Daneil Dolin, MD;  [facility];  Service: Endoscopy;  Laterality: N/A;   POLYPECTOMY  06/15/2016   Procedure: POLYPECTOMY;  Surgeon: Daneil Dolin, MD;  [facility];  Service: Endoscopy;;  colon   right lung surgery Right     Current Outpatient Medications  Medication Sig Dispense Refill   ferrous sulfate 325 (65 FE) MG tablet Take 325 mg by mouth daily with breakfast.     Fluticasone-Umeclidin-Vilant (TRELEGY ELLIPTA) 100-62.5-25 MCG/INH AEPB Inhale 1 puff into the lungs daily. 28 each 0   gabapentin (NEURONTIN) 600 MG tablet Take 600 mg by mouth 3 (three) times daily.     albuterol (PROAIR HFA) 108 (90 Base) MCG/ACT inhaler Inhale 2 puffs into the lungs every 4 (four) hours as needed for wheezing or shortness of breath. (Patient not taking: Reported on 02/16/2022) 18 g 0   albuterol (PROVENTIL) (2.5 MG/3ML) 0.083% nebulizer solution Take 3 mLs (2.5 mg total) by nebulization every 6 (six) hours as needed for wheezing or shortness of breath. (Patient not taking: Reported on 02/16/2022) 75 mL 3   albuterol (VENTOLIN HFA) 108 (90 Base)  MCG/ACT inhaler Inhale 2 puffs into the lungs every 4 (four) hours as needed for wheezing or shortness of breath. (Patient not taking: Reported on 02/16/2022) 1 each 0   No current facility-administered medications for this visit.    Allergies as of 02/16/2022 - Review Complete 02/16/2022  Allergen Reaction Noted   Advil [ibuprofen] Diarrhea 07/20/2014   Aleve [naproxen sodium] Nausea And Vomiting 04/23/2011    Family History  Problem Relation Age of Onset   Diabetes Mother    Alzheimer's disease Mother    Diabetes Sister    Colon cancer Neg Hx    Liver disease Neg Hx     Social History   Socioeconomic History   Marital status: Single    Spouse name: Not on file   Number of children: Not on file   Years of education: Not on file   Highest education level: Not on file  Occupational History   Not on file  Tobacco Use   Smoking status: Every Day  Packs/day: 0.50    Years: 45.00    Total pack years: 22.50    Types: Cigarettes   Smokeless tobacco: Never  Vaping Use   Vaping Use: Never used  Substance and Sexual Activity   Alcohol use: Yes    Alcohol/week: 9.0 standard drinks of alcohol    Types: 9 Cans of beer per week    Comment: occ beer   Drug use: Yes    Types: Marijuana    Comment: last marijuana was 1 month ago   Sexual activity: Not on file  Other Topics Concern   Not on file  Social History Narrative   Not on file   Social Determinants of Health   Financial Resource Strain: Not on file  Food Insecurity: Not on file  Transportation Needs: Not on file  Physical Activity: Not on file  Stress: Not on file  Social Connections: Not on file    Review of Systems: Gen: Denies fever, chills, cold or flulike symptoms, presyncope, syncope. CV: Denies chest pain, palpitations. Resp: Admits to chronic shortness of breath and intermittent cough. GI: See HPI   Heme: \See HPI  Physical Exam: BP 126/74 (BP Location: Right Arm, Patient Position: Sitting, Cuff  Size: Normal)   Pulse 87   Temp 97.9 F (36.6 C) (Temporal)   Ht '6\' 1"'$  (1.854 m)   Wt 169 lb (76.7 kg)   BMI 22.30 kg/m  General:   Alert and oriented. No distress noted. Pleasant and cooperative.  Head:  Normocephalic and atraumatic. Eyes:  Conjuctiva clear without scleral icterus. Heart:  S1, S2 present without murmurs appreciated. Lungs:  Clear to auscultation bilaterally. No wheezes, rales, or rhonchi. No distress.  Abdomen:  +BS, soft, non-tender and non-distended. No rebound or guarding. No HSM or masses noted. Msk:  Symmetrical without gross deformities. Normal posture. Extremities:  Without edema. Neurologic:  Alert and  oriented x4 Psych:  Normal mood and affect.    Assessment:  61 y.o. male with history of COPD, HTN, HLD, IDA, dysphagia, who walked into the office today requesting to be seen acutely for dysphagia.  Dysphagia: Chronic history of dysphagia.  Prior evaluation includes BPE in June 2021 revealing premature spillover of contrast with laryngeal penetration and aspiration of contrast down to the posterior wall of the trachea, weak spontaneous cough reflex, nonobstructive cervical esophageal web, stricture at GE junction obstructing barium tablet with minimal irregular mucosa in this area.  Follow-up EGD in June 2021 with Candida esophagitis, Schatzki's ring s/p dilation, medium sized hiatal hernia with single Lysbeth Galas lesion.  KOH positive and treated for Diflucan for 21 days.  We previously tried to reschedule him for EGD in August 2021 due to ongoing dysphagia, but were ultimately unable to reach patient.  Today he is reporting worsening dysphagia symptoms over the last couple months, only able to tolerate soft foods and liquids.  Solid foods, pills, and sometimes soft foods get hung in his esophagus requiring regurgitation.  Due to significant dysphagia hindering p.o. intake, he is also about 10 pounds.  Denies heartburn though he has history of reflux esophagitis on EGD  in 2017. Not currently on a PPI.   I recommended EGD ASAP for further evaluation with differentials including esophageal web, ring, stricture, malignancy.  Unfortunately, patient was in a hurry to leave the office today and requested we call him to schedule his procedure.  I encouraged him to stay to get scheduled as we previously had trouble reaching him by phone, but patient left anyways.  He likely needs to be on a PPI, but will hold off on this until EGD.    IDA: We previously saw patient in 2017 for iron deficiency anemia.  He underwent EGD in 2017 revealing large hiatal hernia, reflux esophagitis, Schatzki's ring with focal area of ulceration.  Attempted colonoscopy at the same time, but this was incomplete due to poor prep.  He did have a 5 mm tubular adenoma removed.  He has not yet completed repeat colonoscopy as previously recommended.  Most recent EGD in June 2021 with Candida esophagitis, Schatzki's ring, medium size hiatal hernia with single Cameron lesion.  He was treated with Diflucan.  He is not currently on a PPI and denies heartburn symptoms though does have dysphagia discussed above.  Denies overt GI bleeding.  Takes oral iron every now and then.  Appears his hemoglobin normalized in April 2022 and remained in the upper 13 range, but most recent hemoglobin earlier this morning was slightly low at 12.8.  He needs first-ever complete colonoscopy, but due to significant dysphagia discussed above, he only wants to proceed with EGD at this time. I will update an iron panel.   Plan:  Proceed with upper endoscopy +/- dilation with propofol by Dr. Gala Romney ASAP. The risks, benefits, and alternatives have been discussed with the patient in detail. The patient states understanding and desires to proceed. ASA 3 Iron panel with ferritin Soft diet.  Separate handout provided. Recommended presenting to emergency room if something gets stuck in his esophagus and will not come up or go down, or if he is  unable to tolerate liquids. Hold off on starting PPI until EGD.  Follow-up after EGD.   Aliene Altes, PA-C [facility] 02/16/2022

## 2022-02-16 NOTE — H&P (View-Only) (Signed)
Referring Provider: No ref. provider found Primary Care Physician:  Pcp, No Primary GI Physician: Dr. Gala Romney  Chief Complaint  Patient presents with   Dysphagia    Can't eat throat hurts and can't swallow.    HPI:   Sean Thomas is a 61 y.o. male with history of COPD, HTN, HLD, IDA, dysphagia, who walked into the office today requesting to be seen acutely for dysphagia.  Notably, he was seen in the emergency room earlier today for COPD exacerbation.  He was given Decadron and DuoNeb, discharged on albuterol inhaler.  Patient states his dysphagia symptoms have been worsening over the last few months.  Unable to tolerate soft foods and liquids.  Anything solid seems to be getting stuck in his esophagus and sometimes soft foods do as well.  Pills also getting hung.  States it takes him hours to get items back up.  If drinking something, does not push solid items down.  Otherwise, liquids will pass fine.  Denies heartburn, nausea, vomiting, abdominal pain.  Due to inability to eat secondary to dysphagia, he has lost about 10 pounds.  States he needs his esophagus stretched as soon as possible.  Denies BRBPR, melena, bowel habit changes. He does take iron every once in a while.  Regarding his shortness of breath, he states he does feel better since being seen in the emergency room.  He has chronic shortness of breath in setting of COPD and uses Trelegy daily.  Used to be on oxygen, but has not required this for a few years.  Does not currently have a PCP.    Prior GI evaluation: Barium pill esophagram on 12/25/2019 showed premature spillover of contrast with laryngeal penetration and aspiration of contrast down the posterior wall of the trachea.  Weak spontaneous cough reflex.  Nonobstructive cervical esophageal web.  Stricture at the GE junction, obstructing a 12.5 mm barium tablet, mucosa at the site minimally irregular.   EGD June 2021 showed Candida esophagitis, Schatzki ring status  post dilation, medium sized hiatal hernia with a single Cameron lesion.  KOH positive, treated with Diflucan for 21 days.  Attempted colonoscopy in 2017 but had an inadequate prep November 2017, 63m tubular adenoma removed but all mucosal surfaces not well seen.    Last seen in our office 02/11/2020.  Reported some improvement in swallowing after dilation, but only lasted a couple weeks.  Trouble with foods like watermelon, banana, can cheese.  Chews thoroughly.  Had poor dentition with broken dentures.  He was to be scheduled for repeat EGD with dilation as well as colonoscopy due to IDA and history of polyps, but we were never able to reach patient.   Past Medical History:  Diagnosis Date   Cervical pain 11/21/2012   COPD (chronic obstructive pulmonary disease) (HCC)    DDD (degenerative disc disease)    Esophageal dysphagia    Essential hypertension    Fracture of multiple ribs 11/01/2012   [place]   Headache    Hyperlipidemia    Inguinal hernia 12/19/2013   Microcytic anemia     Past Surgical History:  Procedure Laterality Date   BACK SURGERY     COLONOSCOPY WITH PROPOFOL N/A 06/15/2016   RMR: inadequate bowel prep all mucosal surfaces not well seen. 5 mm tubular adenoma removed from the hepatic flexure. 3 mm polyp from the rectum was hyperplastic. He had scattered diverticula   ESOPHAGEAL BRUSHING  12/29/2019   Procedure: ESOPHAGEAL BRUSHING;  Surgeon: Daneil Dolin, MD;  [facility];  Service: Endoscopy;;   ESOPHAGOGASTRODUODENOSCOPY (EGD) WITH PROPOFOL N/A 06/15/2016   Dr. Gala Romney: Large hiatal hernia, reflux esophagitis, Schatzki ring with focal area of ulceration, status post dilation with 80F Maloney dilator with moderate improvement in luminal narrowing.   ESOPHAGOGASTRODUODENOSCOPY (EGD) WITH PROPOFOL N/A 12/29/2019   Surgeon: Daneil Dolin, MD; Candida esophagitis, Schatzki ring status post dilation, medium sized hiatal hernia  with a single Cameron lesion.  KOH positive, treated with Diflucan for 21 days.   INGUINAL HERNIA REPAIR Left 07/24/2014   Procedure: LAPAROSCOPIC LEFT INGUINAL HERNIA REPAIR ;  Surgeon: Ralene Ok, MD;  Location: [place];  Service: General;  Laterality: Left;   INGUINAL HERNIA REPAIR Right 08/17/2021   Procedure: HERNIA REPAIR INGUINAL ADULT W/ MESH;  Surgeon: Aviva Signs, MD;  [facility];  Service: General;  Laterality: Right;   INSERTION OF MESH Left 07/24/2014   Procedure: INSERTION OF MESH;  Surgeon: Ralene Ok, MD;  Location: [place];  Service: General;  Laterality: Left;   [place] N/A 06/15/2016   Procedure: Venia Minks DILATION;  Surgeon: Daneil Dolin, MD;  [facility];  Service: Endoscopy;  Laterality: N/A;   MALONEY DILATION N/A 12/29/2019   Procedure: Venia Minks DILATION;  Surgeon: Daneil Dolin, MD;  [facility];  Service: Endoscopy;  Laterality: N/A;   POLYPECTOMY  06/15/2016   Procedure: POLYPECTOMY;  Surgeon: Daneil Dolin, MD;  [facility];  Service: Endoscopy;;  colon   right lung surgery Right     Current Outpatient Medications  Medication Sig Dispense Refill   ferrous sulfate 325 (65 FE) MG tablet Take 325 mg by mouth daily with breakfast.     Fluticasone-Umeclidin-Vilant (TRELEGY ELLIPTA) 100-62.5-25 MCG/INH AEPB Inhale 1 puff into the lungs daily. 28 each 0   gabapentin (NEURONTIN) 600 MG tablet Take 600 mg by mouth 3 (three) times daily.     albuterol (PROAIR HFA) 108 (90 Base) MCG/ACT inhaler Inhale 2 puffs into the lungs every 4 (four) hours as needed for wheezing or shortness of breath. (Patient not taking: Reported on 02/16/2022) 18 g 0   albuterol (PROVENTIL) (2.5 MG/3ML) 0.083% nebulizer solution Take 3 mLs (2.5 mg total) by nebulization every 6 (six) hours as needed for wheezing or shortness of breath. (Patient not taking: Reported on 02/16/2022) 75 mL 3   albuterol (VENTOLIN HFA) 108 (90 Base)  MCG/ACT inhaler Inhale 2 puffs into the lungs every 4 (four) hours as needed for wheezing or shortness of breath. (Patient not taking: Reported on 02/16/2022) 1 each 0   No current facility-administered medications for this visit.    Allergies as of 02/16/2022 - Review Complete 02/16/2022  Allergen Reaction Noted   Advil [ibuprofen] Diarrhea 07/20/2014   Aleve [naproxen sodium] Nausea And Vomiting 04/23/2011    Family History  Problem Relation Age of Onset   Diabetes Mother    Alzheimer's disease Mother    Diabetes Sister    Colon cancer Neg Hx    Liver disease Neg Hx     Social History   Socioeconomic History   Marital status: Single    Spouse name: Not on file   Number of children: Not on file   Years of education: Not on file   Highest education level: Not on file  Occupational History   Not on file  Tobacco Use   Smoking status: Every Day  Packs/day: 0.50    Years: 45.00    Total pack years: 22.50    Types: Cigarettes   Smokeless tobacco: Never  Vaping Use   Vaping Use: Never used  Substance and Sexual Activity   Alcohol use: Yes    Alcohol/week: 9.0 standard drinks of alcohol    Types: 9 Cans of beer per week    Comment: occ beer   Drug use: Yes    Types: Marijuana    Comment: last marijuana was 1 month ago   Sexual activity: Not on file  Other Topics Concern   Not on file  Social History Narrative   Not on file   Social Determinants of Health   Financial Resource Strain: Not on file  Food Insecurity: Not on file  Transportation Needs: Not on file  Physical Activity: Not on file  Stress: Not on file  Social Connections: Not on file    Review of Systems: Gen: Denies fever, chills, cold or flulike symptoms, presyncope, syncope. CV: Denies chest pain, palpitations. Resp: Admits to chronic shortness of breath and intermittent cough. GI: See HPI   Heme: \See HPI  Physical Exam: BP 126/74 (BP Location: Right Arm, Patient Position: Sitting, Cuff  Size: Normal)   Pulse 87   Temp 97.9 F (36.6 C) (Temporal)   Ht '6\' 1"'$  (1.854 m)   Wt 169 lb (76.7 kg)   BMI 22.30 kg/m  General:   Alert and oriented. No distress noted. Pleasant and cooperative.  Head:  Normocephalic and atraumatic. Eyes:  Conjuctiva clear without scleral icterus. Heart:  S1, S2 present without murmurs appreciated. Lungs:  Clear to auscultation bilaterally. No wheezes, rales, or rhonchi. No distress.  Abdomen:  +BS, soft, non-tender and non-distended. No rebound or guarding. No HSM or masses noted. Msk:  Symmetrical without gross deformities. Normal posture. Extremities:  Without edema. Neurologic:  Alert and  oriented x4 Psych:  Normal mood and affect.    Assessment:  61 y.o. male with history of COPD, HTN, HLD, IDA, dysphagia, who walked into the office today requesting to be seen acutely for dysphagia.  Dysphagia: Chronic history of dysphagia.  Prior evaluation includes BPE in June 2021 revealing premature spillover of contrast with laryngeal penetration and aspiration of contrast down to the posterior wall of the trachea, weak spontaneous cough reflex, nonobstructive cervical esophageal web, stricture at GE junction obstructing barium tablet with minimal irregular mucosa in this area.  Follow-up EGD in June 2021 with Candida esophagitis, Schatzki's ring s/p dilation, medium sized hiatal hernia with single Sean Thomas lesion.  KOH positive and treated for Diflucan for 21 days.  We previously tried to reschedule him for EGD in August 2021 due to ongoing dysphagia, but were ultimately unable to reach patient.  Today he is reporting worsening dysphagia symptoms over the last couple months, only able to tolerate soft foods and liquids.  Solid foods, pills, and sometimes soft foods get hung in his esophagus requiring regurgitation.  Due to significant dysphagia hindering p.o. intake, he is also about 10 pounds.  Denies heartburn though he has history of reflux esophagitis on EGD  in 2017. Not currently on a PPI.   I recommended EGD ASAP for further evaluation with differentials including esophageal web, ring, stricture, malignancy.  Unfortunately, patient was in a hurry to leave the office today and requested we call him to schedule his procedure.  I encouraged him to stay to get scheduled as we previously had trouble reaching him by phone, but patient left anyways.  He likely needs to be on a PPI, but will hold off on this until EGD.    IDA: We previously saw patient in 2017 for iron deficiency anemia.  He underwent EGD in 2017 revealing large hiatal hernia, reflux esophagitis, Schatzki's ring with focal area of ulceration.  Attempted colonoscopy at the same time, but this was incomplete due to poor prep.  He did have a 5 mm tubular adenoma removed.  He has not yet completed repeat colonoscopy as previously recommended.  Most recent EGD in June 2021 with Candida esophagitis, Schatzki's ring, medium size hiatal hernia with single Cameron lesion.  He was treated with Diflucan.  He is not currently on a PPI and denies heartburn symptoms though does have dysphagia discussed above.  Denies overt GI bleeding.  Takes oral iron every now and then.  Appears his hemoglobin normalized in April 2022 and remained in the upper 13 range, but most recent hemoglobin earlier this morning was slightly low at 12.8.  He needs first-ever complete colonoscopy, but due to significant dysphagia discussed above, he only wants to proceed with EGD at this time. I will update an iron panel.   Plan:  Proceed with upper endoscopy +/- dilation with propofol by Dr. Gala Romney ASAP. The risks, benefits, and alternatives have been discussed with the patient in detail. The patient states understanding and desires to proceed. ASA 3 Iron panel with ferritin Soft diet.  Separate handout provided. Recommended presenting to emergency room if something gets stuck in his esophagus and will not come up or go down, or if he is  unable to tolerate liquids. Hold off on starting PPI until EGD.  Follow-up after EGD.   Aliene Altes, PA-C [facility] 02/16/2022

## 2022-02-16 NOTE — Discharge Instructions (Signed)
You were seen today for shortness of breath.  Your work-up today is reassuring.  You likely are experiencing a COPD exacerbation.  You should avoid smoking.  Use your albuterol every 4 hours.

## 2022-02-16 NOTE — Patient Instructions (Addendum)
We will arrange for you to have an upper endoscopy with stretching of your esophagus in the near future with Dr. Gala Romney.  For now, I recommend you follow a soft diet consuming items such as mashed potatoes, applesauce, yogurt, pudding, ice cream, Jell-O, etc. You should also consume a couple of protein shakes a day to help maintain adequate nutritional intake.  If something gets stuck in your esophagus and will not come up or go down, or if you are unable to tolerate liquids, you need to proceed to the emergency room.  We will see you back after your upper endoscopy.  Aliene Altes, PA-C [facility]

## 2022-02-16 NOTE — ED Notes (Signed)
Pt SPO2 stayed 93%-95% while ambulating.

## 2022-02-16 NOTE — ED Provider Notes (Signed)
[facility] EMERGENCY DEPARTMENT Provider Note   CSN: [id number] Arrival date & time: 02/16/22  0153     History  Chief Complaint  Patient presents with   Shortness of Sean Thomas is a 61 y.o. male.  HPI     This is a 61 year old male with history of COPD who presents with shortness of breath and cough.  Patient reports 3-day history of worsening shortness of breath.  He states he has been using his inhaler with minimal relief.  He has not smoked as much over the last 3 days because he has not felt well.  He has chronic left-sided chest pain which is unchanged from prior secondary to rib fractures.  No new chest discomfort.  Denies any recent cough or fevers.  He has not noted any lower extremity swelling.  Home Medications Prior to Admission medications   Medication Sig Start Date End Date Taking? Authorizing Provider  albuterol (VENTOLIN HFA) 108 (90 Base) MCG/ACT inhaler Inhale 2 puffs into the lungs every 4 (four) hours as needed for wheezing or shortness of breath. 02/16/22  Yes Jakobi Thetford, Barbette Hair, MD  albuterol (PROAIR HFA) 108 (90 Base) MCG/ACT inhaler Inhale 2 puffs into the lungs every 4 (four) hours as needed for wheezing or shortness of breath. 11/01/20   Kinnie Feil, PA-C  albuterol (PROVENTIL) (2.5 MG/3ML) 0.083% nebulizer solution Take 3 mLs (2.5 mg total) by nebulization every 6 (six) hours as needed for wheezing or shortness of breath. 10/04/16   Nat Christen, MD  alfuzosin (UROXATRAL) 10 MG 24 hr tablet Take 1 tablet (10 mg total) by mouth at bedtime. 04/06/21   McKenzie, Candee Furbish, MD  cholecalciferol (VITAMIN D3) 25 MCG (1000 UNIT) tablet Take 1,000 Units by mouth daily.    [provider]  diazepam (VALIUM) 5 MG tablet Take 5 mg by mouth at bedtime. 03/14/21   [provider]  ferrous sulfate 325 (65 FE) MG tablet Take 325 mg by mouth daily with breakfast.    [provider]  FLUoxetine (PROZAC) 10 MG capsule Take 10 mg by  mouth daily. 03/14/21   [provider]  Fluticasone-Umeclidin-Vilant (TRELEGY ELLIPTA) 100-62.5-25 MCG/INH AEPB Inhale 1 puff into the lungs daily. 11/01/20   Kinnie Feil, PA-C  gabapentin (NEURONTIN) 600 MG tablet Take 600 mg by mouth 3 (three) times daily. 03/14/21   [provider]  omeprazole (PRILOSEC) 20 MG capsule Take 20 mg by mouth daily. 01/15/20   [provider]  oxyCODONE (ROXICODONE) 5 MG immediate release tablet Take 1 tablet (5 mg total) by mouth every 4 (four) hours as needed. 08/17/21 08/17/22  Aviva Signs, MD  tamsulosin (FLOMAX) 0.4 MG CAPS capsule Take 0.4 mg by mouth daily. 12/16/19   [provider]  traZODone (DESYREL) 50 MG tablet Take 50 mg by mouth at bedtime.    [provider]      Allergies    Advil [ibuprofen] and Aleve [naproxen sodium]    Review of Systems   Review of Systems  Constitutional:  Negative for fever.  Respiratory:  Positive for cough and shortness of breath.   All other systems reviewed and are negative.   Physical Exam Updated Vital Signs BP 130/83   Pulse 75   Temp 97.6 F (36.4 C) (Oral)   Resp 20   Ht 1.88 m ('6\' 2"'$ )   Wt 77.1 kg   SpO2 98%   BMI 21.83 kg/m  Physical Exam Vitals and nursing  note reviewed.  Constitutional:      Appearance: He is well-developed.     Comments: Chronically ill-appearing but nontoxic  HENT:     Head: Normocephalic and atraumatic.  Eyes:     Pupils: Pupils are equal, round, and reactive to light.  Cardiovascular:     Rate and Rhythm: Normal rate and regular rhythm.     Heart sounds: Normal heart sounds. No murmur heard. Pulmonary:     Effort: Pulmonary effort is normal. No respiratory distress.     Breath sounds: Wheezing present.     Comments: Tight, scattered wheeze, speaking in full sentences Abdominal:     General: Bowel sounds are normal.     Palpations: Abdomen is soft.     Tenderness: There is no abdominal tenderness. There is no rebound.   Musculoskeletal:     Cervical back: Neck supple.     Right lower leg: No edema.     Left lower leg: No edema.  Lymphadenopathy:     Cervical: No cervical adenopathy.  Skin:    General: Skin is warm and dry.  Neurological:     Mental Status: He is alert and oriented to person, place, and time.  Psychiatric:        Mood and Affect: Mood normal.     ED Results / Procedures / Treatments   Labs (all labs ordered are listed, but only abnormal results are displayed) Labs Reviewed  CBC WITH DIFFERENTIAL/PLATELET - Abnormal; Notable for the following components:      Result Value   WBC 11.7 (*)    Hemoglobin 12.8 (*)    RDW 15.6 (*)    Lymphs Abs 4.4 (*)    Eosinophils Absolute 1.0 (*)    All other components within normal limits  BASIC METABOLIC PANEL - Abnormal; Notable for the following components:   Glucose, Bld 148 (*)    Calcium 8.6 (*)    All other components within normal limits  BRAIN NATRIURETIC PEPTIDE    EKG EKG Interpretation  Date/Time:  Thursday February 16 2022 02:05:06 EDT Ventricular Rate:  76 PR Interval:  164 QRS Duration: 101 QT Interval:  385 QTC Calculation: 433 R Axis:   -8 Text Interpretation: Sinus rhythm Atrial premature complexes No significant change since last tracing Confirmed by Thayer Jew [phone]) on 02/16/2022 2:08:10 AM  Radiology DG Chest Portable 1 View  Result Date: 02/16/2022 CLINICAL DATA:  Shortness of breath for several days EXAM: PORTABLE CHEST 1 VIEW COMPARISON:  05/30/2021 FINDINGS: Cardiac shadow is within normal limits. Aortic calcifications are noted. Lungs are well aerated bilaterally. No focal infiltrate or effusion is seen. Multiple old left rib fractures are noted with deformity. No acute abnormality noted. IMPRESSION: No acute abnormality noted. Electronically Signed   By: Inez Catalina M.D.   On: 02/16/2022 02:38    Procedures Procedures    Medications Ordered in ED Medications  albuterol (PROVENTIL) (2.5 MG/3ML)  0.083% nebulizer solution (15 mg/hr Nebulization Given 02/16/22 0244)  dexamethasone (DECADRON) injection 10 mg (10 mg Intravenous Given 02/16/22 0317)    ED Course/ Medical Decision Making/ A&P Clinical Course as of 02/16/22 0458  Thu Feb 16, 2022  0458 Patient resting comfortably.  Breath sounds improved.  Patient ambulated and maintained his pulse ox greater than 92%. [CH]    Clinical Course User Index [CH] Erland Vivas, Barbette Hair, MD  Medical Decision Making Amount and/or Complexity of Data Reviewed Labs: ordered. Radiology: ordered.  Risk Prescription drug management.   This patient presents to the ED for concern of shortness of breath, this involves an extensive number of treatment options, and is a complaint that carries with it a high risk of complications and morbidity.  I considered the following differential and admission for this acute, potentially life threatening condition.  The differential diagnosis includes COPD, pneumonia, pneumothorax  MDM:    This is a 61 year old male with a history of smoking and COPD who presents with shortness of breath.  Denies infectious symptoms such as fever.  He is nontoxic and in no respiratory distress.  Resting O2 sats 92 to 94%.  He is tight with some wheezing on exam.  Highly suspect COPD.  Chest x-ray shows no evidence of pneumothorax or pneumonia.  EKG shows no evidence of acute ischemia.  Patient has not baseline chest pain from prior rib fractures but denies any new chest pain.  Doubt ACS.  Labs obtained and largely reassuring.  Patient was given a DuoNeb.  On multiple rechecks he is resting comfortably.  He was able to ambulate and maintain his pulse ox.  Will discharge with an albuterol inhaler.  He was given a dose of Decadron in the emergency department  (Labs, imaging, consults)  Labs: I Ordered, and personally interpreted labs.  The pertinent results include: CBC, BMP, BNP  Imaging Studies ordered: I  ordered imaging studies including chest x-ray I independently visualized and interpreted imaging. I agree with the radiologist interpretation  Additional history obtained from chart review.  External records from outside source obtained and reviewed including prior evaluations and admissions  Cardiac Monitoring: The patient was maintained on a cardiac monitor.  I personally viewed and interpreted the cardiac monitored which showed an underlying rhythm of: Normal sinus rhythm  Reevaluation: After the interventions noted above, I reevaluated the patient and found that they have :improved  Social Determinants of Health: Smoking  Disposition: Discharge  Co morbidities that complicate the patient evaluation  Past Medical History:  Diagnosis Date   Cervical pain 11/21/2012   COPD (chronic obstructive pulmonary disease) ([place])    DDD (degenerative disc disease)    Esophageal dysphagia    Essential hypertension    Fracture of multiple ribs 11/01/2012   Fracture of occipital condyle ([place]) 11/21/2012   Headache    Hyperlipidemia    Inguinal hernia 12/19/2013   Microcytic anemia      Medicines Meds ordered this encounter  Medications   albuterol (PROVENTIL) (2.5 MG/3ML) 0.083% nebulizer solution   dexamethasone (DECADRON) injection 10 mg   albuterol (VENTOLIN HFA) 108 (90 Base) MCG/ACT inhaler    Sig: Inhale 2 puffs into the lungs every 4 (four) hours as needed for wheezing or shortness of breath.    Dispense:  1 each    Refill:  0    I have reviewed the patients home medicines and have made adjustments as needed  Problem List / ED Course: Problem List Items Addressed This Visit   None Visit Diagnoses     COPD exacerbation ([place])    -  Primary   Relevant Medications   albuterol (PROVENTIL) (2.5 MG/3ML) 0.083% nebulizer solution (Completed)   dexamethasone (DECADRON) injection 10 mg (Completed)   albuterol (VENTOLIN HFA) 108 (90 Base) MCG/ACT inhaler                    Final Clinical Impression(s) / ED Diagnoses Final  diagnoses:  COPD exacerbation ([place])    Rx / DC Orders ED Discharge Orders          Ordered    albuterol (VENTOLIN HFA) 108 (90 Base) MCG/ACT inhaler  Every 4 hours PRN        02/16/22 0449              Lelia Jons, Barbette Hair, MD 02/16/22 0500

## 2022-02-16 NOTE — ED Triage Notes (Signed)
Pt c/o sob x 3 days.

## 2022-02-17 NOTE — Telephone Encounter (Signed)
LMOVM to call back 

## 2022-02-20 ENCOUNTER — Telehealth: Payer: Self-pay | Admitting: Internal Medicine

## 2022-02-20 NOTE — Telephone Encounter (Signed)
Called pt and is aware of pre-op appt details. Also discussed EGD instructions in detail. He voiced understanding

## 2022-02-20 NOTE — Telephone Encounter (Signed)
Pt returning call. [phone]

## 2022-02-20 NOTE — Telephone Encounter (Signed)
Pt has been scheduled.  °

## 2022-02-20 NOTE — Telephone Encounter (Signed)
Called pt. He has been scheduled for egd +/-dil with Dr. Gala Romney asap on 8/9 at [facility] will call back with pre-op appt.

## 2022-02-21 ENCOUNTER — Encounter (HOSPITAL_COMMUNITY): Payer: Self-pay

## 2022-02-21 ENCOUNTER — Encounter (HOSPITAL_COMMUNITY)
Admission: RE | Admit: 2022-02-21 | Discharge: 2022-02-21 | Disposition: A | Discharge status: Home / Self Care | Payer: Medicaid Other | Source: Ambulatory Visit | Attending: Internal Medicine | Admitting: Internal Medicine

## 2022-02-21 ENCOUNTER — Other Ambulatory Visit: Payer: Self-pay

## 2022-02-22 ENCOUNTER — Telehealth: Payer: Self-pay

## 2022-02-22 ENCOUNTER — Ambulatory Visit (HOSPITAL_COMMUNITY): Payer: Medicaid Other | Admitting: Anesthesiology

## 2022-02-22 ENCOUNTER — Encounter (HOSPITAL_COMMUNITY): Payer: Self-pay | Admitting: Internal Medicine

## 2022-02-22 ENCOUNTER — Encounter (HOSPITAL_COMMUNITY)
Admission: RE | Disposition: A | Discharge status: Home / Self Care | Payer: Self-pay | Source: Home / Self Care | Attending: Internal Medicine

## 2022-02-22 ENCOUNTER — Ambulatory Visit (HOSPITAL_COMMUNITY)
Admission: RE | Admit: 2022-02-22 | Discharge: 2022-02-22 | Disposition: A | Discharge status: Home / Self Care | Payer: Medicaid Other | Attending: Internal Medicine | Admitting: Internal Medicine

## 2022-02-22 ENCOUNTER — Ambulatory Visit (HOSPITAL_BASED_OUTPATIENT_CLINIC_OR_DEPARTMENT_OTHER): Payer: Medicaid Other | Admitting: Anesthesiology

## 2022-02-22 DIAGNOSIS — I10 Essential (primary) hypertension: Secondary | ICD-10-CM | POA: Insufficient documentation

## 2022-02-22 DIAGNOSIS — J449 Chronic obstructive pulmonary disease, unspecified: Secondary | ICD-10-CM | POA: Diagnosis not present

## 2022-02-22 DIAGNOSIS — F17210 Nicotine dependence, cigarettes, uncomplicated: Secondary | ICD-10-CM | POA: Diagnosis not present

## 2022-02-22 DIAGNOSIS — K449 Diaphragmatic hernia without obstruction or gangrene: Secondary | ICD-10-CM

## 2022-02-22 DIAGNOSIS — K222 Esophageal obstruction: Secondary | ICD-10-CM

## 2022-02-22 DIAGNOSIS — D509 Iron deficiency anemia, unspecified: Secondary | ICD-10-CM | POA: Diagnosis not present

## 2022-02-22 DIAGNOSIS — E785 Hyperlipidemia, unspecified: Secondary | ICD-10-CM | POA: Diagnosis not present

## 2022-02-22 DIAGNOSIS — D759 Disease of blood and blood-forming organs, unspecified: Secondary | ICD-10-CM | POA: Insufficient documentation

## 2022-02-22 DIAGNOSIS — K2100 Gastro-esophageal reflux disease with esophagitis, without bleeding: Secondary | ICD-10-CM

## 2022-02-22 DIAGNOSIS — R1310 Dysphagia, unspecified: Secondary | ICD-10-CM

## 2022-02-22 HISTORY — PX: MALONEY DILATION: SHX5535

## 2022-02-22 HISTORY — PX: ESOPHAGOGASTRODUODENOSCOPY (EGD) WITH PROPOFOL: SHX5813

## 2022-02-22 SURGERY — ESOPHAGOGASTRODUODENOSCOPY (EGD) WITH PROPOFOL
Anesthesia: General

## 2022-02-22 MED ORDER — PANTOPRAZOLE SODIUM 40 MG PO TBEC: 40.0000 mg | DELAYED_RELEASE_TABLET | Freq: Two times a day (BID) | ORAL | 11 refills | Status: DC

## 2022-02-22 MED ORDER — IPRATROPIUM-ALBUTEROL 0.5-2.5 (3) MG/3ML IN SOLN
RESPIRATORY_TRACT | Status: AC
  Filled 2022-02-22: qty 3

## 2022-02-22 MED ORDER — LIDOCAINE VISCOUS HCL 2 % MT SOLN
OROMUCOSAL | Status: AC
  Filled 2022-02-22: qty 15

## 2022-02-22 MED ORDER — LIDOCAINE VISCOUS HCL 2 % MT SOLN
15.0000 mL | Freq: Once | OROMUCOSAL | Status: AC
  Administered 2022-02-22: 15 mL via OROMUCOSAL

## 2022-02-22 MED ORDER — LACTATED RINGERS IV SOLN
INTRAVENOUS | Status: DC
  Administered 2022-02-22: 1000 mL via INTRAVENOUS

## 2022-02-22 MED ORDER — IPRATROPIUM-ALBUTEROL 0.5-2.5 (3) MG/3ML IN SOLN
3.0000 mL | Freq: Once | RESPIRATORY_TRACT | Status: AC
  Administered 2022-02-22: 3 mL via RESPIRATORY_TRACT

## 2022-02-22 MED ORDER — PHENYLEPHRINE HCL (PRESSORS) 10 MG/ML IV SOLN
INTRAVENOUS | Status: DC | PRN
  Administered 2022-02-22 (×2): 50 ug via INTRAVENOUS

## 2022-02-22 MED ORDER — PROPOFOL 500 MG/50ML IV EMUL
INTRAVENOUS | Status: DC | PRN
  Administered 2022-02-22: 180 ug/kg/min via INTRAVENOUS

## 2022-02-22 MED ORDER — PROPOFOL 10 MG/ML IV BOLUS
INTRAVENOUS | Status: DC | PRN
  Administered 2022-02-22: 80 mg via INTRAVENOUS

## 2022-02-22 NOTE — Anesthesia Preprocedure Evaluation (Addendum)
Anesthesia Evaluation  Patient identified by MRN, date of birth, ID band Patient awake    Reviewed: Allergy & Precautions, NPO status , Patient's Chart, lab work & pertinent test results  Airway Mallampati: II  TM Distance: >3 FB Neck ROM: Full    Dental  (+) Edentulous Upper, Edentulous Lower   Pulmonary COPD,  COPD inhaler, Current Smoker,     + wheezing      Cardiovascular hypertension, Pt. on medications  Rhythm:Irregular Rate:Normal     Neuro/Psych  Headaches, negative psych ROS   GI/Hepatic GERD  Medicated and Controlled,(+)     substance abuse  alcohol use and marijuana use,   Endo/Other  negative endocrine ROS  Renal/GU negative Renal ROS  negative genitourinary   Musculoskeletal  (+) Arthritis , Osteoarthritis,    Abdominal   Peds negative pediatric ROS (+)  Hematology  (+) Blood dyscrasia, anemia ,   Anesthesia Other Findings   Reproductive/Obstetrics negative OB ROS                            Anesthesia Physical Anesthesia Plan  ASA: 3  Anesthesia Plan: General   Post-op Pain Management: Minimal or no pain anticipated   Induction: Intravenous  PONV Risk Score and Plan: Propofol infusion  Airway Management Planned: Nasal Cannula and Natural Airway  Additional Equipment:   Intra-op Plan:   Post-operative Plan:   Informed Consent: I have reviewed the patients History and Physical, chart, labs and discussed the procedure including the risks, benefits and alternatives for the proposed anesthesia with the patient or authorized representative who has indicated his/her understanding and acceptance.     Dental advisory given  Plan Discussed with: CRNA and Surgeon  Anesthesia Plan Comments:         Anesthesia Quick Evaluation

## 2022-02-22 NOTE — Transfer of Care (Signed)
Immediate Anesthesia Transfer of Care Note  Patient: Sean Thomas  Procedure(s) Performed: ESOPHAGOGASTRODUODENOSCOPY (EGD) WITH PROPOFOL [place]  Patient Location: PACU  Anesthesia Type:General  Level of Consciousness: awake, alert  and oriented  Airway & Oxygen Therapy: Patient Spontanous Breathing and Patient connected to face mask oxygen  Post-op Assessment: Report given to RN, Post -op Vital signs reviewed and stable, Patient moving all extremities X 4 and Patient able to stick tongue midline  Post vital signs: Reviewed  Last Vitals:  Vitals Value Taken Time  BP 145/93 02/22/22 1309  Temp 36.6 C 02/22/22 1309  Pulse 72 02/22/22 1309  Resp 16 02/22/22 1309  SpO2 100 % 02/22/22 1309    Last Pain:  Vitals:   02/22/22 1309  TempSrc: Oral  PainSc: Asleep      Patients Stated Pain Goal: 10 (37/54/36 0677)  Complications: No notable events documented.

## 2022-02-22 NOTE — Op Note (Signed)
[facility] Patient Name: Sean Thomas Procedure Date: 02/22/2022 12:01 PM MRN: [id number] Date of Birth: 1961-03-28 Attending MD: Norvel Richards , MD CSN: [id number] Age: 61 Admit Type: Outpatient Procedure:                Upper GI endoscopy Indications:              Dysphagia Providers:                Norvel Richards, MD, Tammy Vaught, RN,                            Suzan Garibaldi. Risa Grill, Technician, Ladoris Gene,                            [employer]chant [employer] officer Referring MD:              Medicines:                Propofol per Anesthesia Complications:            No immediate complications. Estimated Blood Loss:     Estimated blood loss was minimal. Procedure:                Pre-Anesthesia Assessment:                           - Prior to the procedure, a History and Physical                            was performed, and patient medications and                            allergies were reviewed. The patient's tolerance of                            previous anesthesia was also reviewed. The risks                            and benefits of the procedure and the sedation                            options and risks were discussed with the patient.                            All questions were answered, and informed consent                            was obtained. Prior Anticoagulants: The patient has                            taken no previous anticoagulant or antiplatelet                            agents. ASA Grade Assessment: III - A patient with  severe systemic disease. After reviewing the risks                            and benefits, the patient was deemed in                            satisfactory condition to undergo the procedure.                           After obtaining informed consent, the endoscope was                            passed under direct vision. Throughout the                            procedure, the patient's blood pressure,  pulse, and                            oxygen saturations were monitored continuously. The                            GIF-H190 ([id number]) scope was introduced through the                            mouth, and advanced to the second part of duodenum.                            The upper GI endoscopy was accomplished without                            difficulty. The patient tolerated the procedure                            well. Scope In: 12:55:28 PM Scope Out: 1:06:29 PM Total Procedure Duration: 0 hours 11 minutes 1 second  Findings:      Esophagus slightly foreshortened due to a moderate size hiatal hernia.       Peptic stricture at the EG junction with associated erosions and       superficial ulceration. No tumor seen. No Barrett's epithelium seen       diagnostic gastroscope traversed the EG junction without any difficulty.       Stricture appeared to be noncritical. Patient had longitudinal erosion       circumferentially at the level of diaphragmatic hiatus consistent with       Lysbeth Galas lesions. The remainder the gastric mucosa appeared normal.       Patent pylorus. Normal first and second portion of the duodenum. Scope       was withdrawn. A 54 French Maloney dilators passed to full insertion       without resistance. Subsequently, a 43 [place] Maloney dilators passed       full insertion with mild resistance. A look back revealed the stricture       been dilated nicely there appeared to be a component of a Schatzki's       ring associated with it as well. ` Impression:               -  Moderate size hiatal hernia with Lysbeth Galas erosions                           -Schatzki's ring/peptic stricture with ulcer reflux                            esophagitis as described?"status post Maloney                            dilation                           -Remainder of the stomach appeared normal. Patent                            pylorus. Normal first and second portion of the                             duodenum.. Moderate Sedation:      Moderate (conscious) sedation was personally administered by an       anesthesia professional. The following parameters were monitored: oxygen       saturation, heart rate, blood pressure, respiratory rate, EKG, adequacy       of pulmonary ventilation, and response to care. Recommendation:           - Patient has a contact number available for                            emergencies. The signs and symptoms of potential                            delayed complications were discussed with the                            patient. Return to normal activities tomorrow.                            Written discharge instructions were provided to the                            patient.                           - Advance diet as tolerated. Begin Protonix 40 mg                            twice daily before meals. New prescription provided                            through the office. Office visit with [place] in 3                            months. Procedure Code(s):        --- Professional ---  48546, Esophagogastroduodenoscopy, flexible,                            transoral; diagnostic, including collection of                            specimen(s) by brushing or washing, when performed                            (separate procedure) Diagnosis Code(s):        --- Professional ---                           R13.10, Dysphagia, unspecified CPT copyright 2019 American Medical Association. All rights reserved. The codes documented in this report are preliminary and upon coder review may  be revised to meet current compliance requirements. Cristopher Estimable. Tong Pieczynski, MD Norvel Richards, MD 02/22/2022 1:17:54 PM This report has been signed electronically. Number of Addenda: 0

## 2022-02-22 NOTE — Discharge Instructions (Signed)
EGD Discharge instructions Please read the instructions outlined below and refer to this sheet in the next few weeks. These discharge instructions provide you with general information on caring for yourself after you leave the hospital. Your doctor may also give you specific instructions. While your treatment has been planned according to the most current medical practices available, unavoidable complications occasionally occur. If you have any problems or questions after discharge, please call your doctor. ACTIVITY You may resume your regular activity but move at a slower pace for the next 24 hours.  Take frequent rest periods for the next 24 hours.  Walking will help expel (get rid of) the air and reduce the bloated feeling in your abdomen.  No driving for 24 hours (because of the anesthesia (medicine) used during the test).  You may shower.  Do not sign any important legal documents or operate any machinery for 24 hours (because of the anesthesia used during the test).  NUTRITION Drink plenty of fluids.  You may resume your normal diet.  Begin with a light meal and progress to your normal diet.  Avoid alcoholic beverages for 24 hours or as instructed by your caregiver.  MEDICATIONS You may resume your normal medications unless your caregiver tells you otherwise.  WHAT YOU CAN EXPECT TODAY You may experience abdominal discomfort such as a feeling of fullness or "gas" pains.  FOLLOW-UP Your doctor will discuss the results of your test with you.  SEEK IMMEDIATE MEDICAL ATTENTION IF ANY OF THE FOLLOWING OCCUR: Excessive nausea (feeling sick to your stomach) and/or vomiting.  Severe abdominal pain and distention (swelling).  Trouble swallowing.  Temperature over 101 F (37.8 C).  Rectal bleeding or vomiting of blood.    You have acid reflux esophagitis.  You have a stricture.  I stretched your esophagus today.  You will need to take your new medication as directed.  My office staff is  calling a new prescription in for you.  Begin Protonix 40 mg tablet 30 minutes before breakfast and supper every day  Office visit with [place] Aliene Altes) in 3 months

## 2022-02-22 NOTE — Telephone Encounter (Signed)
Rx was sent in to the patient's pharmacy on file, routing to the front to schedule f/u with Kristen in 3 mths.

## 2022-02-22 NOTE — Telephone Encounter (Signed)
-----   Message from Daneil Dolin, MD sent at 02/22/2022  1:07 PM EDT ----- Patient has reflux esophagitis with stricture.  Dilated today.  Please call in a prescription Protonix 40 mg twice daily (before breakfast and supper) dispense 60 with 11 refills.  Office visit with Aliene Altes in 3 months thanks.

## 2022-02-22 NOTE — Interval H&P Note (Signed)
History and Physical Interval Note:  02/22/2022 12:47 PM  Ramer  has presented today for surgery, with the diagnosis of dysphagia.  The various methods of treatment have been discussed with the patient and family. After consideration of risks, benefits and other options for treatment, the patient has consented to  Procedure(s) with comments: ESOPHAGOGASTRODUODENOSCOPY (EGD) WITH PROPOFOL (N/A) - 1:00pm MALONEY DILATION (N/A) as a surgical intervention.  The patient's history has been reviewed, patient examined, no change in status, stable for surgery.  I have reviewed the patient's chart and labs.  Questions were answered to the patient's satisfaction.     Kameelah Minish  Wheezing this morning.  Nebulizer has helped.  No change from findings at office visit recently.  Patient needs EGD with dilation as feasible/appropriate per plan.   The risks, benefits, limitations, alternatives and imponderables have been reviewed with the patient. Potential for esophageal dilation, biopsy, etc. have also been reviewed.  Questions have been answered. All parties agreeable.    Further recommendations to follow.

## 2022-02-22 NOTE — Anesthesia Postprocedure Evaluation (Signed)
Anesthesia Post Note  Patient: Sean Thomas  Procedure(s) Performed: ESOPHAGOGASTRODUODENOSCOPY (EGD) WITH PROPOFOL [place]  Patient location during evaluation: Phase II Anesthesia Type: General Level of consciousness: awake and alert and oriented Pain management: pain level controlled Vital Signs Assessment: post-procedure vital signs reviewed and stable Respiratory status: spontaneous breathing, nonlabored ventilation and respiratory function stable Cardiovascular status: blood pressure returned to baseline and stable Postop Assessment: no apparent nausea or vomiting Anesthetic complications: no   No notable events documented.   Last Vitals:  Vitals:   02/22/22 1118 02/22/22 1309  BP: (!) 144/91 (!) 145/93  Pulse: 85 72  Resp: 19 16  Temp: 36.5 C 36.6 C  SpO2: 97% 100%    Last Pain:  Vitals:   02/22/22 1309  TempSrc: Oral  PainSc: Asleep                 Sean Thomas

## 2022-02-24 ENCOUNTER — Telehealth: Payer: Self-pay | Admitting: Adult Health

## 2022-02-24 NOTE — Telephone Encounter (Signed)
OV made, appt card mailed ?

## 2022-02-24 NOTE — Telephone Encounter (Signed)
Pt has not been seen in 2 years. Pt will need an appt prior to being able to get meds refilled. Called and spoke with pt letting him know this and he verbalized understanding. I did move pt's appt up sooner to 8/16. Nothing further needed.

## 2022-02-27 ENCOUNTER — Emergency Department (HOSPITAL_COMMUNITY)
Admission: EM | Admit: 2022-02-27 | Discharge: 2022-02-28 | Disposition: A | Discharge status: Home / Self Care | Payer: Medicaid Other | Attending: Emergency Medicine | Admitting: Emergency Medicine

## 2022-02-27 ENCOUNTER — Encounter (HOSPITAL_COMMUNITY): Payer: Self-pay | Admitting: Internal Medicine

## 2022-02-27 ENCOUNTER — Emergency Department (HOSPITAL_COMMUNITY): Payer: Medicaid Other

## 2022-02-27 ENCOUNTER — Other Ambulatory Visit: Payer: Self-pay

## 2022-02-27 DIAGNOSIS — J441 Chronic obstructive pulmonary disease with (acute) exacerbation: Secondary | ICD-10-CM | POA: Diagnosis not present

## 2022-02-27 DIAGNOSIS — I10 Essential (primary) hypertension: Secondary | ICD-10-CM | POA: Insufficient documentation

## 2022-02-27 DIAGNOSIS — Z7951 Long term (current) use of inhaled steroids: Secondary | ICD-10-CM | POA: Insufficient documentation

## 2022-02-27 DIAGNOSIS — R0602 Shortness of breath: Secondary | ICD-10-CM | POA: Diagnosis present

## 2022-02-27 LAB — BASIC METABOLIC PANEL
Anion gap: 9 (ref 5–15)
BUN: 10 mg/dL (ref 8–23)
CO2: 29 mmol/L (ref 22–32)
Calcium: 8.8 mg/dL — ABNORMAL LOW (ref 8.9–10.3)
Chloride: 101 mmol/L (ref 98–111)
Creatinine, Ser: 0.74 mg/dL (ref 0.61–1.24)
GFR, Estimated: >60 mL/min (ref >60)
Glucose, Bld: 93 mg/dL (ref 70–99)
Potassium: 3.8 mmol/L (ref 3.5–5.1)
Sodium: 139 mmol/L (ref 135–145)

## 2022-02-27 LAB — CBC
HCT: 41.7 % (ref 39.0–52.0)
Hemoglobin: 12.8 g/dL — ABNORMAL LOW (ref 13.0–17.0)
MCH: 27 pg (ref 26.0–34.0)
MCHC: 30.7 g/dL (ref 30.0–36.0)
MCV: 88 fL (ref 80.0–100.0)
Platelets: 289 10*3/uL (ref 150–400)
RBC: 4.74 MIL/uL (ref 4.22–5.81)
RDW: 15.1 % (ref 11.5–15.5)
WBC: 16.3 10*3/uL — ABNORMAL HIGH (ref 4.0–10.5)
nRBC: 0 % (ref 0.0–0.2)

## 2022-02-27 MED ORDER — ALBUTEROL SULFATE (2.5 MG/3ML) 0.083% IN NEBU
10.0000 mg/h | INHALATION_SOLUTION | Freq: Once | RESPIRATORY_TRACT | Status: DC
  Filled 2022-02-27: qty 3

## 2022-02-27 MED ORDER — AZITHROMYCIN 250 MG PO TABS: ORAL_TABLET | ORAL | 0 refills | Status: DC

## 2022-02-27 MED ORDER — PREDNISONE 20 MG PO TABS: 40.0000 mg | ORAL_TABLET | Freq: Every day | ORAL | 0 refills | Status: DC

## 2022-02-27 MED ORDER — IPRATROPIUM-ALBUTEROL 0.5-2.5 (3) MG/3ML IN SOLN
3.0000 mL | Freq: Once | RESPIRATORY_TRACT | Status: AC
  Administered 2022-02-27: 3 mL via RESPIRATORY_TRACT
  Filled 2022-02-27: qty 3

## 2022-02-27 MED ORDER — METHYLPREDNISOLONE SODIUM SUCC 125 MG IJ SOLR
125.0000 mg | Freq: Once | INTRAMUSCULAR | Status: AC
  Administered 2022-02-27: 125 mg via INTRAVENOUS
  Filled 2022-02-27: qty 2

## 2022-02-27 NOTE — ED Triage Notes (Signed)
Pt complaining of shortness of breath that started Friday, has been out of his albuterol treatments. Said the pharmacy does not have them. Needs a breathing treatment

## 2022-02-27 NOTE — ED Provider Notes (Signed)
[facility] EMERGENCY DEPARTMENT Provider Note   CSN: [id number] Arrival date & time: 02/27/22  6578     History  Chief Complaint  Patient presents with   Shortness of [place] is a 61 y.o. male with medical history of COPD, esophageal dysphagia, hypertension.  The patient presents to the ED for evaluation of shortness of breath.  Patient states that since Friday he has had persistent shortness of breath.  The patient states that he was recently seen in this ED on 8/3 for COPD exacerbation, provided with albuterol inhaler and discharge.  Patient states that he is in need of breathing treatment, states that he was supposed to get this filled on Friday but his PCP did not send the prescription into the pharmacy.  Patient currently complaining of shortness of breath however denies any chest pain, nausea vomiting, fevers, body aches or chills, cough.   Shortness of Breath Associated symptoms: no chest pain, no cough, no fever and no vomiting        Home Medications Prior to Admission medications   Medication Sig Start Date End Date Taking? Authorizing Provider  azithromycin (ZITHROMAX Z-PAK) 250 MG tablet Take 2 tablets (500 mg total) by mouth daily for 1 day, THEN 1 tablet (250 mg total) daily for 4 days. 02/28/22 03/05/22 Yes Azucena Cecil, PA-C  predniSONE (DELTASONE) 20 MG tablet Take 2 tablets (40 mg total) by mouth daily with breakfast. 02/27/22  Yes Azucena Cecil, PA-C  albuterol (PROAIR HFA) 108 (90 Base) MCG/ACT inhaler Inhale 2 puffs into the lungs every 4 (four) hours as needed for wheezing or shortness of breath. 11/01/20   Kinnie Feil, PA-C  albuterol (PROVENTIL) (2.5 MG/3ML) 0.083% nebulizer solution Take 3 mLs (2.5 mg total) by nebulization every 6 (six) hours as needed for wheezing or shortness of breath. 10/04/16   Nat Christen, MD  albuterol (VENTOLIN HFA) 108 (90 Base) MCG/ACT inhaler Inhale 2 puffs into the lungs every 4 (four) hours as  needed for wheezing or shortness of breath. 02/16/22   Horton, Barbette Hair, MD  ferrous sulfate 325 (65 FE) MG tablet Take 325 mg by mouth daily with breakfast.    [provider]  Fluticasone-Umeclidin-Vilant (TRELEGY ELLIPTA) 100-62.5-25 MCG/INH AEPB Inhale 1 puff into the lungs daily. 11/01/20   Kinnie Feil, PA-C  gabapentin (NEURONTIN) 600 MG tablet Take 600 mg by mouth 3 (three) times daily. 03/14/21   [provider]  pantoprazole (PROTONIX) 40 MG tablet Take 1 tablet (40 mg total) by mouth 2 (two) times daily before a meal. 02/22/22   Rourk, Cristopher Estimable, MD      Allergies    Advil [ibuprofen] and Aleve [naproxen sodium]    Review of Systems   Review of Systems  Constitutional:  Negative for chills and fever.  Respiratory:  Positive for shortness of breath. Negative for cough.   Cardiovascular:  Negative for chest pain.  Gastrointestinal:  Negative for nausea and vomiting.  All other systems reviewed and are negative.   Physical Exam Updated Vital Signs BP (!) 162/94 (BP Location: Right Arm)   Pulse 93   Temp 98.2 F (36.8 C) (Oral)   Resp 18   Ht '6\' 3"'$  (1.905 m)   Wt 79.4 kg   SpO2 95%   BMI 21.87 kg/m  Physical Exam Vitals and nursing note reviewed.  Constitutional:      General: He is not in acute distress.    Appearance: Normal appearance.  He is not ill-appearing, toxic-appearing or diaphoretic.  HENT:     Head: Normocephalic and atraumatic.     Nose: Nose normal. No congestion.     Mouth/Throat:     Mouth: Mucous membranes are moist.     Pharynx: Oropharynx is clear.  Eyes:     Extraocular Movements: Extraocular movements intact.     Conjunctiva/sclera: Conjunctivae normal.     Pupils: Pupils are equal, round, and reactive to light.  Cardiovascular:     Rate and Rhythm: Normal rate and regular rhythm.  Pulmonary:     Effort: Pulmonary effort is normal.     Breath sounds: Wheezing present.  Abdominal:     General: Abdomen is flat. Bowel  sounds are normal.     Palpations: Abdomen is soft.     Tenderness: There is no abdominal tenderness.  Musculoskeletal:     Cervical back: Normal range of motion and neck supple.  Skin:    General: Skin is warm and dry.     Capillary Refill: Capillary refill takes less than 2 seconds.  Neurological:     General: No focal deficit present.     Mental Status: He is alert and oriented to person, place, and time.     GCS: GCS eye subscore is 4. GCS verbal subscore is 5. GCS motor subscore is 6.     Cranial Nerves: Cranial nerves 2-12 are intact. No cranial nerve deficit.     Sensory: Sensation is intact. No sensory deficit.     Motor: Motor function is intact. No weakness.     Coordination: Coordination is intact. Heel to [facility] Test normal.     ED Results / Procedures / Treatments   Labs (all labs ordered are listed, but only abnormal results are displayed) Labs Reviewed  CBC - Abnormal; Notable for the following components:      Result Value   WBC 16.3 (*)    Hemoglobin 12.8 (*)    All other components within normal limits  BASIC METABOLIC PANEL - Abnormal; Notable for the following components:   Calcium 8.8 (*)    All other components within normal limits    EKG None  Radiology DG Chest 2 View  Result Date: 02/27/2022 CLINICAL DATA:  Shortness of breath for several days EXAM: CHEST - 2 VIEW COMPARISON:  02/16/2022 FINDINGS: Cardiac shadow is stable. Hiatal hernia is noted. Lungs are well aerated bilaterally. Multiple old left rib fractures are seen and stable. No focal infiltrate or effusion is noted. Degenerative changes of the thoracic spine are seen. IMPRESSION: No active cardiopulmonary disease. Electronically Signed   By: Inez Catalina M.D.   On: 02/27/2022 22:36    Procedures Procedures   Medications Ordered in ED Medications  albuterol (PROVENTIL) (2.5 MG/3ML) 0.083% nebulizer solution (has no administration in time range)  ipratropium-albuterol (DUONEB) 0.5-2.5 (3)  MG/3ML nebulizer solution 3 mL (3 mLs Nebulization Given 02/27/22 2227)  methylPREDNISolone sodium succinate (SOLU-MEDROL) 125 mg/2 mL injection 125 mg (125 mg Intravenous Given 02/27/22 2233)    ED Course/ Medical Decision Making/ A&P                           Medical Decision Making Amount and/or Complexity of Data Reviewed Labs: ordered. Radiology: ordered.  Risk Prescription drug management.   61 year old male presents to the ED for evaluation of shortness of breath.  Please see HPI for further details.  On examination, the patient is afebrile and nontachycardic.  The patient lung sounds have scattered wheezing throughout, the patient is speaking in full sentences, the patient is not hypoxic.  The patient abdomen is soft and compressible all 4 quadrants.  The patient neurological examination shows no focal neurodeficits.  Patient will be worked up utilizing the following labs and imaging studies interpreted by me personally: - BMP unremarkable - CBC with elevated white blood cell count of 16.3 however patient afebrile, not tachycardic - Plain film imaging of chest shows no consolidation, effusion - EKG nonischemic  Patient treated with 125 Solu-Medrol, DuoNeb.  Patient reexamined, still found to have scattered wheezing throughout.  Continuous DuoNeb has been ordered at this time.  At the end of my shift, the patient treatment was not yet complete.  The patient will be signed out to Dr. Dina Rich for further management.  Plan of management has been discussed with Dr. Dina Rich.  Patient stable at time of handoff.  Final Clinical Impression(s) / ED Diagnoses Final diagnoses:  COPD exacerbation ([place])    Rx / DC Orders ED Discharge Orders          Ordered    azithromycin (ZITHROMAX Z-PAK) 250 MG tablet  Daily        02/27/22 2323    predniSONE (DELTASONE) 20 MG tablet  Daily with breakfast        02/27/22 2323              Azucena Cecil, PA-C 02/27/22 2324     Godfrey Pick, MD 03/02/22 0131

## 2022-02-27 NOTE — Discharge Instructions (Addendum)
Please return to the ED with any new or worsening signs or symptoms Please follow-up with your PCP Please pick up prescription medications I have sent in.  You will take 40 mg of prednisone 1 time daily for the next 4 days beginning on 8/15.  He will also take a Z-Pak.  You will take 500 mg beginning tomorrow and then 250 mg for the following 4 days. Please read attached guide concerning COPD exacerbations

## 2022-02-27 NOTE — ED Notes (Signed)
Respiratory notified of order for nebulizer treatment

## 2022-02-28 NOTE — ED Notes (Signed)
Pt upset about waiting for breathing treatment. Sean Thomas he was going home

## 2022-03-01 ENCOUNTER — Ambulatory Visit (INDEPENDENT_AMBULATORY_CARE_PROVIDER_SITE_OTHER): Payer: Medicaid Other | Admitting: Primary Care

## 2022-03-01 ENCOUNTER — Encounter: Payer: Self-pay | Admitting: Primary Care

## 2022-03-01 DIAGNOSIS — J9611 Chronic respiratory failure with hypoxia: Secondary | ICD-10-CM

## 2022-03-01 DIAGNOSIS — J438 Other emphysema: Secondary | ICD-10-CM | POA: Diagnosis not present

## 2022-03-01 MED ORDER — ALBUTEROL SULFATE HFA 108 (90 BASE) MCG/ACT IN AERS: 2.0000 | INHALATION_SPRAY | RESPIRATORY_TRACT | 1 refills | Status: DC | PRN

## 2022-03-01 MED ORDER — PREDNISONE 10 MG PO TABS: 10.0000 mg | ORAL_TABLET | Freq: Every day | ORAL | 0 refills | Status: DC

## 2022-03-01 MED ORDER — AZITHROMYCIN 250 MG PO TABS: ORAL_TABLET | ORAL | 0 refills | Status: AC

## 2022-03-01 MED ORDER — TRELEGY ELLIPTA 100-62.5-25 MCG/ACT IN AEPB: 28.0000 | INHALATION_SPRAY | Freq: Every day | RESPIRATORY_TRACT | 0 refills | Status: DC

## 2022-03-01 MED ORDER — PREDNISONE 20 MG PO TABS: 40.0000 mg | ORAL_TABLET | Freq: Every day | ORAL | 0 refills | Status: DC

## 2022-03-01 NOTE — Patient Instructions (Addendum)
Recommendations: - Continue Trelegy 16mg one puff daily  - Pick up zpack and prednisone '40mg'$  daily x 4 days (then stay on '10mg'$  daily) - Strongly encourage you quit smoking altogether   Orders: - PFTs - Ambulatory walk   Follow-up: - First available with Dr. BLamonte Sakaiin 4-8 weeks (with 1 hour PFT prior)

## 2022-03-01 NOTE — Assessment & Plan Note (Signed)
-   Resolved; No longer requiring supplemental oxygen. Ambulated today in office and lowest O2 saturation was 90% RA after 3 laps.

## 2022-03-01 NOTE — Assessment & Plan Note (Addendum)
-   Poorly controlled, medical compliance is an ongoing issue d/t transportation issues. He has not been seen in 2 years. He was unable to get Trelegy prescription filled this month. Seen in ED on 02/27/22 and left after 2 hours. Sending in RX zpack and prednisone '40mg'$  x 4 days and then instructed he stay on '10mg'$  daily. We will give him 1 month supply of Trelegy 150mg and refill prescription. Strongly encourage patient quit smoking altogether. FU IN 4-8 Weeks with Dr. BLamonte Sakaiand PFTs.

## 2022-03-01 NOTE — Progress Notes (Signed)
$'@Patient'c$  ID: Sean Thomas, male    DOB: 05-15-1961, 61 y.o.   MRN: [id number]  Chief Complaint  Patient presents with   Follow-up    Pt states he has no meds at all x3 weeks    Referring provider: No ref. provider found  HPI: 61 year old male, current everyday smoker.  Medical history significant for COPD, chronic respiratory failure with hypoxia, right pneumothorax, hypertension, dysphagia, iron deficiency anemia, chronic back pain, hyperlipidemia.  Patient of Dr. Lamonte Sakai.  03/01/2022 Patient presents today for overdue follow-up. He was last seen in May 2021. He was seen in ED on 02/27/22 for evaluation of shortness of breath. CXR showed no acute process. Cbc showed elevated white blood cell count of 16.3, however, patient was not febrile or tachycardia. Treated with solumedrol, duoneb. Discharged on RX zpack and prednisone course.   His breathing is some better since he found a box of nebulizer's at home. He has been using ipratropium-albuterol every 2-3 hours. He did not get Zpack/prednisone prescription when discharged from ED.  He was using Trelegy up until this month but could not get prescription refilled. He was also previously on daily prednisone and states that he did well while taking that and is needing refill. He has a productive cough with purulent mucus. O2 levels have not dropped below 94% at home. He is no longer on oxygen. He is still smoking 2 cigarettes a day. Transportation is an ongoing issue for him.    TEST/EVENTS :  Spirometry June 2015 FEV1 41%, ratio 58, FVC 56%  03/2019  FEV1 64%, ratio 69, FVC 70%, DLCO 56%.  No significant bronchodilator response. Postbronchodilator FEV1 70%, ratio 70, FVC 75%, mid flow reversibility.  Allergies  Allergen Reactions   Advil [Ibuprofen] Diarrhea   Aleve [Naproxen Sodium] Nausea And Vomiting    diarrhea    Immunization History  Administered Date(s) Administered   Influenza Split 03/28/2016, 04/16/2018   Influenza,inj,Quad  PF,6+ Mos 03/21/2019   Influenza-Unspecified 05/18/2014   Pneumococcal Conjugate-13 02/28/2018   Pneumococcal Polysaccharide-23 02/29/2016   Tdap 10/28/2012    Past Medical History:  Diagnosis Date   Cervical pain 11/21/2012   COPD (chronic obstructive pulmonary disease) (HCC)    DDD (degenerative disc disease)    Esophageal dysphagia    Essential hypertension    Fracture of multiple ribs 11/01/2012   Fracture of occipital condyle ([place]) 11/21/2012   Headache    Hyperlipidemia    Inguinal hernia 12/19/2013   Microcytic anemia     Tobacco History: Social History   Tobacco Use  Smoking Status Every Day   Packs/day: 0.25   Years: 45.00   Total pack years: 11.25   Types: Cigarettes  Smokeless Tobacco Never   Ready to quit: Not Answered Counseling given: Not Answered   Outpatient Medications Prior to Visit  Medication Sig Dispense Refill   albuterol (VENTOLIN HFA) 108 (90 Base) MCG/ACT inhaler Inhale 2 puffs into the lungs every 4 (four) hours as needed for wheezing or shortness of breath. (Patient not taking: Reported on 03/01/2022) 1 each 0   Fluticasone-Umeclidin-Vilant (TRELEGY ELLIPTA) 100-62.5-25 MCG/INH AEPB Inhale 1 puff into the lungs daily. (Patient not taking: Reported on 03/01/2022) 28 each 0   gabapentin (NEURONTIN) 600 MG tablet Take 600 mg by mouth 3 (three) times daily. (Patient not taking: Reported on 03/01/2022)     pantoprazole (PROTONIX) 40 MG tablet Take 1 tablet (40 mg total) by mouth 2 (two) times daily before a meal. (Patient not taking: Reported on  03/01/2022) 60 tablet 11   albuterol (PROAIR HFA) 108 (90 Base) MCG/ACT inhaler Inhale 2 puffs into the lungs every 4 (four) hours as needed for wheezing or shortness of breath. (Patient not taking: Reported on 03/01/2022) 18 g 0   albuterol (PROVENTIL) (2.5 MG/3ML) 0.083% nebulizer solution Take 3 mLs (2.5 mg total) by nebulization every 6 (six) hours as needed for wheezing or shortness of breath. (Patient not taking:  Reported on 03/01/2022) 75 mL 3   azithromycin (ZITHROMAX Z-PAK) 250 MG tablet Take 2 tablets (500 mg total) by mouth daily for 1 day, THEN 1 tablet (250 mg total) daily for 4 days. 6 tablet 0   ferrous sulfate 325 (65 FE) MG tablet Take 325 mg by mouth daily with breakfast.     predniSONE (DELTASONE) 20 MG tablet Take 2 tablets (40 mg total) by mouth daily with breakfast. (Patient not taking: Reported on 03/01/2022) 8 tablet 0   No facility-administered medications prior to visit.   Review of Systems  Review of Systems  Constitutional: Negative.   HENT:  Positive for congestion.   Respiratory:  Positive for cough and wheezing.   Cardiovascular: Negative.    Physical Exam  BP 122/80 (BP Location: Left Arm, Patient Position: Sitting, Cuff Size: Normal)   Pulse 91   Temp 98.2 F (36.8 C) (Oral)   Ht '6\' 2"'$  (1.88 m)   Wt 175 lb 9.6 oz (79.7 kg)   SpO2 94%   BMI 22.55 kg/m  Physical Exam Constitutional:      Appearance: Normal appearance.  HENT:     Head: Normocephalic and atraumatic.     Mouth/Throat:     Mouth: Mucous membranes are moist.     Pharynx: Oropharynx is clear.  Cardiovascular:     Rate and Rhythm: Normal rate and regular rhythm.  Pulmonary:     Effort: Pulmonary effort is normal.     Breath sounds: No wheezing, rhonchi or rales.  Musculoskeletal:        General: Normal range of motion.  Skin:    General: Skin is warm and dry.  Neurological:     General: No focal deficit present.     Mental Status: He is alert and oriented to person, place, and time. Mental status is at baseline.  Psychiatric:        Mood and Affect: Mood normal.        Behavior: Behavior normal.        Thought Content: Thought content normal.        Judgment: Judgment normal.      Lab Results:  CBC    Component Value Date/Time   WBC 16.3 (H) 02/27/2022 2145   RBC 4.74 02/27/2022 2145   HGB 12.8 (L) 02/27/2022 2145   HCT 41.7 02/27/2022 2145   PLT 289 02/27/2022 2145   MCV 88.0  02/27/2022 2145   MCH 27.0 02/27/2022 2145   MCHC 30.7 02/27/2022 2145   RDW 15.1 02/27/2022 2145   LYMPHSABS 4.4 (H) 02/16/2022 0235   MONOABS 0.8 02/16/2022 0235   EOSABS 1.0 (H) 02/16/2022 0235   BASOSABS 0.1 02/16/2022 0235    BMET    Component Value Date/Time   NA 139 02/27/2022 2145   K 3.8 02/27/2022 2145   CL 101 02/27/2022 2145   CO2 29 02/27/2022 2145   GLUCOSE 93 02/27/2022 2145   BUN 10 02/27/2022 2145   CREATININE 0.74 02/27/2022 2145   CALCIUM 8.8 (L) 02/27/2022 2145   GFRNONAA >60 02/27/2022 2145  GFRAA >60 11/02/2016 0559    BNP    Component Value Date/Time   BNP 28.0 02/16/2022 0235    ProBNP No results found for: "PROBNP"  Imaging: DG Chest 2 View  Result Date: 02/27/2022 CLINICAL DATA:  Shortness of breath for several days EXAM: CHEST - 2 VIEW COMPARISON:  02/16/2022 FINDINGS: Cardiac shadow is stable. Hiatal hernia is noted. Lungs are well aerated bilaterally. Multiple old left rib fractures are seen and stable. No focal infiltrate or effusion is noted. Degenerative changes of the thoracic spine are seen. IMPRESSION: No active cardiopulmonary disease. Electronically Signed   By: Inez Catalina M.D.   On: 02/27/2022 22:36   DG Chest Portable 1 View  Result Date: 02/16/2022 CLINICAL DATA:  Shortness of breath for several days EXAM: PORTABLE CHEST 1 VIEW COMPARISON:  05/30/2021 FINDINGS: Cardiac shadow is within normal limits. Aortic calcifications are noted. Lungs are well aerated bilaterally. No focal infiltrate or effusion is seen. Multiple old left rib fractures are noted with deformity. No acute abnormality noted. IMPRESSION: No acute abnormality noted. Electronically Signed   By: Inez Catalina M.D.   On: 02/16/2022 02:38     Assessment & Plan:   COPD (chronic obstructive pulmonary disease) - Poorly controlled, medical compliance is an ongoing issue d/t transportation issues. He has not been seen in 2 years. He was unable to get Trelegy prescription  filled this month. Seen in ED on 02/27/22 and left after 2 hours. Sending in RX zpack and prednisone '40mg'$  x 4 days and then instructed he stay on '10mg'$  daily. We will give him 1 month supply of Trelegy 144mg and refill prescription. Strongly encourage patient quit smoking altogether. FU IN 4-8 Weeks with Dr. BLamonte Sakaiand PFTs.   Chronic respiratory failure with hypoxia (HCC) - Resolved; No longer requiring supplemental oxygen. Ambulated today in office and lowest O2 saturation was 90% RA after 3 laps.    EMartyn Ehrich NP 03/01/2022

## 2022-03-02 ENCOUNTER — Telehealth: Payer: Self-pay | Admitting: Primary Care

## 2022-03-02 MED ORDER — IPRATROPIUM-ALBUTEROL 0.5-2.5 (3) MG/3ML IN SOLN: 3.0000 mL | Freq: Four times a day (QID) | RESPIRATORY_TRACT | 2 refills | Status: DC | PRN

## 2022-03-02 NOTE — Telephone Encounter (Signed)
Called patient back this morning and he was needing refills on his Duoneb medication for his nebulizer. Confirmed pharmacy with patient. Refills sent. Nothing further needed.

## 2022-03-07 ENCOUNTER — Ambulatory Visit: Payer: Medicaid Other | Admitting: Primary Care

## 2022-04-26 ENCOUNTER — Other Ambulatory Visit: Payer: Self-pay | Admitting: *Deleted

## 2022-04-26 DIAGNOSIS — J438 Other emphysema: Secondary | ICD-10-CM

## 2022-04-27 ENCOUNTER — Ambulatory Visit: Payer: Medicaid Other | Admitting: Emergency Medicine

## 2022-05-08 NOTE — Progress Notes (Deleted)
Referring Provider: No ref. provider found Primary Care Physician:  Pcp, No Primary GI Physician: Dr. Gala Romney  No chief complaint on file.   HPI:   Sean Thomas is a 61 y.o. male with history of COPD, HTN, HLD, IDA, reflux esophagitis, dysphagia, presenting today for follow-up of dysaphia.  Last seen in out office 02/16/22. He reported worsening dysphaiga over the last couple of months, only tolerating soft foods and liquids. Associated 10 lb weight loss. Denied heartburn, not on a PPI. No overt GI bleeding.  Due to history of IDA, recommended updating iron panel and planned to proceed EGD. Needed updated colonoscopy as previously recommended due to prior poor prep, but patient requested to hold off.  EGD 02/22/22:  - Moderate size hiatal hernia with Lysbeth Galas erosions -Schatzki's ring/peptic stricture with ulcer reflux esophagitis s/p dilation - Recommended PPI BID    Today:       Past Medical History:  Diagnosis Date   Cervical pain 11/21/2012   COPD (chronic obstructive pulmonary disease) (HCC)    DDD (degenerative disc disease)    Esophageal dysphagia    Essential hypertension    Fracture of multiple ribs 11/01/2012   Fracture of occipital condyle ([place]) 11/21/2012   Headache    Hyperlipidemia    Inguinal hernia 12/19/2013   Microcytic anemia     Past Surgical History:  Procedure Laterality Date   BACK SURGERY     COLONOSCOPY WITH PROPOFOL N/A 06/15/2016   RMR: inadequate bowel prep all mucosal surfaces not well seen. 5 mm tubular adenoma removed from the hepatic flexure. 3 mm polyp from the rectum was hyperplastic. He had scattered diverticula   ESOPHAGEAL BRUSHING  12/29/2019   Procedure: ESOPHAGEAL BRUSHING;  Surgeon: Daneil Dolin, MD;  [facility];  Service: Endoscopy;;   ESOPHAGOGASTRODUODENOSCOPY (EGD) WITH PROPOFOL N/A 06/15/2016   Dr. Gala Romney: Large hiatal hernia, reflux esophagitis, Schatzki ring with focal area of ulceration, status post dilation  with 9F Maloney dilator with moderate improvement in luminal narrowing.   ESOPHAGOGASTRODUODENOSCOPY (EGD) WITH PROPOFOL N/A 12/29/2019   Surgeon: Daneil Dolin, MD; Candida esophagitis, Schatzki ring status post dilation, medium sized hiatal hernia with a single Cameron lesion.  KOH positive, treated with Diflucan for 21 days.   ESOPHAGOGASTRODUODENOSCOPY (EGD) WITH PROPOFOL N/A 02/22/2022   Procedure: ESOPHAGOGASTRODUODENOSCOPY (EGD) WITH PROPOFOL;  Surgeon: Daneil Dolin, MD;  [facility]: [facility];  Service: Endoscopy;  Laterality: N/A;  1:00pm   INGUINAL HERNIA REPAIR Left 07/24/2014   Procedure: LAPAROSCOPIC LEFT INGUINAL HERNIA REPAIR ;  Surgeon: Ralene Ok, MD;  Location: [place];  Service: General;  Laterality: Left;   INGUINAL HERNIA REPAIR Right 08/17/2021   Procedure: HERNIA REPAIR INGUINAL ADULT W/ MESH;  Surgeon: Aviva Signs, MD;  Location: [facility];  Service: General;  Laterality: Right;   INSERTION OF MESH Left 07/24/2014   Procedure: INSERTION OF MESH;  Surgeon: Ralene Ok, MD;  Location: [place];  Service: General;  Laterality: Left;   [place] N/A 06/15/2016   Procedure: Venia Minks DILATION;  Surgeon: Daneil Dolin, MD;  [facility];  Service: Endoscopy;  Laterality: N/A;   MALONEY DILATION N/A 12/29/2019   Procedure: Venia Minks DILATION;  Surgeon: Daneil Dolin, MD;  [facility];  Service: Endoscopy;  Laterality: N/A;   MALONEY DILATION N/A 02/22/2022   Procedure: Venia Minks DILATION;  Surgeon: Daneil Dolin, MD;  [facility];  Service: Endoscopy;  Laterality: N/A;   POLYPECTOMY  06/15/2016   Procedure: POLYPECTOMY;  Surgeon: Daneil Dolin, MD;  [facility];  Service: Endoscopy;;  colon   right lung surgery Right     Current Outpatient Medications  Medication Sig Dispense Refill   albuterol (PROAIR HFA) 108 (90 Base) MCG/ACT inhaler Inhale 2 puffs into the lungs every 4 (four) hours as needed  for wheezing or shortness of breath. 18 g 1   albuterol (VENTOLIN HFA) 108 (90 Base) MCG/ACT inhaler Inhale 2 puffs into the lungs every 4 (four) hours as needed for wheezing or shortness of breath. (Patient not taking: Reported on 03/01/2022) 1 each 0   Fluticasone-Umeclidin-Vilant (TRELEGY ELLIPTA) 100-62.5-25 MCG/ACT AEPB Inhale 28 each into the lungs daily. 2 each 0   Fluticasone-Umeclidin-Vilant (TRELEGY ELLIPTA) 100-62.5-25 MCG/INH AEPB Inhale 1 puff into the lungs daily. (Patient not taking: Reported on 03/01/2022) 28 each 0   gabapentin (NEURONTIN) 600 MG tablet Take 600 mg by mouth 3 (three) times daily. (Patient not taking: Reported on 03/01/2022)     ipratropium-albuterol (DUONEB) 0.5-2.5 (3) MG/3ML SOLN Take 3 mLs by nebulization every 6 (six) hours as needed. 360 mL 2   pantoprazole (PROTONIX) 40 MG tablet Take 1 tablet (40 mg total) by mouth 2 (two) times daily before a meal. (Patient not taking: Reported on 03/01/2022) 60 tablet 11   predniSONE (DELTASONE) 10 MG tablet Take 1 tablet (10 mg total) by mouth daily with breakfast. Start after prednisone taper 30 tablet 0   predniSONE (DELTASONE) 20 MG tablet Take 2 tablets (40 mg total) by mouth daily with breakfast. 8 tablet 0   No current facility-administered medications for this visit.    Allergies as of 05/10/2022 - Review Complete 03/01/2022  Allergen Reaction Noted   Advil [ibuprofen] Diarrhea 07/20/2014   Aleve [naproxen sodium] Nausea And Vomiting 04/23/2011    Family History  Problem Relation Age of Onset   Diabetes Mother    Alzheimer's disease Mother    Diabetes Sister    Colon cancer Neg Hx    Liver disease Neg Hx     Social History   Socioeconomic History   Marital status: Single    Spouse name: Not on file   Number of children: Not on file   Years of education: Not on file   Highest education level: Not on file  Occupational History   Not on file  Tobacco Use   Smoking status: Every Day    Packs/day: 0.25     Years: 45.00    Total pack years: 11.25    Types: Cigarettes   Smokeless tobacco: Never  Vaping Use   Vaping Use: Never used  Substance and Sexual Activity   Alcohol use: Yes    Alcohol/week: 9.0 standard drinks of alcohol    Types: 9 Cans of beer per week    Comment: occ beer   Drug use: Yes    Types: Marijuana    Comment: last marijuana was 1 month ago   Sexual activity: Not on file  Other Topics Concern   Not on file  Social History Narrative   Not on file   Social Determinants of Health   Financial Resource Strain: Not on file  Food Insecurity: Not on file  Transportation Needs: Not on file  Physical Activity: Not on file  Stress: Not on file  Social Connections: Not on file    Review of Systems: Gen: Denies fever, chills, anorexia. Denies fatigue, weakness, weight loss.  CV:  Denies chest pain, palpitations, syncope, peripheral edema, and claudication. Resp: Denies dyspnea at rest, cough, wheezing, coughing up blood, and pleurisy. GI: Denies vomiting blood, jaundice, and fecal incontinence.   Denies dysphagia or odynophagia. Derm: Denies rash, itching, dry skin Psych: Denies depression, anxiety, memory loss, confusion. No homicidal or suicidal ideation.  Heme: Denies bruising, bleeding, and enlarged lymph nodes.  Physical Exam: There were no vitals taken for this visit. General:   Alert and oriented. No distress noted. Pleasant and cooperative.  Head:  Normocephalic and atraumatic. Eyes:  Conjuctiva clear without scleral icterus. Heart:  S1, S2 present without murmurs appreciated. Lungs:  Clear to auscultation bilaterally. No wheezes, rales, or rhonchi. No distress.  Abdomen:  +BS, soft, non-tender and non-distended. No rebound or guarding. No HSM or masses noted. Msk:  Symmetrical without gross deformities. Normal posture. Extremities:  Without edema. Neurologic:  Alert and  oriented x4 Psych:  Normal mood and affect.    Assessment:     Plan:   ***   Aliene Altes, PA-C [facility] 05/10/2022

## 2022-05-10 ENCOUNTER — Ambulatory Visit: Payer: Medicaid Other | Admitting: Gastroenterology

## 2022-06-07 ENCOUNTER — Other Ambulatory Visit (HOSPITAL_COMMUNITY): Payer: Self-pay | Admitting: Family Medicine

## 2022-06-07 DIAGNOSIS — M48062 Spinal stenosis, lumbar region with neurogenic claudication: Secondary | ICD-10-CM

## 2022-06-30 ENCOUNTER — Ambulatory Visit (HOSPITAL_COMMUNITY)
Admission: RE | Admit: 2022-06-30 | Discharge: 2022-06-30 | Disposition: A | Discharge status: Home / Self Care | Payer: Medicaid Other | Source: Ambulatory Visit | Attending: Family Medicine | Admitting: Family Medicine

## 2022-06-30 DIAGNOSIS — M48062 Spinal stenosis, lumbar region with neurogenic claudication: Secondary | ICD-10-CM | POA: Diagnosis present

## 2022-08-01 ENCOUNTER — Inpatient Hospital Stay (HOSPITAL_COMMUNITY)
Admission: EM | Admit: 2022-08-01 | Discharge: 2022-08-03 | DRG: 381 | Disposition: A | Discharge status: Home / Self Care | Payer: Medicaid Other | Attending: Internal Medicine | Admitting: Internal Medicine

## 2022-08-01 ENCOUNTER — Emergency Department (HOSPITAL_COMMUNITY): Payer: Medicaid Other

## 2022-08-01 ENCOUNTER — Encounter (HOSPITAL_COMMUNITY): Payer: Self-pay

## 2022-08-01 ENCOUNTER — Other Ambulatory Visit: Payer: Self-pay

## 2022-08-01 DIAGNOSIS — K222 Esophageal obstruction: Secondary | ICD-10-CM | POA: Diagnosis present

## 2022-08-01 DIAGNOSIS — I10 Essential (primary) hypertension: Secondary | ICD-10-CM | POA: Diagnosis present

## 2022-08-01 DIAGNOSIS — K921 Melena: Secondary | ICD-10-CM

## 2022-08-01 DIAGNOSIS — F17200 Nicotine dependence, unspecified, uncomplicated: Secondary | ICD-10-CM | POA: Diagnosis present

## 2022-08-01 DIAGNOSIS — K922 Gastrointestinal hemorrhage, unspecified: Secondary | ICD-10-CM | POA: Diagnosis not present

## 2022-08-01 DIAGNOSIS — I2489 Other forms of acute ischemic heart disease: Secondary | ICD-10-CM | POA: Diagnosis present

## 2022-08-01 DIAGNOSIS — D62 Acute posthemorrhagic anemia: Secondary | ICD-10-CM | POA: Diagnosis not present

## 2022-08-01 DIAGNOSIS — D75839 Thrombocytosis, unspecified: Secondary | ICD-10-CM | POA: Diagnosis present

## 2022-08-01 DIAGNOSIS — K2100 Gastro-esophageal reflux disease with esophagitis, without bleeding: Secondary | ICD-10-CM | POA: Diagnosis present

## 2022-08-01 DIAGNOSIS — M549 Dorsalgia, unspecified: Secondary | ICD-10-CM | POA: Diagnosis present

## 2022-08-01 DIAGNOSIS — Z86010 Personal history of colonic polyps: Secondary | ICD-10-CM

## 2022-08-01 DIAGNOSIS — R1310 Dysphagia, unspecified: Secondary | ICD-10-CM | POA: Diagnosis present

## 2022-08-01 DIAGNOSIS — K449 Diaphragmatic hernia without obstruction or gangrene: Secondary | ICD-10-CM | POA: Diagnosis present

## 2022-08-01 DIAGNOSIS — Z820 Family history of epilepsy and other diseases of the nervous system: Secondary | ICD-10-CM

## 2022-08-01 DIAGNOSIS — Z79899 Other long term (current) drug therapy: Secondary | ICD-10-CM

## 2022-08-01 DIAGNOSIS — G8929 Other chronic pain: Secondary | ICD-10-CM | POA: Diagnosis present

## 2022-08-01 DIAGNOSIS — Z981 Arthrodesis status: Secondary | ICD-10-CM

## 2022-08-01 DIAGNOSIS — Z7951 Long term (current) use of inhaled steroids: Secondary | ICD-10-CM

## 2022-08-01 DIAGNOSIS — B3781 Candidal esophagitis: Secondary | ICD-10-CM | POA: Diagnosis present

## 2022-08-01 DIAGNOSIS — R739 Hyperglycemia, unspecified: Secondary | ICD-10-CM | POA: Diagnosis present

## 2022-08-01 DIAGNOSIS — R7989 Other specified abnormal findings of blood chemistry: Secondary | ICD-10-CM | POA: Insufficient documentation

## 2022-08-01 DIAGNOSIS — F17210 Nicotine dependence, cigarettes, uncomplicated: Secondary | ICD-10-CM | POA: Diagnosis present

## 2022-08-01 DIAGNOSIS — D509 Iron deficiency anemia, unspecified: Secondary | ICD-10-CM | POA: Diagnosis not present

## 2022-08-01 DIAGNOSIS — R0789 Other chest pain: Secondary | ICD-10-CM | POA: Diagnosis present

## 2022-08-01 DIAGNOSIS — Z7952 Long term (current) use of systemic steroids: Secondary | ICD-10-CM

## 2022-08-01 DIAGNOSIS — Z833 Family history of diabetes mellitus: Secondary | ICD-10-CM

## 2022-08-01 DIAGNOSIS — J449 Chronic obstructive pulmonary disease, unspecified: Secondary | ICD-10-CM | POA: Diagnosis present

## 2022-08-01 DIAGNOSIS — Z886 Allergy status to analgesic agent status: Secondary | ICD-10-CM

## 2022-08-01 DIAGNOSIS — K219 Gastro-esophageal reflux disease without esophagitis: Secondary | ICD-10-CM | POA: Diagnosis present

## 2022-08-01 DIAGNOSIS — D649 Anemia, unspecified: Secondary | ICD-10-CM

## 2022-08-01 DIAGNOSIS — E785 Hyperlipidemia, unspecified: Secondary | ICD-10-CM | POA: Diagnosis present

## 2022-08-01 DIAGNOSIS — K2211 Ulcer of esophagus with bleeding: Principal | ICD-10-CM | POA: Diagnosis present

## 2022-08-01 DIAGNOSIS — R195 Other fecal abnormalities: Secondary | ICD-10-CM

## 2022-08-01 DIAGNOSIS — R42 Dizziness and giddiness: Secondary | ICD-10-CM | POA: Diagnosis present

## 2022-08-01 DIAGNOSIS — K5730 Diverticulosis of large intestine without perforation or abscess without bleeding: Secondary | ICD-10-CM | POA: Diagnosis present

## 2022-08-01 LAB — HEPATIC FUNCTION PANEL
ALT: 13 U/L (ref 0–44)
AST: 17 U/L (ref 15–41)
Albumin: 3.5 g/dL (ref 3.5–5.0)
Alkaline Phosphatase: 43 U/L (ref 38–126)
Bilirubin, Direct: <0.1 mg/dL (ref 0.0–0.2)
Total Bilirubin: 0.2 mg/dL — ABNORMAL LOW (ref 0.3–1.2)
Total Protein: 6.5 g/dL (ref 6.5–8.1)

## 2022-08-01 LAB — CBC
HCT: 18.2 % — ABNORMAL LOW (ref 39.0–52.0)
Hemoglobin: 4.4 g/dL — CL (ref 13.0–17.0)
MCH: 15 pg — ABNORMAL LOW (ref 26.0–34.0)
MCHC: 24.2 g/dL — ABNORMAL LOW (ref 30.0–36.0)
MCV: 62.1 fL — ABNORMAL LOW (ref 80.0–100.0)
Platelets: 415 10*3/uL — ABNORMAL HIGH (ref 150–400)
RBC: 2.93 MIL/uL — ABNORMAL LOW (ref 4.22–5.81)
RDW: 22.1 % — ABNORMAL HIGH (ref 11.5–15.5)
WBC: 11.4 10*3/uL — ABNORMAL HIGH (ref 4.0–10.5)
nRBC: 0.4 % — ABNORMAL HIGH (ref 0.0–0.2)

## 2022-08-01 LAB — BASIC METABOLIC PANEL
Anion gap: 10 (ref 5–15)
BUN: 13 mg/dL (ref 8–23)
CO2: 23 mmol/L (ref 22–32)
Calcium: 8.4 mg/dL — ABNORMAL LOW (ref 8.9–10.3)
Chloride: 103 mmol/L (ref 98–111)
Creatinine, Ser: 1.02 mg/dL (ref 0.61–1.24)
GFR, Estimated: >60 mL/min (ref >60)
Glucose, Bld: 159 mg/dL — ABNORMAL HIGH (ref 70–99)
Potassium: 4.3 mmol/L (ref 3.5–5.1)
Sodium: 136 mmol/L (ref 135–145)

## 2022-08-01 LAB — APTT: aPTT: 23 seconds — ABNORMAL LOW (ref 24–36)

## 2022-08-01 LAB — ABO/RH: ABO/RH(D): A POS

## 2022-08-01 LAB — PROTIME-INR
INR: 1 (ref 0.8–1.2)
Prothrombin Time: 13.5 seconds (ref 11.4–15.2)

## 2022-08-01 LAB — POC OCCULT BLOOD, ED: Fecal Occult Bld: POSITIVE — AB

## 2022-08-01 LAB — LIPASE, BLOOD: Lipase: 39 U/L (ref 11–51)

## 2022-08-01 LAB — TROPONIN I (HIGH SENSITIVITY)
Troponin I (High Sensitivity): 125 ng/L (ref <18)
Troponin I (High Sensitivity): 138 ng/L (ref <18)

## 2022-08-01 LAB — PREPARE RBC (CROSSMATCH)

## 2022-08-01 LAB — MAGNESIUM: Magnesium: 2.2 mg/dL (ref 1.7–2.4)

## 2022-08-01 MED ORDER — NITROGLYCERIN 2 % TD OINT
1.0000 [in_us] | TOPICAL_OINTMENT | Freq: Once | TRANSDERMAL | Status: AC
  Administered 2022-08-01: 1 [in_us] via TOPICAL
  Filled 2022-08-01: qty 1

## 2022-08-01 MED ORDER — PANTOPRAZOLE 80MG IVPB - SIMPLE MED
80.0000 mg | Freq: Once | INTRAVENOUS | Status: AC
  Administered 2022-08-01: 80 mg via INTRAVENOUS
  Filled 2022-08-01: qty 100

## 2022-08-01 MED ORDER — SODIUM CHLORIDE 0.9% IV SOLUTION: Freq: Once | INTRAVENOUS | Status: AC

## 2022-08-01 MED ORDER — FENTANYL CITRATE PF 50 MCG/ML IJ SOSY
50.0000 ug | PREFILLED_SYRINGE | Freq: Once | INTRAMUSCULAR | Status: AC
  Administered 2022-08-01: 50 ug via INTRAVENOUS
  Filled 2022-08-01: qty 1

## 2022-08-01 MED ORDER — METOPROLOL TARTRATE 25 MG PO TABS
25.0000 mg | ORAL_TABLET | Freq: Once | ORAL | Status: AC
Start: 2022-08-01 — End: 2022-08-01
  Administered 2022-08-01: 25 mg via ORAL
  Filled 2022-08-01: qty 1

## 2022-08-01 MED ORDER — IPRATROPIUM-ALBUTEROL 0.5-2.5 (3) MG/3ML IN SOLN: 3.0000 mL | Freq: Four times a day (QID) | RESPIRATORY_TRACT | Status: DC | PRN

## 2022-08-01 MED ORDER — UMECLIDINIUM BROMIDE 62.5 MCG/ACT IN AEPB
1.0000 | INHALATION_SPRAY | Freq: Every day | RESPIRATORY_TRACT | Status: DC
  Administered 2022-08-02 – 2022-08-03 (×2): 1 via RESPIRATORY_TRACT
  Filled 2022-08-01: qty 7

## 2022-08-01 MED ORDER — HEPARIN (PORCINE) 25000 UT/250ML-% IV SOLN: 950.0000 [IU]/h | INTRAVENOUS | Status: DC

## 2022-08-01 MED ORDER — ALBUTEROL SULFATE HFA 108 (90 BASE) MCG/ACT IN AERS: 2.0000 | INHALATION_SPRAY | RESPIRATORY_TRACT | Status: DC | PRN

## 2022-08-01 MED ORDER — PANTOPRAZOLE INFUSION (NEW) - SIMPLE MED
8.0000 mg/h | INTRAVENOUS | Status: DC
  Administered 2022-08-01 – 2022-08-03 (×4): 8 mg/h via INTRAVENOUS
  Filled 2022-08-01 (×9): qty 100

## 2022-08-01 MED ORDER — FLUTICASONE FUROATE-VILANTEROL 100-25 MCG/ACT IN AEPB
1.0000 | INHALATION_SPRAY | Freq: Every day | RESPIRATORY_TRACT | Status: DC
  Administered 2022-08-02 – 2022-08-03 (×2): 1 via RESPIRATORY_TRACT
  Filled 2022-08-01: qty 28

## 2022-08-01 MED ORDER — ASPIRIN 81 MG PO CHEW
324.0000 mg | CHEWABLE_TABLET | Freq: Once | ORAL | Status: AC
  Administered 2022-08-01: 324 mg via ORAL
  Filled 2022-08-01: qty 4

## 2022-08-01 MED ORDER — PANTOPRAZOLE SODIUM 40 MG IV SOLR: 40.0000 mg | Freq: Two times a day (BID) | INTRAVENOUS | Status: DC

## 2022-08-01 MED ORDER — HEPARIN BOLUS VIA INFUSION: 4000.0000 [IU] | Freq: Once | INTRAVENOUS | Status: DC

## 2022-08-01 NOTE — ED Triage Notes (Signed)
Pt c/o SOB x 1 week, states he has been doing breathing treatments at home but reports they cause him to have severe chest pain.   Pt also c/o right ankle pain.

## 2022-08-01 NOTE — ED Provider Notes (Signed)
[facility] EMERGENCY DEPARTMENT Provider Note   CSN: [id number] Arrival date & time: 08/01/22  1847     History  Chief Complaint  Patient presents with   Shortness of Branch is a 62 y.o. male.   Shortness of Breath Associated symptoms: chest pain   Patient presents for chest pain.  Medical history includes COPD, prior pneumothorax, anemia, HTN, HLD, tobacco use.  He has been treating his COPD with nebulized breathing treatments.  Over the past week, he will develop bilateral anterior chest pain following these breathing treatments.  His most recent breathing treatment was 2 hours ago.  Again, he developed severe chest pain.  This chest pain has since diminished slightly.  He had nausea which he attributes to muscle relaxer that he has been taking.  He has not had vomiting.  Currently, he denies nausea.  He does endorse ongoing chest pain.     Home Medications Prior to Admission medications   Medication Sig Start Date End Date Taking? Authorizing Provider  albuterol (PROAIR HFA) 108 (90 Base) MCG/ACT inhaler Inhale 2 puffs into the lungs every 4 (four) hours as needed for wheezing or shortness of breath. 03/01/22  Yes Martyn Ehrich, NP  Fluticasone-Umeclidin-Vilant (TRELEGY ELLIPTA) 100-62.5-25 MCG/INH AEPB Inhale 1 puff into the lungs daily. 11/01/20  Yes Carmon Sails J, PA-C  gabapentin (NEURONTIN) 600 MG tablet Take 600 mg by mouth 3 (three) times daily. 03/14/21  Yes [provider]  ipratropium-albuterol (DUONEB) 0.5-2.5 (3) MG/3ML SOLN Take 3 mLs by nebulization every 6 (six) hours as needed. 03/02/22  Yes Martyn Ehrich, NP  methocarbamol (ROBAXIN) 500 MG tablet Take by mouth. 07/05/22  Yes [provider]  pantoprazole (PROTONIX) 40 MG tablet Take 1 tablet (40 mg total) by mouth 2 (two) times daily before a meal. 02/22/22  Yes Rourk, Cristopher Estimable, MD  predniSONE (DELTASONE) 20 MG tablet Take 2 tablets (40 mg total) by mouth daily with  breakfast. Patient taking differently: Take 10 mg by mouth daily with breakfast. 03/01/22  Yes Martyn Ehrich, NP  QUEtiapine (SEROQUEL) 50 MG tablet Take 50 mg by mouth at bedtime.   Yes [provider]  Fluticasone-Umeclidin-Vilant (TRELEGY ELLIPTA) 100-62.5-25 MCG/ACT AEPB Inhale 28 each into the lungs daily. Patient not taking: Reported on 08/01/2022 03/01/22   Martyn Ehrich, NP      Allergies    Advil [ibuprofen] and Aleve [naproxen sodium]    Review of Systems   Review of Systems  Respiratory:  Positive for shortness of breath.   Cardiovascular:  Positive for chest pain.  Gastrointestinal:  Positive for nausea.  Musculoskeletal:  Positive for arthralgias.  All other systems reviewed and are negative.   Physical Exam Updated Vital Signs BP 131/71   Pulse (!) 113   Temp 97.7 F (36.5 C) (Oral)   Resp (!) 22   Ht '6\' 2"'$  (1.88 m)   Wt 80 kg   SpO2 93%   BMI 22.64 kg/m  Physical Exam Vitals and nursing note reviewed.  Constitutional:      General: He is not in acute distress.    Appearance: He is well-developed. He is not ill-appearing, toxic-appearing or diaphoretic.  HENT:     Head: Normocephalic and atraumatic.     Mouth/Throat:     Mouth: Mucous membranes are moist.  Eyes:     Conjunctiva/sclera: Conjunctivae normal.  Cardiovascular:     Rate and Rhythm: Normal rate and regular rhythm.  Heart sounds: No murmur heard. Pulmonary:     Effort: Pulmonary effort is normal. No tachypnea or respiratory distress.     Breath sounds: Normal breath sounds. No decreased breath sounds or wheezing.  Chest:     Chest wall: No tenderness.  Abdominal:     Palpations: Abdomen is soft.     Tenderness: There is no abdominal tenderness.  Musculoskeletal:        General: No swelling.     Cervical back: Normal range of motion and neck supple.     Right lower leg: No edema.     Left lower leg: No edema.  Skin:    General: Skin is warm and dry.     Capillary  Refill: Capillary refill takes less than 2 seconds.     Coloration: Skin is not cyanotic or pale.  Neurological:     General: No focal deficit present.     Mental Status: He is alert and oriented to person, place, and time.  Psychiatric:        Mood and Affect: Mood normal.        Behavior: Behavior normal.     ED Results / Procedures / Treatments   Labs (all labs ordered are listed, but only abnormal results are displayed) Labs Reviewed  BASIC METABOLIC PANEL - Abnormal; Notable for the following components:      Result Value   Glucose, Bld 159 (*)    Calcium 8.4 (*)    All other components within normal limits  CBC - Abnormal; Notable for the following components:   WBC 11.4 (*)    RBC 2.93 (*)    Hemoglobin 4.4 (*)    HCT 18.2 (*)    MCV 62.1 (*)    MCH 15.0 (*)    MCHC 24.2 (*)    RDW 22.1 (*)    Platelets 415 (*)    nRBC 0.4 (*)    All other components within normal limits  APTT - Abnormal; Notable for the following components:   aPTT 23 (*)    All other components within normal limits  POC OCCULT BLOOD, ED - Abnormal; Notable for the following components:   Fecal Occult Bld POSITIVE (*)    All other components within normal limits  TROPONIN I (HIGH SENSITIVITY) - Abnormal; Notable for the following components:   Troponin I (High Sensitivity) 125 (*)    All other components within normal limits  MAGNESIUM  LIPASE, BLOOD  PROTIME-INR  HEPATIC FUNCTION PANEL  CBG MONITORING, ED  ABO/RH  TYPE AND SCREEN  PREPARE RBC (CROSSMATCH)  TROPONIN I (HIGH SENSITIVITY)    EKG None  Radiology DG Chest 2 View  Result Date: 08/01/2022 CLINICAL DATA:  Shortness of breath.  History of COPD. EXAM: CHEST - 2 VIEW COMPARISON:  02/27/2022 FINDINGS: Mild cardiac enlargement. Bronchiectasis with peribronchial thickening and central interstitial changes likely chronic bronchitis. No airspace disease or consolidation in the lungs. No pleural effusions. No pneumothorax.  Mediastinal contours appear intact. Multiple old left rib fractures. Moderate-sized esophageal hiatal hernia. Similar appearance to previous study. IMPRESSION: Chronic bronchitic changes in the lungs. Old left rib fractures. No evidence of active pulmonary disease. Electronically Signed   By: Lucienne Capers M.D.   On: 08/01/2022 20:28    Procedures Procedures    Medications Ordered in ED Medications  albuterol (VENTOLIN HFA) 108 (90 Base) MCG/ACT inhaler 2 puff (has no administration in time range)  0.9 %  sodium chloride infusion (Manually program via Guardrails IV  Fluids) (has no administration in time range)  pantoprazole (PROTONIX) 80 mg /NS 100 mL IVPB (80 mg Intravenous New Bag/Given 08/01/22 2053)  pantoprozole (PROTONIX) 80 mg /NS 100 mL infusion (8 mg/hr Intravenous New Bag/Given 08/01/22 2053)  pantoprazole (PROTONIX) injection 40 mg (has no administration in time range)  aspirin chewable tablet 324 mg (324 mg Oral Given 08/01/22 1945)  metoprolol tartrate (LOPRESSOR) tablet 25 mg (25 mg Oral Given 08/01/22 1945)  nitroGLYCERIN (NITROGLYN) 2 % ointment 1 inch (1 inch Topical Given 08/01/22 1945)  fentaNYL (SUBLIMAZE) injection 50 mcg (50 mcg Intravenous Given 08/01/22 1949)  fentaNYL (SUBLIMAZE) injection 50 mcg (50 mcg Intravenous Given 08/01/22 2055)    ED Course/ Medical Decision Making/ A&P                             Medical Decision Making Amount and/or Complexity of Data Reviewed Labs: ordered. Radiology: ordered.  Risk OTC drugs. Prescription drug management.   This patient presents to the ED for concern of chest pain, this involves an extensive number of treatment options, and is a complaint that carries with it a high risk of complications and morbidity.  The differential diagnosis includes ACS, pneumonia, COPD, anemia   Co morbidities that complicate the patient evaluation  COPD, prior pneumothorax, anemia, HTN, HLD, tobacco use   Additional history  obtained:  Additional history obtained from N/A External records from outside source obtained and reviewed including EMR   Lab Tests:  I Ordered, and personally interpreted labs.  The pertinent results include: Severe anemia with hemoglobin of 4.4.  Baseline appears to be 13.  There is a mild leukocytosis.  There is a new microcytosis.  Kidney function and electrolytes are normal.  Initial troponin is mildly elevated at 125.  I suspect this is demand ischemia from his symptomatic anemia.   Imaging Studies ordered:  I ordered imaging studies including chest x-ray I independently visualized and interpreted imaging which showed no acute findings I agree with the radiologist interpretation   Cardiac Monitoring: / EKG:  The patient was maintained on a cardiac monitor.  I personally viewed and interpreted the cardiac monitored which showed an underlying rhythm of: Sinus rhythm   Consultations Obtained:  I requested consultation with the gastroenterologist, Dr. Jenetta Downer,  and discussed lab and imaging findings as well as pertinent plan - they recommend: Agrees with blood and PPI, n.p.o. at midnight, GI will see in the morning   Problem List / ED Course / Critical interventions / Medication management  Patient presents for chest pain.  He reports severe anterior chest pain following breathing treatments.  He does breathing treatments several times a day for his COPD.  On arrival in the ED, vital signs are notable for tachycardia.  On assessment, patient is pale and ill-appearing.  He has had recent nausea but denies any currently.  He does endorse ongoing chest pain following his breathing treatment 2 hours ago.  On EKG, there were some areas of ST segment depression.  Patient has risk factors for CAD.  Chest pain workup was initiated.  324 ASA and heparin was ordered.  Fortunately, acute anemia was identified prior to heparin being started.  Lab work shows severe anemia with hemoglobin of  4.4.  Baseline appears to be 13.  There is a mild leukocytosis.  There is a new microcytosis.  Kidney function and electrolytes are normal.  Initial troponin is mildly elevated at 125.  I suspect  this is demand ischemia from his symptomatic anemia.  Order for heparin was discontinued.  Patient denies any known recent blood loss.  He does state that he will have occasional dark stools.  On DRE, melanotic stool is present.  He was consented for blood transfusion.  Protonix was ordered.  Following pain medication, patient endorses improved pain in his chest.  He does report ongoing pain in his right ankle.  He states that he suffered a ankle rolling injury 3 weeks ago.  He has continued to walk on it.  X-ray of ankle was ordered.  Additional fentanyl was ordered.  I spoke to gastroenterologist on-call, Dr. Jenetta Downer.  Dr. Jenetta Downer will see in consult tomorrow.  He requested n.p.o. status at midnight.  Patient was admitted to hospitalist for further management. I ordered medication including ASA for concern of ACS, PRBCs for symptomatic anemia; fentanyl for analgesia; Protonix for suspected UGIB Reevaluation of the patient after these medicines showed that the patient improved I have reviewed the patients home medicines and have made adjustments as needed   Social Determinants of Health:  Has access to outpatient care  CRITICAL CARE Performed by: Godfrey Pick   Total critical care time: 32 minutes  Critical care time was exclusive of separately billable procedures and treating other patients.  Critical care was necessary to treat or prevent imminent or life-threatening deterioration.  Critical care was time spent personally by me on the following activities: development of treatment plan with patient and/or surrogate as well as nursing, discussions with consultants, evaluation of patient's response to treatment, examination of patient, obtaining history from patient or surrogate, ordering and  performing treatments and interventions, ordering and review of laboratory studies, ordering and review of radiographic studies, pulse oximetry and re-evaluation of patient's condition.          Final Clinical Impression(s) / ED Diagnoses Final diagnoses:  Symptomatic anemia  Gastrointestinal hemorrhage with melena    Rx / DC Orders ED Discharge Orders     None         Godfrey Pick, MD 08/01/22 2102

## 2022-08-01 NOTE — H&P (Addendum)
History and Physical    Patient: Sean Thomas MVE:[id number] DOB: 1961-04-25 DOA: 08/01/2022 DOS: the patient was seen and examined on 08/01/2022 PCP: Pcp, No  Patient coming from: Home  Chief Complaint:  Chief Complaint  Patient presents with   Shortness of Breath   HPI: [name] is a 62 y.o. male with medical history significant of COPD, GERD, tobacco abuse who presents to the emergency department due to chest pain which usually follows his present treatment.  Patient states that he has been compliant with his breathing treatment at home and that over the past few weeks, he has been having bilateral anterior chest pain, which appears to be reproducible and usually worsens after taking a breathing treatment.  Patient decided to go to the ED for further evaluation and management due to experiencing same chest pain after taking breathing treatment about 2 hours PTA.  He complained of nausea which was attributable to a muscle relaxer he was taking.   ED Course:  In the emergency department, she was tachypneic and tachycardic, BP was 131/71, O2 sat was 93% on room air and temperature was 97.7 F.  Workup in the ED showed WBC 11.4, hemoglobin 4.4, hematocrit 18.2, MCV 62.1, platelets 415.  Troponin x 1-125 > 138, BMP was normal except for blood glucose of 159.  FOBT was positive, lipase 39, magnesium 2.2. Chest x-ray showed chronic bronchitic changes in the lungs.  Old left rib fractures.  No evidence of active pulmonary artery disease Right ankle x-ray showed no acute fracture or dislocation Type and screen was done, 3 units of PRBC ordered to be transfused in the ED.  Gastroenterologist was consulted and recommended admitting patient with plan to consult on patient nitroglycerin patch was given, patient was started on Protonix drip.  Hospitalist was asked admit patient for further evaluation and management.  Review of Systems: Review of systems as noted in the HPI. All other systems  reviewed and are negative.   Past Medical History:  Diagnosis Date   Cervical pain 11/21/2012   COPD (chronic obstructive pulmonary disease) (HCC)    DDD (degenerative disc disease)    Esophageal dysphagia    Essential hypertension    Fracture of multiple ribs 11/01/2012   Fracture of occipital condyle ([place]) 11/21/2012   Headache    Hyperlipidemia    Inguinal hernia 12/19/2013   Microcytic anemia    N&V (nausea and vomiting) 05/23/2016   Past Surgical History:  Procedure Laterality Date   BACK SURGERY     COLONOSCOPY WITH PROPOFOL N/A 06/15/2016   RMR: inadequate bowel prep all mucosal surfaces not well seen. 5 mm tubular adenoma removed from the hepatic flexure. 3 mm polyp from the rectum was hyperplastic. He had scattered diverticula   ESOPHAGEAL BRUSHING  12/29/2019   Procedure: ESOPHAGEAL BRUSHING;  Surgeon: Daneil Dolin, MD;  [facility];  Service: Endoscopy;;   ESOPHAGOGASTRODUODENOSCOPY (EGD) WITH PROPOFOL N/A 06/15/2016   Dr. Gala Romney: Large hiatal hernia, reflux esophagitis, Schatzki ring with focal area of ulceration, status post dilation with 32F Maloney dilator with moderate improvement in luminal narrowing.   ESOPHAGOGASTRODUODENOSCOPY (EGD) WITH PROPOFOL N/A 12/29/2019   Surgeon: Daneil Dolin, MD; Candida esophagitis, Schatzki ring status post dilation, medium sized hiatal hernia with a single Cameron lesion.  KOH positive, treated with Diflucan for 21 days.   ESOPHAGOGASTRODUODENOSCOPY (EGD) WITH PROPOFOL N/A 02/22/2022   Procedure: ESOPHAGOGASTRODUODENOSCOPY (EGD) WITH PROPOFOL;  Surgeon: Daneil Dolin, MD;  [facility];  Service: Endoscopy;  Laterality: N/A;  1:00pm   INGUINAL HERNIA REPAIR Left 07/24/2014   Procedure: LAPAROSCOPIC LEFT INGUINAL HERNIA REPAIR ;  Surgeon: Ralene Ok, MD;  Location: [place];  Service: General;  Laterality: Left;   INGUINAL HERNIA REPAIR Right 08/17/2021   Procedure: HERNIA REPAIR INGUINAL ADULT W/ MESH;   Surgeon: Aviva Signs, MD;  Location: [facility];  Service: General;  Laterality: Right;   INSERTION OF MESH Left 07/24/2014   Procedure: INSERTION OF MESH;  Surgeon: Ralene Ok, MD;  Location: [place];  Service: General;  Laterality: Left;   [place] N/A 06/15/2016   Procedure: Venia Minks DILATION;  Surgeon: Daneil Dolin, MD;  [facility];  Service: Endoscopy;  Laterality: N/A;   MALONEY DILATION N/A 12/29/2019   Procedure: Venia Minks DILATION;  Surgeon: Daneil Dolin, MD;  [facility];  Service: Endoscopy;  Laterality: N/A;   MALONEY DILATION N/A 02/22/2022   Procedure: Venia Minks DILATION;  Surgeon: Daneil Dolin, MD;  [facility];  Service: Endoscopy;  Laterality: N/A;   POLYPECTOMY  06/15/2016   Procedure: POLYPECTOMY;  Surgeon: Daneil Dolin, MD;  [facility];  Service: Endoscopy;;  colon   right lung surgery Right     Social History:  reports that he has been smoking cigarettes. He has a 11.25 pack-year smoking history. He has never used smokeless tobacco. He reports current alcohol use of about 9.0 standard drinks of alcohol per week. He reports current drug use. Drug: Marijuana.   Allergies  Allergen Reactions   Advil [Ibuprofen] Diarrhea   Aleve [Naproxen Sodium] Nausea And Vomiting    diarrhea    Family History  Problem Relation Age of Onset   Diabetes Mother    Alzheimer's disease Mother    Diabetes Sister    Colon cancer Neg Hx    Liver disease Neg Hx      Prior to Admission medications   Medication Sig Start Date End Date Taking? Authorizing Provider  albuterol (PROAIR HFA) 108 (90 Base) MCG/ACT inhaler Inhale 2 puffs into the lungs every 4 (four) hours as needed for wheezing or shortness of breath. 03/01/22  Yes Martyn Ehrich, NP  Fluticasone-Umeclidin-Vilant (TRELEGY ELLIPTA) 100-62.5-25 MCG/INH AEPB Inhale 1 puff into the lungs daily. 11/01/20  Yes Carmon Sails J, PA-C  gabapentin  (NEURONTIN) 600 MG tablet Take 600 mg by mouth 3 (three) times daily. 03/14/21  Yes [provider]  ipratropium-albuterol (DUONEB) 0.5-2.5 (3) MG/3ML SOLN Take 3 mLs by nebulization every 6 (six) hours as needed. 03/02/22  Yes Martyn Ehrich, NP  methocarbamol (ROBAXIN) 500 MG tablet Take by mouth. 07/05/22  Yes [provider]  pantoprazole (PROTONIX) 40 MG tablet Take 1 tablet (40 mg total) by mouth 2 (two) times daily before a meal. 02/22/22  Yes Rourk, Cristopher Estimable, MD  predniSONE (DELTASONE) 20 MG tablet Take 2 tablets (40 mg total) by mouth daily with breakfast. Patient taking differently: Take 10 mg by mouth daily with breakfast. 03/01/22  Yes Martyn Ehrich, NP  QUEtiapine (SEROQUEL) 50 MG tablet Take 50 mg by mouth at bedtime.   Yes [provider]  Fluticasone-Umeclidin-Vilant (TRELEGY ELLIPTA) 100-62.5-25 MCG/ACT AEPB Inhale 28 each into the lungs daily. Patient not taking: Reported on 08/01/2022 03/01/22   Martyn Ehrich, NP    Physical Exam: BP (!) 93/59   Pulse 92   Temp 98.1 F (36.7 C) (Oral)   Resp 16  Ht '6\' 2"'$  (1.88 m)   Wt 80 kg   SpO2 93%   BMI 22.64 kg/m   General: 62 y.o. year-old male well developed well nourished in no acute distress.  Alert and oriented x3. HEENT: NCAT, EOMI Neck: Supple, trachea medial Cardiovascular: Regular rate and rhythm with no rubs or gallops.  No thyromegaly or JVD noted.  No lower extremity edema. 2/4 pulses in all 4 extremities. Respiratory: Clear to auscultation with no wheezes or rales. Good inspiratory effort. Abdomen: Soft, nontender nondistended with normal bowel sounds x4 quadrants. Muskuloskeletal: No cyanosis, clubbing or edema noted bilaterally Neuro: CN II-XII intact, strength 5/5 x 4, sensation, reflexes intact Skin: No ulcerative lesions noted or rashes Psychiatry: Judgement and insight appear normal. Mood is appropriate for condition and setting          Labs on Admission:  Basic  Metabolic Panel: Recent Labs  Lab 08/01/22 1945  NA 136  K 4.3  CL 103  CO2 23  GLUCOSE 159*  BUN 13  CREATININE 1.02  CALCIUM 8.4*  MG 2.2   Liver Function Tests: Recent Labs  Lab 08/01/22 1945  AST 17  ALT 13  ALKPHOS 43  BILITOT 0.2*  PROT 6.5  ALBUMIN 3.5   Recent Labs  Lab 08/01/22 1945  LIPASE 39   No results for input(s): "AMMONIA" in the last 168 hours. CBC: Recent Labs  Lab 08/01/22 1945  WBC 11.4*  HGB 4.4*  HCT 18.2*  MCV 62.1*  PLT 415*   Cardiac Enzymes: No results for input(s): "CKTOTAL", "CKMB", "CKMBINDEX", "TROPONINI" in the last 168 hours.  BNP (last 3 results) Recent Labs    02/16/22 0235  BNP 28.0    ProBNP (last 3 results) No results for input(s): "PROBNP" in the last 8760 hours.  CBG: No results for input(s): "GLUCAP" in the last 168 hours.  Radiological Exams on Admission: DG Ankle 2 Views Right  Result Date: 08/01/2022 CLINICAL DATA:  Right ankle pain following injury 3 weeks ago. EXAM: RIGHT ANKLE - 2 VIEW COMPARISON:  None Available. FINDINGS: There is no evidence of fracture, dislocation, or joint effusion. Degenerative changes are noted in the midfoot and ankle. Mild calcaneal spurring. Mild soft tissue swelling is present over the lateral malleolus. Dystrophic calcifications are noted in the soft tissues. IMPRESSION: No acute fracture or dislocation. Electronically Signed   By: Brett Fairy M.D.   On: 08/01/2022 21:33   DG Chest 2 View  Result Date: 08/01/2022 CLINICAL DATA:  Shortness of breath.  History of COPD. EXAM: CHEST - 2 VIEW COMPARISON:  02/27/2022 FINDINGS: Mild cardiac enlargement. Bronchiectasis with peribronchial thickening and central interstitial changes likely chronic bronchitis. No airspace disease or consolidation in the lungs. No pleural effusions. No pneumothorax. Mediastinal contours appear intact. Multiple old left rib fractures. Moderate-sized esophageal hiatal hernia. Similar appearance to previous  study. IMPRESSION: Chronic bronchitic changes in the lungs. Old left rib fractures. No evidence of active pulmonary disease. Electronically Signed   By: Lucienne Capers M.D.   On: 08/01/2022 20:28    EKG: I independently viewed the EKG done and my findings are as followed: Sinus tachycardia at a rate of 114 bpm  Assessment/Plan Present on Admission:  GI bleed  Iron deficiency anemia  Microcytic anemia  Tobacco use disorder  Hyperglycemia  COPD (chronic obstructive pulmonary disease) (HCC)  GERD (gastroesophageal reflux disease)  Principal Problem:   GI bleed Active Problems:   COPD (chronic obstructive pulmonary disease) (HCC)   Hyperglycemia  Iron deficiency anemia   GERD (gastroesophageal reflux disease)   Tobacco use disorder   Microcytic anemia   Acute blood loss anemia   Elevated troponin   Thrombocytosis  Acute GI bleed Acute blood loss anemia H/H= 4.4/18.2, this was 12.8/41.7 on 02/27/2022 Hemoccult was positive Type and crossmatch was done 3 units of PRBC was ordered to be transfused in the ED Continue IV Protonix drip Gastroenterologist  was consulted and will follow-up on patient in the morning per EDP  Iron deficiency anemia/microcytic anemia Consider ferrous sulfate when patient resumes oral intake Iron studies will be done  Elevated troponin possibly secondary to type II demand ischemia, rule out ACS Troponin 125 > 138 > 173, continue to trend troponin Heparin cannot be started at this time due to patient's ongoing GI bleed Continue telemetry Patient currently has no chest pain  Consider cardiology consult for worsening of symptoms  Thrombocytosis possibly reactive Platelets 415, continue to monitor platelet levels will monitor labs  Hyperglycemia possibly reactive CBG 159, no history of T2DM Continue to monitor blood glucose with morning labs  COPD Continue DuoNeb, Breo and Incruse Ellipta  GERD Continue Protonix drip   DVT prophylaxis:  SCDs  Code Status: Full code  Family Communication: None at bedside  Consults: Gastroenterology by EDP  Severity of Illness: The appropriate patient status for this patient is OBSERVATION. Observation status is judged to be reasonable and necessary in order to provide the required intensity of service to ensure the patient's safety. The patient's presenting symptoms, physical exam findings, and initial radiographic and laboratory data in the context of their medical condition is felt to place them at decreased risk for further clinical deterioration. Furthermore, it is anticipated that the patient will be medically stable for discharge from the hospital within 2 midnights of admission.   Author: Bernadette Hoit, DO 08/01/2022 11:41 PM  For on call review [email or web address].

## 2022-08-01 NOTE — Progress Notes (Signed)
ANTICOAGULATION CONSULT NOTE - Initial Consult  Pharmacy Consult for IV heparin Indication: chest pain/ACS  Allergies  Allergen Reactions   Advil [Ibuprofen] Diarrhea   Aleve [Naproxen Sodium] Nausea And Vomiting    diarrhea    Patient Measurements: Height: '6\' 2"'$  (188 cm) Weight: 80 kg (176 lb 5.9 oz) IBW/kg (Calculated) : 82.2 Heparin Dosing Weight: 80 kg  Vital Signs: Temp: 97.7 F (36.5 C) (01/16 1907) Temp Source: Oral (01/16 1907) BP: 127/80 (01/16 1907) Pulse Rate: 122 (01/16 1907)  Labs: No results for input(s): "HGB", "HCT", "PLT", "APTT", "LABPROT", "INR", "HEPARINUNFRC", "HEPRLOWMOCWT", "CREATININE", "CKTOTAL", "CKMB", "TROPONINIHS" in the last 72 hours.  CrCl cannot be calculated (Patient's most recent lab result is older than the maximum 21 days allowed.).   Medical History: Past Medical History:  Diagnosis Date   Cervical pain 11/21/2012   COPD (chronic obstructive pulmonary disease) (HCC)    DDD (degenerative disc disease)    Esophageal dysphagia    Essential hypertension    Fracture of multiple ribs 11/01/2012   Fracture of occipital condyle (HCC) 11/21/2012   Headache    Hyperlipidemia    Inguinal hernia 12/19/2013   Microcytic anemia    N&V (nausea and vomiting) 05/23/2016    Medications:  No anticoagulation medications listed in PTA med list or dispense history  Assessment: Pharmacy consulted to dose IV heparin for ACS/NSTEMI for this 62 yo male who presented with chest pain.   Troponin, BMP and CBC to be collected.   1/16 EKG shows abnormality; ST depression, consider subendocardial injury.  Goal of Therapy:  Heparin level 0.3-0.7 units/ml Monitor platelets by anticoagulation protocol: Yes   Plan:  STAT Baseline aPTT and PT/INR Heparin 4000 units IV heparin bolus per infusion then 950 units/hr 6 hour heparin level Monitor daily heparin level, CBC, signs/symptoms of bleeding   Thank you for allowing pharmacy to be a part of this  patient's care.  Royetta Asal, PharmD, BCPS Clinical Pharmacist [place] [facility] Please utilize Amion for appropriate phone number to reach the unit pharmacist 08/01/2022 7:48 PM

## 2022-08-02 ENCOUNTER — Encounter (HOSPITAL_COMMUNITY): Payer: Self-pay | Admitting: Internal Medicine

## 2022-08-02 ENCOUNTER — Inpatient Hospital Stay (HOSPITAL_COMMUNITY): Payer: Medicaid Other

## 2022-08-02 ENCOUNTER — Other Ambulatory Visit (HOSPITAL_COMMUNITY): Payer: Self-pay | Admitting: *Deleted

## 2022-08-02 DIAGNOSIS — K922 Gastrointestinal hemorrhage, unspecified: Secondary | ICD-10-CM | POA: Diagnosis present

## 2022-08-02 DIAGNOSIS — Z820 Family history of epilepsy and other diseases of the nervous system: Secondary | ICD-10-CM | POA: Diagnosis not present

## 2022-08-02 DIAGNOSIS — R195 Other fecal abnormalities: Secondary | ICD-10-CM | POA: Diagnosis not present

## 2022-08-02 DIAGNOSIS — Z86010 Personal history of colonic polyps: Secondary | ICD-10-CM | POA: Diagnosis not present

## 2022-08-02 DIAGNOSIS — D509 Iron deficiency anemia, unspecified: Secondary | ICD-10-CM | POA: Diagnosis present

## 2022-08-02 DIAGNOSIS — R0602 Shortness of breath: Secondary | ICD-10-CM | POA: Diagnosis not present

## 2022-08-02 DIAGNOSIS — E785 Hyperlipidemia, unspecified: Secondary | ICD-10-CM | POA: Diagnosis present

## 2022-08-02 DIAGNOSIS — J449 Chronic obstructive pulmonary disease, unspecified: Secondary | ICD-10-CM | POA: Diagnosis present

## 2022-08-02 DIAGNOSIS — Z886 Allergy status to analgesic agent status: Secondary | ICD-10-CM | POA: Diagnosis not present

## 2022-08-02 DIAGNOSIS — Z7951 Long term (current) use of inhaled steroids: Secondary | ICD-10-CM | POA: Diagnosis not present

## 2022-08-02 DIAGNOSIS — D75839 Thrombocytosis, unspecified: Secondary | ICD-10-CM | POA: Diagnosis present

## 2022-08-02 DIAGNOSIS — B3781 Candidal esophagitis: Secondary | ICD-10-CM | POA: Diagnosis present

## 2022-08-02 DIAGNOSIS — Z981 Arthrodesis status: Secondary | ICD-10-CM | POA: Diagnosis not present

## 2022-08-02 DIAGNOSIS — K2211 Ulcer of esophagus with bleeding: Secondary | ICD-10-CM | POA: Diagnosis present

## 2022-08-02 DIAGNOSIS — D62 Acute posthemorrhagic anemia: Secondary | ICD-10-CM | POA: Diagnosis present

## 2022-08-02 DIAGNOSIS — D500 Iron deficiency anemia secondary to blood loss (chronic): Secondary | ICD-10-CM

## 2022-08-02 DIAGNOSIS — R739 Hyperglycemia, unspecified: Secondary | ICD-10-CM | POA: Diagnosis present

## 2022-08-02 DIAGNOSIS — Z833 Family history of diabetes mellitus: Secondary | ICD-10-CM | POA: Diagnosis not present

## 2022-08-02 DIAGNOSIS — K449 Diaphragmatic hernia without obstruction or gangrene: Secondary | ICD-10-CM | POA: Diagnosis present

## 2022-08-02 DIAGNOSIS — R7989 Other specified abnormal findings of blood chemistry: Secondary | ICD-10-CM

## 2022-08-02 DIAGNOSIS — I2489 Other forms of acute ischemic heart disease: Secondary | ICD-10-CM | POA: Diagnosis present

## 2022-08-02 DIAGNOSIS — Z7952 Long term (current) use of systemic steroids: Secondary | ICD-10-CM | POA: Diagnosis not present

## 2022-08-02 DIAGNOSIS — I10 Essential (primary) hypertension: Secondary | ICD-10-CM | POA: Diagnosis present

## 2022-08-02 DIAGNOSIS — K5730 Diverticulosis of large intestine without perforation or abscess without bleeding: Secondary | ICD-10-CM | POA: Diagnosis present

## 2022-08-02 DIAGNOSIS — K222 Esophageal obstruction: Secondary | ICD-10-CM | POA: Diagnosis present

## 2022-08-02 DIAGNOSIS — K2210 Ulcer of esophagus without bleeding: Secondary | ICD-10-CM | POA: Diagnosis not present

## 2022-08-02 DIAGNOSIS — M549 Dorsalgia, unspecified: Secondary | ICD-10-CM | POA: Diagnosis present

## 2022-08-02 DIAGNOSIS — K2100 Gastro-esophageal reflux disease with esophagitis, without bleeding: Secondary | ICD-10-CM | POA: Diagnosis present

## 2022-08-02 DIAGNOSIS — Z79899 Other long term (current) drug therapy: Secondary | ICD-10-CM | POA: Diagnosis not present

## 2022-08-02 DIAGNOSIS — G8929 Other chronic pain: Secondary | ICD-10-CM | POA: Diagnosis present

## 2022-08-02 DIAGNOSIS — F17210 Nicotine dependence, cigarettes, uncomplicated: Secondary | ICD-10-CM | POA: Diagnosis present

## 2022-08-02 LAB — HIV ANTIBODY (ROUTINE TESTING W REFLEX): HIV Screen 4th Generation wRfx: NONREACTIVE

## 2022-08-02 LAB — TROPONIN I (HIGH SENSITIVITY)
Troponin I (High Sensitivity): 173 ng/L (ref <18)
Troponin I (High Sensitivity): 184 ng/L (ref <18)

## 2022-08-02 LAB — IRON AND TIBC
Iron: 297 ug/dL — ABNORMAL HIGH (ref 45–182)
Saturation Ratios: 83 % — ABNORMAL HIGH (ref 17.9–39.5)
TIBC: 360 ug/dL (ref 250–450)
UIBC: 63 ug/dL

## 2022-08-02 LAB — BASIC METABOLIC PANEL
Anion gap: 7 (ref 5–15)
BUN: 13 mg/dL (ref 8–23)
CO2: 26 mmol/L (ref 22–32)
Calcium: 8.4 mg/dL — ABNORMAL LOW (ref 8.9–10.3)
Chloride: 107 mmol/L (ref 98–111)
Creatinine, Ser: 0.94 mg/dL (ref 0.61–1.24)
GFR, Estimated: >60 mL/min (ref >60)
Glucose, Bld: 90 mg/dL (ref 70–99)
Potassium: 3.9 mmol/L (ref 3.5–5.1)
Sodium: 140 mmol/L (ref 135–145)

## 2022-08-02 LAB — ECHOCARDIOGRAM COMPLETE
AR max vel: 3.11 cm2
AV Area VTI: 3.6 cm2
AV Area mean vel: 3.07 cm2
AV Mean grad: 3.7 mmHg
AV Peak grad: 6.6 mmHg
Ao pk vel: 1.28 m/s
Area-P 1/2: 4.89 cm2
Height: 75 in
S' Lateral: 4.4 cm
Weight: 2821.89 oz

## 2022-08-02 LAB — HEMOGLOBIN AND HEMATOCRIT, BLOOD
HCT: 27.5 % — ABNORMAL LOW (ref 39.0–52.0)
Hemoglobin: 7.5 g/dL — ABNORMAL LOW (ref 13.0–17.0)

## 2022-08-02 LAB — CBC
HCT: 24.8 % — ABNORMAL LOW (ref 39.0–52.0)
Hemoglobin: 7.1 g/dL — ABNORMAL LOW (ref 13.0–17.0)
MCH: 19.5 pg — ABNORMAL LOW (ref 26.0–34.0)
MCHC: 28.6 g/dL — ABNORMAL LOW (ref 30.0–36.0)
MCV: 68.1 fL — ABNORMAL LOW (ref 80.0–100.0)
Platelets: 349 10*3/uL (ref 150–400)
RBC: 3.64 MIL/uL — ABNORMAL LOW (ref 4.22–5.81)
RDW: 26.1 % — ABNORMAL HIGH (ref 11.5–15.5)
WBC: 14 10*3/uL — ABNORMAL HIGH (ref 4.0–10.5)
nRBC: 0.5 % — ABNORMAL HIGH (ref 0.0–0.2)

## 2022-08-02 LAB — FERRITIN: Ferritin: 2 ng/mL — ABNORMAL LOW (ref 24–336)

## 2022-08-02 LAB — PHOSPHORUS: Phosphorus: 4.2 mg/dL (ref 2.5–4.6)

## 2022-08-02 LAB — MAGNESIUM: Magnesium: 2.4 mg/dL (ref 1.7–2.4)

## 2022-08-02 MED ORDER — ALBUTEROL SULFATE (2.5 MG/3ML) 0.083% IN NEBU: 2.5000 mg | INHALATION_SOLUTION | RESPIRATORY_TRACT | Status: DC | PRN

## 2022-08-02 MED ORDER — PEG 3350-KCL-NA BICARB-NACL 420 G PO SOLR
4000.0000 mL | Freq: Once | ORAL | Status: AC
  Administered 2022-08-02: 4000 mL via ORAL

## 2022-08-02 MED ORDER — GABAPENTIN 400 MG PO CAPS
400.0000 mg | ORAL_CAPSULE | Freq: Three times a day (TID) | ORAL | Status: DC
  Administered 2022-08-02: 400 mg via ORAL
  Filled 2022-08-02 (×2): qty 1

## 2022-08-02 MED ORDER — QUETIAPINE FUMARATE 25 MG PO TABS
50.0000 mg | ORAL_TABLET | Freq: Every day | ORAL | Status: DC
  Administered 2022-08-02: 50 mg via ORAL
  Filled 2022-08-02: qty 2

## 2022-08-02 MED ORDER — ACETAMINOPHEN 325 MG PO TABS
650.0000 mg | ORAL_TABLET | Freq: Four times a day (QID) | ORAL | Status: DC | PRN
  Administered 2022-08-02 (×2): 650 mg via ORAL
  Filled 2022-08-02 (×2): qty 2

## 2022-08-02 MED ORDER — DEXTROSE IN LACTATED RINGERS 5 % IV SOLN: INTRAVENOUS | Status: DC

## 2022-08-02 NOTE — Progress Notes (Signed)
PROGRESS NOTE    Sean Thomas  URK:[id number] DOB: [birth year] DOA: 08/01/2022 PCP: Pcp, No   Brief Narrative:    Sean Thomas is a 62 y.o. male with medical history significant of COPD, GERD, tobacco abuse who presents to the emergency department due to chest pain.  Patient was admitted for evaluation of acute blood loss anemia and has received 3 units PRBC and remains on Protonix drip with GI consultation pending.  He is also noted to have elevated troponin in the setting of demand ischemia.  Assessment & Plan:   Principal Problem:   GI bleed Active Problems:   COPD (chronic obstructive pulmonary disease) (HCC)   Hyperglycemia   Iron deficiency anemia   GERD (gastroesophageal reflux disease)   Tobacco use disorder   Microcytic anemia   Acute blood loss anemia   Elevated troponin   Thrombocytosis  Assessment and Plan:   Acute GI bleed status post 3 unit PRBC Acute blood loss anemia H/H= 4.4/18.2, this was 12.8/41.7 on 02/27/2022 Hemoglobin increased to 7.1 this a.m. with stable vital signs Hemoccult was positive Type and crossmatch was done 3 units of PRBC was ordered to be transfused in the ED, follow H/H Continue IV Protonix drip Appreciate GI evaluation   Iron deficiency anemia/microcytic anemia Consider ferrous sulfate when patient resumes oral intake Iron studies will be done   Elevated troponin possibly secondary to type II demand ischemia, rule out ACS Troponin 125 > 138 > 173>184 Heparin cannot be started at this time due to patient's ongoing GI bleed Continue telemetry Patient currently has no chest pain  Due to upward troponin trend, check 2D echocardiogram  COPD Continue DuoNeb, Breo and Incruse Ellipta   GERD Continue Protonix drip    DVT prophylaxis:SCDs Code Status: Full Family Communication: None at bedside Disposition Plan:  Status is: Observation The patient will require care spanning > 2 midnights and should be moved to inpatient  because: Need for IV fluid   Consultants:  GI  Procedures:  None  Antimicrobials:  None   Subjective: Patient seen and evaluated today with no new acute complaints or concerns. No acute concerns or events noted overnight.  He denies any further chest pain.  He states he had dark stools for a period of 2 weeks at home shortly after being prescribed meloxicam for back pain.  Objective: Vitals:   08/02/22 0753 08/02/22 0800 08/02/22 0836 08/02/22 0927  BP: 116/77  (!) 158/70   Pulse: 89  78   Resp: 16  18   Temp:      TempSrc:      SpO2: 91%  96% 96%  Weight:      Height:  '6\' 3"'$  (1.905 m)      Intake/Output Summary (Last 24 hours) at 08/02/2022 0937 Last data filed at 08/02/2022 0507 Gross per 24 hour  Intake 1642.32 ml  Output --  Net 1642.32 ml   Filed Weights   08/01/22 1907  Weight: 80 kg    Examination:  General exam: Appears calm and comfortable  Respiratory system: Clear to auscultation. Respiratory effort normal. Cardiovascular system: S1 & S2 heard, RRR.  Gastrointestinal system: Abdomen is soft Central nervous system: Alert and awake Extremities: No edema Skin: No significant lesions noted Psychiatry: Flat affect.    Data Reviewed: I have personally reviewed following labs and imaging studies  CBC: Recent Labs  Lab 08/01/22 1945 08/02/22 0450  WBC 11.4* 14.0*  HGB 4.4* 7.1*  HCT 18.2* 24.8*  MCV 62.1*  68.1*  PLT 415* 203   Basic Metabolic Panel: Recent Labs  Lab 08/01/22 1945 08/02/22 0450  NA 136 140  K 4.3 3.9  CL 103 107  CO2 23 26  GLUCOSE 159* 90  BUN 13 13  CREATININE 1.02 0.94  CALCIUM 8.4* 8.4*  MG 2.2 2.4  PHOS  --  4.2   GFR: Estimated Creatinine Clearance: 91 mL/min (by C-G formula based on SCr of 0.94 mg/dL). Liver Function Tests: Recent Labs  Lab 08/01/22 1945  AST 17  ALT 13  ALKPHOS 43  BILITOT 0.2*  PROT 6.5  ALBUMIN 3.5   Recent Labs  Lab 08/01/22 1945  LIPASE 39   No results for input(s):  "AMMONIA" in the last 168 hours. Coagulation Profile: Recent Labs  Lab 08/01/22 1945  INR 1.0   Cardiac Enzymes: No results for input(s): "CKTOTAL", "CKMB", "CKMBINDEX", "TROPONINI" in the last 168 hours. BNP (last 3 results) No results for input(s): "PROBNP" in the last 8760 hours. HbA1C: No results for input(s): "HGBA1C" in the last 72 hours. CBG: No results for input(s): "GLUCAP" in the last 168 hours. Lipid Profile: No results for input(s): "CHOL", "HDL", "LDLCALC", "TRIG", "CHOLHDL", "LDLDIRECT" in the last 72 hours. Thyroid Function Tests: No results for input(s): "TSH", "T4TOTAL", "FREET4", "T3FREE", "THYROIDAB" in the last 72 hours. Anemia Panel: Recent Labs    08/02/22 0450  FERRITIN 2*  TIBC 360  IRON 297*   Sepsis Labs: No results for input(s): "PROCALCITON", "LATICACIDVEN" in the last 168 hours.  No results found for this or any previous visit (from the past 240 hour(s)).       Radiology Studies: DG Ankle 2 Views Right  Result Date: 08/01/2022 CLINICAL DATA:  Right ankle pain following injury 3 weeks ago. EXAM: RIGHT ANKLE - 2 VIEW COMPARISON:  None Available. FINDINGS: There is no evidence of fracture, dislocation, or joint effusion. Degenerative changes are noted in the midfoot and ankle. Mild calcaneal spurring. Mild soft tissue swelling is present over the lateral malleolus. Dystrophic calcifications are noted in the soft tissues. IMPRESSION: No acute fracture or dislocation. Electronically Signed   By: Brett Fairy M.D.   On: 08/01/2022 21:33   DG Chest 2 View  Result Date: 08/01/2022 CLINICAL DATA:  Shortness of breath.  History of COPD. EXAM: CHEST - 2 VIEW COMPARISON:  02/27/2022 FINDINGS: Mild cardiac enlargement. Bronchiectasis with peribronchial thickening and central interstitial changes likely chronic bronchitis. No airspace disease or consolidation in the lungs. No pleural effusions. No pneumothorax. Mediastinal contours appear intact. Multiple  old left rib fractures. Moderate-sized esophageal hiatal hernia. Similar appearance to previous study. IMPRESSION: Chronic bronchitic changes in the lungs. Old left rib fractures. No evidence of active pulmonary disease. Electronically Signed   By: Lucienne Capers M.D.   On: 08/01/2022 20:28        Scheduled Meds:  fluticasone furoate-vilanterol  1 puff Inhalation Daily   And   umeclidinium bromide  1 puff Inhalation Daily   [START ON 08/05/2022] pantoprazole  40 mg Intravenous Q12H   Continuous Infusions:  dextrose 5% lactated ringers 75 mL/hr at 08/02/22 0920   pantoprazole 8 mg/hr (08/02/22 0555)     LOS: 0 days    Time spent: 35 minutes    Bettylee Feig Darleen Crocker, DO [facility]  If 7PM-7AM, please contact night-coverage [email or web address] 08/02/2022, 9:37 AM

## 2022-08-02 NOTE — Progress Notes (Signed)
   08/02/22 0836  Vitals  BP (!) 158/70  MAP (mmHg) 96  BP Location Left Arm  BP Method Automatic  Patient Position (if appropriate) Sitting  Pulse Rate 78  Pulse Rate Source Monitor  Resp 18  MEWS COLOR  MEWS Score Color Green  Oxygen Therapy  SpO2 96 %  MEWS Score  MEWS Temp 0  MEWS Systolic 0  MEWS Pulse 0  MEWS RR 0  MEWS LOC 0  MEWS Score 0

## 2022-08-02 NOTE — Progress Notes (Signed)
Patient aggressive verbally at times, NP from GI in to see patient and went over plan of care with him and upgraded his diet to clear liquid , patient states he wanted a hot dog from " [employer]" he agreed to clear liquid diet and was given [employer] and sprite. Patient more calm at this time. Call bell and hydration within reach . Will continue to monitor ,. Possible colonoscopy tomorrow .

## 2022-08-02 NOTE — Consult Note (Signed)
Gastroenterology Consult   Referring Provider: No ref. provider found Primary Care Physician:  Pcp, No Primary Gastroenterologist:  Dr. Gala Romney   Patient ID: Sean Thomas; [id number]; 01/21/1961   Admit date: 08/01/2022  LOS: 0 days   Date of Consultation: 08/02/2022  Reason for Consultation:  anemia/heme positive stool   History of Present Illness   Sean Thomas is a 62 y.o. year old male with history of COPD, HTN, HLD, IDA, dysphagia who presented to the ED last night with chest pain, reportedly doing breathing treatments at home. Also with some nausea.   ED Course: Hgb 4.4, WBC 11.4 plt count 415k Iron 297, TIBC 360, saturation 83%, ferritin 2 (unreliable results as this was done after transfusion) Troponin 125, 138 on repeat FOBT positive  Hemodynamically stable, O2 low 90s on RA  Hgb improved to 7.1 this morning s/p 3 units PRBCs Troponin continues to rise, 184 this morning.. Protonix drip initiated   Consult:  Patient reports that he started having chest pain, would walk a few feet and give out. Reports when he smoked a cigarette he did fine, breathing treatments actually made his chest feel worse. Symptoms occurring over the past 4-5 days. Chest pain was worse with lying down to sleep. Denies SOB. He does endorse dizziness with activity. Somewhat more fatigue but not overtly worse than chronically.   He reports he has been taking a lot of aleve, used an entire bottle over the past week. Also reportedly doing BC powders 6-8 daily for the past 2-3 months. Had some previous nausea and vomiting on muscle relaxers but this resolved after he stopped those. Denies weight loss. No changes in appetite. He is hungry. Noted some BRB in the toilet a few weeks ago, unsure how many episodes he had. Denies any melena. Reports chronic back pain, though states he has some RLQ pain that radiates down his Right leg, difficulty walking.   He denies any history of anemia requiring blood  transfusions. Denies constipation or diarrhea. Having a BM daily.   GERD well controlled on PPI daily. Has some dysphagia with pills at times, no issues with pills.   Last Colonoscopy 2017: - Preparation of the colon was inadequate. - Diverticulosis in the entire examined colon. - One 5 mm polyp at the hepatic flexure-TA.  - One 3 mm polyp in the rectum-hyperplastic polyp Last EGD: 02/22/22- Moderate size hiatal hernia with Lysbeth Galas erosions -Schatzki's ring/peptic stricture with ulcer reflux esophagitis as described"status post Maloney dilation -Remainder of the stomach appeared normal. Patent pylorus. Normal first and second portion of the duodenum.   Recommend repeat Colonoscopy in 2022-lost to follow up   Past Medical History:  Diagnosis Date   Cervical pain 11/21/2012   COPD (chronic obstructive pulmonary disease) (HCC)    DDD (degenerative disc disease)    Esophageal dysphagia    Essential hypertension    Fracture of multiple ribs 11/01/2012   Fracture of occipital condyle (HCC) 11/21/2012   Headache    Hyperlipidemia    Inguinal hernia 12/19/2013   Microcytic anemia    N&V (nausea and vomiting) 05/23/2016    Past Surgical History:  Procedure Laterality Date   BACK SURGERY     COLONOSCOPY WITH PROPOFOL N/A 06/15/2016   RMR: inadequate bowel prep all mucosal surfaces not well seen. 5 mm tubular adenoma removed from the hepatic flexure. 3 mm polyp from the rectum was hyperplastic. He had scattered diverticula   ESOPHAGEAL BRUSHING  12/29/2019   Procedure: ESOPHAGEAL BRUSHING;  Surgeon: Daneil Dolin, MD;  [facility];  Service: Endoscopy;;   ESOPHAGOGASTRODUODENOSCOPY (EGD) WITH PROPOFOL N/A 06/15/2016   Dr. Gala Romney: Large hiatal hernia, reflux esophagitis, Schatzki ring with focal area of ulceration, status post dilation with 16F Maloney dilator with moderate improvement in luminal narrowing.   ESOPHAGOGASTRODUODENOSCOPY (EGD) WITH PROPOFOL N/A 12/29/2019    Surgeon: Daneil Dolin, MD; Candida esophagitis, Schatzki ring status post dilation, medium sized hiatal hernia with a single Cameron lesion.  KOH positive, treated with Diflucan for 21 days.   ESOPHAGOGASTRODUODENOSCOPY (EGD) WITH PROPOFOL N/A 02/22/2022   Procedure: ESOPHAGOGASTRODUODENOSCOPY (EGD) WITH PROPOFOL;  Surgeon: Daneil Dolin, MD;  [facility];  Service: Endoscopy;  Laterality: N/A;  1:00pm   INGUINAL HERNIA REPAIR Left 07/24/2014   Procedure: LAPAROSCOPIC LEFT INGUINAL HERNIA REPAIR ;  Surgeon: Ralene Ok, MD;  Location: [place];  Service: General;  Laterality: Left;   INGUINAL HERNIA REPAIR Right 08/17/2021   Procedure: HERNIA REPAIR INGUINAL ADULT W/ MESH;  Surgeon: Aviva Signs, MD;  Location: [facility];  Service: General;  Laterality: Right;   INSERTION OF MESH Left 07/24/2014   Procedure: INSERTION OF MESH;  Surgeon: Ralene Ok, MD;  Location: [place];  Service: General;  Laterality: Left;   [place] N/A 06/15/2016   Procedure: Venia Minks DILATION;  Surgeon: Daneil Dolin, MD;  [facility];  Service: Endoscopy;  Laterality: N/A;   MALONEY DILATION N/A 12/29/2019   Procedure: Venia Minks DILATION;  Surgeon: Daneil Dolin, MD;  [facility];  Service: Endoscopy;  Laterality: N/A;   MALONEY DILATION N/A 02/22/2022   Procedure: Venia Minks DILATION;  Surgeon: Daneil Dolin, MD;  [facility];  Service: Endoscopy;  Laterality: N/A;   POLYPECTOMY  06/15/2016   Procedure: POLYPECTOMY;  Surgeon: Daneil Dolin, MD;  [facility];  Service: Endoscopy;;  colon   right lung surgery Right     Prior to Admission medications   Medication Sig Start Date End Date Taking? Authorizing Provider  albuterol (PROAIR HFA) 108 (90 Base) MCG/ACT inhaler Inhale 2 puffs into the lungs every 4 (four) hours as needed for wheezing or shortness of breath. 03/01/22  Yes Martyn Ehrich, NP  Fluticasone-Umeclidin-Vilant  (TRELEGY ELLIPTA) 100-62.5-25 MCG/INH AEPB Inhale 1 puff into the lungs daily. 11/01/20  Yes Carmon Sails J, PA-C  gabapentin (NEURONTIN) 600 MG tablet Take 600 mg by mouth 3 (three) times daily. 03/14/21  Yes [provider]  ipratropium-albuterol (DUONEB) 0.5-2.5 (3) MG/3ML SOLN Take 3 mLs by nebulization every 6 (six) hours as needed. 03/02/22  Yes Martyn Ehrich, NP  methocarbamol (ROBAXIN) 500 MG tablet Take by mouth. 07/05/22  Yes [provider]  pantoprazole (PROTONIX) 40 MG tablet Take 1 tablet (40 mg total) by mouth 2 (two) times daily before a meal. 02/22/22  Yes Rourk, Cristopher Estimable, MD  predniSONE (DELTASONE) 20 MG tablet Take 2 tablets (40 mg total) by mouth daily with breakfast. Patient taking differently: Take 10 mg by mouth daily with breakfast. 03/01/22  Yes Martyn Ehrich, NP  QUEtiapine (SEROQUEL) 50 MG tablet Take 50 mg by mouth at bedtime.   Yes [provider]  Fluticasone-Umeclidin-Vilant (TRELEGY ELLIPTA) 100-62.5-25 MCG/ACT AEPB Inhale 28 each into the lungs daily. Patient not taking: Reported on 08/01/2022 03/01/22   Martyn Ehrich, NP    Current Facility-Administered Medications  Medication Dose Route Frequency Provider Last Rate Last Admin   acetaminophen (  TYLENOL) tablet 650 mg  650 mg Oral Q6H PRN Manuella Ghazi, Pratik D, DO       albuterol (PROVENTIL) (2.5 MG/3ML) 0.083% nebulizer solution 2.5 mg  2.5 mg Nebulization Q2H PRN Manuella Ghazi, Pratik D, DO       dextrose 5 % in lactated ringers infusion   Intravenous Continuous Heath Lark D, DO 75 mL/hr at 08/02/22 0920 New Bag at 08/02/22 0920   fluticasone furoate-vilanterol (BREO ELLIPTA) 100-25 MCG/ACT 1 puff  1 puff Inhalation Daily Adefeso, Oladapo, DO       And   umeclidinium bromide (INCRUSE ELLIPTA) 62.5 MCG/ACT 1 puff  1 puff Inhalation Daily Adefeso, Oladapo, DO       ipratropium-albuterol (DUONEB) 0.5-2.5 (3) MG/3ML nebulizer solution 3 mL  3 mL Nebulization Q6H PRN Adefeso, Oladapo, DO        [START ON 08/05/2022] pantoprazole (PROTONIX) injection 40 mg  40 mg Intravenous Q12H Adefeso, Oladapo, DO       pantoprozole (PROTONIX) 80 mg /NS 100 mL infusion  8 mg/hr Intravenous Continuous Adefeso, Oladapo, DO 10 mL/hr at 08/02/22 0555 8 mg/hr at 08/02/22 0555    Allergies as of 08/01/2022 - Review Complete 08/01/2022  Allergen Reaction Noted   Advil [ibuprofen] Diarrhea 07/20/2014   Aleve [naproxen sodium] Nausea And Vomiting 04/23/2011    Family History  Problem Relation Age of Onset   Diabetes Mother    Alzheimer's disease Mother    Diabetes Sister    Colon cancer Neg Hx    Liver disease Neg Hx     Social History   Socioeconomic History   Marital status: Single    Spouse name: Not on file   Number of children: Not on file   Years of education: Not on file   Highest education level: Not on file  Occupational History   Not on file  Tobacco Use   Smoking status: Every Day    Packs/day: 0.25    Years: 45.00    Total pack years: 11.25    Types: Cigarettes   Smokeless tobacco: Never  Vaping Use   Vaping Use: Never used  Substance and Sexual Activity   Alcohol use: Yes    Alcohol/week: 9.0 standard drinks of alcohol    Types: 9 Cans of beer per week    Comment: occ beer   Drug use: Yes    Types: Marijuana    Comment: last marijuana was 1 month ago   Sexual activity: Not on file  Other Topics Concern   Not on file  Social History Narrative   Not on file   Social Determinants of Health   Financial Resource Strain: Not on file  Food Insecurity: Not on file  Transportation Needs: Not on file  Physical Activity: Not on file  Stress: Not on file  Social Connections: Not on file  Intimate Partner Violence: Not on file    Review of Systems   Gen: Denies any fever, chills, loss of appetite, change in weight or weight loss CV: Denies chest pain, heart palpitations, syncope, edema  Resp: Denies shortness of breath with rest, cough, wheezing, coughing up blood,  and pleurisy. CW:UGQBVQ melena, nausea, vomiting, diarrhea, constipation, odyonophagia, early satiety or weight loss. +hematochezia +occasional pill dysphagia  GU : Denies urinary burning, blood in urine, urinary frequency, and urinary incontinence. MS: Denies joint pain, limitation of movement, swelling, cramps, and atrophy.  Derm: Denies rash, itching, dry skin, hives. Psych: Denies depression, anxiety, memory loss, hallucinations, and confusion. Heme: Denies bruising or  bleeding Neuro:  Denies any headaches, dizziness, paresthesias, shaking  Physical Exam   Vital Signs in last 24 hours: Temp:  [97.7 F (36.5 C)-98.1 F (36.7 C)] 97.9 F (36.6 C) (01/17 0300) Pulse Rate:  [45-122] 78 (01/17 0836) Resp:  [14-25] 18 (01/17 0836) BP: (90-158)/(49-87) 158/70 (01/17 0836) SpO2:  [89 %-98 %] 96 % (01/17 0836) Weight:  [80 kg] 80 kg (01/16 1907) Last BM Date : 07/31/22  General:   Alert,  Well-developed, well-nourished, pleasant and cooperative in NAD Head:  Normocephalic and atraumatic. Eyes:  Sclera clear, no icterus.   Conjunctiva pink. Ears:  Normal auditory acuity. Mouth:  No deformity or lesions, dentition normal. Neck:  Supple; no masses Lungs:  Clear throughout to auscultation.   No wheezes, crackles, or rhonchi. No acute distress. Heart:  Regular rate and rhythm; no murmurs, clicks, rubs,  or gallops. Abdomen:  Soft, nontender and nondistended. No masses, hepatosplenomegaly or hernias noted. Normal bowel sounds, without guarding, and without rebound.   Msk:  Symmetrical without gross deformities. Normal posture. Extremities:  Without clubbing or edema. Neurologic:  Alert and  oriented x4. Skin:  Intact without significant lesions or rashes. Psych:  Alert and cooperative. Normal mood and affect.  Intake/Output from previous day: 01/16 0701 - 01/17 0700 In: 1642.3 [I.V.:282.3; Blood:1260; IV Piggyback:100] Out: -    Labs/Studies   Recent Labs Recent Labs     08/01/22 1945 08/02/22 0450  WBC 11.4* 14.0*  HGB 4.4* 7.1*  HCT 18.2* 24.8*  PLT 415* 349   BMET Recent Labs    08/01/22 1945 08/02/22 0450  NA 136 140  K 4.3 3.9  CL 103 107  CO2 23 26  GLUCOSE 159* 90  BUN 13 13  CREATININE 1.02 0.94  CALCIUM 8.4* 8.4*   LFT Recent Labs    08/01/22 1945  PROT 6.5  ALBUMIN 3.5  AST 17  ALT 13  ALKPHOS 43  BILITOT 0.2*  BILIDIR <0.1  IBILI NOT CALCULATED   PT/INR Recent Labs    08/01/22 1945  LABPROT 13.5  INR 1.0   Radiology/Studies DG Ankle 2 Views Right  Result Date: 08/01/2022 CLINICAL DATA:  Right ankle pain following injury 3 weeks ago. EXAM: RIGHT ANKLE - 2 VIEW COMPARISON:  None Available. FINDINGS: There is no evidence of fracture, dislocation, or joint effusion. Degenerative changes are noted in the midfoot and ankle. Mild calcaneal spurring. Mild soft tissue swelling is present over the lateral malleolus. Dystrophic calcifications are noted in the soft tissues. IMPRESSION: No acute fracture or dislocation. Electronically Signed   By: Brett Fairy M.D.   On: 08/01/2022 21:33   DG Chest 2 View  Result Date: 08/01/2022 CLINICAL DATA:  Shortness of breath.  History of COPD. EXAM: CHEST - 2 VIEW COMPARISON:  02/27/2022 FINDINGS: Mild cardiac enlargement. Bronchiectasis with peribronchial thickening and central interstitial changes likely chronic bronchitis. No airspace disease or consolidation in the lungs. No pleural effusions. No pneumothorax. Mediastinal contours appear intact. Multiple old left rib fractures. Moderate-sized esophageal hiatal hernia. Similar appearance to previous study. IMPRESSION: Chronic bronchitic changes in the lungs. Old left rib fractures. No evidence of active pulmonary disease. Electronically Signed   By: Lucienne Capers M.D.   On: 08/01/2022 20:28     Assessment   Sean Thomas is a 62 y.o. year old male with history of COPD, HTN, HLD, IDA, dysphagia who presented to the ED last night with  chest pain and nausea, found to have significantly low hgb  of 4.4, down from 12.8 in August. Heme positive stool, GI consulted for further evaluation  Anemia and heme positive stool: hgb 4.4 on admission, baseline 12-13 range. Iron studies with iron 297, saturation 83, ferritin very low at 2 those these were drawn after blood transfusions so cannot rely on the accuracy of these. He has had some intermittent rectal bleeding, last colonoscopy in 2017, poor prep, tubular adenomas, recommended repeat in 5 years but appears he was lost to follow up for this. He reportedly is taking heavy doses of aleve and BCs, no real UGI symptoms but abuse of NSAIDs certainly puts him at risk for UGI bleeding. Recommend proceeding with both EGD and Colonoscopy given rectal bleeding and NSAID use as we cannot rule out bleeding polyps, AVMs, PUD, gastritis, duodenitis, malignancy.   Indications, risks and benefits of procedure discussed in detail with patient. Patient verbalized understanding and is in agreement to proceed with EGD/colonoscopy tomorrow.    Plan / Recommendations   PPI BID Trend h&h, transfuse for hgb <7 Colonoscopy/EGD tomorrow  4. NPO Midnight  5. Clear liquids today  6. Avoid NSAIDs     08/02/2022, 9:21 AM  [place] Bryngelson L. Alver Sorrow, MSN, APRN, AGNP-C Adult-Gerontology Nurse Practitioner [facility] at [facility]

## 2022-08-02 NOTE — Progress Notes (Signed)
  Transition of Care (TOC) Screening Note   Patient Details  Name: Sean Thomas Date of Birth: Dec 06, 1960   Transition of Care [facility]) CM/SW Contact:    Iona Beard, [place] Phone Number: 08/02/2022, 10:29 AM    Transition of Care Department [facility]) has reviewed patient and no TOC needs have been identified at this time. We will continue to monitor patient advancement through interdisciplinary progression rounds. If new patient transition needs arise, please place a TOC consult.

## 2022-08-02 NOTE — H&P (View-Only) (Signed)
Gastroenterology Consult   Referring Provider: No ref. provider found Primary Care Physician:  Pcp, No Primary Gastroenterologist:  Dr. Gala Romney   Patient ID: Sean Thomas; [id number]; [birth year]   Admit date: 08/01/2022  LOS: 0 days   Date of Consultation: 08/02/2022  Reason for Consultation:  anemia/heme positive stool   History of Present Illness   Sean Thomas is a 62 y.o. year old male with history of COPD, HTN, HLD, IDA, dysphagia who presented to the ED last night with chest pain, reportedly doing breathing treatments at home. Also with some nausea.   ED Course: Hgb 4.4, WBC 11.4 plt count 415k Iron 297, TIBC 360, saturation 83%, ferritin 2 (unreliable results as this was done after transfusion) Troponin 125, 138 on repeat FOBT positive  Hemodynamically stable, O2 low 90s on RA  Hgb improved to 7.1 this morning s/p 3 units PRBCs Troponin continues to rise, 184 this morning.. Protonix drip initiated   Consult:  Patient reports that he started having chest pain, would walk a few feet and give out. Reports when he smoked a cigarette he did fine, breathing treatments actually made his chest feel worse. Symptoms occurring over the past 4-5 days. Chest pain was worse with lying down to sleep. Denies SOB. He does endorse dizziness with activity. Somewhat more fatigue but not overtly worse than chronically.   He reports he has been taking a lot of aleve, used an entire bottle over the past week. Also reportedly doing BC powders 6-8 daily for the past 2-3 months. Had some previous nausea and vomiting on muscle relaxers but this resolved after he stopped those. Denies weight loss. No changes in appetite. He is hungry. Noted some BRB in the toilet a few weeks ago, unsure how many episodes he had. Denies any melena. Reports chronic back pain, though states he has some RLQ pain that radiates down his Right leg, difficulty walking.   He denies any history of anemia requiring blood  transfusions. Denies constipation or diarrhea. Having a BM daily.   GERD well controlled on PPI daily. Has some dysphagia with pills at times, no issues with pills.   Last Colonoscopy 2017: - Preparation of the colon was inadequate. - Diverticulosis in the entire examined colon. - One 5 mm polyp at the hepatic flexure-TA.  - One 3 mm polyp in the rectum-hyperplastic polyp Last EGD: 02/22/22- Moderate size hiatal hernia with Sean Thomas erosions -Schatzki's ring/peptic stricture with ulcer reflux esophagitis as described"status post Maloney dilation -Remainder of the stomach appeared normal. Patent pylorus. Normal first and second portion of the duodenum.   Recommend repeat Colonoscopy in 2022-lost to follow up   Past Medical History:  Diagnosis Date   Cervical pain 11/21/2012   COPD (chronic obstructive pulmonary disease) (HCC)    DDD (degenerative disc disease)    Esophageal dysphagia    Essential hypertension    Fracture of multiple ribs 11/01/2012   Fracture of occipital condyle (HCC) 11/21/2012   Headache    Hyperlipidemia    Inguinal hernia 12/19/2013   Microcytic anemia    N&V (nausea and vomiting) 05/23/2016    Past Surgical History:  Procedure Laterality Date   BACK SURGERY     COLONOSCOPY WITH PROPOFOL N/A 06/15/2016   RMR: inadequate bowel prep all mucosal surfaces not well seen. 5 mm tubular adenoma removed from the hepatic flexure. 3 mm polyp from the rectum was hyperplastic. He had scattered diverticula   ESOPHAGEAL BRUSHING  12/29/2019   Procedure: ESOPHAGEAL BRUSHING;  Surgeon: Daneil Dolin, MD;  [facility];  Service: Endoscopy;;   ESOPHAGOGASTRODUODENOSCOPY (EGD) WITH PROPOFOL N/A 06/15/2016   Dr. Gala Romney: Large hiatal hernia, reflux esophagitis, Schatzki ring with focal area of ulceration, status post dilation with 79F Maloney dilator with moderate improvement in luminal narrowing.   ESOPHAGOGASTRODUODENOSCOPY (EGD) WITH PROPOFOL N/A 12/29/2019    Surgeon: Daneil Dolin, MD; Candida esophagitis, Schatzki ring status post dilation, medium sized hiatal hernia with a single Cameron lesion.  KOH positive, treated with Diflucan for 21 days.   ESOPHAGOGASTRODUODENOSCOPY (EGD) WITH PROPOFOL N/A 02/22/2022   Procedure: ESOPHAGOGASTRODUODENOSCOPY (EGD) WITH PROPOFOL;  Surgeon: Daneil Dolin, MD;  [facility];  Service: Endoscopy;  Laterality: N/A;  1:00pm   INGUINAL HERNIA REPAIR Left 07/24/2014   Procedure: LAPAROSCOPIC LEFT INGUINAL HERNIA REPAIR ;  Surgeon: Ralene Ok, MD;  Location: [place];  Service: General;  Laterality: Left;   INGUINAL HERNIA REPAIR Right 08/17/2021   Procedure: HERNIA REPAIR INGUINAL ADULT W/ MESH;  Surgeon: Aviva Signs, MD;  Location: [facility];  Service: General;  Laterality: Right;   INSERTION OF MESH Left 07/24/2014   Procedure: INSERTION OF MESH;  Surgeon: Ralene Ok, MD;  Location: [place];  Service: General;  Laterality: Left;   [employer] N/A 06/15/2016   Procedure: Venia Minks DILATION;  Surgeon: Daneil Dolin, MD;  [facility];  Service: Endoscopy;  Laterality: N/A;   MALONEY DILATION N/A 12/29/2019   Procedure: Venia Minks DILATION;  Surgeon: Daneil Dolin, MD;  [facility];  Service: Endoscopy;  Laterality: N/A;   MALONEY DILATION N/A 02/22/2022   Procedure: Venia Minks DILATION;  Surgeon: Daneil Dolin, MD;  [facility];  Service: Endoscopy;  Laterality: N/A;   POLYPECTOMY  06/15/2016   Procedure: POLYPECTOMY;  Surgeon: Daneil Dolin, MD;  [facility];  Service: Endoscopy;;  colon   right lung surgery Right     Prior to Admission medications   Medication Sig Start Date End Date Taking? Authorizing Provider  albuterol (PROAIR HFA) 108 (90 Base) MCG/ACT inhaler Inhale 2 puffs into the lungs every 4 (four) hours as needed for wheezing or shortness of breath. 03/01/22  Yes Martyn Ehrich, NP  Fluticasone-Umeclidin-Vilant  (TRELEGY ELLIPTA) 100-62.5-25 MCG/INH AEPB Inhale 1 puff into the lungs daily. 11/01/20  Yes Carmon Sails J, PA-C  gabapentin (NEURONTIN) 600 MG tablet Take 600 mg by mouth 3 (three) times daily. 03/14/21  Yes [provider]  ipratropium-albuterol (DUONEB) 0.5-2.5 (3) MG/3ML SOLN Take 3 mLs by nebulization every 6 (six) hours as needed. 03/02/22  Yes Martyn Ehrich, NP  methocarbamol (ROBAXIN) 500 MG tablet Take by mouth. 07/05/22  Yes [provider]  pantoprazole (PROTONIX) 40 MG tablet Take 1 tablet (40 mg total) by mouth 2 (two) times daily before a meal. 02/22/22  Yes Rourk, Cristopher Estimable, MD  predniSONE (DELTASONE) 20 MG tablet Take 2 tablets (40 mg total) by mouth daily with breakfast. Patient taking differently: Take 10 mg by mouth daily with breakfast. 03/01/22  Yes Martyn Ehrich, NP  QUEtiapine (SEROQUEL) 50 MG tablet Take 50 mg by mouth at bedtime.   Yes [provider]  Fluticasone-Umeclidin-Vilant (TRELEGY ELLIPTA) 100-62.5-25 MCG/ACT AEPB Inhale 28 each into the lungs daily. Patient not taking: Reported on 08/01/2022 03/01/22   Martyn Ehrich, NP    Current Facility-Administered Medications  Medication Dose Route Frequency Provider Last Rate Last Admin   acetaminophen (  TYLENOL) tablet 650 mg  650 mg Oral Q6H PRN Manuella Ghazi, Pratik D, DO       albuterol (PROVENTIL) (2.5 MG/3ML) 0.083% nebulizer solution 2.5 mg  2.5 mg Nebulization Q2H PRN Manuella Ghazi, Pratik D, DO       dextrose 5 % in lactated ringers infusion   Intravenous Continuous Heath Lark D, DO 75 mL/hr at 08/02/22 0920 New Bag at 08/02/22 0920   fluticasone furoate-vilanterol (BREO ELLIPTA) 100-25 MCG/ACT 1 puff  1 puff Inhalation Daily Adefeso, Oladapo, DO       And   umeclidinium bromide (INCRUSE ELLIPTA) 62.5 MCG/ACT 1 puff  1 puff Inhalation Daily Adefeso, Oladapo, DO       ipratropium-albuterol (DUONEB) 0.5-2.5 (3) MG/3ML nebulizer solution 3 mL  3 mL Nebulization Q6H PRN Adefeso, Oladapo, DO        [START ON 08/05/2022] pantoprazole (PROTONIX) injection 40 mg  40 mg Intravenous Q12H Adefeso, Oladapo, DO       pantoprozole (PROTONIX) 80 mg /NS 100 mL infusion  8 mg/hr Intravenous Continuous Adefeso, Oladapo, DO 10 mL/hr at 08/02/22 0555 8 mg/hr at 08/02/22 0555    Allergies as of 08/01/2022 - Review Complete 08/01/2022  Allergen Reaction Noted   Advil [ibuprofen] Diarrhea 07/20/2014   Aleve [naproxen sodium] Nausea And Vomiting 04/23/2011    Family History  Problem Relation Age of Onset   Diabetes Mother    Alzheimer's disease Mother    Diabetes Sister    Colon cancer Neg Hx    Liver disease Neg Hx     Social History   Socioeconomic History   Marital status: Single    Spouse name: Not on file   Number of children: Not on file   Years of education: Not on file   Highest education level: Not on file  Occupational History   Not on file  Tobacco Use   Smoking status: Every Day    Packs/day: 0.25    Years: 45.00    Total pack years: 11.25    Types: Cigarettes   Smokeless tobacco: Never  Vaping Use   Vaping Use: Never used  Substance and Sexual Activity   Alcohol use: Yes    Alcohol/week: 9.0 standard drinks of alcohol    Types: 9 Cans of beer per week    Comment: occ beer   Drug use: Yes    Types: Marijuana    Comment: last marijuana was 1 month ago   Sexual activity: Not on file  Other Topics Concern   Not on file  Social History Narrative   Not on file   Social Determinants of Health   Financial Resource Strain: Not on file  Food Insecurity: Not on file  Transportation Needs: Not on file  Physical Activity: Not on file  Stress: Not on file  Social Connections: Not on file  Intimate Partner Violence: Not on file    Review of Systems   Gen: Denies any fever, chills, loss of appetite, change in weight or weight loss CV: Denies chest pain, heart palpitations, syncope, edema  Resp: Denies shortness of breath with rest, cough, wheezing, coughing up blood,  and pleurisy. KZ:SWFUXN melena, nausea, vomiting, diarrhea, constipation, odyonophagia, early satiety or weight loss. +hematochezia +occasional pill dysphagia  GU : Denies urinary burning, blood in urine, urinary frequency, and urinary incontinence. MS: Denies joint pain, limitation of movement, swelling, cramps, and atrophy.  Derm: Denies rash, itching, dry skin, hives. Psych: Denies depression, anxiety, memory loss, hallucinations, and confusion. Heme: Denies bruising or  bleeding Neuro:  Denies any headaches, dizziness, paresthesias, shaking  Physical Exam   Vital Signs in last 24 hours: Temp:  [97.7 F (36.5 C)-98.1 F (36.7 C)] 97.9 F (36.6 C) (01/17 0300) Pulse Rate:  [45-122] 78 (01/17 0836) Resp:  [14-25] 18 (01/17 0836) BP: (90-158)/(49-87) 158/70 (01/17 0836) SpO2:  [89 %-98 %] 96 % (01/17 0836) Weight:  [80 kg] 80 kg (01/16 1907) Last BM Date : 07/31/22  General:   Alert,  Well-developed, well-nourished, pleasant and cooperative in NAD Head:  Normocephalic and atraumatic. Eyes:  Sclera clear, no icterus.   Conjunctiva pink. Ears:  Normal auditory acuity. Mouth:  No deformity or lesions, dentition normal. Neck:  Supple; no masses Lungs:  Clear throughout to auscultation.   No wheezes, crackles, or rhonchi. No acute distress. Heart:  Regular rate and rhythm; no murmurs, clicks, rubs,  or gallops. Abdomen:  Soft, nontender and nondistended. No masses, hepatosplenomegaly or hernias noted. Normal bowel sounds, without guarding, and without rebound.   Msk:  Symmetrical without gross deformities. Normal posture. Extremities:  Without clubbing or edema. Neurologic:  Alert and  oriented x4. Skin:  Intact without significant lesions or rashes. Psych:  Alert and cooperative. Normal mood and affect.  Intake/Output from previous day: 01/16 0701 - 01/17 0700 In: 1642.3 [I.V.:282.3; Blood:1260; IV Piggyback:100] Out: -    Labs/Studies   Recent Labs Recent Labs     08/01/22 1945 08/02/22 0450  WBC 11.4* 14.0*  HGB 4.4* 7.1*  HCT 18.2* 24.8*  PLT 415* 349   BMET Recent Labs    08/01/22 1945 08/02/22 0450  NA 136 140  K 4.3 3.9  CL 103 107  CO2 23 26  GLUCOSE 159* 90  BUN 13 13  CREATININE 1.02 0.94  CALCIUM 8.4* 8.4*   LFT Recent Labs    08/01/22 1945  PROT 6.5  ALBUMIN 3.5  AST 17  ALT 13  ALKPHOS 43  BILITOT 0.2*  BILIDIR <0.1  IBILI NOT CALCULATED   PT/INR Recent Labs    08/01/22 1945  LABPROT 13.5  INR 1.0   Radiology/Studies DG Ankle 2 Views Right  Result Date: 08/01/2022 CLINICAL DATA:  Right ankle pain following injury 3 weeks ago. EXAM: RIGHT ANKLE - 2 VIEW COMPARISON:  None Available. FINDINGS: There is no evidence of fracture, dislocation, or joint effusion. Degenerative changes are noted in the midfoot and ankle. Mild calcaneal spurring. Mild soft tissue swelling is present over the lateral malleolus. Dystrophic calcifications are noted in the soft tissues. IMPRESSION: No acute fracture or dislocation. Electronically Signed   By: Brett Fairy M.D.   On: 08/01/2022 21:33   DG Chest 2 View  Result Date: 08/01/2022 CLINICAL DATA:  Shortness of breath.  History of COPD. EXAM: CHEST - 2 VIEW COMPARISON:  02/27/2022 FINDINGS: Mild cardiac enlargement. Bronchiectasis with peribronchial thickening and central interstitial changes likely chronic bronchitis. No airspace disease or consolidation in the lungs. No pleural effusions. No pneumothorax. Mediastinal contours appear intact. Multiple old left rib fractures. Moderate-sized esophageal hiatal hernia. Similar appearance to previous study. IMPRESSION: Chronic bronchitic changes in the lungs. Old left rib fractures. No evidence of active pulmonary disease. Electronically Signed   By: Lucienne Capers M.D.   On: 08/01/2022 20:28     Assessment   Sean Thomas is a 62 y.o. year old male with history of COPD, HTN, HLD, IDA, dysphagia who presented to the ED last night with  chest pain and nausea, found to have significantly low hgb  of 4.4, down from 12.8 in August. Heme positive stool, GI consulted for further evaluation  Anemia and heme positive stool: hgb 4.4 on admission, baseline 12-13 range. Iron studies with iron 297, saturation 83, ferritin very low at 2 those these were drawn after blood transfusions so cannot rely on the accuracy of these. He has had some intermittent rectal bleeding, last colonoscopy in 2017, poor prep, tubular adenomas, recommended repeat in 5 years but appears he was lost to follow up for this. He reportedly is taking heavy doses of aleve and BCs, no real UGI symptoms but abuse of NSAIDs certainly puts him at risk for UGI bleeding. Recommend proceeding with both EGD and Colonoscopy given rectal bleeding and NSAID use as we cannot rule out bleeding polyps, AVMs, PUD, gastritis, duodenitis, malignancy.   Indications, risks and benefits of procedure discussed in detail with patient. Patient verbalized understanding and is in agreement to proceed with EGD/colonoscopy tomorrow.    Plan / Recommendations   PPI BID Trend h&h, transfuse for hgb <7 Colonoscopy/EGD tomorrow  4. NPO Midnight  5. Clear liquids today  6. Avoid NSAIDs     08/02/2022, 9:21 AM  Derriona Branscom L. Alver Sorrow, MSN, APRN, AGNP-C Adult-Gerontology Nurse Practitioner [facility] [facility]

## 2022-08-02 NOTE — Progress Notes (Signed)
  Echocardiogram 2D Echocardiogram has been performed.  Sean Thomas 08/02/2022, 3:02 PM

## 2022-08-03 ENCOUNTER — Encounter (HOSPITAL_COMMUNITY): Payer: Self-pay | Admitting: Internal Medicine

## 2022-08-03 ENCOUNTER — Encounter (HOSPITAL_COMMUNITY)
Admission: EM | Disposition: A | Discharge status: Home / Self Care | Payer: Self-pay | Source: Home / Self Care | Attending: Internal Medicine

## 2022-08-03 ENCOUNTER — Inpatient Hospital Stay (HOSPITAL_COMMUNITY): Payer: Medicaid Other | Admitting: Certified Registered Nurse Anesthetist

## 2022-08-03 ENCOUNTER — Telehealth: Payer: Self-pay | Admitting: Internal Medicine

## 2022-08-03 DIAGNOSIS — B3781 Candidal esophagitis: Secondary | ICD-10-CM | POA: Diagnosis not present

## 2022-08-03 DIAGNOSIS — K2210 Ulcer of esophagus without bleeding: Secondary | ICD-10-CM

## 2022-08-03 DIAGNOSIS — D62 Acute posthemorrhagic anemia: Secondary | ICD-10-CM | POA: Diagnosis not present

## 2022-08-03 DIAGNOSIS — K449 Diaphragmatic hernia without obstruction or gangrene: Secondary | ICD-10-CM | POA: Diagnosis not present

## 2022-08-03 DIAGNOSIS — K922 Gastrointestinal hemorrhage, unspecified: Secondary | ICD-10-CM | POA: Diagnosis not present

## 2022-08-03 HISTORY — PX: ESOPHAGOGASTRODUODENOSCOPY: SHX5428

## 2022-08-03 HISTORY — PX: COLONOSCOPY WITH PROPOFOL: SHX5780

## 2022-08-03 LAB — BPAM RBC
Blood Product Expiration Date: 202401172359
Blood Product Expiration Date: 202401182359
Blood Product Expiration Date: 202401182359
ISSUE DATE / TIME: 202401162204
ISSUE DATE / TIME: 202401162315
ISSUE DATE / TIME: 202401170038
Unit Type and Rh: 9500
Unit Type and Rh: 9500
Unit Type and Rh: 9500

## 2022-08-03 LAB — TYPE AND SCREEN
ABO/RH(D): A POS
Antibody Screen: NEGATIVE
Unit division: 0
Unit division: 0
Unit division: 0

## 2022-08-03 LAB — BASIC METABOLIC PANEL
Anion gap: 5 (ref 5–15)
BUN: 9 mg/dL (ref 8–23)
CO2: 28 mmol/L (ref 22–32)
Calcium: 8.1 mg/dL — ABNORMAL LOW (ref 8.9–10.3)
Chloride: 108 mmol/L (ref 98–111)
Creatinine, Ser: 0.93 mg/dL (ref 0.61–1.24)
GFR, Estimated: >60 mL/min (ref >60)
Glucose, Bld: 98 mg/dL (ref 70–99)
Potassium: 3.9 mmol/L (ref 3.5–5.1)
Sodium: 141 mmol/L (ref 135–145)

## 2022-08-03 LAB — MAGNESIUM: Magnesium: 2.4 mg/dL (ref 1.7–2.4)

## 2022-08-03 LAB — CBC
HCT: 26 % — ABNORMAL LOW (ref 39.0–52.0)
Hemoglobin: 7.4 g/dL — ABNORMAL LOW (ref 13.0–17.0)
MCH: 19.8 pg — ABNORMAL LOW (ref 26.0–34.0)
MCHC: 28.5 g/dL — ABNORMAL LOW (ref 30.0–36.0)
MCV: 69.5 fL — ABNORMAL LOW (ref 80.0–100.0)
Platelets: 381 10*3/uL (ref 150–400)
RBC: 3.74 MIL/uL — ABNORMAL LOW (ref 4.22–5.81)
RDW: 25.9 % — ABNORMAL HIGH (ref 11.5–15.5)
WBC: 13.5 10*3/uL — ABNORMAL HIGH (ref 4.0–10.5)
nRBC: 0.3 % — ABNORMAL HIGH (ref 0.0–0.2)

## 2022-08-03 LAB — KOH PREP

## 2022-08-03 SURGERY — COLONOSCOPY WITH PROPOFOL
Anesthesia: General

## 2022-08-03 MED ORDER — LACTATED RINGERS IV SOLN: INTRAVENOUS | Status: DC | PRN

## 2022-08-03 MED ORDER — LIDOCAINE 2% (20 MG/ML) 5 ML SYRINGE
INTRAMUSCULAR | Status: DC | PRN
  Administered 2022-08-03: 50 mg via INTRAVENOUS

## 2022-08-03 MED ORDER — HYDROCODONE-ACETAMINOPHEN 5-325 MG PO TABS: 1.0000 | ORAL_TABLET | ORAL | 0 refills | Status: DC | PRN

## 2022-08-03 MED ORDER — PROPOFOL 10 MG/ML IV BOLUS
INTRAVENOUS | Status: DC | PRN
  Administered 2022-08-03: 50 mg via INTRAVENOUS

## 2022-08-03 MED ORDER — PROPOFOL 500 MG/50ML IV EMUL
INTRAVENOUS | Status: DC | PRN
  Administered 2022-08-03: 175 ug/kg/min via INTRAVENOUS

## 2022-08-03 MED ORDER — HYDROMORPHONE HCL 1 MG/ML IJ SOLN
0.5000 mg | INTRAMUSCULAR | Status: DC | PRN
  Administered 2022-08-03: 0.5 mg via INTRAVENOUS
  Filled 2022-08-03: qty 0.5

## 2022-08-03 MED ORDER — PANTOPRAZOLE SODIUM 40 MG PO TBEC: 40.0000 mg | DELAYED_RELEASE_TABLET | Freq: Two times a day (BID) | ORAL | 11 refills | Status: DC

## 2022-08-03 NOTE — Anesthesia Postprocedure Evaluation (Signed)
Anesthesia Post Note  Patient: Sean Thomas  Procedure(s) Performed: COLONOSCOPY WITH PROPOFOL ESOPHAGOGASTRODUODENOSCOPY (EGD)  Patient location during evaluation: Endoscopy Anesthesia Type: General Level of consciousness: awake and alert Pain management: pain level controlled Vital Signs Assessment: post-procedure vital signs reviewed and stable Respiratory status: spontaneous breathing, nonlabored ventilation, respiratory function stable and patient connected to nasal cannula oxygen Cardiovascular status: blood pressure returned to baseline and stable Postop Assessment: no apparent nausea or vomiting Anesthetic complications: no   There were no known notable events for this encounter.   Last Vitals:  Vitals:   08/03/22 1145 08/03/22 1248  BP:  (!) 92/57  Pulse: 78 73  Resp: (!) 26 18  Temp:  36.6 C  SpO2: 96% 97%    Last Pain:  Vitals:   08/03/22 1248  TempSrc:   PainSc: 0-No pain                 Trixie Rude

## 2022-08-03 NOTE — Transfer of Care (Signed)
Immediate Anesthesia Transfer of Care Note  Patient: Sean Thomas  Procedure(s) Performed: COLONOSCOPY WITH PROPOFOL ESOPHAGOGASTRODUODENOSCOPY (EGD)  Patient Location: PACU  Anesthesia Type:General  Level of Consciousness: awake  Airway & Oxygen Therapy: Patient Spontanous Breathing  Post-op Assessment: Report given to RN and Post -op Vital signs reviewed and stable  Post vital signs: Reviewed and stable  Last Vitals:  Vitals Value Taken Time  BP 92/57 08/03/22 1248  Temp 36.6 C 08/03/22 1248  Pulse 73 08/03/22 1248  Resp 18 08/03/22 1248  SpO2 97 % 08/03/22 1248    Last Pain:  Vitals:   08/03/22 1248  TempSrc:   PainSc: 0-No pain      Patients Stated Pain Goal: 2 (00/71/21 9758)  Complications: No notable events documented.

## 2022-08-03 NOTE — Op Note (Signed)
[facility] Patient Name: Sean Thomas Procedure Date: 08/03/2022 12:06 PM MRN: [id number] Date of Birth: 30-Nov-1960 Attending MD: Elon Alas. Abbey Chatters , [place], [phone] CSN: [id number] Age: 62 Admit Type: Inpatient Procedure:                Upper GI endoscopy Indications:              Acute post hemorrhagic anemia, Dysphagia Providers:                Elon Alas. Abbey Chatters, DO, Janeece Riggers, RN, Ladoris Gene                            Technician, Technician Referring MD:              Medicines:                See the Anesthesia note for documentation of the                            administered medications Complications:            No immediate complications. Estimated Blood Loss:     Estimated blood loss was minimal. Procedure:                Pre-Anesthesia Assessment:                           - The anesthesia plan was to use monitored                            anesthesia care (MAC).                           After obtaining informed consent, the endoscope was                            passed under direct vision. Throughout the                            procedure, the patient's blood pressure, pulse, and                            oxygen saturations were monitored continuously. The                            GIF-H190 ([id number]) scope was introduced through the                            mouth, and advanced to the second part of duodenum.                            The upper GI endoscopy was accomplished without                            difficulty. The patient tolerated the procedure                            well.  Scope In: 12:23:41 PM Scope Out: 12:29:14 PM Total Procedure Duration: 0 hours 5 minutes 33 seconds  Findings:      Non-severe esophagitis with no bleeding was found in the middle third of       the esophagus. Cells for cytology were obtained by brushing.      One superficial esophageal ulcer with no bleeding and no stigmata of       recent bleeding was found in the  distal esophagus. The lesion was 4 mm       in largest dimension.      A 3 cm hiatal hernia was found. Few small Cameron erosions seen      The duodenal bulb, first portion of the duodenum and second portion of       the duodenum were normal. Impression:               - Non-severe candidiasis esophagitis with no                            bleeding. Cells for cytology obtained.                           - Esophageal ulcer with no bleeding and no stigmata                            of recent bleeding.                           - 3 cm hiatal hernia.                           - Normal duodenal bulb, first portion of the                            duodenum and second portion of the duodenum. Moderate Sedation:      Per Anesthesia Care Recommendation:           - Return patient to hospital ward for ongoing care.                           - Use a proton pump inhibitor PO BID.                           - Limit NSAID use                           - Treat for candidal esophagitis if cytology                            positive                           - Proceed with colonoscopy Procedure Code(s):        --- Professional ---                           16579, Esophagogastroduodenoscopy, flexible,  transoral; diagnostic, including collection of                            specimen(s) by brushing or washing, when performed                            (separate procedure) Diagnosis Code(s):        --- Professional ---                           B37.81, Candidal esophagitis                           K22.10, Ulcer of esophagus without bleeding                           K44.9, Diaphragmatic hernia without obstruction or                            gangrene                           D62, Acute posthemorrhagic anemia                           R13.10, Dysphagia, unspecified CPT copyright 2022 American Medical Association. All rights reserved. The codes documented in this report are  preliminary and upon coder review may  be revised to meet current compliance requirements. Elon Alas. Abbey Chatters, DO Grannis Abbey Chatters, DO 08/03/2022 12:32:03 PM This report has been signed electronically. Number of Addenda: 0

## 2022-08-03 NOTE — Progress Notes (Signed)
At 2300, patient refused to continue to try and finish bowel prep. He only drank 2 thirds of prep.   Patient ambulated to the restroom and states he is clear, but did not let this nurse or CNA see. He refused to let us take his drink at midnight.   This am patient refused to let this nurse give Enema, this was witnessed by Reino Kent LPN, and Elie Goody, CNA.  Patient proceeded to state "I ain't doing a goddamn thing till I get something to eat, I haven't ate in three damn days."

## 2022-08-03 NOTE — Plan of Care (Signed)

## 2022-08-03 NOTE — Interval H&P Note (Signed)
History and Physical Interval Note:  08/03/2022 11:03 AM  Sean Thomas  has presented today for surgery, with the diagnosis of anemia, rectal bleeding/heme positive stool in setting of frequent NSAID use.  The various methods of treatment have been discussed with the patient and family. After consideration of risks, benefits and other options for treatment, the patient has consented to  Procedure(s): COLONOSCOPY WITH PROPOFOL (N/A) ESOPHAGOGASTRODUODENOSCOPY (EGD) (N/A) as a surgical intervention.  The patient's history has been reviewed, patient examined, no change in status, stable for surgery.  I have reviewed the patient's chart and labs.  Questions were answered to the patient's satisfaction.     Eloise Harman

## 2022-08-03 NOTE — Discharge Summary (Signed)
Physician Discharge Summary  Sean Thomas GYI:[id number] DOB: [birth year] DOA: 08/01/2022  PCP: Pcp, No  Admit date: 08/01/2022  Discharge date: 08/03/2022  Admitted From:Home  Disposition:  Home  Recommendations for Outpatient Follow-up:  Follow up with PCP in 1-2 weeks Please obtain BMP/CBC in one week Follow-up with gastroenterology as schedule 2/14 at 1 PM and follow-up cytology results Continue on PPI twice daily and avoid NSAIDs Norco prescribed for 15 tablets and 0 refills due to intractable back pain  Home Health: None  Equipment/Devices: None  Discharge Condition:Stable  CODE STATUS: Full  Diet recommendation: Heart Healthy  Brief/Interim Summary: Sean Thomas is a 62 y.o. male with medical history significant of COPD, GERD, tobacco abuse who presents to the emergency department due to chest pain.  Patient was admitted for evaluation of acute blood loss anemia and received 3 unit PRBC with improvement in his hemoglobin levels and no further chest pain noted.  He continues to have significant back pain throughout the course of his admission and was frequently complaining about the fact that he did not get to eat as he was being evaluated by GI.  He did undergo EGD with colonoscopy on 1/18 with no active bleeding noted, there were findings concerning for Candida esophagitis which will be followed up by GI after cytology returns.  In the meantime, he is stable for discharge from GI perspective and will remain on PPI twice daily and is to avoid all NSAIDs.  He was noted to have some demand ischemia with elevated troponin levels that was likely the culprit behind his chest pain and he did undergo 2D echocardiogram with preserved LVEF and no wall motion abnormalities.  Discharge Diagnoses:  Principal Problem:   GI bleed Active Problems:   COPD (chronic obstructive pulmonary disease) (HCC)   Hyperglycemia   Iron deficiency anemia   GERD (gastroesophageal reflux disease)    History of colonic polyps   Tobacco use disorder   Symptomatic anemia   Acute blood loss anemia   Elevated troponin   Thrombocytosis   Occult blood in stools  Principal discharge diagnosis: Chest pain secondary to demand ischemia in the setting of acute blood loss anemia.  Discharge Instructions  Discharge Instructions     Diet - low sodium heart healthy   Complete by: As directed    Increase activity slowly   Complete by: As directed       Allergies as of 08/03/2022       Reactions   Advil [ibuprofen] Diarrhea   Aleve [naproxen Sodium] Nausea And Vomiting   diarrhea        Medication List     TAKE these medications    albuterol 108 (90 Base) MCG/ACT inhaler Commonly known as: ProAir HFA Inhale 2 puffs into the lungs every 4 (four) hours as needed for wheezing or shortness of breath.   gabapentin 600 MG tablet Commonly known as: NEURONTIN Take 600 mg by mouth 3 (three) times daily.   HYDROcodone-acetaminophen 5-325 MG tablet Commonly known as: NORCO/VICODIN Take 1 tablet by mouth every 4 (four) hours as needed for moderate pain or severe pain.   ipratropium-albuterol 0.5-2.5 (3) MG/3ML Soln Commonly known as: DUONEB Take 3 mLs by nebulization every 6 (six) hours as needed.   methocarbamol 500 MG tablet Commonly known as: ROBAXIN Take by mouth.   pantoprazole 40 MG tablet Commonly known as: PROTONIX Take 1 tablet (40 mg total) by mouth 2 (two) times daily before a meal.   predniSONE 20  MG tablet Commonly known as: DELTASONE Take 2 tablets (40 mg total) by mouth daily with breakfast. What changed: how much to take   QUEtiapine 50 MG tablet Commonly known as: SEROQUEL Take 50 mg by mouth at bedtime.   Trelegy Ellipta 100-62.5-25 MCG/ACT Aepb Generic drug: Fluticasone-Umeclidin-Vilant Inhale 1 puff into the lungs daily.   Trelegy Ellipta 100-62.5-25 MCG/ACT Aepb Generic drug: Fluticasone-Umeclidin-Vilant Inhale 28 each into the lungs daily.         Follow-up [facility] at [facility]. Go to.   Specialty: Gastroenterology Contact information: [place] [address] [phone]               Allergies  Allergen Reactions   Advil [Ibuprofen] Diarrhea   Aleve [Naproxen Sodium] Nausea And Vomiting    diarrhea    Consultations: GI   Procedures/Studies: ECHOCARDIOGRAM COMPLETE  Result Date: 08/02/2022    ECHOCARDIOGRAM REPORT   Patient Name:   Sean Thomas Date of Exam: 08/02/2022 Medical Rec #:  [id number]      Height:       75.0 in Accession #:    [id number]     Weight:       176.4 lb Date of Birth:  1961/04/04       BSA:          2.080 m Patient Age:    17 years       BP:           104/69 mmHg Patient Gender: M              HR:           81 bpm. Exam Location:  [name] Procedure: 2D Echo, Cardiac Doppler and Color Doppler Indications:    Elevated Troponin  History:        Patient has no prior history of Echocardiogram examinations.                 Signs/Symptoms:Chest Pain and Shortness of Breath; Risk                 Factors:Current Smoker and Dyslipidemia.  Sonographer:    Greer Pickerel Referring Phys: [id number] [place] [facility]  Sonographer Comments: Image acquisition challenging due to patient body habitus and Image acquisition challenging due to COPD. IMPRESSIONS  1. Left ventricular ejection fraction, by estimation, is 50 to 55%. The left ventricle has low normal function. The left ventricle has no regional wall motion abnormalities. Left ventricular diastolic parameters were normal.  2. Right ventricular systolic function is normal. The right ventricular size is normal. Tricuspid regurgitation signal is inadequate for assessing PA pressure.  3. Left atrial size was moderately dilated.  4. Right atrial size was mildly dilated.  5. The mitral valve is normal in structure. Trivial mitral valve regurgitation. No evidence of mitral stenosis.  6. The aortic  valve has an indeterminant number of cusps. Aortic valve regurgitation is not visualized. No aortic stenosis is present.  7. Aortic dilatation noted. There is mild dilatation of the aortic root, measuring 43 mm.  8. The inferior vena cava is normal in size with greater than 50% respiratory variability, suggesting right atrial pressure of 3 mmHg. FINDINGS  Left Ventricle: Left ventricular ejection fraction, by estimation, is 50 to 55%. The left ventricle has low normal function. The left ventricle has no regional wall motion abnormalities. The left ventricular internal cavity size was normal in size. There is  no left ventricular hypertrophy. Left ventricular diastolic parameters were normal. Right Ventricle: The right ventricular size is normal. Right vetricular wall thickness was not well visualized. Right ventricular systolic function is normal. Tricuspid regurgitation signal is inadequate for assessing PA pressure. Left Atrium: Left atrial size was moderately dilated. Right Atrium: Right atrial size was mildly dilated. Pericardium: There is no evidence of pericardial effusion. Mitral Valve: The mitral valve is normal in structure. Trivial mitral valve regurgitation. No evidence of mitral valve stenosis. Tricuspid Valve: The tricuspid valve is normal in structure. Tricuspid valve regurgitation is not demonstrated. No evidence of tricuspid stenosis. Aortic Valve: The aortic valve has an indeterminant number of cusps. Aortic valve regurgitation is not visualized. No aortic stenosis is present. Aortic valve mean gradient measures 3.7 mmHg. Aortic valve peak gradient measures 6.6 mmHg. Aortic valve area, by VTI measures 3.60 cm. Pulmonic Valve: The pulmonic valve was not well visualized. Pulmonic valve regurgitation is not visualized. No evidence of pulmonic stenosis. Aorta: Aortic dilatation noted. There is mild dilatation of the aortic root, measuring 43 mm. Venous: The inferior vena cava is normal in size with  greater than 50% respiratory variability, suggesting right atrial pressure of 3 mmHg. IAS/Shunts: No atrial level shunt detected by color flow Doppler.  LEFT VENTRICLE PLAX 2D LVIDd:         5.60 cm   Diastology LVIDs:         4.40 cm   LV e' medial:    7.15 cm/s LV PW:         1.00 cm   LV E/e' medial:  9.9 LV IVS:        0.80 cm   LV e' lateral:   10.40 cm/s LVOT diam:     2.50 cm   LV E/e' lateral: 6.8 LV SV:         96 LV SV Index:   46 LVOT Area:     4.91 cm  RIGHT VENTRICLE RV S prime:     14.30 cm/s TAPSE (M-mode): 2.5 cm LEFT ATRIUM             Index        RIGHT ATRIUM           Index LA diam:        4.50 cm 2.16 cm/m   RA Area:     25.30 cm LA Vol (A2C):   96.0 ml 46.15 ml/m  RA Volume:   83.20 ml  40.00 ml/m LA Vol (A4C):   94.1 ml 45.24 ml/m LA Biplane Vol: 99.5 ml 47.83 ml/m  AORTIC VALVE                    PULMONIC VALVE AV Area (Vmax):    3.11 cm     PR End Diast Vel: 9.86 msec AV Area (Vmean):   3.07 cm AV Area (VTI):     3.60 cm AV Vmax:           127.98 cm/s AV Vmean:          89.948 cm/s AV VTI:            0.266 m AV Peak Grad:      6.6 mmHg AV Mean Grad:      3.7 mmHg LVOT Vmax:         81.00 cm/s LVOT Vmean:        56.300 cm/s LVOT VTI:          0.195 m LVOT/AV  VTI ratio: 0.73  AORTA Ao Root diam: 4.30 cm Ao Asc diam:  3.60 cm MITRAL VALVE MV Area (PHT): 4.89 cm     SHUNTS MV Decel Time: 155 msec     Systemic VTI:  0.20 m MV E velocity: 70.60 cm/s   Systemic Diam: 2.50 cm MV A velocity: 100.00 cm/s MV E/A ratio:  0.71 Carlyle Dolly MD Electronically signed by Carlyle Dolly MD Signature Date/Time: 08/02/2022/3:58:43 PM    Final    DG Ankle 2 Views Right  Result Date: 08/01/2022 CLINICAL DATA:  Right ankle pain following injury 3 weeks ago. EXAM: RIGHT ANKLE - 2 VIEW COMPARISON:  None Available. FINDINGS: There is no evidence of fracture, dislocation, or joint effusion. Degenerative changes are noted in the midfoot and ankle. Mild calcaneal spurring. Mild soft tissue swelling is  present over the lateral malleolus. Dystrophic calcifications are noted in the soft tissues. IMPRESSION: No acute fracture or dislocation. Electronically Signed   By: Brett Fairy M.D.   On: 08/01/2022 21:33   DG Chest 2 View  Result Date: 08/01/2022 CLINICAL DATA:  Shortness of breath.  History of COPD. EXAM: CHEST - 2 VIEW COMPARISON:  02/27/2022 FINDINGS: Mild cardiac enlargement. Bronchiectasis with peribronchial thickening and central interstitial changes likely chronic bronchitis. No airspace disease or consolidation in the lungs. No pleural effusions. No pneumothorax. Mediastinal contours appear intact. Multiple old left rib fractures. Moderate-sized esophageal hiatal hernia. Similar appearance to previous study. IMPRESSION: Chronic bronchitic changes in the lungs. Old left rib fractures. No evidence of active pulmonary disease. Electronically Signed   By: Lucienne Capers M.D.   On: 08/01/2022 20:28     Discharge Exam: Vitals:   08/03/22 1145 08/03/22 1248  BP:  (!) 92/57  Pulse: 78 73  Resp: (!) 26 18  Temp:  97.8 F (36.6 C)  SpO2: 96% 97%   Vitals:   08/03/22 0638 08/03/22 0702 08/03/22 1145 08/03/22 1248  BP: 129/86   (!) 92/57  Pulse: 80  78 73  Resp: 18  (!) 26 18  Temp: 98.9 F (37.2 C)   97.8 F (36.6 C)  TempSrc:      SpO2: 95% 96% 96% 97%  Weight:      Height:        General: Pt is alert, awake, not in acute distress Cardiovascular: RRR, S1/S2 +, no rubs, no gallops Respiratory: CTA bilaterally, no wheezing, no rhonchi Abdominal: Soft, NT, ND, bowel sounds + Extremities: no edema, no cyanosis    The results of significant diagnostics from this hospitalization (including imaging, microbiology, ancillary and laboratory) are listed below for reference.     Microbiology: No results found for this or any previous visit (from the past 240 hour(s)).   Labs: BNP (last 3 results) Recent Labs    02/16/22 0235  BNP 48.5   Basic Metabolic Panel: Recent Labs   Lab 08/01/22 1945 08/02/22 0450 08/03/22 0326  NA 136 140 141  K 4.3 3.9 3.9  CL 103 107 108  CO2 '23 26 28  '$ GLUCOSE 159* 90 98  BUN '13 13 9  '$ CREATININE 1.02 0.94 0.93  CALCIUM 8.4* 8.4* 8.1*  MG 2.2 2.4 2.4  PHOS  --  4.2  --    Liver Function Tests: Recent Labs  Lab 08/01/22 1945  AST 17  ALT 13  ALKPHOS 43  BILITOT 0.2*  PROT 6.5  ALBUMIN 3.5   Recent Labs  Lab 08/01/22 1945  LIPASE 39   No results for input(s): "  AMMONIA" in the last 168 hours. CBC: Recent Labs  Lab 08/01/22 1945 08/02/22 0450 08/02/22 0955 08/03/22 0326  WBC 11.4* 14.0*  --  13.5*  HGB 4.4* 7.1* 7.5* 7.4*  HCT 18.2* 24.8* 27.5* 26.0*  MCV 62.1* 68.1*  --  69.5*  PLT 415* 349  --  381   Cardiac Enzymes: No results for input(s): "CKTOTAL", "CKMB", "CKMBINDEX", "TROPONINI" in the last 168 hours. BNP: Invalid input(s): "POCBNP" CBG: No results for input(s): "GLUCAP" in the last 168 hours. D-Dimer No results for input(s): "DDIMER" in the last 72 hours. Hgb A1c No results for input(s): "HGBA1C" in the last 72 hours. Lipid Profile No results for input(s): "CHOL", "HDL", "LDLCALC", "TRIG", "CHOLHDL", "LDLDIRECT" in the last 72 hours. Thyroid function studies No results for input(s): "TSH", "T4TOTAL", "T3FREE", "THYROIDAB" in the last 72 hours.  Invalid input(s): "FREET3" Anemia work up Recent Labs    08/02/22 0450  FERRITIN 2*  TIBC 360  IRON 297*   Urinalysis    Component Value Date/Time   COLORURINE YELLOW 07/28/2021 1654   APPEARANCEUR CLEAR 07/28/2021 1654   APPEARANCEUR Clear 04/06/2021 1011   LABSPEC 1.011 07/28/2021 1654   PHURINE 5.0 07/28/2021 1654   GLUCOSEU NEGATIVE 07/28/2021 1654   HGBUR NEGATIVE 07/28/2021 1654   BILIRUBINUR NEGATIVE 07/28/2021 1654   BILIRUBINUR Negative 04/06/2021 [place] 07/28/2021 1654   PROTEINUR NEGATIVE 07/28/2021 1654   NITRITE NEGATIVE 07/28/2021 1654   LEUKOCYTESUR NEGATIVE 07/28/2021 1654   Sepsis  Labs Recent Labs  Lab 08/01/22 1945 08/02/22 0450 08/03/22 0326  WBC 11.4* 14.0* 13.5*   Microbiology No results found for this or any previous visit (from the past 240 hour(s)).   Time coordinating discharge: 35 minutes  SIGNED:   Rodena Goldmann, DO [facility] 08/03/2022, 1:14 PM  If 7PM-7AM, please contact night-coverage [email or web address]

## 2022-08-03 NOTE — Op Note (Signed)
[facility] Patient Name: Sean Thomas Procedure Date: 08/03/2022 12:05 PM MRN: [id number] Date of Birth: [birth year] Attending MD: Elon Alas. Abbey Chatters , [place], [id number] CSN: [id number] Age: 62 Admit Type: Inpatient Procedure:                Colonoscopy Indications:              Acute post hemorrhagic anemia Providers:                Elon Alas. Abbey Chatters, DO, Janeece Riggers, RN, Ladoris Gene                            Technician, Technician Referring MD:              Medicines:                See the Anesthesia note for documentation of the                            administered medications Complications:            No immediate complications. Estimated Blood Loss:     Estimated blood loss: none. Procedure:                Pre-Anesthesia Assessment:                           - The anesthesia plan was to use monitored                            anesthesia care (MAC).                           After obtaining informed consent, the colonoscope                            was passed under direct vision. Throughout the                            procedure, the patient's blood pressure, pulse, and                            oxygen saturations were monitored continuously. The                            PCF-HQ190L ([id number]) scope was introduced through                            the anus and advanced to the the cecum, identified                            by appendiceal orifice and ileocecal valve. The                            colonoscopy was performed without difficulty. The                            patient tolerated the procedure well. The  quality                            of the bowel preparation was evaluated using the                            BBPS [facility] Bowel Preparation Scale) with scores                            of: Right Colon = 2 (minor amount of residual                            staining, small fragments of stool and/or opaque                            liquid, but mucosa seen  well), Transverse Colon = 2                            (minor amount of residual staining, small fragments                            of stool and/or opaque liquid, but mucosa seen                            well) and Left Colon = 2 (minor amount of residual                            staining, small fragments of stool and/or opaque                            liquid, but mucosa seen well). The total BBPS score                            equals 6. The quality of the bowel preparation was                            good. Scope In: 12:33:38 PM Scope Out: 12:45:22 PM Scope Withdrawal Time: 0 hours 8 minutes 56 seconds  Total Procedure Duration: 0 hours 11 minutes 44 seconds  Findings:      The perianal and digital rectal examinations were normal.      Multiple medium-mouthed diverticula were found in the sigmoid colon.      There is no endoscopic evidence of bleeding in the entire colon.      The terminal ileum appeared normal. Impression:               - Diverticulosis in the sigmoid colon.                           - The examined portion of the ileum was normal.                           - No specimens collected. Moderate Sedation:      Per Anesthesia Care Recommendation:           -  Return patient to hospital ward for ongoing care.                           - Resume regular diet.                           - PO PPI BID, limit NSAID use Procedure Code(s):        --- Professional ---                           [phone], Colonoscopy, flexible; diagnostic, including                            collection of specimen(s) by brushing or washing,                            when performed (separate procedure) Diagnosis Code(s):        --- Professional ---                           D62, Acute posthemorrhagic anemia                           K57.30, Diverticulosis of large intestine without                            perforation or abscess without bleeding CPT copyright 2022 American Medical  Association. All rights reserved. The codes documented in this report are preliminary and upon coder review may  be revised to meet current compliance requirements. Elon Alas. Abbey Chatters, DO [place] Abbey Chatters, DO 08/03/2022 12:48:04 PM This report has been signed electronically. Number of Addenda: 0

## 2022-08-03 NOTE — Progress Notes (Signed)
Ng Discharge Note  Admit Date:  08/01/2022 Discharge date: 08/03/2022   [name] to be D/C'd Home per MD order.  AVS completed. Patient/caregiver able to verbalize understanding.  Discharge Medication: Allergies as of 08/03/2022       Reactions   Advil [ibuprofen] Diarrhea   Aleve [naproxen Sodium] Nausea And Vomiting   diarrhea        Medication List     TAKE these medications    albuterol 108 (90 Base) MCG/ACT inhaler Commonly known as: ProAir HFA Inhale 2 puffs into the lungs every 4 (four) hours as needed for wheezing or shortness of breath.   gabapentin 600 MG tablet Commonly known as: NEURONTIN Take 600 mg by mouth 3 (three) times daily.   HYDROcodone-acetaminophen 5-325 MG tablet Commonly known as: NORCO/VICODIN Take 1 tablet by mouth every 4 (four) hours as needed for moderate pain or severe pain.   ipratropium-albuterol 0.5-2.5 (3) MG/3ML Soln Commonly known as: DUONEB Take 3 mLs by nebulization every 6 (six) hours as needed.   methocarbamol 500 MG tablet Commonly known as: ROBAXIN Take by mouth.   pantoprazole 40 MG tablet Commonly known as: PROTONIX Take 1 tablet (40 mg total) by mouth 2 (two) times daily before a meal.   predniSONE 20 MG tablet Commonly known as: DELTASONE Take 2 tablets (40 mg total) by mouth daily with breakfast. What changed: how much to take   QUEtiapine 50 MG tablet Commonly known as: SEROQUEL Take 50 mg by mouth at bedtime.   Trelegy Ellipta 100-62.5-25 MCG/ACT Aepb Generic drug: Fluticasone-Umeclidin-Vilant Inhale 1 puff into the lungs daily.   Trelegy Ellipta 100-62.5-25 MCG/ACT Aepb Generic drug: Fluticasone-Umeclidin-Vilant Inhale 28 each into the lungs daily.        Discharge Assessment: Vitals:   08/03/22 1145 08/03/22 1248  BP:  (!) 92/57  Pulse: 78 73  Resp: (!) 26 18  Temp:  97.8 F (36.6 C)  SpO2: 96% 97%   Skin clean, dry and intact without evidence of skin break down, no evidence of skin  tears noted. IV catheter discontinued intact. Site without signs and symptoms of complications - no redness or edema noted at insertion site, patient denies c/o pain - only slight tenderness at site.  Dressing with slight pressure applied.  D/c Instructions-Education: Discharge instructions given to patient/family with verbalized understanding. D/c education completed with patient/family including follow up instructions, medication list, d/c activities limitations if indicated, with other d/c instructions as indicated by MD - patient able to verbalize understanding, all questions fully answered. Patient instructed to return to ED, call 911, or call MD for any changes in condition.  Patient escorted via [place], and D/C home via private auto.  Tsosie Billing, LPN 0/34/9179 1:50 PM

## 2022-08-03 NOTE — Progress Notes (Signed)
Administered tap water enema (744m) for pt's colonoscopy today. Pt tolerated well. Will continue to monitor.

## 2022-08-03 NOTE — Anesthesia Preprocedure Evaluation (Signed)
Anesthesia Evaluation  Patient identified by MRN, date of birth, ID band Patient awake    Reviewed: Allergy & Precautions, NPO status , Patient's Chart, lab work & pertinent test results  Airway Mallampati: II  TM Distance: >3 FB Neck ROM: Full    Dental  (+) Edentulous Upper, Edentulous Lower   Pulmonary COPD,  COPD inhaler, Current Smoker   Pulmonary exam normal        Cardiovascular hypertension, Pt. on medications negative cardio ROS  Rhythm:Irregular Rate:Normal  Elevated troponin, ? GI bleed cause, denies chest pain, EKG no ACS   Neuro/Psych  Headaches  negative psych ROS   GI/Hepatic Neg liver ROS,GERD  Controlled,,(+)       alcohol use and marijuana use  Endo/Other  negative endocrine ROS    Renal/GU negative Renal ROS  negative genitourinary   Musculoskeletal  (+) Arthritis ,  DDD   Abdominal   Peds negative pediatric ROS (+)  Hematology  (+) Blood dyscrasia, anemia Stable acute on chronic   Anesthesia Other Findings   Reproductive/Obstetrics negative OB ROS                             Anesthesia Physical Anesthesia Plan  ASA: 3  Anesthesia Plan: General   Post-op Pain Management: Minimal or no pain anticipated   Induction: Intravenous  PONV Risk Score and Plan: Propofol infusion  Airway Management Planned: Nasal Cannula and Natural Airway  Additional Equipment:   Intra-op Plan:   Post-operative Plan:   Informed Consent: I have reviewed the patients History and Physical, chart, labs and discussed the procedure including the risks, benefits and alternatives for the proposed anesthesia with the patient or authorized representative who has indicated his/her understanding and acceptance.       Plan Discussed with: CRNA  Anesthesia Plan Comments:         Anesthesia Quick Evaluation

## 2022-08-03 NOTE — Telephone Encounter (Signed)
Please arrange hospital follow-up for this patient in 3 to 4 weeks with app or Dr. Gala Romney.  Thank you

## 2022-08-08 ENCOUNTER — Encounter (HOSPITAL_COMMUNITY): Payer: Self-pay | Admitting: Internal Medicine

## 2022-08-29 ENCOUNTER — Encounter: Payer: Self-pay | Admitting: Gastroenterology

## 2022-08-29 NOTE — Progress Notes (Unsigned)
Referring Provider: No ref. provider found Primary Care Physician:  Pcp, No Primary GI Physician: Dr. Gala Romney  No chief complaint on file.   HPI:   Sean Thomas is a 62 y.o. male with history of COPD, HTN, HLD, IDA, dysphagia, Candida esophagitis June 2021 s/p treatment with Diflucan, hiatal hernia with Lysbeth Galas lesion June 2021, adenomatous colon polyps, presenting today for hospital follow-up.  He was hospitalized 1/16-1/18 after presenting to the emergency room with chest pain and nausea, found to have significantly low hemoglobin of 4.4, down from 12.8 in August, heme positive stool.  Iron 297, saturation 83%, ferritin 2 does these were drawn after blood transfusions so unable to rely on accuracy of these.  Reported some intermittent rectal bleeding.  No significant upper GI symptoms but admitted to heavy doses of Aleve and BC's. EGD with nonsevere Candida esophagitis, esophageal ulcer, centimeter hiatal hernia.  Colonoscopy with diverticulosis in the sigmoid colon, otherwise normal exam.  He received 3 units PRBCs and hemoglobin improved to 7.4 on day of discharge.  He was discharged on PPI twice daily.  Cytology from KOH prep resulted after patient was discharged and was positive for yeast.  It does not appear that patient was prescribed Diflucan to treat his Candida esophagitis.   Most recent labs 08/24/2022 with hemoglobin improved to 8.6 with microcytic indices.  Today:   Candida esophagitis:    NSAIDs:  Past Medical History:  Diagnosis Date   Cervical pain 11/21/2012   COPD (chronic obstructive pulmonary disease) (HCC)    DDD (degenerative disc disease)    Esophageal dysphagia    Essential hypertension    Fracture of multiple ribs 11/01/2012   Fracture of occipital condyle (HCC) 11/21/2012   Headache    Hyperlipidemia    Inguinal hernia 12/19/2013   Microcytic anemia    N&V (nausea and vomiting) 05/23/2016    Past Surgical History:  Procedure Laterality Date    BACK SURGERY     COLONOSCOPY WITH PROPOFOL N/A 06/15/2016   RMR: inadequate bowel prep all mucosal surfaces not well seen. 5 mm tubular adenoma removed from the hepatic flexure. 3 mm polyp from the rectum was hyperplastic. He had scattered diverticula   COLONOSCOPY WITH PROPOFOL N/A 08/03/2022   Procedure: COLONOSCOPY WITH PROPOFOL;  Surgeon: Eloise Harman, DO;  [facility];  Service: Endoscopy;  Laterality: N/A;   ESOPHAGEAL BRUSHING  12/29/2019   Procedure: ESOPHAGEAL BRUSHING;  Surgeon: Daneil Dolin, MD;  [facility];  Service: Endoscopy;;   ESOPHAGOGASTRODUODENOSCOPY N/A 08/03/2022   Procedure: ESOPHAGOGASTRODUODENOSCOPY (EGD);  Surgeon: Eloise Harman, DO;  [facility];  Service: Endoscopy;  Laterality: N/A;   ESOPHAGOGASTRODUODENOSCOPY (EGD) WITH PROPOFOL N/A 06/15/2016   Dr. Gala Romney: Large hiatal hernia, reflux esophagitis, Schatzki ring with focal area of ulceration, status post dilation with 77F Maloney dilator with moderate improvement in luminal narrowing.   ESOPHAGOGASTRODUODENOSCOPY (EGD) WITH PROPOFOL N/A 12/29/2019   Surgeon: Daneil Dolin, MD; Candida esophagitis, Schatzki ring status post dilation, medium sized hiatal hernia with a single Cameron lesion.  KOH positive, treated with Diflucan for 21 days.   ESOPHAGOGASTRODUODENOSCOPY (EGD) WITH PROPOFOL N/A 02/22/2022   Procedure: ESOPHAGOGASTRODUODENOSCOPY (EGD) WITH PROPOFOL;  Surgeon: Daneil Dolin, MD;  [facility];  Service: Endoscopy;  Laterality: N/A;  1:00pm   INGUINAL HERNIA REPAIR Left 07/24/2014   Procedure: LAPAROSCOPIC LEFT INGUINAL HERNIA REPAIR ;  Surgeon: Ralene Ok, MD;  Location: [place];  Service: General;  Laterality:  Left;   INGUINAL HERNIA REPAIR Right 08/17/2021   Procedure: HERNIA REPAIR INGUINAL ADULT W/ MESH;  Surgeon: Aviva Signs, MD;  [facility];  Service: General;  Laterality: Right;   INSERTION OF MESH Left 07/24/2014   Procedure:  INSERTION OF MESH;  Surgeon: Ralene Ok, MD;  Location: [place];  Service: General;  Laterality: Left;   [place] N/A 06/15/2016   Procedure: Venia Minks DILATION;  Surgeon: Daneil Dolin, MD;  [facility];  Service: Endoscopy;  Laterality: N/A;   MALONEY DILATION N/A 12/29/2019   Procedure: Venia Minks DILATION;  Surgeon: Daneil Dolin, MD;  [facility];  Service: Endoscopy;  Laterality: N/A;   MALONEY DILATION N/A 02/22/2022   Procedure: Venia Minks DILATION;  Surgeon: Daneil Dolin, MD;  [facility];  Service: Endoscopy;  Laterality: N/A;   POLYPECTOMY  06/15/2016   Procedure: POLYPECTOMY;  Surgeon: Daneil Dolin, MD;  [facility];  Service: Endoscopy;;  colon   right lung surgery Right     Current Outpatient Medications  Medication Sig Dispense Refill   albuterol (PROAIR HFA) 108 (90 Base) MCG/ACT inhaler Inhale 2 puffs into the lungs every 4 (four) hours as needed for wheezing or shortness of breath. 18 g 1   Fluticasone-Umeclidin-Vilant (TRELEGY ELLIPTA) 100-62.5-25 MCG/ACT AEPB Inhale 28 each into the lungs daily. (Patient not taking: Reported on 08/01/2022) 2 each 0   Fluticasone-Umeclidin-Vilant (TRELEGY ELLIPTA) 100-62.5-25 MCG/INH AEPB Inhale 1 puff into the lungs daily. 28 each 0   gabapentin (NEURONTIN) 600 MG tablet Take 600 mg by mouth 3 (three) times daily.     HYDROcodone-acetaminophen (NORCO/VICODIN) 5-325 MG tablet Take 1 tablet by mouth every 4 (four) hours as needed for moderate pain or severe pain. 15 tablet 0   ipratropium-albuterol (DUONEB) 0.5-2.5 (3) MG/3ML SOLN Take 3 mLs by nebulization every 6 (six) hours as needed. 360 mL 2   methocarbamol (ROBAXIN) 500 MG tablet Take by mouth.     pantoprazole (PROTONIX) 40 MG tablet Take 1 tablet (40 mg total) by mouth 2 (two) times daily before a meal. 60 tablet 11   predniSONE (DELTASONE) 20 MG tablet Take 2 tablets (40 mg total) by mouth daily with breakfast.  (Patient taking differently: Take 10 mg by mouth daily with breakfast.) 8 tablet 0   QUEtiapine (SEROQUEL) 50 MG tablet Take 50 mg by mouth at bedtime.     No current facility-administered medications for this visit.    Allergies as of 08/30/2022 - Review Complete 08/03/2022  Allergen Reaction Noted   Advil [ibuprofen] Diarrhea 07/20/2014   Aleve [naproxen sodium] Nausea And Vomiting 04/23/2011    Family History  Problem Relation Age of Onset   Diabetes Mother    Alzheimer's disease Mother    Diabetes Sister    Colon cancer Neg Hx    Liver disease Neg Hx     Social History   Socioeconomic History   Marital status: Single    Spouse name: Not on file   Number of children: Not on file   Years of education: Not on file   Highest education level: Not on file  Occupational History   Not on file  Tobacco Use   Smoking status: Every Day    Packs/day: 0.25    Years: 45.00    Total pack years: 11.25    Types: Cigarettes   Smokeless tobacco: Never  Vaping Use   Vaping Use: Never used  Substance and Sexual Activity   Alcohol use: Yes    Alcohol/week: 9.0 standard drinks of alcohol    Types: 9 Cans of beer per week    Comment: occ beer   Drug use: Yes    Types: Marijuana    Comment: last marijuana was 1 month ago   Sexual activity: Not on file  Other Topics Concern   Not on file  Social History Narrative   Not on file   Social Determinants of Health   Financial Resource Strain: Not on file  Food Insecurity: No Food Insecurity (08/02/2022)   Hunger Vital Sign    Worried About Running Out of Food in the Last Year: Never true    Ran Out of Food in the Last Year: Never true  Transportation Needs: No Transportation Needs (08/02/2022)   PRAPARE - Hydrologist (Medical): No    Lack of Transportation (Non-Medical): No  Physical Activity: Not on file  Stress: Not on file  Social Connections: Not on file    Review of Systems: Gen: Denies  fever, chills, anorexia. Denies fatigue, weakness, weight loss.  CV: Denies chest pain, palpitations, syncope, peripheral edema, and claudication. Resp: Denies dyspnea at rest, cough, wheezing, coughing up blood, and pleurisy. GI: Denies vomiting blood, jaundice, and fecal incontinence.   Denies dysphagia or odynophagia. Derm: Denies rash, itching, dry skin Psych: Denies depression, anxiety, memory loss, confusion. No homicidal or suicidal ideation.  Heme: Denies bruising, bleeding, and enlarged lymph nodes.  Physical Exam: There were no vitals taken for this visit. General:   Alert and oriented. No distress noted. Pleasant and cooperative.  Head:  Normocephalic and atraumatic. Eyes:  Conjuctiva clear without scleral icterus. Heart:  S1, S2 present without murmurs appreciated. Lungs:  Clear to auscultation bilaterally. No wheezes, rales, or rhonchi. No distress.  Abdomen:  +BS, soft, non-tender and non-distended. No rebound or guarding. No HSM or masses noted. Msk:  Symmetrical without gross deformities. Normal posture. Extremities:  Without edema. Neurologic:  Alert and  oriented x4 Psych:  Normal mood and affect.    Assessment:     Plan:  ***   Aliene Altes, PA-C [facility] 08/30/2022

## 2022-08-30 ENCOUNTER — Ambulatory Visit (INDEPENDENT_AMBULATORY_CARE_PROVIDER_SITE_OTHER): Payer: Medicaid Other | Admitting: Gastroenterology

## 2022-08-30 ENCOUNTER — Encounter: Payer: Self-pay | Admitting: Gastroenterology

## 2022-08-30 VITALS — BP 127/73 | HR 105 | Temp 98.0°F | Ht 74.0 in | Wt 197.2 lb

## 2022-08-30 DIAGNOSIS — B3781 Candidal esophagitis: Secondary | ICD-10-CM | POA: Diagnosis not present

## 2022-08-30 DIAGNOSIS — D509 Iron deficiency anemia, unspecified: Secondary | ICD-10-CM | POA: Diagnosis not present

## 2022-08-30 DIAGNOSIS — K219 Gastro-esophageal reflux disease without esophagitis: Secondary | ICD-10-CM

## 2022-08-30 DIAGNOSIS — R1310 Dysphagia, unspecified: Secondary | ICD-10-CM | POA: Diagnosis not present

## 2022-08-30 MED ORDER — FERROUS SULFATE 325 (65 FE) MG PO TBEC: 325.0000 mg | DELAYED_RELEASE_TABLET | Freq: Every day | ORAL | 3 refills | Status: DC

## 2022-08-30 MED ORDER — FLUCONAZOLE 100 MG PO TABS: ORAL_TABLET | ORAL | 0 refills | Status: DC

## 2022-08-30 NOTE — Patient Instructions (Addendum)
Start fluconazole to treat the yeast infection in your esophagus.  Take fluconazole 400 mg on day 1, then 200 mg daily to complete a 21-day course.  I have sent a prescription to your pharmacy.  Continue taking pantoprazole 40 mg twice daily.  Try to limit BC powders and Aleve is much as possible.  Try using Tylenol to help alleviate your pain.  No more than 3000 g of Tylenol in 24 hours.  Start iron (ferrous sulfate) 325 mg daily.  I have sent a prescription to your pharmacy.  Swallowing precautions:  Eat slowly, take small bites, chew thoroughly, drink plenty of liquids throughout meals.  Avoid trough textures All meats should be chopped finely.  If something gets hung in your esophagus and will not come up or go down, proceed to the emergency room.    We will plan to see you back in about 4 weeks.  We will plan to recheck your blood levels at that time.    Aliene Altes, PA-C [facility]

## 2022-09-30 NOTE — Progress Notes (Deleted)
Referring Provider: No ref. provider found Primary Care Physician:  Pcp, No Primary GI Physician: Dr. Marland Kitchen  No chief complaint on file.   HPI:   Sean Thomas is a 62 y.o. male presenting today with a history of COPD, HTN, HLD, IDA, dysphagia, Candida esophagitis June 2021 s/p treatment with Diflucan, hiatal hernia with Lysbeth Galas lesion June 2021, adenomatous colon polyps. Hospitalized in January 2021 with anemia, found to have nonsevere Candida esophagitis, esophageal ulcer, 3 centimeter hiatal hernia with a few small Cameron erosions, colonoscopy with diverticulosis, otherwise normal exam. He is  presenting today for follow-up.   Last seen in our office 08/30/2022.  He had not been treated for Candida esophagitis and was started on Diflucan.  He continued to use BCs and Aleve for chronic back pain and stated he was not going to stop.  He did have plans to see [facility] in April for consideration of surgery.  Was compliant with PPI twice daily.  Patient..  Whether this was secondary to persistent Candida esophagitis.  Plan to monitor for now.  Recommended starting on follow-up for IDA.  Plan to recheck labs at follow-up.  Today:  IDA:  Dysphagia:  Candida esophagitis:  GERD:   Past Medical History:  Diagnosis Date   Candida esophagitis ([place])    12/2019; 07/2022   Cervical pain 11/21/2012   COPD (chronic obstructive pulmonary disease) (HCC)    DDD (degenerative disc disease)    Esophageal dysphagia    Essential hypertension    Fracture of multiple ribs 11/01/2012   Fracture of occipital condyle ([place]) 11/21/2012   Headache    Hyperlipidemia    Inguinal hernia 12/19/2013   Microcytic anemia    N&V (nausea and vomiting) 05/23/2016    Past Surgical History:  Procedure Laterality Date   BACK SURGERY     COLONOSCOPY WITH PROPOFOL N/A 06/15/2016   RMR: inadequate bowel prep all mucosal surfaces not well seen. 5 mm tubular adenoma removed from the hepatic flexure. 3 mm  polyp from the rectum was hyperplastic. He had scattered diverticula   COLONOSCOPY WITH PROPOFOL N/A 08/03/2022   Surgeon: Eloise Harman, DO; diverticulosis in the sigmoid colon, otherwise normal exam.   ESOPHAGEAL BRUSHING  12/29/2019   Procedure: ESOPHAGEAL BRUSHING;  Surgeon: Daneil Dolin, MD;  [facility];  Service: Endoscopy;;   ESOPHAGOGASTRODUODENOSCOPY N/A 08/03/2022   Surgeon: Eloise Harman, DO; nonsevere Candida esophagitis, esophageal ulcer, 3 centimeter hiatal hernia with a few small Cameron erosions.   ESOPHAGOGASTRODUODENOSCOPY (EGD) WITH PROPOFOL N/A 06/15/2016   Dr. Gala Romney: Large hiatal hernia, reflux esophagitis, Schatzki ring with focal area of ulceration, status post dilation with 81F Maloney dilator with moderate improvement in luminal narrowing.   ESOPHAGOGASTRODUODENOSCOPY (EGD) WITH PROPOFOL N/A 12/29/2019   Surgeon: Daneil Dolin, MD; Candida esophagitis, Schatzki ring status post dilation, medium sized hiatal hernia with a single Cameron lesion.  KOH positive, treated with Diflucan for 21 days.   ESOPHAGOGASTRODUODENOSCOPY (EGD) WITH PROPOFOL N/A 02/22/2022   Surgeon: Daneil Dolin, MD; Moderate sized hiatal hernia with Lysbeth Galas erosions, Schatzki's ring/peptic stricture with ulcer reflux esophagitis s/p dilation   INGUINAL HERNIA REPAIR Left 07/24/2014   Procedure: LAPAROSCOPIC LEFT INGUINAL HERNIA REPAIR ;  Surgeon: Ralene Ok, MD;  Location: [place];  Service: General;  Laterality: Left;   INGUINAL HERNIA REPAIR Right 08/17/2021   Procedure: HERNIA REPAIR INGUINAL ADULT W/ MESH;  Surgeon: Aviva Signs, MD;  [facility];  Service: General;  Laterality:  Right;   INSERTION OF MESH Left 07/24/2014   Procedure: INSERTION OF MESH;  Surgeon: Ralene Ok, MD;  Location: [place];  Service: General;  Laterality: Left;   LUMBAR FUSION     [place] N/A 06/15/2016   Procedure: Venia Minks DILATION;  Surgeon: Daneil Dolin, MD;  [facility];  Service: Endoscopy;  Laterality: N/A;   MALONEY DILATION N/A 12/29/2019   Procedure: Venia Minks DILATION;  Surgeon: Daneil Dolin, MD;  [facility];  Service: Endoscopy;  Laterality: N/A;   MALONEY DILATION N/A 02/22/2022   Procedure: Venia Minks DILATION;  Surgeon: Daneil Dolin, MD;  [facility];  Service: Endoscopy;  Laterality: N/A;   POLYPECTOMY  06/15/2016   Procedure: POLYPECTOMY;  Surgeon: Daneil Dolin, MD;  [facility];  Service: Endoscopy;;  colon   right lung surgery Right     Current Outpatient Medications  Medication Sig Dispense Refill   ferrous sulfate 325 (65 FE) MG EC tablet Take 1 tablet (325 mg total) by mouth daily with breakfast. 30 tablet 3   fluconazole (DIFLUCAN) 100 MG tablet Take 4 tablets (400 mg total) on day 1, then take 2 tablets (200 mg total) every day thereafter for a total of 21 days. 44 tablet 0   Fluticasone-Umeclidin-Vilant (TRELEGY ELLIPTA) 100-62.5-25 MCG/INH AEPB Inhale 1 puff into the lungs daily. 28 each 0   gabapentin (NEURONTIN) 600 MG tablet Take 600 mg by mouth 3 (three) times daily.     pantoprazole (PROTONIX) 40 MG tablet Take 1 tablet (40 mg total) by mouth 2 (two) times daily before a meal. 60 tablet 11   predniSONE (DELTASONE) 20 MG tablet Take 2 tablets (40 mg total) by mouth daily with breakfast. (Patient taking differently: Take 10 mg by mouth daily with breakfast.) 8 tablet 0   QUEtiapine (SEROQUEL) 50 MG tablet Take 50 mg by mouth at bedtime. (Patient not taking: Reported on 08/30/2022)     No current facility-administered medications for this visit.    Allergies as of 10/02/2022 - Review Complete 08/30/2022  Allergen Reaction Noted   Acetaminophen  05/29/2022   Advil [ibuprofen] Diarrhea 07/20/2014   Aleve [naproxen sodium] Nausea And Vomiting 04/23/2011    Family History  Problem Relation Age of Onset   Diabetes Mother    Alzheimer's disease Mother    Diabetes Sister    Colon  cancer Neg Hx    Liver disease Neg Hx     Social History   Socioeconomic History   Marital status: Single    Spouse name: Not on file   Number of children: Not on file   Years of education: Not on file   Highest education level: Not on file  Occupational History   Not on file  Tobacco Use   Smoking status: Every Day    Packs/day: 0.25    Years: 45.00    Additional pack years: 0.00    Total pack years: 11.25    Types: Cigarettes   Smokeless tobacco: Never  Vaping Use   Vaping Use: Never used  Substance and Sexual Activity   Alcohol use: Yes    Alcohol/week: 9.0 standard drinks of alcohol    Types: 9 Cans of beer per week    Comment: occ beer   Drug use: Yes    Types: Marijuana    Comment: last marijuana was 1 month ago   Sexual activity: Not on file  Other Topics Concern   Not on file  Social History Narrative   Not on file   Social Determinants of Health   Financial Resource Strain: Not on file  Food Insecurity: No Food Insecurity (08/02/2022)   Hunger Vital Sign    Worried About Running Out of Food in the Last Year: Never true    Ran Out of Food in the Last Year: Never true  Transportation Needs: No Transportation Needs (08/02/2022)   PRAPARE - Hydrologist (Medical): No    Lack of Transportation (Non-Medical): No  Physical Activity: Not on file  Stress: Not on file  Social Connections: Not on file    Review of Systems: Gen: Denies fever, chills, anorexia. Denies fatigue, weakness, weight loss.  CV: Denies chest pain, palpitations, syncope, peripheral edema, and claudication. Resp: Denies dyspnea at rest, cough, wheezing, coughing up blood, and pleurisy. GI: Denies vomiting blood, jaundice, and fecal incontinence.   Denies dysphagia or odynophagia. Derm: Denies rash, itching, dry skin Psych: Denies depression, anxiety, memory loss, confusion. No homicidal or suicidal ideation.  Heme: Denies bruising, bleeding, and enlarged lymph  nodes.  Physical Exam: There were no vitals taken for this visit. General:   Alert and oriented. No distress noted. Pleasant and cooperative.  Head:  Normocephalic and atraumatic. Eyes:  Conjuctiva clear without scleral icterus. Heart:  S1, S2 present without murmurs appreciated. Lungs:  Clear to auscultation bilaterally. No wheezes, rales, or rhonchi. No distress.  Abdomen:  +BS, soft, non-tender and non-distended. No rebound or guarding. No HSM or masses noted. Msk:  Symmetrical without gross deformities. Normal posture. Extremities:  Without edema. Neurologic:  Alert and  oriented x4 Psych:  Normal mood and affect.    Assessment:     Plan:  ***   Aliene Altes, PA-C [facility] 10/02/2022

## 2022-10-02 ENCOUNTER — Ambulatory Visit: Payer: Medicaid Other | Admitting: Gastroenterology

## 2022-10-04 ENCOUNTER — Encounter: Payer: Self-pay | Admitting: Gastroenterology

## 2022-10-30 ENCOUNTER — Ambulatory Visit (INDEPENDENT_AMBULATORY_CARE_PROVIDER_SITE_OTHER): Payer: Medicaid Other | Admitting: Primary Care

## 2022-10-30 ENCOUNTER — Ambulatory Visit (INDEPENDENT_AMBULATORY_CARE_PROVIDER_SITE_OTHER): Payer: Medicaid Other

## 2022-10-30 ENCOUNTER — Encounter: Payer: Self-pay | Admitting: Primary Care

## 2022-10-30 VITALS — BP 112/82 | HR 96 | Ht 75.0 in | Wt 194.6 lb

## 2022-10-30 DIAGNOSIS — Z01811 Encounter for preprocedural respiratory examination: Secondary | ICD-10-CM | POA: Diagnosis not present

## 2022-10-30 DIAGNOSIS — J9611 Chronic respiratory failure with hypoxia: Secondary | ICD-10-CM | POA: Diagnosis not present

## 2022-10-30 DIAGNOSIS — J449 Chronic obstructive pulmonary disease, unspecified: Secondary | ICD-10-CM

## 2022-10-30 NOTE — Addendum Note (Signed)
Addended by: Lanna Poche on: 10/30/2022 10:42 AM   Modules accepted: Orders

## 2022-10-30 NOTE — Assessment & Plan Note (Signed)
-   Resolved; No longer requiring supplement oxygen. O2 96% RA.

## 2022-10-30 NOTE — Assessment & Plan Note (Signed)
-   Patient is followed by our office for COPD.  He has had no recent exacerbations of his COPD symptoms.  Not currently on maintenance inhaler but re-started. Exam today was benign. He needs preop chest x-ray and spirometry prior to clearance for back surgery.

## 2022-10-30 NOTE — Assessment & Plan Note (Addendum)
-   Stable; No acute respiratory issues today or recent exacerbations. He has chronic bronchitic changes on chest imaging. Compliance remains on going issue due to medication cost and transportation issues. He is not currently on maintenance inhaler. He did noticed improvement while taking Trelegy. We will provide him a sample today and re-send prescription/ likely needs PA. Previously tried Incruse and Anoro without improvement. We will get routine CXR and needs spirometry for pre-op clearance. Advised he take mucinex 600mg  twice daily as needed for chest congestion/cough. FU in 3 months with Dr. Delton Coombes of sooner if needed.

## 2022-10-30 NOTE — Progress Notes (Addendum)
@Patient  ID: Sean Thomas, male    DOB: [birth year], 62 y.o.   MRN: [id number]  Chief Complaint  Patient presents with   Follow-up    Referring provider: No ref. provider found  HPI: 62 year old male, current everyday smoker.  Medical history significant for COPD, chronic respiratory failure with hypoxia, right pneumothorax, hypertension, dysphagia, iron deficiency anemia, chronic back pain, hyperlipidemia.  Patient of Dr. Delton Coombes.  Previous [facility] pulmonary encounter:  03/01/2022 Patient presents today for overdue follow-up. He was last seen in May 2021. He was seen in ED on 02/27/22 for evaluation of shortness of breath. CXR showed no acute process. Cbc showed elevated white blood cell count of 16.3, however, patient was not febrile or tachycardia. Treated with solumedrol, duoneb. Discharged on RX zpack and prednisone course.   His breathing is some better since he found a box of nebulizer's at home. He has been using ipratropium-albuterol every 2-3 hours. He did not get Zpack/prednisone prescription when discharged from ED.  He was using Trelegy up until this month but could not get prescription refilled. He was also previously on daily prednisone and states that he did well while taking that and is needing refill. He has a productive cough with purulent mucus. O2 levels have not dropped below 94% at home. He is no longer on oxygen. He is still smoking 2 cigarettes a day. Transportation is an ongoing issue for him.    10/30/2022 Patient presents today for surgical risk assessment.  Is followed by Dr. Delton Coombes for COPD and chronic respiratory failure.  He is having a significant amount of back pain radiating down right leg. Planning to have back surgery with [facility] neurosurgery. Medicaid denied surgery which was scheduled for 11/10/22.   Breathing has been alright. He denies having a regular cough. No associated wheezing or chest tightness. He has been without Trelegy . He uses  ipratropium-albuterol twice a week. He does not get out of breath with short distances. He uses cart when grocery shopping due to back pain. He is still smoking 2-3 cigarettes a day. No longer wearing oxygen. PFTs in 2020 showed moderate obstructive lung disease. CXR in January 2024 showed chronic bronchitic changes.   TEST/EVENTS :  Spirometry June 2015 FEV1 41%, ratio 58, FVC 56%  03/2019  FEV1 64%, ratio 69, FVC 70%, DLCO 56%.  No significant bronchodilator response. Postbronchodilator FEV1 70%, ratio 70, FVC 75%, mid flow reversibility.  Imaging: CXR in January 2024 >> showed mild cardiac enlargement.  Bronchiectasis with peribronchial thickening and central into distal changes likely chronic bronchitis.  Moderate-sized esophageal hiatal hernia.      Allergies  Allergen Reactions   Acetaminophen     Other Reaction(s): Other (See Comments)   Advil [Ibuprofen] Diarrhea   Aleve [Naproxen Sodium] Nausea And Vomiting    diarrhea    Immunization History  Administered Date(s) Administered   Influenza Inj Mdck Quad Pf 05/25/2021   Influenza Split 03/28/2016, 04/16/2018   Influenza,inj,Quad PF,6+ Mos 03/21/2019   Influenza-Unspecified 05/18/2014, 03/28/2016, 04/16/2018, 03/21/2019   Pneumococcal Conjugate-13 02/28/2018   Pneumococcal Polysaccharide-23 02/29/2016   Pneumococcal-Unspecified 02/29/2016   Tdap 10/28/2012    Past Medical History:  Diagnosis Date   Candida esophagitis    12/2019; 07/2022   Cervical pain 11/21/2012   COPD (chronic obstructive pulmonary disease)    DDD (degenerative disc disease)    Esophageal dysphagia    Essential hypertension    Fracture of multiple ribs 11/01/2012   Fracture of occipital condyle 11/21/2012  Headache    Hyperlipidemia    Inguinal hernia 12/19/2013   Microcytic anemia    N&V (nausea and vomiting) 05/23/2016    Tobacco History: Social History   Tobacco Use  Smoking Status Every Day   Packs/day: 0.25   Years: 45.00    Additional pack years: 0.00   Total pack years: 11.25   Types: Cigarettes  Smokeless Tobacco Never   Ready to quit: Not Answered Counseling given: Not Answered   Outpatient Medications Prior to Visit  Medication Sig Dispense Refill   ferrous sulfate 325 (65 FE) MG EC tablet Take 1 tablet (325 mg total) by mouth daily with breakfast. 30 tablet 3   fluconazole (DIFLUCAN) 100 MG tablet Take 4 tablets (400 mg total) on day 1, then take 2 tablets (200 mg total) every day thereafter for a total of 21 days. 44 tablet 0   Fluticasone-Umeclidin-Vilant (TRELEGY ELLIPTA) 100-62.5-25 MCG/INH AEPB Inhale 1 puff into the lungs daily. 28 each 0   gabapentin (NEURONTIN) 600 MG tablet Take 600 mg by mouth 3 (three) times daily.     pantoprazole (PROTONIX) 40 MG tablet Take 1 tablet (40 mg total) by mouth 2 (two) times daily before a meal. 60 tablet 11   predniSONE (DELTASONE) 20 MG tablet Take 2 tablets (40 mg total) by mouth daily with breakfast. 8 tablet 0   QUEtiapine (SEROQUEL) 50 MG tablet Take 50 mg by mouth at bedtime.     No facility-administered medications prior to visit.   Review of Systems  Review of Systems  Constitutional: Negative.   HENT: Negative.    Respiratory:  Positive for cough. Negative for chest tightness, shortness of breath and wheezing.        Rare cough; Mild dyspnea with exertion  Cardiovascular: Negative.    Physical Exam  BP 112/82 (BP Location: Left Arm, Patient Position: Sitting, Cuff Size: Normal)   Pulse 96   Ht 6\' 3"  (1.905 m)   Wt 194 lb 9.6 oz (88.3 kg)   SpO2 96%   BMI 24.32 kg/m  Physical Exam Constitutional:      Appearance: Normal appearance.  HENT:     Head: Normocephalic and atraumatic.     Mouth/Throat:     Mouth: Mucous membranes are moist.     Pharynx: Oropharynx is clear.  Cardiovascular:     Rate and Rhythm: Normal rate and regular rhythm.  Pulmonary:     Effort: Pulmonary effort is normal.     Breath sounds: Normal breath sounds. No  wheezing.     Comments: Few isolated/distant rales otherwise clear  Neurological:     General: No focal deficit present.     Mental Status: He is alert and oriented to person, place, and time. Mental status is at baseline.  Psychiatric:        Mood and Affect: Mood normal.        Behavior: Behavior normal.        Thought Content: Thought content normal.        Judgment: Judgment normal.      Lab Results:  CBC    Component Value Date/Time   WBC 13.5 (H) 08/03/2022 0326   RBC 3.74 (L) 08/03/2022 0326   HGB 7.4 (L) 08/03/2022 0326   HCT 26.0 (L) 08/03/2022 0326   PLT 381 08/03/2022 0326   MCV 69.5 (L) 08/03/2022 0326   MCH 19.8 (L) 08/03/2022 0326   MCHC 28.5 (L) 08/03/2022 0326   RDW 25.9 (H) 08/03/2022 1610  LYMPHSABS 4.4 (H) 02/16/2022 0235   MONOABS 0.8 02/16/2022 0235   EOSABS 1.0 (H) 02/16/2022 0235   BASOSABS 0.1 02/16/2022 0235    BMET    Component Value Date/Time   NA 141 08/03/2022 0326   K 3.9 08/03/2022 0326   CL 108 08/03/2022 0326   CO2 28 08/03/2022 0326   GLUCOSE 98 08/03/2022 0326   BUN 9 08/03/2022 0326   CREATININE 0.93 08/03/2022 0326   CALCIUM 8.1 (L) 08/03/2022 0326   GFRNONAA >60 08/03/2022 0326   GFRAA >60 11/02/2016 0559    BNP    Component Value Date/Time   BNP 28.0 02/16/2022 0235    ProBNP No results found for: "PROBNP"  Imaging: No results found.   Assessment & Plan:   COPD (chronic obstructive pulmonary disease) (HCC) - Stable; No acute respiratory issues today or recent exacerbations. He has chronic bronchitic changes on chest imaging. Compliance remains on going issue due to medication cost and transportation issues. He is not currently on maintenance inhaler. He did noticed improvement while taking Trelegy. We will provide him a sample today and re-send prescription/ likely needs PA. Previously tried Incruse and Anoro without improvement. We will get routine CXR and needs spirometry for pre-op clearance. Advised he take  mucinex 600mg  twice daily as needed for chest congestion/cough. FU in 3 months with Dr. Delton Coombes of sooner if needed.   Pre-operative respiratory examination - Patient is followed by our office for COPD.  He has had no recent exacerbations of his COPD symptoms.  Not currently on maintenance inhaler but re-started. Exam today was benign. He needs preop chest x-ray and spirometry prior to clearance for back surgery.   Chronic respiratory failure with hypoxia (HCC) - Resolved; No longer requiring supplement oxygen. O2 96% RA.    Glenford Bayley, NP 10/30/2022

## 2022-10-30 NOTE — Patient Instructions (Addendum)
Recommendations: - Resume Trelegy -1 puff daily every morning (rinse mouth after use) - Use Ipratropium-Albuterol nebulizer every 4-6 hours as needed for breakthrough shortness of breath or wheezing - Take over the counter regular Mucinex 600 mg twice daily as needed for chest congestion/cough  Orders: - Chest x-ray re: preop (ordered) - 30 minutes spirometry in [place] re: preop  Follow-up:  - 3 months with Dr. Delton Coombes

## 2022-11-02 NOTE — Progress Notes (Signed)
Please let patient know CXR showed no active cardiopulmonary disease, small-moderate hiatal hernia. This was for pre-op clearance. Still needs spirometry before I can give ok for him to have surgery.

## 2022-11-10 ENCOUNTER — Ambulatory Visit: Payer: Medicaid Other | Admitting: Gastroenterology

## 2022-11-10 ENCOUNTER — Ambulatory Visit (HOSPITAL_COMMUNITY)
Admission: RE | Admit: 2022-11-10 | Discharge: 2022-11-10 | Disposition: A | Discharge status: Home / Self Care | Payer: Medicaid Other | Source: Ambulatory Visit | Attending: Emergency Medicine | Admitting: Emergency Medicine

## 2022-11-10 DIAGNOSIS — J438 Other emphysema: Secondary | ICD-10-CM | POA: Insufficient documentation

## 2022-11-10 LAB — PULMONARY FUNCTION TEST
FEF 25-75 Post: 1.95 L/sec
FEF 25-75 Pre: 1.52 L/sec
FEF2575-%Change-Post: 28 %
FEF2575-%Pred-Post: 61 %
FEF2575-%Pred-Pre: 48 %
FEV1-%Change-Post: 3 %
FEV1-%Pred-Post: 69 %
FEV1-%Pred-Pre: 67 %
FEV1-Post: 2.8 L
FEV1-Pre: 2.7 L
FEV1FVC-%Change-Post: 4 %
FEV1FVC-%Pred-Pre: 90 %
FEV6-%Change-Post: -1 %
FEV6-%Pred-Post: 75 %
FEV6-%Pred-Pre: 77 %
FEV6-Post: 3.85 L
FEV6-Pre: 3.91 L
FEV6FVC-%Change-Post: 0 %
FEV6FVC-%Pred-Post: 102 %
FEV6FVC-%Pred-Pre: 103 %
FVC-%Change-Post: 0 %
FVC-%Pred-Post: 74 %
FVC-%Pred-Pre: 74 %
FVC-Post: 3.95 L
FVC-Pre: 3.98 L
Post FEV1/FVC ratio: 71 %
Post FEV6/FVC ratio: 98 %
Pre FEV1/FVC ratio: 68 %
Pre FEV6/FVC Ratio: 98 %

## 2022-11-10 MED ORDER — ALBUTEROL SULFATE (2.5 MG/3ML) 0.083% IN NEBU
2.5000 mg | INHALATION_SOLUTION | Freq: Once | RESPIRATORY_TRACT | Status: AC
  Administered 2022-11-10: 2.5 mg via RESPIRATORY_TRACT

## 2022-11-13 NOTE — Progress Notes (Unsigned)
Referring Provider: No ref. provider found Primary Care Physician:  Pcp, No Primary GI Physician: Dr. Jena Gauss  No chief complaint on file.   HPI:   Sean Thomas is a 62 y.o. male presenting today with a history of COPD, HTN, HLD, IDA, dysphagia, Candida esophagitis June 2021 s/p treatment with Diflucan, hiatal hernia with Sheria Lang lesion June 2021, adenomatous colon polyps. Hospitalized in January 2021 with anemia, found to have nonsevere Candida esophagitis, esophageal ulcer, 3 centimeter hiatal hernia with a few small Cameron erosions, colonoscopy with diverticulosis, otherwise normal exam. He is  presenting today for follow-up.    Last seen in our office 08/30/2022.  He had not been treated for Candida esophagitis and was started on Diflucan.  He continued to use BCs and Aleve for chronic back pain and stated he was not going to stop.  He did have plans to see [facility] in April for consideration of surgery.  Was compliant with PPI twice daily.  Patient..  Whether this was secondary to persistent Candida esophagitis.  Plan to monitor for now.  Recommended starting on follow-up for IDA.  Plan to recheck labs at follow-up.   Today:   IDA:   Dysphagia:   Candida esophagitis:   GERD:   Past Medical History:  Diagnosis Date   Candida esophagitis (HCC)    12/2019; 07/2022   Cervical pain 11/21/2012   COPD (chronic obstructive pulmonary disease) (HCC)    DDD (degenerative disc disease)    Esophageal dysphagia    Essential hypertension    Fracture of multiple ribs 11/01/2012   Fracture of occipital condyle (HCC) 11/21/2012   Headache    Hyperlipidemia    Inguinal hernia 12/19/2013   Microcytic anemia    N&V (nausea and vomiting) 05/23/2016    Past Surgical History:  Procedure Laterality Date   BACK SURGERY     COLONOSCOPY WITH PROPOFOL N/A 06/15/2016   RMR: inadequate bowel prep all mucosal surfaces not well seen. 5 mm tubular adenoma removed from the hepatic  flexure. 3 mm polyp from the rectum was hyperplastic. He had scattered diverticula   COLONOSCOPY WITH PROPOFOL N/A 08/03/2022   Surgeon: Lanelle Bal, DO; diverticulosis in the sigmoid colon, otherwise normal exam.   ESOPHAGEAL BRUSHING  12/29/2019   Procedure: ESOPHAGEAL BRUSHING;  Surgeon: Corbin Ade, MD;  Location: [facility];  Service: Endoscopy;;   ESOPHAGOGASTRODUODENOSCOPY N/A 08/03/2022   Surgeon: Lanelle Bal, DO; nonsevere Candida esophagitis, esophageal ulcer, 3 centimeter hiatal hernia with a few small Cameron erosions.   ESOPHAGOGASTRODUODENOSCOPY (EGD) WITH PROPOFOL N/A 06/15/2016   Dr. Jena Gauss: Large hiatal hernia, reflux esophagitis, Schatzki ring with focal area of ulceration, status post dilation with 67F Maloney dilator with moderate improvement in luminal narrowing.   ESOPHAGOGASTRODUODENOSCOPY (EGD) WITH PROPOFOL N/A 12/29/2019   Surgeon: Corbin Ade, MD; Candida esophagitis, Schatzki ring status post dilation, medium sized hiatal hernia with a single Cameron lesion.  KOH positive, treated with Diflucan for 21 days.   ESOPHAGOGASTRODUODENOSCOPY (EGD) WITH PROPOFOL N/A 02/22/2022   Surgeon: Corbin Ade, MD; Moderate sized hiatal hernia with Sheria Lang erosions, Schatzki's ring/peptic stricture with ulcer reflux esophagitis s/p dilation   INGUINAL HERNIA REPAIR Left 07/24/2014   Procedure: LAPAROSCOPIC LEFT INGUINAL HERNIA REPAIR ;  Surgeon: Axel Filler, MD;  Location: [facility] OR;  Service: General;  Laterality: Left;   INGUINAL HERNIA REPAIR Right 08/17/2021   Procedure: HERNIA REPAIR INGUINAL ADULT W/ MESH;  Surgeon: Franky Macho, MD;  Location: [facility]  ORS;  Service: General;  Laterality: Right;   INSERTION OF MESH Left 07/24/2014   Procedure: INSERTION OF MESH;  Surgeon: Axel Filler, MD;  Location: [facility] OR;  Service: General;  Laterality: Left;   LUMBAR FUSION     MALONEY DILATION N/A 06/15/2016   Procedure: Elease Hashimoto DILATION;  Surgeon: Corbin Ade, MD;   Location: [facility];  Service: Endoscopy;  Laterality: N/A;   MALONEY DILATION N/A 12/29/2019   Procedure: Elease Hashimoto DILATION;  Surgeon: Corbin Ade, MD;  Location: [facility];  Service: Endoscopy;  Laterality: N/A;   MALONEY DILATION N/A 02/22/2022   Procedure: Elease Hashimoto DILATION;  Surgeon: Corbin Ade, MD;  Location: [facility];  Service: Endoscopy;  Laterality: N/A;   POLYPECTOMY  06/15/2016   Procedure: POLYPECTOMY;  Surgeon: Corbin Ade, MD;  Location: [facility];  Service: Endoscopy;;  colon   right lung surgery Right     Current Outpatient Medications  Medication Sig Dispense Refill   ferrous sulfate 325 (65 FE) MG EC tablet Take 1 tablet (325 mg total) by mouth daily with breakfast. 30 tablet 3   fluconazole (DIFLUCAN) 100 MG tablet Take 4 tablets (400 mg total) on day 1, then take 2 tablets (200 mg total) every day thereafter for a total of 21 days. 44 tablet 0   Fluticasone-Umeclidin-Vilant (TRELEGY ELLIPTA) 100-62.5-25 MCG/INH AEPB Inhale 1 puff into the lungs daily. 28 each 0   gabapentin (NEURONTIN) 600 MG tablet Take 600 mg by mouth 3 (three) times daily.     pantoprazole (PROTONIX) 40 MG tablet Take 1 tablet (40 mg total) by mouth 2 (two) times daily before a meal. 60 tablet 11   predniSONE (DELTASONE) 20 MG tablet Take 2 tablets (40 mg total) by mouth daily with breakfast. 8 tablet 0   QUEtiapine (SEROQUEL) 50 MG tablet Take 50 mg by mouth at bedtime.     No current facility-administered medications for this visit.    Allergies as of 11/15/2022 - Reviewed 11/10/2022  Allergen Reaction Noted   Acetaminophen  05/29/2022   Advil [ibuprofen] Diarrhea 07/20/2014   Aleve [naproxen sodium] Nausea And Vomiting 04/23/2011    Family History  Problem Relation Age of Onset   Diabetes Mother    Alzheimer's disease Mother    Diabetes Sister    Colon cancer Neg Hx    Liver disease Neg Hx     Social History   Socioeconomic History   Marital status:  Single    Spouse name: Not on file   Number of children: Not on file   Years of education: Not on file   Highest education level: Not on file  Occupational History   Not on file  Tobacco Use   Smoking status: Every Day    Packs/day: 0.25    Years: 45.00    Additional pack years: 0.00    Total pack years: 11.25    Types: Cigarettes   Smokeless tobacco: Never  Vaping Use   Vaping Use: Never used  Substance and Sexual Activity   Alcohol use: Yes    Alcohol/week: 9.0 standard drinks of alcohol    Types: 9 Cans of beer per week    Comment: occ beer   Drug use: Yes    Types: Marijuana    Comment: last marijuana was 1 month ago   Sexual activity: Not on file  Other Topics Concern   Not on file  Social History Narrative   Not on file   Social  Determinants of Health   Financial Resource Strain: Not on file  Food Insecurity: No Food Insecurity (08/02/2022)   Hunger Vital Sign    Worried About Running Out of Food in the Last Year: Never true    Ran Out of Food in the Last Year: Never true  Transportation Needs: No Transportation Needs (08/02/2022)   PRAPARE - Administrator, Civil Service (Medical): No    Lack of Transportation (Non-Medical): No  Physical Activity: Not on file  Stress: Not on file  Social Connections: Not on file    Review of Systems: Gen: Denies fever, chills, anorexia. Denies fatigue, weakness, weight loss.  CV: Denies chest pain, palpitations, syncope, peripheral edema, and claudication. Resp: Denies dyspnea at rest, cough, wheezing, coughing up blood, and pleurisy. GI: Denies vomiting blood, jaundice, and fecal incontinence.   Denies dysphagia or odynophagia. Derm: Denies rash, itching, dry skin Psych: Denies depression, anxiety, memory loss, confusion. No homicidal or suicidal ideation.  Heme: Denies bruising, bleeding, and enlarged lymph nodes.  Physical Exam: There were no vitals taken for this visit. General:   Alert and oriented. No  distress noted. Pleasant and cooperative.  Head:  Normocephalic and atraumatic. Eyes:  Conjuctiva clear without scleral icterus. Heart:  S1, S2 present without murmurs appreciated. Lungs:  Clear to auscultation bilaterally. No wheezes, rales, or rhonchi. No distress.  Abdomen:  +BS, soft, non-tender and non-distended. No rebound or guarding. No HSM or masses noted. Msk:  Symmetrical without gross deformities. Normal posture. Extremities:  Without edema. Neurologic:  Alert and  oriented x4 Psych:  Normal mood and affect.    Assessment:     Plan:  ***   Ermalinda Memos, PA-C [facility] 11/15/2022

## 2022-11-15 ENCOUNTER — Encounter: Payer: Self-pay | Admitting: *Deleted

## 2022-11-15 ENCOUNTER — Encounter: Payer: Self-pay | Admitting: Gastroenterology

## 2022-11-15 ENCOUNTER — Ambulatory Visit (INDEPENDENT_AMBULATORY_CARE_PROVIDER_SITE_OTHER): Payer: Medicaid Other | Admitting: Gastroenterology

## 2022-11-15 VITALS — BP 149/87 | HR 105 | Temp 97.5°F | Ht 75.0 in | Wt 187.0 lb

## 2022-11-15 DIAGNOSIS — K219 Gastro-esophageal reflux disease without esophagitis: Secondary | ICD-10-CM

## 2022-11-15 DIAGNOSIS — R1310 Dysphagia, unspecified: Secondary | ICD-10-CM

## 2022-11-15 DIAGNOSIS — K921 Melena: Secondary | ICD-10-CM

## 2022-11-15 NOTE — Patient Instructions (Addendum)
Please have blood work completed at [facility].  Take your pantoprazole 40 mg twice daily 30 minutes before breakfast and dinner.  Stop BCs, Goody's, Advil, and avoid all other NSAIDs including ibuprofen, Aleve.  Use Tylenol first for pain.  No more than 3000 mg of Tylenol per 24 hours.  We will arrange for you to have a an upper endoscopy with possible stretching of your esophagus in the near future with Dr. Jena Gauss.  If you develop fatigue, lightheadedness, dizziness, weakness, shortness of breath, chest pain, proceed to the emergency room.  Ermalinda Memos, PA-C [facility]

## 2022-11-16 ENCOUNTER — Encounter: Payer: Self-pay | Admitting: *Deleted

## 2022-12-13 NOTE — Patient Instructions (Signed)
20    Your procedure is scheduled on: 12/20/2022  Report to [facility] Main Entrance at   9:30  AM.  Call this number if you have problems the morning of surgery: [phone]   Remember:   Follow instructions on letter from office regarding when to stop eating and drinking        No Smoking the day of procedure      Take these medicines the morning of surgery with A SIP OF WATER: Pantoprazole and gabapentin   Do not wear jewelry, make-up or nail polish.  Do not wear lotions, powders, or perfumes. You may wear deodorant.                Do not bring valuables to the hospital.  Contacts, dentures or bridgework may not be worn into surgery.  Leave suitcase in the car. After surgery it may be brought to your room.  For patients admitted to the hospital, checkout time is 11:00 AM the day of discharge.   Patients discharged the day of surgery will not be allowed to drive home. Upper Endoscopy, Adult Upper endoscopy is a procedure to look inside the upper GI (gastrointestinal) tract. The upper GI tract is made up of: The part of the body that moves food from your mouth to your stomach (esophagus). The stomach. The first part of your small intestine (duodenum). This procedure is also called esophagogastroduodenoscopy (EGD) or gastroscopy. In this procedure, your health care provider passes a thin, flexible tube (endoscope) through your mouth and down your esophagus into your stomach. A small camera is attached to the end of the tube. Images from the camera appear on a monitor in the exam room. During this procedure, your health care provider may also remove a small piece of tissue to be sent to a lab and examined under a microscope (biopsy). Your health care provider may do an upper endoscopy to diagnose cancers of the upper GI tract. You may also have this procedure to find the cause of other conditions, such as: Stomach pain. Heartburn. Pain or problems when swallowing. Nausea and  vomiting. Stomach bleeding. Stomach ulcers. Tell a health care provider about: Any allergies you have. All medicines you are taking, including vitamins, herbs, eye drops, creams, and over-the-counter medicines. Any problems you or family members have had with anesthetic medicines. Any blood disorders you have. Any surgeries you have had. Any medical conditions you have. Whether you are pregnant or may be pregnant. What are the risks? Generally, this is a safe procedure. However, problems may occur, including: Infection. Bleeding. Allergic reactions to medicines. A tear or hole (perforation) in the esophagus, stomach, or duodenum. What happens before the procedure? Staying hydrated Follow instructions from your health care provider about hydration, which may include: Up to 4 hours before the procedure - you may continue to drink clear liquids, such as water, clear fruit juice, black coffee, and plain tea.   Medicines Ask your health care provider about: Changing or stopping your regular medicines. This is especially important if you are taking diabetes medicines or blood thinners. Taking medicines such as aspirin and ibuprofen. These medicines can thin your blood. Do not take these medicines unless your health care provider tells you to take them. Taking over-the-counter medicines, vitamins, herbs, and supplements. General instructions Plan to have someone take you home from the hospital or clinic. If you will be going home right after the procedure, plan to have someone with you for 24 hours. Ask your  health care provider what steps will be taken to help prevent infection. What happens during the procedure?  An IV will be inserted into one of your veins. You may be given one or more of the following: A medicine to help you relax (sedative). A medicine to numb the throat (local anesthetic). You will lie on your left side on an exam table. Your health care provider will pass the  endoscope through your mouth and down your esophagus. Your health care provider will use the scope to check the inside of your esophagus, stomach, and duodenum. Biopsies may be taken. The endoscope will be removed. The procedure may vary among health care providers and hospitals. What happens after the procedure? Your blood pressure, heart rate, breathing rate, and blood oxygen level will be monitored until you leave the hospital or clinic. Do not drive for 24 hours if you were given a sedative during your procedure. When your throat is no longer numb, you may be given some fluids to drink. It is up to you to get the results of your procedure. Ask your health care provider, or the department that is doing the procedure, when your results will be ready. Summary Upper endoscopy is a procedure to look inside the upper GI tract. During the procedure, an IV will be inserted into one of your veins. You may be given a medicine to help you relax. A medicine will be used to numb your throat. The endoscope will be passed through your mouth and down your esophagus. This information is not intended to replace advice given to you by your health care provider. Make sure you discuss any questions you have with your health care provider. Document Revised: 12/26/2017 Document Reviewed: 12/03/2017 Elsevier Patient Education  2020 [employer]                                                                                                                                      [facility] After  Please read the instructions outlined below and refer to this sheet in the next few weeks. These discharge instructions provide you with general information on caring for yourself after you leave the hospital. Your doctor may also give you specific instructions. While your treatment has been planned according to the most current medical practices available, unavoidable complications occasionally occur. If you have any  problems or questions after discharge, please call your doctor. HOME CARE INSTRUCTIONS Activity You may resume your regular activity but move at a slower pace for the next 24 hours.  Take frequent rest periods for the next 24 hours.  Walking will help expel (get rid of) the air and reduce the bloated feeling in your abdomen.  No driving for 24 hours (because of the anesthesia (medicine) used during the test).  You may shower.  Do not sign any important legal documents or operate any machinery for 24 hours (because of the anesthesia used  during the test).  Nutrition Drink plenty of fluids.  You may resume your normal diet.  Begin with a light meal and progress to your normal diet.  Avoid alcoholic beverages for 24 hours or as instructed by your caregiver.  Medications You may resume your normal medications unless your caregiver tells you otherwise. What you can expect today You may experience abdominal discomfort such as a feeling of fullness or "gas" pains.  You may experience a sore throat for 2 to 3 days. This is normal. Gargling with salt water may help this.  Follow-up Your doctor will discuss the results of your test with you. SEEK IMMEDIATE MEDICAL CARE IF: You have excessive nausea (feeling sick to your stomach) and/or vomiting.  You have severe abdominal pain and distention (swelling).  You have trouble swallowing.  You have a temperature over 100 F (37.8 C).  You have rectal bleeding or vomiting of blood.  Document Released: 02/15/2004 Document Revised: 06/22/2011 Document Reviewed: 08/28/2007

## 2022-12-15 ENCOUNTER — Encounter (HOSPITAL_COMMUNITY)
Admission: RE | Admit: 2022-12-15 | Discharge: 2022-12-15 | Disposition: A | Discharge status: Home / Self Care | Payer: Medicaid Other | Source: Ambulatory Visit | Attending: Internal Medicine | Admitting: Internal Medicine

## 2022-12-15 ENCOUNTER — Encounter (HOSPITAL_COMMUNITY): Payer: Self-pay

## 2022-12-15 VITALS — BP 137/78 | HR 48 | Temp 97.6°F | Resp 18 | Ht 75.0 in | Wt 186.9 lb

## 2022-12-15 DIAGNOSIS — D62 Acute posthemorrhagic anemia: Secondary | ICD-10-CM | POA: Diagnosis not present

## 2022-12-15 DIAGNOSIS — Z01818 Encounter for other preprocedural examination: Secondary | ICD-10-CM | POA: Diagnosis present

## 2022-12-15 HISTORY — DX: Gastro-esophageal reflux disease without esophagitis: K21.9

## 2022-12-15 HISTORY — DX: Depression, unspecified: F32.A

## 2022-12-15 LAB — CBC WITH DIFFERENTIAL/PLATELET
Abs Immature Granulocytes: 0.03 10*3/uL (ref 0.00–0.07)
Basophils Absolute: 0.1 10*3/uL (ref 0.0–0.1)
Basophils Relative: 1 %
Eosinophils Absolute: 0.4 10*3/uL (ref 0.0–0.5)
Eosinophils Relative: 4 %
HCT: 32.5 % — ABNORMAL LOW (ref 39.0–52.0)
Hemoglobin: 9 g/dL — ABNORMAL LOW (ref 13.0–17.0)
Immature Granulocytes: 0 %
Lymphocytes Relative: 49 %
Lymphs Abs: 5.5 10*3/uL — ABNORMAL HIGH (ref 0.7–4.0)
MCH: 20.6 pg — ABNORMAL LOW (ref 26.0–34.0)
MCHC: 27.7 g/dL — ABNORMAL LOW (ref 30.0–36.0)
MCV: 74.5 fL — ABNORMAL LOW (ref 80.0–100.0)
Monocytes Absolute: 1.1 10*3/uL — ABNORMAL HIGH (ref 0.1–1.0)
Monocytes Relative: 11 %
Neutro Abs: 3.7 10*3/uL (ref 1.7–7.7)
Neutrophils Relative %: 35 %
Platelets: 427 10*3/uL — ABNORMAL HIGH (ref 150–400)
RBC: 4.36 MIL/uL (ref 4.22–5.81)
RDW: 23.1 % — ABNORMAL HIGH (ref 11.5–15.5)
WBC: 10.8 10*3/uL — ABNORMAL HIGH (ref 4.0–10.5)
nRBC: 0 % (ref 0.0–0.2)

## 2022-12-20 ENCOUNTER — Encounter (HOSPITAL_COMMUNITY): Payer: Self-pay | Admitting: Internal Medicine

## 2022-12-20 ENCOUNTER — Telehealth: Payer: Self-pay | Admitting: *Deleted

## 2022-12-20 ENCOUNTER — Encounter: Payer: Self-pay | Admitting: *Deleted

## 2022-12-20 ENCOUNTER — Ambulatory Visit (HOSPITAL_COMMUNITY)
Admission: RE | Admit: 2022-12-20 | Discharge: 2022-12-20 | Disposition: A | Discharge status: Home / Self Care | Payer: Medicaid Other | Attending: Internal Medicine | Admitting: Internal Medicine

## 2022-12-20 ENCOUNTER — Encounter (HOSPITAL_COMMUNITY)
Admission: RE | Disposition: A | Discharge status: Home / Self Care | Payer: Self-pay | Source: Home / Self Care | Attending: Internal Medicine

## 2022-12-20 DIAGNOSIS — K219 Gastro-esophageal reflux disease without esophagitis: Secondary | ICD-10-CM | POA: Diagnosis present

## 2022-12-20 DIAGNOSIS — F14129 Cocaine abuse with intoxication, unspecified: Secondary | ICD-10-CM | POA: Insufficient documentation

## 2022-12-20 DIAGNOSIS — Z01818 Encounter for other preprocedural examination: Secondary | ICD-10-CM

## 2022-12-20 DIAGNOSIS — Z539 Procedure and treatment not carried out, unspecified reason: Secondary | ICD-10-CM | POA: Insufficient documentation

## 2022-12-20 LAB — RAPID URINE DRUG SCREEN, HOSP PERFORMED
Amphetamines: NOT DETECTED
Barbiturates: NOT DETECTED
Benzodiazepines: NOT DETECTED
Cocaine: POSITIVE — AB
Opiates: NOT DETECTED
Tetrahydrocannabinol: NOT DETECTED

## 2022-12-20 SURGERY — ESOPHAGOGASTRODUODENOSCOPY (EGD) WITH PROPOFOL
Anesthesia: Monitor Anesthesia Care

## 2022-12-20 MED ORDER — LACTATED RINGERS IV SOLN: INTRAVENOUS | Status: DC

## 2022-12-20 NOTE — Telephone Encounter (Signed)
Rourk, Gerrit Friends, MD  Armstead Peaks, CMA; Hal Neer; [place], [place] [address]  procedure canceled today.  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  He gets 1 more chance.  -----

## 2022-12-20 NOTE — Progress Notes (Signed)
Patient positive for cocaine today,12/20/22.  Case canceled, patient IV d/c, pressure dressing applied. Patient dressed and was d/c ambulatory to waiting room.

## 2022-12-20 NOTE — Telephone Encounter (Addendum)
Spoke with pt. He has been rescheduled to 7/3. Aware will send new instructions/pre-op appt. I made him aware he will need to stay clean off cocaine for him to have his procedure done. If he is positive he will be discharged from practice. He stated he has been doing cocaine for his pain but he stated to have procedure done he will stay off of it. Again stressed importance of not doing cocaine.

## 2023-01-12 NOTE — Patient Instructions (Signed)
20    Your procedure is scheduled on: 01/17/2023  Report to [facility] Main Entrance at   7:45  AM.  Call this number if you have problems the morning of surgery: [phone]   Remember:   Follow instructions on letter from office regarding when to stop eating and drinking        No Smoking the day of procedure      Take these medicines the morning of surgery with A SIP OF WATER: Gabapentin and prednisone  Use inhalers if needed   Do not wear jewelry, make-up or nail polish.  Do not wear lotions, powders, or perfumes. You may wear deodorant.                Do not bring valuables to the hospital.  Contacts, dentures or bridgework may not be worn into surgery.  Leave suitcase in the car. After surgery it may be brought to your room.  For patients admitted to the hospital, checkout time is 11:00 AM the day of discharge.   Patients discharged the day of surgery will not be allowed to drive home. Upper Endoscopy, Adult Upper endoscopy is a procedure to look inside the upper GI (gastrointestinal) tract. The upper GI tract is made up of: The part of the body that moves food from your mouth to your stomach (esophagus). The stomach. The first part of your small intestine (duodenum). This procedure is also called esophagogastroduodenoscopy (EGD) or gastroscopy. In this procedure, your health care provider passes a thin, flexible tube (endoscope) through your mouth and down your esophagus into your stomach. A small camera is attached to the end of the tube. Images from the camera appear on a monitor in the exam room. During this procedure, your health care provider may also remove a small piece of tissue to be sent to a lab and examined under a microscope (biopsy). Your health care provider may do an upper endoscopy to diagnose cancers of the upper GI tract. You may also have this procedure to find the cause of other conditions, such as: Stomach pain. Heartburn. Pain or problems when  swallowing. Nausea and vomiting. Stomach bleeding. Stomach ulcers. Tell a health care provider about: Any allergies you have. All medicines you are taking, including vitamins, herbs, eye drops, creams, and over-the-counter medicines. Any problems you or family members have had with anesthetic medicines. Any blood disorders you have. Any surgeries you have had. Any medical conditions you have. Whether you are pregnant or may be pregnant. What are the risks? Generally, this is a safe procedure. However, problems may occur, including: Infection. Bleeding. Allergic reactions to medicines. A tear or hole (perforation) in the esophagus, stomach, or duodenum. What happens before the procedure? Staying hydrated Follow instructions from your health care provider about hydration, which may include: Up to 4 hours before the procedure - you may continue to drink clear liquids, such as water, clear fruit juice, black coffee, and plain tea.   Medicines Ask your health care provider about: Changing or stopping your regular medicines. This is especially important if you are taking diabetes medicines or blood thinners. Taking medicines such as aspirin and ibuprofen. These medicines can thin your blood. Do not take these medicines unless your health care provider tells you to take them. Taking over-the-counter medicines, vitamins, herbs, and supplements. General instructions Plan to have someone take you home from the hospital or clinic. If you will be going home right after the procedure, plan to have someone with you  for 24 hours. Ask your health care provider what steps will be taken to help prevent infection. What happens during the procedure?  An IV will be inserted into one of your veins. You may be given one or more of the following: A medicine to help you relax (sedative). A medicine to numb the throat (local anesthetic). You will lie on your left side on an exam table. Your health care  provider will pass the endoscope through your mouth and down your esophagus. Your health care provider will use the scope to check the inside of your esophagus, stomach, and duodenum. Biopsies may be taken. The endoscope will be removed. The procedure may vary among health care providers and hospitals. What happens after the procedure? Your blood pressure, heart rate, breathing rate, and blood oxygen level will be monitored until you leave the hospital or clinic. Do not drive for 24 hours if you were given a sedative during your procedure. When your throat is no longer numb, you may be given some fluids to drink. It is up to you to get the results of your procedure. Ask your health care provider, or the department that is doing the procedure, when your results will be ready. Summary Upper endoscopy is a procedure to look inside the upper GI tract. During the procedure, an IV will be inserted into one of your veins. You may be given a medicine to help you relax. A medicine will be used to numb your throat. The endoscope will be passed through your mouth and down your esophagus. This information is not intended to replace advice given to you by your health care provider. Make sure you discuss any questions you have with your health care provider. Document Revised: 12/26/2017 Document Reviewed: 12/03/2017 Elsevier Patient Education  [place] After  Please read the instructions outlined below and refer to this sheet in the next few weeks. These discharge instructions provide you with general information on caring for yourself after you leave the hospital. Your doctor may also give you specific instructions. While your treatment has been planned according to the most current medical practices available, unavoidable complications occasionally  occur. If you have any problems or questions after discharge, please call your doctor. HOME CARE INSTRUCTIONS Activity You may resume your regular activity but move at a slower pace for the next 24 hours.  Take frequent rest periods for the next 24 hours.  Walking will help expel (get rid of) the air and reduce the bloated feeling in your abdomen.  No driving for 24 hours (because of the anesthesia (medicine) used during the test).  You may shower.  Do not sign any important legal documents or operate any machinery for 24 hours (  because of the anesthesia used during the test).  Nutrition Drink plenty of fluids.  You may resume your normal diet.  Begin with a light meal and progress to your normal diet.  Avoid alcoholic beverages for 24 hours or as instructed by your caregiver.  Medications You may resume your normal medications unless your caregiver tells you otherwise. What you can expect today You may experience abdominal discomfort such as a feeling of fullness or "gas" pains.  You may experience a sore throat for 2 to 3 days. This is normal. Gargling with salt water may help this.  Follow-up Your doctor will discuss the results of your test with you. SEEK IMMEDIATE MEDICAL CARE IF: You have excessive nausea (feeling sick to your stomach) and/or vomiting.  You have severe abdominal pain and distention (swelling).  You have trouble swallowing.  You have a temperature over 100 F (37.8 C).  You have rectal bleeding or vomiting of blood.  Document Released: 02/15/2004 Document Revised: 06/22/2011 Document Reviewed: 08/28/2007 Esophageal Dilatation Esophageal dilatation, also called esophageal dilation, is a procedure to widen or open a blocked or narrowed part of the esophagus. The esophagus is the part of the body that moves food and liquid from the mouth to the stomach. You may need this procedure if: You have a buildup of scar tissue in your esophagus that makes it difficult,  painful, or impossible to swallow. This can be caused by gastroesophageal reflux disease (GERD). You have cancer of the esophagus. There is a problem with how food moves through your esophagus. In some cases, you may need this procedure repeated at a later time to dilate the esophagus gradually. Tell a health care provider about: Any allergies you have. All medicines you are taking, including vitamins, herbs, eye drops, creams, and over-the-counter medicines. Any problems you or family members have had with anesthetic medicines. Any blood disorders you have. Any surgeries you have had. Any medical conditions you have. Any antibiotic medicines you are required to take before dental procedures. Whether you are pregnant or may be pregnant. What are the risks? Generally, this is a safe procedure. However, problems may occur, including: Bleeding due to a tear in the lining of the esophagus. A hole, or perforation, in the esophagus. What happens before the procedure? Ask your health care provider about: Changing or stopping your regular medicines. This is especially important if you are taking diabetes medicines or blood thinners. Taking medicines such as aspirin and ibuprofen. These medicines can thin your blood. Do not take these medicines unless your health care provider tells you to take them. Taking over-the-counter medicines, vitamins, herbs, and supplements. Follow instructions from your health care provider about eating or drinking restrictions. Plan to have a responsible adult take you home from the hospital or clinic. Plan to have a responsible adult care for you for the time you are told after you leave the hospital or clinic. This is important. What happens during the procedure? You may be given a medicine to help you relax (sedative). A numbing medicine may be sprayed into the back of your throat, or you may gargle the medicine. Your health care provider may perform the dilatation  using various surgical instruments, such as: Simple dilators. This instrument is carefully placed in the esophagus to stretch it. Guided wire bougies. This involves using an endoscope to insert a wire into the esophagus. A dilator is passed over this wire to enlarge the esophagus. Then the wire is removed. Balloon dilators. An endoscope  with a small balloon is inserted into the esophagus. The balloon is inflated to stretch the esophagus and open it up. The procedure may vary among health care providers and hospitals. What can I expect after the procedure? Your blood pressure, heart rate, breathing rate, and blood oxygen level will be monitored until you leave the hospital or clinic. Your throat may feel slightly sore and numb. This will get better over time. You will not be allowed to eat or drink until your throat is no longer numb. When you are able to drink, urinate, and sit on the edge of the bed without nausea or dizziness, you may be able to return home. Follow these instructions at home: Take over-the-counter and prescription medicines only as told by your health care provider. If you were given a sedative during the procedure, it can affect you for several hours. Do not drive or operate machinery until your health care provider says that it is safe. Plan to have a responsible adult care for you for the time you are told. This is important. Follow instructions from your health care provider about any eating or drinking restrictions. Do not use any products that contain nicotine or tobacco, such as cigarettes, e-cigarettes, and chewing tobacco. If you need help quitting, ask your health care provider. Keep all follow-up visits. This is important. Contact a health care provider if: You have a fever. You have pain that is not relieved by medicine. Get help right away if: You have chest pain. You have trouble breathing. You have trouble swallowing. You vomit blood. You have black, tarry,  or bloody stools. These symptoms may represent a serious problem that is an emergency. Do not wait to see if the symptoms will go away. Get medical help right away. Call your local emergency services (911 in the [place] Do not drive yourself to the hospital. Summary Esophageal dilatation, also called esophageal dilation, is a procedure to widen or open a blocked or narrowed part of the esophagus. Plan to have a responsible adult take you home from the hospital or clinic. For this procedure, a numbing medicine may be sprayed into the back of your throat, or you may gargle the medicine. Do not drive or operate machinery until your health care provider says that it is safe. This information is not intended to replace advice given to you by your health care provider. Make sure you discuss any questions you have with your health care provider. Document Revised: 11/19/2019 Document Reviewed: 11/19/2019 Elsevier Patient Education  2024 [employer].

## 2023-01-16 ENCOUNTER — Encounter (HOSPITAL_COMMUNITY): Payer: Self-pay

## 2023-01-16 ENCOUNTER — Telehealth: Payer: Self-pay | Admitting: *Deleted

## 2023-01-16 ENCOUNTER — Encounter (HOSPITAL_COMMUNITY)
Admission: RE | Admit: 2023-01-16 | Discharge: 2023-01-16 | Disposition: A | Discharge status: Home / Self Care | Payer: MEDICAID | Source: Ambulatory Visit | Attending: Internal Medicine | Admitting: Internal Medicine

## 2023-01-16 DIAGNOSIS — Z01818 Encounter for other preprocedural examination: Secondary | ICD-10-CM

## 2023-01-16 NOTE — Telephone Encounter (Signed)
Cancellation Received: Today Jethro Bolus, RN  Corbin Ade, MD; Orlin Hilding, CMA Patient called to cancel his procedure for now due to insurance issues.  He will reschedule at a later time.

## 2023-01-16 NOTE — Progress Notes (Signed)
Patient called to cancel his EGD/ED for tomorrow due to insurance issues. He will reschedule at a later time.

## 2023-01-16 NOTE — Telephone Encounter (Signed)
Noted  

## 2023-01-17 ENCOUNTER — Ambulatory Visit (HOSPITAL_COMMUNITY): Admission: RE | Admit: 2023-01-17 | Payer: MEDICAID | Source: Home / Self Care | Admitting: Internal Medicine

## 2023-01-17 ENCOUNTER — Encounter (HOSPITAL_COMMUNITY): Admission: RE | Payer: Self-pay | Source: Home / Self Care

## 2023-01-17 SURGERY — ESOPHAGOGASTRODUODENOSCOPY (EGD) WITH PROPOFOL
Anesthesia: Monitor Anesthesia Care

## 2023-02-15 ENCOUNTER — Other Ambulatory Visit: Payer: Self-pay

## 2023-02-15 ENCOUNTER — Encounter (HOSPITAL_COMMUNITY): Payer: Self-pay | Admitting: *Deleted

## 2023-02-15 ENCOUNTER — Emergency Department (HOSPITAL_COMMUNITY)
Admission: EM | Admit: 2023-02-15 | Discharge: 2023-02-15 | Disposition: A | Discharge status: Home / Self Care | Payer: MEDICAID | Attending: Student | Admitting: Student

## 2023-02-15 ENCOUNTER — Emergency Department (HOSPITAL_COMMUNITY): Payer: MEDICAID

## 2023-02-15 DIAGNOSIS — F17210 Nicotine dependence, cigarettes, uncomplicated: Secondary | ICD-10-CM | POA: Diagnosis not present

## 2023-02-15 DIAGNOSIS — I10 Essential (primary) hypertension: Secondary | ICD-10-CM | POA: Insufficient documentation

## 2023-02-15 DIAGNOSIS — Z79899 Other long term (current) drug therapy: Secondary | ICD-10-CM | POA: Insufficient documentation

## 2023-02-15 DIAGNOSIS — W01110A Fall on same level from slipping, tripping and stumbling with subsequent striking against sharp glass, initial encounter: Secondary | ICD-10-CM | POA: Diagnosis not present

## 2023-02-15 DIAGNOSIS — Z7952 Long term (current) use of systemic steroids: Secondary | ICD-10-CM | POA: Insufficient documentation

## 2023-02-15 DIAGNOSIS — Z7951 Long term (current) use of inhaled steroids: Secondary | ICD-10-CM | POA: Insufficient documentation

## 2023-02-15 DIAGNOSIS — S61511A Laceration without foreign body of right wrist, initial encounter: Secondary | ICD-10-CM | POA: Diagnosis present

## 2023-02-15 DIAGNOSIS — J449 Chronic obstructive pulmonary disease, unspecified: Secondary | ICD-10-CM | POA: Diagnosis not present

## 2023-02-15 DIAGNOSIS — Z7982 Long term (current) use of aspirin: Secondary | ICD-10-CM | POA: Insufficient documentation

## 2023-02-15 MED ORDER — LIDOCAINE-EPINEPHRINE (PF) 2 %-1:200000 IJ SOLN
INTRAMUSCULAR | Status: AC
  Filled 2023-02-15: qty 20

## 2023-02-15 MED ORDER — LIDOCAINE-EPINEPHRINE (PF) 2 %-1:200000 IJ SOLN
10.0000 mL | Freq: Once | INTRAMUSCULAR | Status: DC
  Filled 2023-02-15: qty 20

## 2023-02-15 NOTE — ED Triage Notes (Signed)
Pt c/o laceration to right medial wrist that occurred about 2am this morning when he fell and cut his wrist on his glass cabinets. Denies use of blood thinners. Last tetanus less than 5 years ago.

## 2023-02-15 NOTE — ED Notes (Signed)
Wrapped pt right wrist with Curad and bandage before discharging pt to go home

## 2023-02-15 NOTE — ED Provider Notes (Signed)
[place] EMERGENCY DEPARTMENT AT [facility] Provider Note  CSN: [id number] Arrival date & time: 02/15/23 4098  Chief Complaint(s) Extremity Laceration  HPI Sean Thomas is a 62 y.o. male Who presents emergency room for evaluation of a fall with a wrist laceration.  Patient reportedly fell into his glass cabinet at around 2 AM this morning and wrapped his right wrist very tightly.  He arrives due to persistent bleeding from the wound with significant swelling to the right hand.  Endorses some mild pain near the wrist but denies any additional traumatic complaints.  Tetanus is up-to-date.  Denies chest pain, shortness of breath, Donnell pain, nausea, vomiting or other systemic or traumatic symptoms.   Past Medical History Past Medical History:  Diagnosis Date   Candida esophagitis (HCC)    12/2019; 07/2022   Cervical pain 11/21/2012   COPD (chronic obstructive pulmonary disease) (HCC)    DDD (degenerative disc disease)    Depression    Esophageal dysphagia    Fracture of multiple ribs 11/01/2012   Fracture of occipital condyle (HCC) 11/21/2012   GERD (gastroesophageal reflux disease)    Headache    Hyperlipidemia    Inguinal hernia 12/19/2013   Microcytic anemia    N&V (nausea and vomiting) 05/23/2016   Patient Active Problem List   Diagnosis Date Noted   Pre-operative respiratory examination 10/30/2022   Esophageal candidiasis (HCC) 08/30/2022   Occult blood in stools 08/02/2022   GI bleed 08/01/2022   Acute blood loss anemia 08/01/2022   Elevated troponin 08/01/2022   Thrombocytosis 08/01/2022   Right inguinal hernia    Elevated PSA 04/06/2021   Benign prostatic hyperplasia with urinary obstruction 04/06/2021   Nocturia 04/06/2021   Symptomatic anemia 02/11/2020   Loss of weight 12/24/2019   Postural dizziness with presyncope 11/21/2019   Tobacco use disorder 10/30/2016   Hypertension    Hyperlipidemia    Chronic respiratory failure with hypoxia (HCC)  10/21/2016   Essential hypertension 10/20/2016   GERD (gastroesophageal reflux disease) 09/13/2016   History of colonic polyps 09/13/2016   Dysphagia 05/23/2016   Abdominal pain, epigastric 05/23/2016   Iron deficiency anemia 05/23/2016   N&V (nausea and vomiting) 05/23/2016   Hyperglycemia 02/25/2016   Chronic pain 02/25/2016   Malnutrition of moderate degree (HCC) 12/21/2014   Pneumothorax on right 12/20/2014   Rib fractures 12/20/2014   Pneumothorax, right 12/20/2014   COPD (chronic obstructive pulmonary disease) (HCC) 12/19/2013   Chest pain 12/19/2013   Inguinal hernia 12/19/2013   Fracture of occipital condyle (HCC) 11/21/2012   Cervical pain 11/21/2012   Fracture of multiple ribs 11/01/2012   Home Medication(s) Prior to Admission medications   Medication Sig Start Date End Date Taking? Authorizing Provider  albuterol (VENTOLIN HFA) 108 (90 Base) MCG/ACT inhaler Inhale 1-2 puffs into the lungs every 6 (six) hours as needed for wheezing or shortness of breath. 02/16/22   [provider]  Aspirin-Caffeine 845-65 MG PACK Take 1 Package by mouth 6 (six) times daily.    [provider]  ferrous sulfate 325 (65 FE) MG EC tablet Take 1 tablet (325 mg total) by mouth daily with breakfast. 08/30/22   Letta Median, PA-C  Fluticasone-Umeclidin-Vilant (TRELEGY ELLIPTA) 100-62.5-25 MCG/INH AEPB Inhale 1 puff into the lungs daily. Patient not taking: Reported on 12/14/2022 11/01/20   Liberty Handy, PA-C  gabapentin (NEURONTIN) 600 MG tablet Take 600 mg by mouth 3 (three) times daily. 03/14/21   [provider]  ipratropium-albuterol (DUONEB) 0.5-2.5 (  3) MG/3ML SOLN Inhale 3 mLs into the lungs every 6 (six) hours as needed (shortness of breath). 03/02/22   [provider]  pantoprazole (PROTONIX) 40 MG tablet Take 1 tablet (40 mg total) by mouth 2 (two) times daily before a meal. 08/03/22   Sherryll Burger, Pratik D, DO  predniSONE (DELTASONE) 10 MG tablet Take 10 mg  by mouth every morning.    [provider]  predniSONE (DELTASONE) 20 MG tablet Take 2 tablets (40 mg total) by mouth daily with breakfast. Patient not taking: Reported on 12/14/2022 03/01/22   Glenford Bayley, NP                                                                                                                                    Past Surgical History Past Surgical History:  Procedure Laterality Date   BACK SURGERY     COLONOSCOPY WITH PROPOFOL N/A 06/15/2016   RMR: inadequate bowel prep all mucosal surfaces not well seen. 5 mm tubular adenoma removed from the hepatic flexure. 3 mm polyp from the rectum was hyperplastic. He had scattered diverticula   COLONOSCOPY WITH PROPOFOL N/A 08/03/2022   Surgeon: Lanelle Bal, DO; diverticulosis in the sigmoid colon, otherwise normal exam.   ESOPHAGEAL BRUSHING  12/29/2019   Procedure: ESOPHAGEAL BRUSHING;  Surgeon: Corbin Ade, MD;  Location: [facility];  Service: Endoscopy;;   ESOPHAGOGASTRODUODENOSCOPY N/A 08/03/2022   Surgeon: Lanelle Bal, DO; nonsevere Candida esophagitis, esophageal ulcer, 3 centimeter hiatal hernia with a few small Cameron erosions.   ESOPHAGOGASTRODUODENOSCOPY (EGD) WITH PROPOFOL N/A 06/15/2016   Dr. Jena Gauss: Large hiatal hernia, reflux esophagitis, Schatzki ring with focal area of ulceration, status post dilation with 1F Maloney dilator with moderate improvement in luminal narrowing.   ESOPHAGOGASTRODUODENOSCOPY (EGD) WITH PROPOFOL N/A 12/29/2019   Surgeon: Corbin Ade, MD; Candida esophagitis, Schatzki ring status post dilation, medium sized hiatal hernia with a single Cameron lesion.  KOH positive, treated with Diflucan for 21 days.   ESOPHAGOGASTRODUODENOSCOPY (EGD) WITH PROPOFOL N/A 02/22/2022   Surgeon: Corbin Ade, MD; Moderate sized hiatal hernia with Sheria Lang erosions, Schatzki's ring/peptic stricture with ulcer reflux esophagitis s/p dilation   INGUINAL HERNIA REPAIR Left  07/24/2014   Procedure: LAPAROSCOPIC LEFT INGUINAL HERNIA REPAIR ;  Surgeon: Axel Filler, MD;  Location: [facility] OR;  Service: General;  Laterality: Left;   INGUINAL HERNIA REPAIR Right 08/17/2021   Procedure: HERNIA REPAIR INGUINAL ADULT W/ MESH;  Surgeon: Franky Macho, MD;  Location: [facility] ORS;  Service: General;  Laterality: Right;   INSERTION OF MESH Left 07/24/2014   Procedure: INSERTION OF MESH;  Surgeon: Axel Filler, MD;  Location: [facility] OR;  Service: General;  Laterality: Left;   LUMBAR FUSION     MALONEY DILATION N/A 06/15/2016   Procedure: Elease Hashimoto DILATION;  Surgeon: Corbin Ade, MD;  Location: [facility];  Service: Endoscopy;  Laterality: N/A;   MALONEY DILATION  N/A 12/29/2019   Procedure: Elease Hashimoto DILATION;  Surgeon: Corbin Ade, MD;  Location: [facility];  Service: Endoscopy;  Laterality: N/A;   MALONEY DILATION N/A 02/22/2022   Procedure: Elease Hashimoto DILATION;  Surgeon: Corbin Ade, MD;  Location: [facility];  Service: Endoscopy;  Laterality: N/A;   POLYPECTOMY  06/15/2016   Procedure: POLYPECTOMY;  Surgeon: Corbin Ade, MD;  Location: [facility];  Service: Endoscopy;;  colon   right lung surgery Right    Family History Family History  Problem Relation Age of Onset   Diabetes Mother    Alzheimer's disease Mother    Diabetes Sister    Colon cancer Neg Hx    Liver disease Neg Hx     Social History Social History   Tobacco Use   Smoking status: Every Day    Current packs/day: 1.00    Average packs/day: 1 pack/day for 45.0 years (45.0 ttl pk-yrs)    Types: Cigarettes    Passive exposure: Current   Smokeless tobacco: Never  Vaping Use   Vaping status: Never Used  Substance Use Topics   Alcohol use: Yes    Alcohol/week: 9.0 standard drinks of alcohol    Types: 9 Cans of beer per week    Comment: couple drinks a week per pt as of 02/15/23   Drug use: Not Currently    Types: Marijuana, Cocaine    Comment: Cocaine last week    Allergies Acetaminophen, Advil [ibuprofen], and Aleve [naproxen sodium]  Review of Systems Review of Systems  Skin:  Positive for wound.    Physical Exam Vital Signs  I have reviewed the triage vital signs BP 133/81 (BP Location: Left Arm)   Pulse 95   Temp 98.2 F (36.8 C) (Oral)   Resp 18   SpO2 96%   Physical Exam Constitutional:      General: He is not in acute distress.    Appearance: Normal appearance.  HENT:     Head: Normocephalic and atraumatic.     Nose: No congestion or rhinorrhea.  Eyes:     General:        Right eye: No discharge.        Left eye: No discharge.     Extraocular Movements: Extraocular movements intact.     Pupils: Pupils are equal, round, and reactive to light.  Cardiovascular:     Rate and Rhythm: Normal rate and regular rhythm.     Heart sounds: No murmur heard. Pulmonary:     Effort: No respiratory distress.     Breath sounds: No wheezing or rales.  Abdominal:     General: There is no distension.     Tenderness: There is no abdominal tenderness.  Musculoskeletal:        General: Swelling present. Normal range of motion.     Cervical back: Normal range of motion.  Skin:    General: Skin is warm and dry.     Findings: Lesion present.  Neurological:     General: No focal deficit present.     Mental Status: He is alert.     ED Results and Treatments Labs (all labs ordered are listed, but only abnormal results are displayed) Labs Reviewed - No data to display  Radiology DG Wrist Complete Right  Result Date: 02/15/2023 CLINICAL DATA:  fall, concern for fx; fall, r/o fx EXAM: RIGHT WRIST - COMPLETE 3+ VIEW; RIGHT HAND - COMPLETE 3+ VIEW COMPARISON:  None Available. FINDINGS: Bone mineralization within normal limits for patient's age. No acute fracture or dislocation. No aggressive osseous lesion. Mild arthritic  changes of imaged joints. No radiopaque foreign bodies. Soft tissues are within normal limits. IMPRESSION: 1. Mild arthritic changes. No acute fracture or dislocation. In case of strong clinical suspicion for occult fracture, consider cross-sectional imaging. Electronically Signed   By: Jules Schick M.D.   On: 02/15/2023 10:13   DG Hand Complete Right  Result Date: 02/15/2023 CLINICAL DATA:  fall, concern for fx; fall, r/o fx EXAM: RIGHT WRIST - COMPLETE 3+ VIEW; RIGHT HAND - COMPLETE 3+ VIEW COMPARISON:  None Available. FINDINGS: Bone mineralization within normal limits for patient's age. No acute fracture or dislocation. No aggressive osseous lesion. Mild arthritic changes of imaged joints. No radiopaque foreign bodies. Soft tissues are within normal limits. IMPRESSION: 1. Mild arthritic changes. No acute fracture or dislocation. In case of strong clinical suspicion for occult fracture, consider cross-sectional imaging. Electronically Signed   By: Jules Schick M.D.   On: 02/15/2023 10:13    Pertinent labs & imaging results that were available during my care of the patient were reviewed by me and considered in my medical decision making (see MDM for details).  Medications Ordered in ED Medications  lidocaine-EPINEPHrine (XYLOCAINE W/EPI) 2 %-1:200000 (PF) injection 10 mL (has no administration in time range)  lidocaine-EPINEPHrine (XYLOCAINE W/EPI) 2 %-1:200000 (PF) injection (has no administration in time range)                                                                                                                                     Procedures .Marland KitchenLaceration Repair  Date/Time: 02/15/2023 10:31 AM  Performed by: Glendora Score, MD Authorized by: Glendora Score, MD   Anesthesia:    Anesthesia method:  Local infiltration   Local anesthetic:  Lidocaine 2% WITH epi Laceration details:    Location:  Hand   Hand location:  R wrist   Length (cm):  3 Treatment:    Amount of cleaning:   Standard Skin repair:    Repair method:  Sutures   Suture size:  4-0   Suture material:  Prolene   Number of sutures:  5 Repair type:    Repair type:  Simple Post-procedure details:    Dressing:  Non-adherent dressing   Procedure completion:  Tolerated well, no immediate complications   (including critical care time)  Medical Decision Making / ED Course   This patient presents to the ED for concern of wrist laceration, this involves an extensive number of treatment options, and is a complaint that carries with it a high risk of complications and morbidity.  The differential diagnosis includes laceration, fracture, hematoma, contusion  MDM:  Patient seen emergency room for evaluation of a wrist laceration.  Physical exam with a 3 cm laceration over the right lateral wrist with associated swelling to the dorsal aspect of the right hand.  Very minimal tenderness over the hand or wrist.  X-ray imaging reassuringly negative for obvious acute fracture.  Low suspicion for occult fracture given benign physical exam..  Laceration repaired with Prolene sutures by medical assistant with my direct supervision.  Patient will require suture removal in 1 week.  Tetanus not updated as he is already up-to-date.  At this time does not meet inpatient criteria for admission he is safe for discharge with outpatient follow-up.   Additional history obtained:  -External records from outside source obtained and reviewed including: Chart review including previous notes, labs, imaging, consultation notes   Imaging Studies ordered: I ordered imaging studies including wrist x-ray, hand x-ray I independently visualized and interpreted imaging. I agree with the radiologist interpretation   Medicines ordered and prescription drug management: Meds ordered this encounter  Medications   lidocaine-EPINEPHrine (XYLOCAINE W/EPI) 2 %-1:200000 (PF) injection 10 mL   lidocaine-EPINEPHrine (XYLOCAINE W/EPI) 2 %-1:200000  (PF) injection    Rosanna Randy K: cabinet override    -I have reviewed the patients home medicines and have made adjustments as needed  Critical interventions none    Cardiac Monitoring: The patient was maintained on a cardiac monitor.  I personally viewed and interpreted the cardiac monitored which showed an underlying rhythm of: NSR  Social Determinants of Health:  Factors impacting patients care include: none   Reevaluation: After the interventions noted above, I reevaluated the patient and found that they have :improved  Co morbidities that complicate the patient evaluation  Past Medical History:  Diagnosis Date   Candida esophagitis (HCC)    12/2019; 07/2022   Cervical pain 11/21/2012   COPD (chronic obstructive pulmonary disease) (HCC)    DDD (degenerative disc disease)    Depression    Esophageal dysphagia    Fracture of multiple ribs 11/01/2012   Fracture of occipital condyle (HCC) 11/21/2012   GERD (gastroesophageal reflux disease)    Headache    Hyperlipidemia    Inguinal hernia 12/19/2013   Microcytic anemia    N&V (nausea and vomiting) 05/23/2016      Dispostion: I considered admission for this patient, but at this time he does not meet inpatient criteria for admission he is safe for discharge with outpatient follow-up     Final Clinical Impression(s) / ED Diagnoses Final diagnoses:  None     @PCDICTATION @    Glendora Score, MD 02/15/23 1034

## 2023-04-28 ENCOUNTER — Other Ambulatory Visit: Payer: Self-pay

## 2023-04-28 ENCOUNTER — Encounter (HOSPITAL_COMMUNITY): Payer: Self-pay

## 2023-04-28 ENCOUNTER — Observation Stay (HOSPITAL_COMMUNITY)
Admission: EM | Admit: 2023-04-28 | Discharge: 2023-04-30 | Disposition: A | Discharge status: Home / Self Care | Payer: MEDICAID | Attending: Emergency Medicine | Admitting: Emergency Medicine

## 2023-04-28 ENCOUNTER — Emergency Department (HOSPITAL_COMMUNITY): Payer: MEDICAID

## 2023-04-28 DIAGNOSIS — F1410 Cocaine abuse, uncomplicated: Secondary | ICD-10-CM | POA: Diagnosis not present

## 2023-04-28 DIAGNOSIS — J449 Chronic obstructive pulmonary disease, unspecified: Secondary | ICD-10-CM | POA: Diagnosis not present

## 2023-04-28 DIAGNOSIS — I10 Essential (primary) hypertension: Secondary | ICD-10-CM | POA: Insufficient documentation

## 2023-04-28 DIAGNOSIS — D649 Anemia, unspecified: Secondary | ICD-10-CM | POA: Diagnosis not present

## 2023-04-28 DIAGNOSIS — J9601 Acute respiratory failure with hypoxia: Secondary | ICD-10-CM | POA: Diagnosis not present

## 2023-04-28 DIAGNOSIS — F17210 Nicotine dependence, cigarettes, uncomplicated: Secondary | ICD-10-CM | POA: Insufficient documentation

## 2023-04-28 DIAGNOSIS — R0789 Other chest pain: Secondary | ICD-10-CM | POA: Diagnosis present

## 2023-04-28 DIAGNOSIS — F17200 Nicotine dependence, unspecified, uncomplicated: Secondary | ICD-10-CM | POA: Diagnosis present

## 2023-04-28 DIAGNOSIS — Z7982 Long term (current) use of aspirin: Secondary | ICD-10-CM | POA: Insufficient documentation

## 2023-04-28 DIAGNOSIS — R079 Chest pain, unspecified: Secondary | ICD-10-CM | POA: Diagnosis present

## 2023-04-28 DIAGNOSIS — J438 Other emphysema: Secondary | ICD-10-CM

## 2023-04-28 DIAGNOSIS — K922 Gastrointestinal hemorrhage, unspecified: Secondary | ICD-10-CM | POA: Diagnosis present

## 2023-04-28 DIAGNOSIS — Z1152 Encounter for screening for COVID-19: Secondary | ICD-10-CM | POA: Insufficient documentation

## 2023-04-28 DIAGNOSIS — J9611 Chronic respiratory failure with hypoxia: Secondary | ICD-10-CM | POA: Diagnosis present

## 2023-04-28 DIAGNOSIS — Z79899 Other long term (current) drug therapy: Secondary | ICD-10-CM | POA: Diagnosis not present

## 2023-04-28 LAB — COMPREHENSIVE METABOLIC PANEL
ALT: 10 U/L (ref 0–44)
AST: 12 U/L — ABNORMAL LOW (ref 15–41)
Albumin: 3.7 g/dL (ref 3.5–5.0)
Alkaline Phosphatase: 56 U/L (ref 38–126)
Anion gap: 9 (ref 5–15)
BUN: 16 mg/dL (ref 8–23)
CO2: 22 mmol/L (ref 22–32)
Calcium: 8.9 mg/dL (ref 8.9–10.3)
Chloride: 106 mmol/L (ref 98–111)
Creatinine, Ser: 0.96 mg/dL (ref 0.61–1.24)
GFR, Estimated: >60 mL/min (ref >60)
Glucose, Bld: 95 mg/dL (ref 70–99)
Potassium: 3.7 mmol/L (ref 3.5–5.1)
Sodium: 137 mmol/L (ref 135–145)
Total Bilirubin: 0.4 mg/dL (ref 0.3–1.2)
Total Protein: 6.7 g/dL (ref 6.5–8.1)

## 2023-04-28 LAB — CBC WITH DIFFERENTIAL/PLATELET
Abs Immature Granulocytes: 0.06 10*3/uL (ref 0.00–0.07)
Basophils Absolute: 0.1 10*3/uL (ref 0.0–0.1)
Basophils Relative: 1 %
Eosinophils Absolute: 0.2 10*3/uL (ref 0.0–0.5)
Eosinophils Relative: 1 %
HCT: 26.8 % — ABNORMAL LOW (ref 39.0–52.0)
Hemoglobin: 6.5 g/dL — CL (ref 13.0–17.0)
Immature Granulocytes: 1 %
Lymphocytes Relative: 38 %
Lymphs Abs: 5 10*3/uL — ABNORMAL HIGH (ref 0.7–4.0)
MCH: 15.3 pg — ABNORMAL LOW (ref 26.0–34.0)
MCHC: 24.3 g/dL — ABNORMAL LOW (ref 30.0–36.0)
MCV: 63.2 fL — ABNORMAL LOW (ref 80.0–100.0)
Monocytes Absolute: 1.2 10*3/uL — ABNORMAL HIGH (ref 0.1–1.0)
Monocytes Relative: 9 %
Neutro Abs: 6.7 10*3/uL (ref 1.7–7.7)
Neutrophils Relative %: 50 %
Platelets: 485 10*3/uL — ABNORMAL HIGH (ref 150–400)
RBC: 4.24 MIL/uL (ref 4.22–5.81)
RDW: 23.7 % — ABNORMAL HIGH (ref 11.5–15.5)
WBC: 13.2 10*3/uL — ABNORMAL HIGH (ref 4.0–10.5)
nRBC: 0 % (ref 0.0–0.2)

## 2023-04-28 LAB — PREPARE RBC (CROSSMATCH)

## 2023-04-28 LAB — RAPID URINE DRUG SCREEN, HOSP PERFORMED
Amphetamines: NOT DETECTED
Barbiturates: NOT DETECTED
Benzodiazepines: NOT DETECTED
Cocaine: POSITIVE — AB
Opiates: NOT DETECTED
Tetrahydrocannabinol: NOT DETECTED

## 2023-04-28 LAB — TROPONIN I (HIGH SENSITIVITY)
Troponin I (High Sensitivity): 4 ng/L (ref <18)
Troponin I (High Sensitivity): 5 ng/L (ref <18)

## 2023-04-28 LAB — URINALYSIS, ROUTINE W REFLEX MICROSCOPIC
Bilirubin Urine: NEGATIVE
Glucose, UA: NEGATIVE mg/dL
Hgb urine dipstick: NEGATIVE
Ketones, ur: NEGATIVE mg/dL
Leukocytes,Ua: NEGATIVE
Nitrite: NEGATIVE
Protein, ur: NEGATIVE mg/dL
Specific Gravity, Urine: 1.016 (ref 1.005–1.030)
pH: 5 (ref 5.0–8.0)

## 2023-04-28 LAB — RESP PANEL BY RT-PCR (RSV, FLU A&B, COVID)  RVPGX2
Influenza A by PCR: NEGATIVE
Influenza B by PCR: NEGATIVE
Resp Syncytial Virus by PCR: NEGATIVE
SARS Coronavirus 2 by RT PCR: NEGATIVE

## 2023-04-28 LAB — POC OCCULT BLOOD, ED: Fecal Occult Bld: NEGATIVE

## 2023-04-28 MED ORDER — PANTOPRAZOLE SODIUM 40 MG IV SOLR
80.0000 mg | Freq: Once | INTRAVENOUS | Status: AC
  Administered 2023-04-28: 80 mg via INTRAVENOUS
  Filled 2023-04-28: qty 20

## 2023-04-28 MED ORDER — BUPROPION HCL ER (SR) 150 MG PO TB12
150.0000 mg | ORAL_TABLET | Freq: Two times a day (BID) | ORAL | Status: DC
  Administered 2023-04-28 – 2023-04-30 (×4): 150 mg via ORAL
  Filled 2023-04-28 (×5): qty 1

## 2023-04-28 MED ORDER — FLUTICASONE FUROATE-VILANTEROL 100-25 MCG/ACT IN AEPB
1.0000 | INHALATION_SPRAY | Freq: Every day | RESPIRATORY_TRACT | Status: DC
  Administered 2023-04-29 – 2023-04-30 (×2): 1 via RESPIRATORY_TRACT
  Filled 2023-04-28: qty 28

## 2023-04-28 MED ORDER — POLYETHYLENE GLYCOL 3350 17 G PO PACK: 17.0000 g | PACK | Freq: Every day | ORAL | Status: DC | PRN

## 2023-04-28 MED ORDER — IPRATROPIUM-ALBUTEROL 0.5-2.5 (3) MG/3ML IN SOLN: 3.0000 mL | Freq: Four times a day (QID) | RESPIRATORY_TRACT | Status: DC | PRN

## 2023-04-28 MED ORDER — ONDANSETRON HCL 4 MG/2ML IJ SOLN
4.0000 mg | Freq: Four times a day (QID) | INTRAMUSCULAR | Status: DC | PRN
  Filled 2023-04-28: qty 2

## 2023-04-28 MED ORDER — ONDANSETRON HCL 4 MG PO TABS: 4.0000 mg | ORAL_TABLET | Freq: Four times a day (QID) | ORAL | Status: DC | PRN

## 2023-04-28 MED ORDER — PANTOPRAZOLE SODIUM 40 MG IV SOLR
40.0000 mg | Freq: Two times a day (BID) | INTRAVENOUS | Status: DC
  Administered 2023-04-29 – 2023-04-30 (×3): 40 mg via INTRAVENOUS
  Filled 2023-04-28 (×3): qty 10

## 2023-04-28 MED ORDER — TAMSULOSIN HCL 0.4 MG PO CAPS
0.4000 mg | ORAL_CAPSULE | Freq: Every day | ORAL | Status: DC
  Administered 2023-04-29: 0.4 mg via ORAL
  Filled 2023-04-28 (×2): qty 1

## 2023-04-28 MED ORDER — GABAPENTIN 300 MG PO CAPS
600.0000 mg | ORAL_CAPSULE | Freq: Three times a day (TID) | ORAL | Status: DC
  Administered 2023-04-28 – 2023-04-30 (×5): 600 mg via ORAL
  Filled 2023-04-28: qty 2
  Filled 2023-04-28: qty 6
  Filled 2023-04-28: qty 2
  Filled 2023-04-28: qty 6
  Filled 2023-04-28: qty 2
  Filled 2023-04-28: qty 6

## 2023-04-28 MED ORDER — ACETAMINOPHEN 650 MG RE SUPP: 650.0000 mg | Freq: Four times a day (QID) | RECTAL | Status: DC | PRN

## 2023-04-28 MED ORDER — UMECLIDINIUM BROMIDE 62.5 MCG/ACT IN AEPB
1.0000 | INHALATION_SPRAY | Freq: Every day | RESPIRATORY_TRACT | Status: DC
  Administered 2023-04-29 – 2023-04-30 (×2): 1 via RESPIRATORY_TRACT
  Filled 2023-04-28: qty 7

## 2023-04-28 MED ORDER — HYDROCODONE-ACETAMINOPHEN 5-325 MG PO TABS
1.0000 | ORAL_TABLET | Freq: Four times a day (QID) | ORAL | Status: DC | PRN
  Administered 2023-04-28 – 2023-04-29 (×2): 1 via ORAL
  Filled 2023-04-28 (×4): qty 1

## 2023-04-28 MED ORDER — ACETAMINOPHEN 325 MG PO TABS
650.0000 mg | ORAL_TABLET | Freq: Four times a day (QID) | ORAL | Status: DC | PRN
  Filled 2023-04-28: qty 2

## 2023-04-28 MED ORDER — SODIUM CHLORIDE 0.9% IV SOLUTION: Freq: Once | INTRAVENOUS | Status: DC

## 2023-04-28 NOTE — ED Notes (Signed)
ED TO INPATIENT HANDOFF REPORT  ED Nurse Name and Phone #: [phone]  S Name/Age/Gender Sean Thomas 62 y.o. male Room/Bed: APA12/APA12  Code Status   Code Status: Prior  Home/SNF/Other Home Patient oriented to: self, place, time, and situation Is this baseline? Yes   Triage Complete: Triage complete  Chief Complaint Acute on chronic anemia [D64.9]  Triage Note Left sided chest pain started at 3pm yesterday. Reproducible on palpation  Complains of nausea, vomiting and cough.  Denies diarrhea and fever   Allergies Allergies  Allergen Reactions   Acetaminophen     Pt states he can take this now, without issues   Advil [Ibuprofen] Diarrhea   Aleve [Naproxen Sodium] Diarrhea and Nausea And Vomiting    Pt states he can take this now, without issues    Level of Care/Admitting Diagnosis ED Disposition     ED Disposition  Admit   Condition  --   Comment  Hospital Area: [facility] [[id number]]  Level of Care: Telemetry [5]  Covid Evaluation: Asymptomatic - no recent exposure (last 10 days) testing not required  Diagnosis: Acute on chronic anemia [[id number]]  Admitting Physician: Onnie Boer [[id number]]  Attending Physician: Onnie Boer [email or web address]          B Medical/Surgery History Past Medical History:  Diagnosis Date   Candida esophagitis (HCC)    12/2019; 07/2022   Cervical pain 11/21/2012   COPD (chronic obstructive pulmonary disease) (HCC)    DDD (degenerative disc disease)    Depression    Esophageal dysphagia    Fracture of multiple ribs 11/01/2012   Fracture of occipital condyle (HCC) 11/21/2012   GERD (gastroesophageal reflux disease)    Headache    Hyperlipidemia    Inguinal hernia 12/19/2013   Microcytic anemia    N&V (nausea and vomiting) 05/23/2016   Past Surgical History:  Procedure Laterality Date   BACK SURGERY     COLONOSCOPY WITH PROPOFOL N/A 06/15/2016   RMR: inadequate bowel prep all mucosal surfaces not well  seen. 5 mm tubular adenoma removed from the hepatic flexure. 3 mm polyp from the rectum was hyperplastic. He had scattered diverticula   COLONOSCOPY WITH PROPOFOL N/A 08/03/2022   Surgeon: Lanelle Bal, DO; diverticulosis in the sigmoid colon, otherwise normal exam.   ESOPHAGEAL BRUSHING  12/29/2019   Procedure: ESOPHAGEAL BRUSHING;  Surgeon: Corbin Ade, MD;  [facility];  Service: Endoscopy;;   ESOPHAGOGASTRODUODENOSCOPY N/A 08/03/2022   Surgeon: Lanelle Bal, DO; nonsevere Candida esophagitis, esophageal ulcer, 3 centimeter hiatal hernia with a few small Cameron erosions.   ESOPHAGOGASTRODUODENOSCOPY (EGD) WITH PROPOFOL N/A 06/15/2016   Dr. Jena Gauss: Large hiatal hernia, reflux esophagitis, Schatzki ring with focal area of ulceration, status post dilation with 61F Maloney dilator with moderate improvement in luminal narrowing.   ESOPHAGOGASTRODUODENOSCOPY (EGD) WITH PROPOFOL N/A 12/29/2019   Surgeon: Corbin Ade, MD; Candida esophagitis, Schatzki ring status post dilation, medium sized hiatal hernia with a single Cameron lesion.  KOH positive, treated with Diflucan for 21 days.   ESOPHAGOGASTRODUODENOSCOPY (EGD) WITH PROPOFOL N/A 02/22/2022   Surgeon: Corbin Ade, MD; Moderate sized hiatal hernia with Sheria Lang erosions, Schatzki's ring/peptic stricture with ulcer reflux esophagitis s/p dilation   INGUINAL HERNIA REPAIR Left 07/24/2014   Procedure: LAPAROSCOPIC LEFT INGUINAL HERNIA REPAIR ;  Surgeon: Axel Filler, MD;  Location: [facility] OR;  Service: General;  Laterality: Left;   INGUINAL HERNIA REPAIR Right 08/17/2021   Procedure: HERNIA REPAIR INGUINAL ADULT W/ MESH;  Surgeon: Franky Macho, MD;  Location: [facility];  Service: General;  Laterality: Right;   INSERTION OF MESH Left 07/24/2014   Procedure: INSERTION OF MESH;  Surgeon: Axel Filler, MD;  Location: [facility] OR;  Service: General;  Laterality: Left;   LUMBAR FUSION     MALONEY DILATION N/A 06/15/2016    Procedure: Elease Hashimoto DILATION;  Surgeon: Corbin Ade, MD;  [facility];  Service: Endoscopy;  Laterality: N/A;   MALONEY DILATION N/A 12/29/2019   Procedure: Elease Hashimoto DILATION;  Surgeon: Corbin Ade, MD;  [facility];  Service: Endoscopy;  Laterality: N/A;   MALONEY DILATION N/A 02/22/2022   Procedure: Elease Hashimoto DILATION;  Surgeon: Corbin Ade, MD;  [facility];  Service: Endoscopy;  Laterality: N/A;   POLYPECTOMY  06/15/2016   Procedure: POLYPECTOMY;  Surgeon: Corbin Ade, MD;  [facility];  Service: Endoscopy;;  colon   right lung surgery Right      A IV Location/Drains/Wounds Patient Lines/Drains/Airways Status     Active Line/Drains/Airways     Name Placement date Placement time Site Days   Peripheral IV 04/28/23 20 G 1" Anterior;Distal;Right;Upper Arm 04/28/23  1659  Arm  less than 1            Intake/Output Last 24 hours No intake or output data in the 24 hours ending 04/28/23 2015  Labs/Imaging Results for orders placed or performed during the hospital encounter of 04/28/23 (from the past 48 hour(s))  CBC with Differential     Status: Abnormal   Collection Time: 04/28/23  4:52 PM  Result Value Ref Range   WBC 13.2 (H) 4.0 - 10.5 K/uL   RBC 4.24 4.22 - 5.81 MIL/uL   Hemoglobin 6.5 (LL) 13.0 - 17.0 g/dL    Comment: REPEATED TO VERIFY THIS CRITICAL RESULT HAS VERIFIED AND BEEN CALLED TO EANES,M BY JAMIE WOODIE ON 10 12 2024 AT 1712, AND HAS BEEN READ BACK.     HCT 26.8 (L) 39.0 - 52.0 %   MCV 63.2 (L) 80.0 - 100.0 fL   MCH 15.3 (L) 26.0 - 34.0 pg   MCHC 24.3 (L) 30.0 - 36.0 g/dL   RDW 16.1 (H) 09.6 - 04.5 %   Platelets 485 (H) 150 - 400 K/uL   nRBC 0.0 0.0 - 0.2 %   Neutrophils Relative % 50 %   Neutro Abs 6.7 1.7 - 7.7 K/uL   Lymphocytes Relative 38 %   Lymphs Abs 5.0 (H) 0.7 - 4.0 K/uL   Monocytes Relative 9 %   Monocytes Absolute 1.2 (H) 0.1 - 1.0 K/uL   Eosinophils Relative 1 %   Eosinophils Absolute  0.2 0.0 - 0.5 K/uL   Basophils Relative 1 %   Basophils Absolute 0.1 0.0 - 0.1 K/uL   Immature Granulocytes 1 %   Abs Immature Granulocytes 0.06 0.00 - 0.07 K/uL    Comment: Performed at [facility], [address], [place], [place] [address]  Comprehensive metabolic panel     Status: Abnormal   Collection Time: 04/28/23  4:52 PM  Result Value Ref Range   Sodium 137 135 - 145 mmol/L   Potassium 3.7 3.5 - 5.1 mmol/L   Chloride 106 98 - 111 mmol/L   CO2 22 22 - 32 mmol/L   Glucose, Bld 95 70 - 99 mg/dL    Comment: Glucose reference range applies only to samples taken after fasting for at least 8 hours.   BUN 16 8 -  23 mg/dL   Creatinine, Ser 8.11 0.61 - 1.24 mg/dL   Calcium 8.9 8.9 - 91.4 mg/dL   Total Protein 6.7 6.5 - 8.1 g/dL   Albumin 3.7 3.5 - 5.0 g/dL   AST 12 (L) 15 - 41 U/L   ALT 10 0 - 44 U/L   Alkaline Phosphatase 56 38 - 126 U/L   Total Bilirubin 0.4 0.3 - 1.2 mg/dL   GFR, Estimated >78 >29 mL/min    Comment: (NOTE) Calculated using the CKD-EPI Creatinine Equation (2021)    Anion gap 9 5 - 15    Comment: Performed at [facility], [address], [place], [place] [address]  Troponin I (High Sensitivity)     Status: None   Collection Time: 04/28/23  4:52 PM  Result Value Ref Range   Troponin I (High Sensitivity) 4 <18 ng/L    Comment: (NOTE) Elevated high sensitivity troponin I (hsTnI) values and significant  changes across serial measurements may suggest ACS but many other  chronic and acute conditions are known to elevate hsTnI results.  Refer to the "Links" section for chest pain algorithms and additional  guidance. Performed at [facility], [address], [place], [place] [address]   Resp panel by RT-PCR (RSV, Flu A&B, Covid) Anterior Nasal Swab     Status: None   Collection Time: 04/28/23  5:20 PM   Specimen: Anterior Nasal Swab  Result Value Ref Range   SARS Coronavirus 2 by RT PCR NEGATIVE NEGATIVE    Comment: (NOTE) SARS-CoV-2 target nucleic acids are  NOT DETECTED.  The SARS-CoV-2 RNA is generally detectable in upper respiratory specimens during the acute phase of infection. The lowest concentration of SARS-CoV-2 viral copies this assay can detect is 138 copies/mL. A negative result does not preclude SARS-Cov-2 infection and should not be used as the sole basis for treatment or other patient management decisions. A negative result may occur with  improper specimen collection/handling, submission of specimen other than nasopharyngeal swab, presence of viral mutation(s) within the areas targeted by this assay, and inadequate number of viral copies(<138 copies/mL). A negative result must be combined with clinical observations, patient history, and epidemiological information. The expected result is Negative.  Fact Sheet for Patients:  BloggerCourse.com  Fact Sheet for Healthcare Providers:  SeriousBroker.it  This test is no t yet approved or cleared by the [place] FDA and  has been authorized for detection and/or diagnosis of SARS-CoV-2 by FDA under an Emergency Use Authorization (EUA). This EUA will remain  in effect (meaning this test can be used) for the duration of the COVID-19 declaration under Section 564(b)(1) of the Act, 21 U.S.C.section 360bbb-3(b)(1), unless the authorization is terminated  or revoked sooner.       Influenza A by PCR NEGATIVE NEGATIVE   Influenza B by PCR NEGATIVE NEGATIVE    Comment: (NOTE) The Xpert Xpress SARS-CoV-2/FLU/RSV plus assay is intended as an aid in the diagnosis of influenza from Nasopharyngeal swab specimens and should not be used as a sole basis for treatment. Nasal washings and aspirates are unacceptable for Xpert Xpress SARS-CoV-2/FLU/RSV testing.  Fact Sheet for Patients: BloggerCourse.com  Fact Sheet for Healthcare Providers: SeriousBroker.it  This test is not yet approved  or cleared by the [place] FDA and has been authorized for detection and/or diagnosis of SARS-CoV-2 by FDA under an Emergency Use Authorization (EUA). This EUA will remain in effect (meaning this test can be used) for the duration of the COVID-19 declaration under Section 564(b)(1)  of the Act, 21 U.S.C. section 360bbb-3(b)(1), unless the authorization is terminated or revoked.     Resp Syncytial Virus by PCR NEGATIVE NEGATIVE    Comment: (NOTE) Fact Sheet for Patients: BloggerCourse.com  Fact Sheet for Healthcare Providers: SeriousBroker.it  This test is not yet approved or cleared by the [place] FDA and has been authorized for detection and/or diagnosis of SARS-CoV-2 by FDA under an Emergency Use Authorization (EUA). This EUA will remain in effect (meaning this test can be used) for the duration of the COVID-19 declaration under Section 564(b)(1) of the Act, 21 U.S.C. section 360bbb-3(b)(1), unless the authorization is terminated or revoked.  Performed at [facility], [address], [place], [place] [address]   Type and screen [facility]     Status: None (Preliminary result)   Collection Time: 04/28/23  5:52 PM  Result Value Ref Range   ABO/RH(D) A POS    Antibody Screen NEG    Sample Expiration 05/01/2023,2359    Unit Number [id number]    Blood Component Type RED CELLS,LR    Unit division 00    Status of Unit ISSUED    Transfusion Status OK TO TRANSFUSE    Crossmatch Result      Compatible Performed at [facility], [address], [place], [place] [address]   Prepare RBC (crossmatch)     Status: None   Collection Time: 04/28/23  5:52 PM  Result Value Ref Range   Order Confirmation      ORDER PROCESSED BY BLOOD BANK Performed at [facility], [address] [place], [place] [address]   POC occult blood, ED     Status: None   Collection Time: 04/28/23  6:22 PM  Result Value Ref Range   Fecal  Occult Bld NEGATIVE NEGATIVE  Troponin I (High Sensitivity)     Status: None   Collection Time: 04/28/23  6:59 PM  Result Value Ref Range   Troponin I (High Sensitivity) 5 <18 ng/L    Comment: (NOTE) Elevated high sensitivity troponin I (hsTnI) values and significant  changes across serial measurements may suggest ACS but many other  chronic and acute conditions are known to elevate hsTnI results.  Refer to the "Links" section for chest pain algorithms and additional  guidance. Performed at [facility], [address], [place], [place] [address]   Urinalysis, Routine w reflex microscopic -Urine, Clean Catch     Status: None   Collection Time: 04/28/23  7:25 PM  Result Value Ref Range   Color, Urine YELLOW YELLOW   APPearance CLEAR CLEAR   Specific Gravity, Urine 1.016 1.005 - 1.030   pH 5.0 5.0 - 8.0   Glucose, UA NEGATIVE NEGATIVE mg/dL   Hgb urine dipstick NEGATIVE NEGATIVE   Bilirubin Urine NEGATIVE NEGATIVE   Ketones, ur NEGATIVE NEGATIVE mg/dL   Protein, ur NEGATIVE NEGATIVE mg/dL   Nitrite NEGATIVE NEGATIVE   Leukocytes,Ua NEGATIVE NEGATIVE    Comment: Performed at [facility], [address], [place], [place] [address]  Rapid urine drug screen (hospital performed)     Status: Abnormal   Collection Time: 04/28/23  7:25 PM  Result Value Ref Range   Opiates NONE DETECTED NONE DETECTED   Cocaine POSITIVE (A) NONE DETECTED   Benzodiazepines NONE DETECTED NONE DETECTED   Amphetamines NONE DETECTED NONE DETECTED   Tetrahydrocannabinol NONE DETECTED NONE DETECTED   Barbiturates NONE DETECTED NONE DETECTED    Comment: (NOTE) DRUG SCREEN FOR MEDICAL PURPOSES ONLY.  IF CONFIRMATION IS NEEDED  FOR ANY PURPOSE, NOTIFY LAB WITHIN 5 DAYS.  LOWEST DETECTABLE LIMITS FOR URINE DRUG SCREEN Drug Class                     Cutoff (ng/mL) Amphetamine and metabolites    1000 Barbiturate and metabolites    200 Benzodiazepine                 200 Opiates and metabolites         300 Cocaine and metabolites        300 THC                            50 Performed at [facility], [address], [place], [place] [address]    DG Chest 2 View  Result Date: 04/28/2023 CLINICAL DATA:  Left-sided chest pain. Reproducible on palpation. Nausea and vomiting and cough EXAM: CHEST - 2 VIEW COMPARISON:  10/30/2022 FINDINGS: No change since 10/30/2022. Stable cardiomediastinal silhouette. Aortic atherosclerotic calcification. Small hiatal hernia. Multiple remote displaced left rib fractures. No focal consolidation, pleural effusion, or pneumothorax. IMPRESSION: No active cardiopulmonary disease.  No change from 10/30/2022. Electronically Signed   By: Minerva Fester M.D.   On: 04/28/2023 18:41    Pending Labs Unresulted Labs (From admission, onward)    None       Vitals/Pain Today's Vitals   04/28/23 1745 04/28/23 1940 04/28/23 1953 04/28/23 2000  BP: 131/77  120/87 (!) 130/93  Pulse: 96 87 85 96  Resp: 18 18 19  (!) 26  Temp:   98.5 F (36.9 C)   TempSrc:   Oral   SpO2: 96% 97% 93% 96%  Weight:      Height:      PainSc:        Isolation Precautions No active isolations  Medications Medications  0.9 %  sodium chloride infusion (Manually program via Guardrails IV Fluids) (has no administration in time range)  pantoprazole (PROTONIX) injection 80 mg (80 mg Intravenous Given 04/28/23 1815)    Mobility walks     Focused Assessments Cardiac Assessment Handoff:    Lab Results  Component Value Date   TROPONINI <0.03 10/29/2016   No results found for: "DDIMER" Does the Patient currently have chest pain? No    R Recommendations: See Admitting Provider Note  Report given to:   Additional Notes:

## 2023-04-28 NOTE — ED Notes (Signed)
Pt back from XR

## 2023-04-28 NOTE — ED Triage Notes (Signed)
Left sided chest pain started at 3pm yesterday. Reproducible on palpation  Complains of nausea, vomiting and cough.  Denies diarrhea and fever

## 2023-04-28 NOTE — ED Notes (Signed)
Reminded pt that we needed a urine sample; pt stated he will try to provide a sample later

## 2023-04-28 NOTE — Assessment & Plan Note (Signed)
Stable.  Resume home regimen

## 2023-04-28 NOTE — Assessment & Plan Note (Signed)
Ongoing tobacco abuse, continue Wellbutrin.

## 2023-04-28 NOTE — ED Provider Notes (Signed)
[place] EMERGENCY DEPARTMENT AT [facility] Provider Note   CSN: [id number] Arrival date & time: 04/28/23  1641     History  Chief Complaint  Patient presents with   Chest Pain    Sean Thomas is a 62 y.o. male.  PMH of COPD, degenerative disc disease, depression, anemia. Presents for several days of black stools, states he is getting dizzy and short of breath on exertion started having chest pain last night.  States he feels exactly like he did in the past when he had to have a blood transfusion.  States he was told he might have an ulcer and was told to stop taking NSAIDs which she has but states he still using cocaine reports he has not used in the past several days.  Denies any hematemesis or abdominal pain, no nausea or vomiting or syncope.   Chest Pain      Home Medications Prior to Admission medications   Medication Sig Start Date End Date Taking? Authorizing Provider  albuterol (VENTOLIN HFA) 108 (90 Base) MCG/ACT inhaler Inhale 1-2 puffs into the lungs every 6 (six) hours as needed for wheezing or shortness of breath. 02/16/22  Yes [provider]  buPROPion (WELLBUTRIN SR) 150 MG 12 hr tablet Take 150 mg by mouth 2 (two) times daily.   Yes [provider]  metFORMIN (GLUCOPHAGE) 500 MG tablet Take 500 mg by mouth 2 (two) times daily with a meal. 02/08/23  Yes [provider]  methocarbamol (ROBAXIN) 500 MG tablet Take 500 mg by mouth 2 (two) times daily as needed for muscle spasms. 03/05/23  Yes [provider]  naloxone Jonelle Sports) nasal spray 4 mg/0.1 mL  01/26/23  Yes [provider]  oxyCODONE-acetaminophen (PERCOCET/ROXICET) 5-325 MG tablet Take 1-2 tablets by mouth every 6 (six) hours as needed for moderate pain or severe pain. 02/20/23  Yes [provider]  QUEtiapine (SEROQUEL) 100 MG tablet Take 100 mg by mouth at bedtime. 03/15/23  Yes [provider]  tamsulosin (FLOMAX) 0.4 MG CAPS capsule Take  0.4 mg by mouth daily.   Yes [provider]  Vitamin D, Ergocalciferol, (DRISDOL) 1.25 MG (50000 UNIT) CAPS capsule  03/01/23  Yes [provider]  Aspirin-Caffeine 845-65 MG PACK Take 1 Package by mouth 6 (six) times daily.    [provider]  ferrous sulfate 325 (65 FE) MG EC tablet Take 1 tablet (325 mg total) by mouth daily with breakfast. 08/30/22   Letta Median, PA-C  Fluticasone-Umeclidin-Vilant (TRELEGY ELLIPTA) 100-62.5-25 MCG/INH AEPB Inhale 1 puff into the lungs daily. 11/01/20   Liberty Handy, PA-C  gabapentin (NEURONTIN) 600 MG tablet Take 600 mg by mouth 3 (three) times daily. 03/14/21   [provider]  ipratropium-albuterol (DUONEB) 0.5-2.5 (3) MG/3ML SOLN Inhale 3 mLs into the lungs every 6 (six) hours as needed (shortness of breath). 03/02/22   [provider]  pantoprazole (PROTONIX) 40 MG tablet Take 1 tablet (40 mg total) by mouth 2 (two) times daily before a meal. 08/03/22   Sherryll Burger, Pratik D, DO  predniSONE (DELTASONE) 10 MG tablet Take 10 mg by mouth every morning.    [provider]      Allergies    Acetaminophen, Advil [ibuprofen], and Aleve [naproxen sodium]    Review of Systems   Review of Systems  Cardiovascular:  Positive for chest pain.    Physical Exam Updated Vital Signs BP 131/77   Pulse 96   Temp 98.5 F (  36.9 C)   Resp 18   Ht 6\' 3"  (1.905 m)   Wt 78 kg   SpO2 96%   BMI 21.50 kg/m  Physical Exam Vitals and nursing note reviewed.  Constitutional:      General: He is not in acute distress.    Appearance: He is well-developed.  HENT:     Head: Normocephalic and atraumatic.  Eyes:     Conjunctiva/sclera: Conjunctivae normal.  Cardiovascular:     Rate and Rhythm: Normal rate and regular rhythm.     Heart sounds: No murmur heard. Pulmonary:     Effort: Pulmonary effort is normal. No respiratory distress.     Breath sounds: Normal breath sounds.  Abdominal:     Palpations: Abdomen is  soft.     Tenderness: There is no abdominal tenderness.  Genitourinary:    Rectum: Normal. Guaiac result negative. No mass or tenderness. Normal anal tone.     Comments: Chaperoned by RN Lequita Halt, brown stool Musculoskeletal:        General: No swelling.     Cervical back: Neck supple.  Skin:    General: Skin is warm and dry.     Capillary Refill: Capillary refill takes less than 2 seconds.  Neurological:     Mental Status: He is alert.  Psychiatric:        Mood and Affect: Mood normal.     ED Results / Procedures / Treatments   Labs (all labs ordered are listed, but only abnormal results are displayed) Labs Reviewed  CBC WITH DIFFERENTIAL/PLATELET - Abnormal; Notable for the following components:      Result Value   WBC 13.2 (*)    Hemoglobin 6.5 (*)    HCT 26.8 (*)    MCV 63.2 (*)    MCH 15.3 (*)    MCHC 24.3 (*)    RDW 23.7 (*)    Platelets 485 (*)    Lymphs Abs 5.0 (*)    Monocytes Absolute 1.2 (*)    All other components within normal limits  COMPREHENSIVE METABOLIC PANEL - Abnormal; Notable for the following components:   AST 12 (*)    All other components within normal limits  RESP PANEL BY RT-PCR (RSV, FLU A&B, COVID)  [id number]  URINALYSIS, ROUTINE W REFLEX MICROSCOPIC  RAPID URINE DRUG SCREEN, HOSP PERFORMED  POC OCCULT BLOOD, ED  TYPE AND SCREEN  PREPARE RBC (CROSSMATCH)  TROPONIN I (HIGH SENSITIVITY)  TROPONIN I (HIGH SENSITIVITY)    EKG EKG Interpretation Date/Time:  Saturday April 28 2023 16:56:21 EDT Ventricular Rate:  98 PR Interval:  143 QRS Duration:  93 QT Interval:  344 QTC Calculation: 440 R Axis:   -20  Text Interpretation: Sinus rhythm Borderline left axis deviation Confirmed by Vonita Moss [phone]) on 04/28/2023 5:11:00 PM  Radiology DG Chest 2 View  Result Date: 04/28/2023 CLINICAL DATA:  Left-sided chest pain. Reproducible on palpation. Nausea and vomiting and cough EXAM: CHEST - 2 VIEW COMPARISON:  10/30/2022 FINDINGS: No  change since 10/30/2022. Stable cardiomediastinal silhouette. Aortic atherosclerotic calcification. Small hiatal hernia. Multiple remote displaced left rib fractures. No focal consolidation, pleural effusion, or pneumothorax. IMPRESSION: No active cardiopulmonary disease.  No change from 10/30/2022. Electronically Signed   By: Minerva Fester M.D.   On: 04/28/2023 18:41    Procedures .Critical Care  Performed by: Ma Rings, PA-C Authorized by: Ma Rings, PA-C   Critical care provider statement:    Critical care time (minutes):  30   Critical  care was necessary to treat or prevent imminent or life-threatening deterioration of the following conditions:  Circulatory failure   Critical care was time spent personally by me on the following activities:  Development of treatment plan with patient or surrogate, discussions with consultants, evaluation of patient's response to treatment, examination of patient, ordering and review of laboratory studies, ordering and review of radiographic studies, ordering and performing treatments and interventions, pulse oximetry, re-evaluation of patient's condition, review of old charts and obtaining history from patient or surrogate   Care discussed with comment:  Dr. Jena Gauss, Dr. Mariea Clonts     Medications Ordered in ED Medications  0.9 %  sodium chloride infusion (Manually program via Guardrails IV Fluids) (has no administration in time range)  pantoprazole (PROTONIX) injection 80 mg (80 mg Intravenous Given 04/28/23 1815)    ED Course/ Medical Decision Making/ A&P Clinical Course as of 04/28/23 1921  Sat Apr 28, 2023  1854 Dr Mariea Clonts [RP]    Clinical Course User Index [RP] Rondel Baton, MD                                 Medical Decision Making Ddx: ACS, symptomatic anemia, pneumonia, GI bleeding, other  Course: Patient with history of anemia likely secondary to GI bleeding presents the ER today complaining of exertional dyspnea and  dizziness for several days feels just like when he had blood transfusion in the past, states you having chest pain since last night this relatively constant also having some mild cough, no fevers or chills, no URI symptoms otherwise.  To have black stools with past several days though on exam he has brown stool that is guaiac negative.  Negative for COVID flu RSV, initial troponin is only 4, and pain has been going on since last night EKG shows no acute changes, significant for hemoglobin 6.5, patient has white blood cell count of 13.2.  Patient has noted to be still taking BC powders daily for orthopedic pain site being told not to.  Reports cocaine use with last use about a week ago   Discussed risks and benefits of transfusion with patient for blood transfusion he is agreeable with this, states he has had it before and tolerated it well.  Consults: Discussed with GI Dr. Jena Gauss who clear liquids and then n.p.o. after midnight, agreeable with the Protonix, blood transfusion and admission to hospitalist and he will see patient in morning.  Discussed with Dr. Mariea Clonts for hospitalization.  Amount and/or Complexity of Data Reviewed External Data Reviewed: labs and notes. Labs: ordered. Decision-making details documented in ED Course. Radiology: ordered and independent interpretation performed.    Details: Chest x-ray: No pulmonary edema or infiltrate I agree with radiology read ECG/medicine tests: ordered.  Risk Prescription drug management.           Final Clinical Impression(s) / ED Diagnoses Final diagnoses:  Symptomatic anemia    Rx / DC Orders ED Discharge Orders     None         Josem Kaufmann 04/28/23 1921    Rondel Baton, MD 04/30/23 [phone]

## 2023-04-28 NOTE — Assessment & Plan Note (Addendum)
Atypical chest pain, possible etiologies-GI related versus demand ischemia vs musculoskeletal in the setting of cocaine abuse. Troponin 5> 4.  EKG unchanged from baseline.  UDS positive for cocaine.  Chest x-ray showing multiple remote displaced left rib fractures. -Transfuse

## 2023-04-28 NOTE — ED Notes (Signed)
Blood consent signed

## 2023-04-28 NOTE — ED Notes (Signed)
Pt stated last cocaine use was 1-2 weeks ago Stated that he used it for his chest pain Pt spitting phlegm in triage

## 2023-04-28 NOTE — Assessment & Plan Note (Signed)
Hemoglobin down to 6.5, recent baseline 7-9.  Hemoglobin down to 4.4 earlier this year in January when he was admitted for GI bleed, EGD was without active bleed, but showed findings concerning for candidal esophagitis. -Transfuse 1 unit PRBC  -Trend hemoglobin

## 2023-04-28 NOTE — ED Notes (Signed)
EKG given to MD

## 2023-04-28 NOTE — ED Notes (Signed)
Patient transported to X-ray 

## 2023-04-28 NOTE — ED Notes (Signed)
Pt states that he has had dark stools lately and that he "takes a lot of BC powders for pain"

## 2023-04-28 NOTE — Assessment & Plan Note (Signed)
Stable.  Not on medications. 

## 2023-04-28 NOTE — ED Notes (Signed)
Pt made aware of need for urine specimen

## 2023-04-28 NOTE — Assessment & Plan Note (Signed)
Presenting with symptomatic acute on chronic anemia.  Stool occult negative.  She reports 2 episodes of black stools about 2 days ago.  Taking a lot of Goody powders.  Denies hematemesis or hematochezia. He is not on anticoagulation or antiplatelet.  EGD 1/18 was without active bleeding. -Dr. Jena Gauss, GI aware, will see in consult in a.m. -Clear liquid diet, n.p.o. midnight -IV Protonix 80 mg given in ED, continue 40 twice daily

## 2023-04-28 NOTE — ED Notes (Signed)
POC occult blood negative at this time--PA-C aware and at bedside during test

## 2023-04-28 NOTE — H&P (Signed)
History and Physical    Sean Thomas WUJ:[id number] DOB: [birth year] DOA: 04/28/2023  PCP: Jamey Reas, PA-C   Patient coming from: Home  I have personally briefly reviewed patient's old medical records in [facility]  Chief Complaint: Chest pain  HPI: Sean Thomas is a 62 y.o. male with medical history significant for GI bleed, COPD, GI bleed, BPH. Patient presented to the ED with complaints of persistent chest pain that started at about 3 PM yesterday evening.  Chest pain has been persistent since onset, worse when pressing on his left chest, also reports pain is worse when he swallows.  He reports he last used cocaine a few days ago.  He takes a lot of Goody powders for his chronic back pain for which he has had surgery in the past.  Reports 2 episodes of black stools about 2 days ago.  Reports 1 episode of vomiting today, contained recently ingested food, no blood, no coffee grounds.  He denies abdominal pain.  Reports onset of dizziness today.  ED Course: Stable vitals.  Hemoglobin 6.5.  Stool occult negative.  UDS positive for cocaine.  Troponin 4> 5.  Two-view chest x-ray-no active cardiopulmonary disease.  Multiple remote displaced left rib fractures. 1 unit PRBC ordered for transfusion. EDP talked to Dr. Jena Gauss, clear liquids, n.p.o. after midnight, will see in consult.   Review of Systems: As per HPI all other systems reviewed and negative.  Past Medical History:  Diagnosis Date   Candida esophagitis (HCC)    12/2019; 07/2022   Cervical pain 11/21/2012   COPD (chronic obstructive pulmonary disease) (HCC)    DDD (degenerative disc disease)    Depression    Esophageal dysphagia    Fracture of multiple ribs 11/01/2012   Fracture of occipital condyle (HCC) 11/21/2012   GERD (gastroesophageal reflux disease)    Headache    Hyperlipidemia    Inguinal hernia 12/19/2013   Microcytic anemia    N&V (nausea and vomiting) 05/23/2016    Past Surgical History:   Procedure Laterality Date   BACK SURGERY     COLONOSCOPY WITH PROPOFOL N/A 06/15/2016   RMR: inadequate bowel prep all mucosal surfaces not well seen. 5 mm tubular adenoma removed from the hepatic flexure. 3 mm polyp from the rectum was hyperplastic. He had scattered diverticula   COLONOSCOPY WITH PROPOFOL N/A 08/03/2022   Surgeon: Lanelle Bal, DO; diverticulosis in the sigmoid colon, otherwise normal exam.   ESOPHAGEAL BRUSHING  12/29/2019   Procedure: ESOPHAGEAL BRUSHING;  Surgeon: Corbin Ade, MD;  Location: [facility];  Service: Endoscopy;;   ESOPHAGOGASTRODUODENOSCOPY N/A 08/03/2022   Surgeon: Lanelle Bal, DO; nonsevere Candida esophagitis, esophageal ulcer, 3 centimeter hiatal hernia with a few small Cameron erosions.   ESOPHAGOGASTRODUODENOSCOPY (EGD) WITH PROPOFOL N/A 06/15/2016   Dr. Jena Gauss: Large hiatal hernia, reflux esophagitis, Schatzki ring with focal area of ulceration, status post dilation with 54F Maloney dilator with moderate improvement in luminal narrowing.   ESOPHAGOGASTRODUODENOSCOPY (EGD) WITH PROPOFOL N/A 12/29/2019   Surgeon: Corbin Ade, MD; Candida esophagitis, Schatzki ring status post dilation, medium sized hiatal hernia with a single Cameron lesion.  KOH positive, treated with Diflucan for 21 days.   ESOPHAGOGASTRODUODENOSCOPY (EGD) WITH PROPOFOL N/A 02/22/2022   Surgeon: Corbin Ade, MD; Moderate sized hiatal hernia with Sheria Lang erosions, Schatzki's ring/peptic stricture with ulcer reflux esophagitis s/p dilation   INGUINAL HERNIA REPAIR Left 07/24/2014   Procedure: LAPAROSCOPIC LEFT INGUINAL HERNIA REPAIR ;  Surgeon: Jed Limerick  Derrell Lolling, MD;  Location[facility] OR;  Service: General;  Laterality: Left;   INGUINAL HERNIA REPAIR Right 08/17/2021   Procedure: HERNIA REPAIR INGUINAL ADULT W/ MESH;  Surgeon: Franky Macho, MD;  Location: [facility] ORS;  Service: General;  Laterality: Right;   INSERTION OF MESH Left 07/24/2014   Procedure: INSERTION OF MESH;   Surgeon: Axel Filler, MD;  Location: [facility] OR;  Service: General;  Laterality: Left;   LUMBAR FUSION     MALONEY DILATION N/A 06/15/2016   Procedure: Elease Hashimoto DILATION;  Surgeon: Corbin Ade, MD;  Location: [facility];  Service: Endoscopy;  Laterality: N/A;   MALONEY DILATION N/A 12/29/2019   Procedure: Elease Hashimoto DILATION;  Surgeon: Corbin Ade, MD;  Location: [facility];  Service: Endoscopy;  Laterality: N/A;   MALONEY DILATION N/A 02/22/2022   Procedure: Elease Hashimoto DILATION;  Surgeon: Corbin Ade, MD;  Location: [facility];  Service: Endoscopy;  Laterality: N/A;   POLYPECTOMY  06/15/2016   Procedure: POLYPECTOMY;  Surgeon: Corbin Ade, MD;  Location: [facility];  Service: Endoscopy;;  colon   right lung surgery Right      reports that he has been smoking cigarettes. He has a 45 pack-year smoking history. He has been exposed to tobacco smoke. He has never used smokeless tobacco. He reports current alcohol use of about 9.0 standard drinks of alcohol per week. He reports that he does not currently use drugs after having used the following drugs: Marijuana and Cocaine.  Allergies  Allergen Reactions   Acetaminophen     Pt states he can take this now, without issues   Advil [Ibuprofen] Diarrhea   Aleve [Naproxen Sodium] Diarrhea and Nausea And Vomiting    Pt states he can take this now, without issues    Family History  Problem Relation Age of Onset   Diabetes Mother    Alzheimer's disease Mother    Diabetes Sister    Colon cancer Neg Hx    Liver disease Neg Hx     Prior to Admission medications   Medication Sig Start Date End Date Taking? Authorizing Provider  albuterol (VENTOLIN HFA) 108 (90 Base) MCG/ACT inhaler Inhale 1-2 puffs into the lungs every 6 (six) hours as needed for wheezing or shortness of breath. 02/16/22   [provider]  Aspirin-Caffeine 845-65 MG PACK Take 1 Package by mouth 6 (six) times daily.    [provider]  ferrous  sulfate 325 (65 FE) MG EC tablet Take 1 tablet (325 mg total) by mouth daily with breakfast. 08/30/22   Letta Median, PA-C  Fluticasone-Umeclidin-Vilant (TRELEGY ELLIPTA) 100-62.5-25 MCG/INH AEPB Inhale 1 puff into the lungs daily. Patient not taking: Reported on 12/14/2022 11/01/20   Liberty Handy, PA-C  gabapentin (NEURONTIN) 600 MG tablet Take 600 mg by mouth 3 (three) times daily. 03/14/21   [provider]  ipratropium-albuterol (DUONEB) 0.5-2.5 (3) MG/3ML SOLN Inhale 3 mLs into the lungs every 6 (six) hours as needed (shortness of breath). 03/02/22   [provider]  pantoprazole (PROTONIX) 40 MG tablet Take 1 tablet (40 mg total) by mouth 2 (two) times daily before a meal. 08/03/22   Sherryll Burger, Pratik D, DO  predniSONE (DELTASONE) 10 MG tablet Take 10 mg by mouth every morning.    [provider]  predniSONE (DELTASONE) 20 MG tablet Take 2 tablets (40 mg total) by mouth daily with breakfast. Patient not taking: Reported on 12/14/2022 03/01/22   Glenford Bayley, NP  Physical Exam: Vitals:   04/28/23 1721 04/28/23 1724 04/28/23 1730 04/28/23 1745  BP: 129/81  121/80 131/77  Pulse: (!) 110 96 98 96  Resp: (!) 25 17 (!) 21 18  Temp:      SpO2: 95% 99% 93% 96%  Weight:      Height:        Constitutional: NAD, calm, comfortable Vitals:   04/28/23 1721 04/28/23 1724 04/28/23 1730 04/28/23 1745  BP: 129/81  121/80 131/77  Pulse: (!) 110 96 98 96  Resp: (!) 25 17 (!) 21 18  Temp:      SpO2: 95% 99% 93% 96%  Weight:      Height:       Eyes: PERRL, lids and conjunctivae normal ENMT: Mucous membranes are moist. Neck: normal, supple, no masses, no thyromegaly Respiratory: clear to auscultation bilaterally, no wheezing, no crackles. Normal respiratory effort. No accessory muscle use.  Cardiovascular: Left chest tender to palpation, regular rate and rhythm, no murmurs / rubs / gallops. No extremity edema.  Extremities warm Abdomen: no tenderness, no masses  palpated. No hepatosplenomegaly. Bowel sounds positive.  Musculoskeletal: no clubbing / cyanosis. No joint deformity upper and lower extremities.  Skin: no rashes, lesions, ulcers. No induration Neurologic: No facial asymmetry, moving extremities spontaneously.  Psychiatric: Normal judgment and insight. Alert and oriented x 3. Normal mood.   Labs on Admission: I have personally reviewed following labs and imaging studies  CBC: Recent Labs  Lab 04/28/23 1652  WBC 13.2*  NEUTROABS 6.7  HGB 6.5*  HCT 26.8*  MCV 63.2*  PLT 485*   Basic Metabolic Panel: Recent Labs  Lab 04/28/23 1652  NA 137  K 3.7  CL 106  CO2 22  GLUCOSE 95  BUN 16  CREATININE 0.96  CALCIUM 8.9   GFR: Estimated Creatinine Clearance: 85.8 mL/min (by C-G formula based on SCr of 0.96 mg/dL). Liver Function Tests: Recent Labs  Lab 04/28/23 1652  AST 12*  ALT 10  ALKPHOS 56  BILITOT 0.4  PROT 6.7  ALBUMIN 3.7   Radiological Exams on Admission: DG Chest 2 View  Result Date: 04/28/2023 CLINICAL DATA:  Left-sided chest pain. Reproducible on palpation. Nausea and vomiting and cough EXAM: CHEST - 2 VIEW COMPARISON:  10/30/2022 FINDINGS: No change since 10/30/2022. Stable cardiomediastinal silhouette. Aortic atherosclerotic calcification. Small hiatal hernia. Multiple remote displaced left rib fractures. No focal consolidation, pleural effusion, or pneumothorax. IMPRESSION: No active cardiopulmonary disease.  No change from 10/30/2022. Electronically Signed   By: Minerva Fester M.D.   On: 04/28/2023 18:41    EKG: Independently reviewed.  Sinus rhythm, rate 98, QTc 440.  No significant change from prior.  Assessment/Plan Principal Problem:   Acute on chronic anemia Active Problems:   Chest pain   GI bleed   Cocaine abuse (HCC)   COPD (chronic obstructive pulmonary disease) (HCC)   Rib fractures   Essential hypertension   Chronic respiratory failure with hypoxia (HCC)   Hypertension   Tobacco use  disorder  Assessment and Plan: * Acute on chronic anemia Hemoglobin down to 6.5, recent baseline 7-9.  Hemoglobin down to 4.4 earlier this year in January when he was admitted for GI bleed, EGD was without active bleed, but showed findings concerning for candidal esophagitis. -Transfuse 1 unit PRBC  -Trend hemoglobin  GI bleed Presenting with symptomatic acute on chronic anemia.  Stool occult negative.  She reports 2 episodes of black stools about 2 days ago.  Taking a lot of Welcome  powders.  Denies hematemesis or hematochezia. He is not on anticoagulation or antiplatelet.  EGD 1/18 was without active bleeding. -Dr. Jena Gauss, GI aware, will see in consult in a.m. -Clear liquid diet, n.p.o. midnight -IV Protonix 80 mg given in ED, continue 40 twice daily  Chest pain Atypical chest pain, possible etiologies-GI related versus demand ischemia vs musculoskeletal in the setting of cocaine abuse. Troponin 5> 4.  EKG unchanged from baseline.  UDS positive for cocaine.  Chest x-ray showing multiple remote displaced left rib fractures. -Transfuse  Tobacco use disorder Ongoing tobacco abuse, continue Wellbutrin.   Hypertension Stable.  Not on medications.  COPD (chronic obstructive pulmonary disease) (HCC) Stable.   -Resume home regimen   DVT prophylaxis: SCDS Code Status: FULL Code Family Communication: None at bedside Disposition Plan: ~ 2 days Consults called: GI Admission status: Obs tele    Author: Onnie Boer, MD 04/28/2023 9:28 PM  For on call review [email or web address].

## 2023-04-28 NOTE — ED Notes (Signed)
Pt requested something to drink or ice chips; However, pt is NPO at this time, pt made aware of NPO status

## 2023-04-28 NOTE — ED Notes (Addendum)
Per Carmel Sacramento, PA-C, pt can have clear liquids up unitl midnight tonight. Pt given ice chips and water with ice. Pt informed of update pertaining to NPO status, verbalizes understanding

## 2023-04-29 DIAGNOSIS — K922 Gastrointestinal hemorrhage, unspecified: Secondary | ICD-10-CM

## 2023-04-29 DIAGNOSIS — F17200 Nicotine dependence, unspecified, uncomplicated: Secondary | ICD-10-CM | POA: Diagnosis not present

## 2023-04-29 DIAGNOSIS — D649 Anemia, unspecified: Secondary | ICD-10-CM

## 2023-04-29 DIAGNOSIS — J9611 Chronic respiratory failure with hypoxia: Secondary | ICD-10-CM | POA: Diagnosis not present

## 2023-04-29 DIAGNOSIS — R1310 Dysphagia, unspecified: Secondary | ICD-10-CM

## 2023-04-29 DIAGNOSIS — F1410 Cocaine abuse, uncomplicated: Secondary | ICD-10-CM

## 2023-04-29 LAB — BASIC METABOLIC PANEL
Anion gap: 8 (ref 5–15)
BUN: 11 mg/dL (ref 8–23)
CO2: 23 mmol/L (ref 22–32)
Calcium: 8.5 mg/dL — ABNORMAL LOW (ref 8.9–10.3)
Chloride: 106 mmol/L (ref 98–111)
Creatinine, Ser: 0.83 mg/dL (ref 0.61–1.24)
GFR, Estimated: >60 mL/min (ref >60)
Glucose, Bld: 84 mg/dL (ref 70–99)
Potassium: 3.5 mmol/L (ref 3.5–5.1)
Sodium: 137 mmol/L (ref 135–145)

## 2023-04-29 LAB — CBC
HCT: 29.9 % — ABNORMAL LOW (ref 39.0–52.0)
Hemoglobin: 7.9 g/dL — ABNORMAL LOW (ref 13.0–17.0)
MCH: 17.9 pg — ABNORMAL LOW (ref 26.0–34.0)
MCHC: 26.4 g/dL — ABNORMAL LOW (ref 30.0–36.0)
MCV: 67.6 fL — ABNORMAL LOW (ref 80.0–100.0)
Platelets: 354 10*3/uL (ref 150–400)
RBC: 4.42 MIL/uL (ref 4.22–5.81)
RDW: 26.7 % — ABNORMAL HIGH (ref 11.5–15.5)
WBC: 9.8 10*3/uL (ref 4.0–10.5)
nRBC: 0 % (ref 0.0–0.2)

## 2023-04-29 LAB — PREPARE RBC (CROSSMATCH)

## 2023-04-29 LAB — HEMOGLOBIN AND HEMATOCRIT, BLOOD
HCT: 25.1 % — ABNORMAL LOW (ref 39.0–52.0)
Hemoglobin: 6.5 g/dL — CL (ref 13.0–17.0)

## 2023-04-29 MED ORDER — HYDROCODONE-ACETAMINOPHEN 5-325 MG PO TABS: 1.0000 | ORAL_TABLET | Freq: Four times a day (QID) | ORAL | Status: DC | PRN

## 2023-04-29 MED ORDER — SODIUM CHLORIDE 0.9% IV SOLUTION: Freq: Once | INTRAVENOUS | Status: DC

## 2023-04-29 MED ORDER — OXYCODONE HCL 5 MG PO TABS
5.0000 mg | ORAL_TABLET | Freq: Four times a day (QID) | ORAL | Status: DC | PRN
  Administered 2023-04-29 – 2023-04-30 (×4): 5 mg via ORAL
  Filled 2023-04-29 (×5): qty 1

## 2023-04-29 NOTE — Progress Notes (Signed)
PROGRESS NOTE   Sean Thomas  OZH:[id number] DOB: March 16, 1961 DOA: 04/28/2023 PCP: Jamey Reas, PA-C   Chief Complaint  Patient presents with   Chest Pain   Level of care: Telemetry  Brief Admission History:  62 y.o. male with medical history significant for GI bleed, COPD, GI bleed, BPH.  Patient presented to the ED with complaints of persistent chest pain that started at about 3 PM yesterday evening.  Chest pain has been persistent since onset, worse when pressing on his left chest, also reports pain is worse when he swallows.  He reports he last used cocaine a few days ago.  He takes a lot of Goody powders for his chronic back pain for which he has had surgery in the past.  Reports 2 episodes of black stools about 2 days ago.  Reports 1 episode of vomiting today, contained recently ingested food, no blood, no coffee grounds.  He denies abdominal pain.  Reports onset of dizziness today.   ED Course: Stable vitals.  Hemoglobin 6.5.  Stool occult negative.  UDS positive for cocaine.  Troponin 4> 5.   Two-view chest x-ray-no active cardiopulmonary disease.  Multiple remote displaced left rib fractures.  1 unit PRBC ordered for transfusion.  EDP talked to Dr. Jena Gauss, clear liquids, n.p.o. after midnight, will see in consult.    Assessment and Plan:  Acute on chronic anemia Upper GI bleed Hemoglobin down to 6.5, recent baseline 7-9.  Hemoglobin down to 4.4 earlier this year in January when he was admitted for GI bleed, EGD was without active bleed, but showed findings concerning for candidal esophagitis. -pt reports abusing NSAIDS (Goody Powder) -Transfused 1 unit PRBC on 10/12  -Hg up to 7.9  Upper GI bleed Presenting with symptomatic acute on chronic anemia.  Stool occult negative.  She reports 2 episodes of black stools about 2 days ago.  Taking a lot of Goody powders.  Denies hematemesis or hematochezia. He is not on anticoagulation or antiplatelet.  EGD 1/18 was without active  bleeding. -Dr. Jena Gauss, GI aware, will see in consult  -n.p.o. pending GI eval  -IV pantoprazole 40 mg  twice daily  Tobacco use disorder Ongoing tobacco abuse, continue Wellbutrin.   Hypertension Stable.  Not on medications  Chest pain Atypical chest pain, possible etiologies-GI related versus demand ischemia vs musculoskeletal in the setting of cocaine abuse. Troponin 5> 4.  EKG unchanged from baseline.  UDS positive for cocaine.  Chest x-ray showing multiple remote displaced left rib fractures. -symptoms resolved on IV pantoprazole   COPD (chronic obstructive pulmonary disease) Stable.   -Resume home regimen  Polysubstance abuse - UDS positive for cocaine - also reports abusing Goody Powder - TOC consult for substance abuse counseling  DVT prophylaxis:  SCDs Code Status: Full  Family Communication:  Disposition: home when ok with GI   Consultants:  GI  Procedures:   Antimicrobials:    Subjective: No chest pain this morning.   Objective: Vitals:   04/29/23 0150 04/29/23 0208 04/29/23 0415 04/29/23 0922  BP: 117/76 107/60 110/64   Pulse: 76 81 86   Resp: 16 17 20    Temp: 97.7 F (36.5 C) 98.2 F (36.8 C) 98.9 F (37.2 C)   TempSrc:  Oral Oral   SpO2: 94% 95% 96% 98%  Weight:      Height:        Intake/Output Summary (Last 24 hours) at 04/29/2023 1139 Last data filed at 04/29/2023 0900 Gross per 24 hour  Intake  1182.67 ml  Output 1950 ml  Net -767.33 ml   Filed Weights   04/28/23 1647 04/28/23 2059  Weight: 78 kg 76.9 kg   Examination:  General exam: Appears calm and comfortable  Respiratory system: Clear to auscultation. Respiratory effort normal. Cardiovascular system: normal S1 & S2 heard. No JVD, murmurs, rubs, gallops or clicks. No pedal edema. Gastrointestinal system: Abdomen is nondistended, soft and nontender. No organomegaly or masses felt. Normal bowel sounds heard. Central nervous system: Alert and oriented. No focal neurological  deficits. Extremities: Symmetric 5 x 5 power. Skin: No rashes, lesions or ulcers. Psychiatry: Judgement and insight appear poor. Mood & affect appropriate.   Data Reviewed: I have personally reviewed following labs and imaging studies  CBC: Recent Labs  Lab 04/28/23 1652 04/29/23 0047 04/29/23 0509  WBC 13.2*  --  9.8  NEUTROABS 6.7  --   --   HGB 6.5* 6.5* 7.9*  HCT 26.8* 25.1* 29.9*  MCV 63.2*  --  67.6*  PLT 485*  --  354    Basic Metabolic Panel: Recent Labs  Lab 04/28/23 1652 04/29/23 0509  NA 137 137  K 3.7 3.5  CL 106 106  CO2 22 23  GLUCOSE 95 84  BUN 16 11  CREATININE 0.96 0.83  CALCIUM 8.9 8.5*    CBG: No results for input(s): "GLUCAP" in the last 168 hours.  Recent Results (from the past 240 hour(s))  Resp panel by RT-PCR (RSV, Flu A&B, Covid) Anterior Nasal Swab     Status: None   Collection Time: 04/28/23  5:20 PM   Specimen: Anterior Nasal Swab  Result Value Ref Range Status   SARS Coronavirus 2 by RT PCR NEGATIVE NEGATIVE Final    Comment: (NOTE) SARS-CoV-2 target nucleic acids are NOT DETECTED.  The SARS-CoV-2 RNA is generally detectable in upper respiratory specimens during the acute phase of infection. The lowest concentration of SARS-CoV-2 viral copies this assay can detect is 138 copies/mL. A negative result does not preclude SARS-Cov-2 infection and should not be used as the sole basis for treatment or other patient management decisions. A negative result may occur with  improper specimen collection/handling, submission of specimen other than nasopharyngeal swab, presence of viral mutation(s) within the areas targeted by this assay, and inadequate number of viral copies(<138 copies/mL). A negative result must be combined with clinical observations, patient history, and epidemiological information. The expected result is Negative.  Fact Sheet for Patients:  BloggerCourse.com  Fact Sheet for Healthcare  Providers:  SeriousBroker.it  This test is no t yet approved or cleared by the [place] FDA and  has been authorized for detection and/or diagnosis of SARS-CoV-2 by FDA under an Emergency Use Authorization (EUA). This EUA will remain  in effect (meaning this test can be used) for the duration of the COVID-19 declaration under Section 564(b)(1) of the Act, 21 U.S.C.section 360bbb-3(b)(1), unless the authorization is terminated  or revoked sooner.       Influenza A by PCR NEGATIVE NEGATIVE Final   Influenza B by PCR NEGATIVE NEGATIVE Final    Comment: (NOTE) The Xpert Xpress SARS-CoV-2/FLU/RSV plus assay is intended as an aid in the diagnosis of influenza from Nasopharyngeal swab specimens and should not be used as a sole basis for treatment. Nasal washings and aspirates are unacceptable for Xpert Xpress SARS-CoV-2/FLU/RSV testing.  Fact Sheet for Patients: BloggerCourse.com  Fact Sheet for Healthcare Providers: SeriousBroker.it  This test is not yet approved or cleared by the [place] and  has been authorized for detection and/or diagnosis of SARS-CoV-2 by FDA under an Emergency Use Authorization (EUA). This EUA will remain in effect (meaning this test can be used) for the duration of the COVID-19 declaration under Section 564(b)(1) of the Act, 21 U.S.C. section 360bbb-3(b)(1), unless the authorization is terminated or revoked.     Resp Syncytial Virus by PCR NEGATIVE NEGATIVE Final    Comment: (NOTE) Fact Sheet for Patients: BloggerCourse.com  Fact Sheet for Healthcare Providers: SeriousBroker.it  This test is not yet approved or cleared by the [place] FDA and has been authorized for detection and/or diagnosis of SARS-CoV-2 by FDA under an Emergency Use Authorization (EUA). This EUA will remain in effect (meaning this test can be  used) for the duration of the COVID-19 declaration under Section 564(b)(1) of the Act, 21 U.S.C. section 360bbb-3(b)(1), unless the authorization is terminated or revoked.  Performed at [facility], [address], [place], [place] [address]      Radiology Studies: DG Chest 2 View  Result Date: 04/28/2023 CLINICAL DATA:  Left-sided chest pain. Reproducible on palpation. Nausea and vomiting and cough EXAM: CHEST - 2 VIEW COMPARISON:  10/30/2022 FINDINGS: No change since 10/30/2022. Stable cardiomediastinal silhouette. Aortic atherosclerotic calcification. Small hiatal hernia. Multiple remote displaced left rib fractures. No focal consolidation, pleural effusion, or pneumothorax. IMPRESSION: No active cardiopulmonary disease.  No change from 10/30/2022. Electronically Signed   By: Minerva Fester M.D.   On: 04/28/2023 18:41    Scheduled Meds:  sodium chloride   Intravenous Once   sodium chloride   Intravenous Once   buPROPion  150 mg Oral BID   fluticasone furoate-vilanterol  1 puff Inhalation Daily   And   umeclidinium bromide  1 puff Inhalation Daily   gabapentin  600 mg Oral TID   pantoprazole (PROTONIX) IV  40 mg Intravenous Q12H   tamsulosin  0.4 mg Oral Daily   Continuous Infusions:   LOS: 0 days   Time spent: 50 mins  Aelyn Stanaland Laural Benes, MD How to contact the [facility] Attending or Consulting provider 7A - 7P or covering provider during after hours 7P -7A, for this patient?  Check the care team in [facility] and look for a) attending/consulting [facility] provider listed and b) the [facility] team listed Log into [email or web address] to find provider on call.  Locate the [facility] provider you are looking for under [facility] and page to a number that you can be directly reached. If you still have difficulty reaching the provider, please page the [facility] (Director on Call) for the Hospitalists listed on amion for assistance.  04/29/2023, 11:39 AM

## 2023-04-29 NOTE — Progress Notes (Signed)
Patient has electronic blood consent in chart.

## 2023-04-29 NOTE — Consult Note (Signed)
Referring Provider: No ref. provider found Primary Care Physician:  Jamey Reas, PA-C Primary Gastroenterologist:  Dr.  Jaquita Lautner for Consultation: Melena/anemia  HPI: 63 year old gentleman with steroid-dependent COPD, NSAID and cocaine abuse presented to the ED yesterday with chest pain and fatigue.  Report of black stools over the past couple of days.  ED evaluation revealed hemodynamic stability.  Hemoglobin 6.5.  Interestingly, stool checked in the ED was reportedly hemoccult negative;  urine drug screen positive for cocaine.  Patient admitted for further evaluation.  Transfusion x 1 unit of packed RBCs.  Cardiac workup negative in the ED.  This patient has a lengthy history of GI issues including upper GI bleed secondary to reflux esophagitis, NSAID gastropathy.  Recurrent Candida esophagitis (chronic prednisone).  He denies odynophagia this admission. Known Schatzki's ring with recurrent esophageal dysphagia.  We saw him in May to schedule him for a repeat EGD with possible esophageal dilation.  This never happened.  Last EGD in January -  Candida esophagitis, distal esophageal ulcer,  hiatal hernia with Sheria Lang lesions.  Colonoscopy at the same time demonstrated sigmoid diverticulosis only.  Patient not taking a PPI recently. Chronic intermittent esophageal dysphagia.  Readily admits to using [facility] powders on a regular basis and recent cocaine (latter reportedly used for musculoskeletal pain).  He has remained hemodynamically stable overnight.  Hemoglobin 7.9 this morning.   Past Medical History:  Diagnosis Date   Candida esophagitis (HCC)    12/2019; 07/2022   Cervical pain 11/21/2012   COPD (chronic obstructive pulmonary disease) (HCC)    DDD (degenerative disc disease)    Depression    Esophageal dysphagia    Fracture of multiple ribs 11/01/2012   Fracture of occipital condyle (HCC) 11/21/2012   GERD (gastroesophageal reflux disease)    Headache    Hyperlipidemia     Inguinal hernia 12/19/2013   Microcytic anemia    N&V (nausea and vomiting) 05/23/2016    Past Surgical History:  Procedure Laterality Date   BACK SURGERY     COLONOSCOPY WITH PROPOFOL N/A 06/15/2016   RMR: inadequate bowel prep all mucosal surfaces not well seen. 5 mm tubular adenoma removed from the hepatic flexure. 3 mm polyp from the rectum was hyperplastic. He had scattered diverticula   COLONOSCOPY WITH PROPOFOL N/A 08/03/2022   Surgeon: Lanelle Bal, DO; diverticulosis in the sigmoid colon, otherwise normal exam.   ESOPHAGEAL BRUSHING  12/29/2019   Procedure: ESOPHAGEAL BRUSHING;  Surgeon: Corbin Ade, MD;  Location: [facility];  Service: Endoscopy;;   ESOPHAGOGASTRODUODENOSCOPY N/A 08/03/2022   Surgeon: Lanelle Bal, DO; nonsevere Candida esophagitis, esophageal ulcer, 3 centimeter hiatal hernia with a few small Cameron erosions.   ESOPHAGOGASTRODUODENOSCOPY (EGD) WITH PROPOFOL N/A 06/15/2016   Dr. Jena Gauss: Large hiatal hernia, reflux esophagitis, Schatzki ring with focal area of ulceration, status post dilation with 24F Maloney dilator with moderate improvement in luminal narrowing.   ESOPHAGOGASTRODUODENOSCOPY (EGD) WITH PROPOFOL N/A 12/29/2019   Surgeon: Corbin Ade, MD; Candida esophagitis, Schatzki ring status post dilation, medium sized hiatal hernia with a single Cameron lesion.  KOH positive, treated with Diflucan for 21 days.   ESOPHAGOGASTRODUODENOSCOPY (EGD) WITH PROPOFOL N/A 02/22/2022   Surgeon: Corbin Ade, MD; Moderate sized hiatal hernia with Sheria Lang erosions, Schatzki's ring/peptic stricture with ulcer reflux esophagitis s/p dilation   INGUINAL HERNIA REPAIR Left 07/24/2014   Procedure: LAPAROSCOPIC LEFT INGUINAL HERNIA REPAIR ;  Surgeon: Axel Filler, MD;  Location: [facility] OR;  Service: General;  Laterality:  Left;   INGUINAL HERNIA REPAIR Right 08/17/2021   Procedure: HERNIA REPAIR INGUINAL ADULT W/ MESH;  Surgeon: Franky Macho, MD;  Location:  [facility] ORS;  Service: General;  Laterality: Right;   INSERTION OF MESH Left 07/24/2014   Procedure: INSERTION OF MESH;  Surgeon: Axel Filler, MD;  Location: [facility] OR;  Service: General;  Laterality: Left;   LUMBAR FUSION     MALONEY DILATION N/A 06/15/2016   Procedure: Elease Hashimoto DILATION;  Surgeon: Corbin Ade, MD;  Location: [facility];  Service: Endoscopy;  Laterality: N/A;   MALONEY DILATION N/A 12/29/2019   Procedure: Elease Hashimoto DILATION;  Surgeon: Corbin Ade, MD;  Location: [facility];  Service: Endoscopy;  Laterality: N/A;   MALONEY DILATION N/A 02/22/2022   Procedure: Elease Hashimoto DILATION;  Surgeon: Corbin Ade, MD;  Location: [facility];  Service: Endoscopy;  Laterality: N/A;   POLYPECTOMY  06/15/2016   Procedure: POLYPECTOMY;  Surgeon: Corbin Ade, MD;  Location: [facility];  Service: Endoscopy;;  colon   right lung surgery Right     Prior to Admission medications   Medication Sig Start Date End Date Taking? Authorizing Provider  albuterol (VENTOLIN HFA) 108 (90 Base) MCG/ACT inhaler Inhale 1-2 puffs into the lungs every 6 (six) hours as needed for wheezing or shortness of breath. 02/16/22  Yes [provider]  Aspirin-Caffeine 845-65 MG PACK Take 1-2 Packages by mouth 6 (six) times daily.   Yes [provider]  buPROPion (WELLBUTRIN SR) 150 MG 12 hr tablet Take 150 mg by mouth 2 (two) times daily.   Yes [provider]  Fluticasone-Umeclidin-Vilant (TRELEGY ELLIPTA) 100-62.5-25 MCG/INH AEPB Inhale 1 puff into the lungs daily. 11/01/20  Yes Sharen Heck J, PA-C  gabapentin (NEURONTIN) 600 MG tablet Take 600 mg by mouth 3 (three) times daily. 03/14/21  Yes [provider]  ipratropium-albuterol (DUONEB) 0.5-2.5 (3) MG/3ML SOLN Inhale 3 mLs into the lungs every 6 (six) hours as needed (shortness of breath). 03/02/22  Yes [provider]  metFORMIN (GLUCOPHAGE) 500 MG tablet Take 500 mg by mouth 2 (two) times daily with a meal.  02/08/23  Yes [provider]  naloxone (NARCAN) nasal spray 4 mg/0.1 mL Place 0.4 mg into the nose once. 01/26/23  Yes [provider]  pantoprazole (PROTONIX) 40 MG tablet Take 1 tablet (40 mg total) by mouth 2 (two) times daily before a meal. 08/03/22  Yes Sherryll Burger, Pratik D, DO  predniSONE (DELTASONE) 10 MG tablet Take 10 mg by mouth every morning.   Yes [provider]  tamsulosin (FLOMAX) 0.4 MG CAPS capsule Take 0.4 mg by mouth daily.   Yes [provider]    Current Facility-Administered Medications  Medication Dose Route Frequency Provider Last Rate Last Admin   0.9 %  sodium chloride infusion (Manually program via Guardrails IV Fluids)   Intravenous Once Emokpae, Ejiroghene E, MD       0.9 %  sodium chloride infusion (Manually program via Guardrails IV Fluids)   Intravenous Once Zierle-Ghosh, Asia B, DO       acetaminophen (TYLENOL) tablet 650 mg  650 mg Oral Q6H PRN Emokpae, Ejiroghene E, MD       Or   acetaminophen (TYLENOL) suppository 650 mg  650 mg Rectal Q6H PRN Emokpae, Ejiroghene E, MD       buPROPion (WELLBUTRIN SR) 12 hr tablet 150 mg  150 mg Oral BID Emokpae, Ejiroghene E, MD   150 mg at 04/28/23 2201  fluticasone furoate-vilanterol (BREO ELLIPTA) 100-25 MCG/ACT 1 puff  1 puff Inhalation Daily Emokpae, Ejiroghene E, MD       And   umeclidinium bromide (INCRUSE ELLIPTA) 62.5 MCG/ACT 1 puff  1 puff Inhalation Daily Emokpae, Ejiroghene E, MD       gabapentin (NEURONTIN) capsule 600 mg  600 mg Oral TID Emokpae, Ejiroghene E, MD   600 mg at 04/28/23 2201   HYDROcodone-acetaminophen (NORCO/VICODIN) 5-325 MG per tablet 1 tablet  1 tablet Oral Q6H PRN Emokpae, Ejiroghene E, MD   1 tablet at 04/29/23 0527   ipratropium-albuterol (DUONEB) 0.5-2.5 (3) MG/3ML nebulizer solution 3 mL  3 mL Inhalation Q6H PRN Emokpae, Ejiroghene E, MD       ondansetron (ZOFRAN) tablet 4 mg  4 mg Oral Q6H PRN Emokpae, Ejiroghene E, MD       Or   ondansetron (ZOFRAN)  injection 4 mg  4 mg Intravenous Q6H PRN Emokpae, Ejiroghene E, MD       pantoprazole (PROTONIX) injection 40 mg  40 mg Intravenous Q12H Emokpae, Ejiroghene E, MD   40 mg at 04/29/23 6644   polyethylene glycol (MIRALAX / GLYCOLAX) packet 17 g  17 g Oral Daily PRN Emokpae, Ejiroghene E, MD       tamsulosin (FLOMAX) capsule 0.4 mg  0.4 mg Oral Daily Emokpae, Ejiroghene E, MD        Allergies as of 04/28/2023 - Review Complete 04/28/2023  Allergen Reaction Noted   Advil [ibuprofen] Diarrhea 07/20/2014    Family History  Problem Relation Age of Onset   Diabetes Mother    Alzheimer's disease Mother    Diabetes Sister    Colon cancer Neg Hx    Liver disease Neg Hx     Social History   Socioeconomic History   Marital status: Single    Spouse name: Not on file   Number of children: Not on file   Years of education: Not on file   Highest education level: Not on file  Occupational History   Not on file  Tobacco Use   Smoking status: Every Day    Current packs/day: 1.00    Average packs/day: 1 pack/day for 45.0 years (45.0 ttl pk-yrs)    Types: Cigarettes    Passive exposure: Current   Smokeless tobacco: Never  Vaping Use   Vaping status: Never Used  Substance and Sexual Activity   Alcohol use: Yes    Alcohol/week: 9.0 standard drinks of alcohol    Types: 9 Cans of beer per week    Comment: couple drinks a week per pt as of 02/15/23   Drug use: Not Currently    Types: Marijuana, Cocaine    Comment: Cocaine last week   Sexual activity: Not on file  Other Topics Concern   Not on file  Social History Narrative   Not on file   Social Determinants of Health   Financial Resource Strain: Not on file  Food Insecurity: No Food Insecurity (04/28/2023)   Hunger Vital Sign    Worried About Running Out of Food in the Last Year: Never true    Ran Out of Food in the Last Year: Never true  Transportation Needs: No Transportation Needs (04/28/2023)   PRAPARE - Doctor, general practice (Medical): No    Lack of Transportation (Non-Medical): No  Physical Activity: Not on file  Stress: Not on file  Social Connections: Unknown (11/21/2021)   Received from [facility]   Social  Network    Social Network: Not on file  Intimate Partner Violence: Not At Risk (04/28/2023)   Humiliation, Afraid, Rape, and Kick questionnaire    Fear of Current or Ex-Partner: No    Emotionally Abused: No    Physically Abused: No    Sexually Abused: No    Review of Systems:  As in history of present illness.  Physical Exam: Vital signs in last 24 hours: Temp:  [97.7 F (36.5 C)-98.9 F (37.2 C)] 98.9 F (37.2 C) (10/13 0415) Pulse Rate:  [76-110] 86 (10/13 0415) Resp:  [14-26] 20 (10/13 0415) BP: (104-137)/(60-93) 110/64 (10/13 0415) SpO2:  [93 %-99 %] 96 % (10/13 0415) Weight:  [76.9 kg-78 kg] 76.9 kg (10/12 2059)   General:   Alert,   pleasant and cooperative in NAD; agitated. Head:  Normocephalic and atraumatic. Mouth: Poor dentition.  No thrush.   Neck:  Supple; no masses or thyromegaly. Lungs:  Clear throughout to auscultation.   No wheezes, crackles, or rhonchi. No acute distress. Abdomen:  Soft, nontender and nondistended. No masses, hepatosplenomegaly or hernias noted. Normal bowel sounds, without guarding, and without rebound.    Intake/Output from previous day: 10/12 0701 - 10/13 0700 In: 1182.7 [P.O.:480; Blood:702.7] Out: 1300 [Urine:1300] Intake/Output this shift: No intake/output data recorded.  Lab Results: Recent Labs    04/28/23 1652 04/29/23 0047 04/29/23 0509  WBC 13.2*  --  9.8  HGB 6.5* 6.5* 7.9*  HCT 26.8* 25.1* 29.9*  PLT 485*  --  354   BMET Recent Labs    04/28/23 1652 04/29/23 0509  NA 137 137  K 3.7 3.5  CL 106 106  CO2 22 23  GLUCOSE 95 84  BUN 16 11  CREATININE 0.96 0.83  CALCIUM 8.9 8.5*   LFT Recent Labs    04/28/23 1652  PROT 6.7  ALBUMIN 3.7  AST 12*  ALT 10  ALKPHOS 56  BILITOT 0.4     Impression: 62 year old gentleman with a long history of noncompliance well-known known to our GI practice presenting with transient chest pain, lightheadedness, fatigue -reported melena recently in the setting of ongoing aspirin powder and cocaine use (urine drug screen positive).  Found to be profoundly anemic at 6.5.  Chest pain resolved.  Cardiac workup negative in ED.  He has chronic esophageal dysphagia but no odynophagia at this time.  He has been transfused overnight;  hemoglobin at 7.9 this morning.  Differential includes uncontrolled GERD, NSAID gastropathy/peptic ulcer disease.  History of recurrent Candida esophagitis on chronic steroid therapy but no odynophagia at this time.    Recommendations:  EGD would be the next step in GI evaluation given his presenting symptoms.  However, since he is not actively bleeding and his drug screen is positive for cocaine, this will not be done this admission.  Agree with twice daily PPI therapy.  Full liquid diet.  Hold off on empiric antifungal treatment at this time  Will consider EGD in 2 weeks as an outpatient so long as his urine drug screen is negative for cocaine at that time.  Strongly urge NSAID cessation as well.  Case discussed with anesthesia at length this morning.  We will reassess tomorrow morning.     Notice:  This dictation was prepared with Dragon dictation along with smaller phrase technology. Any transcriptional errors that result from this process are unintentional and may not be corrected upon review.

## 2023-04-29 NOTE — Plan of Care (Signed)

## 2023-04-29 NOTE — Hospital Course (Signed)
62 y.o. male with medical history significant for GI bleed, COPD, GI bleed, BPH.  Patient presented to the ED with complaints of persistent chest pain that started at about 3 PM yesterday evening.  Chest pain has been persistent since onset, worse when pressing on his left chest, also reports pain is worse when he swallows.  He reports he last used cocaine a few days ago.  He takes a lot of Goody powders for his chronic back pain for which he has had surgery in the past.  Reports 2 episodes of black stools about 2 days ago.  Reports 1 episode of vomiting today, contained recently ingested food, no blood, no coffee grounds.  He denies abdominal pain.  Reports onset of dizziness today.   ED Course: Stable vitals.  Hemoglobin 6.5.  Stool occult negative.  UDS positive for cocaine.  Troponin 4> 5.   Two-view chest x-ray-no active cardiopulmonary disease.  Multiple remote displaced left rib fractures.  1 unit PRBC ordered for transfusion.  EDP talked to Dr. Jena Gauss, clear liquids, n.p.o. after midnight, will see in consult.

## 2023-04-29 NOTE — Progress Notes (Signed)
   04/29/23 1351  TOC Brief Assessment  Insurance and Status Reviewed  Patient has primary care physician Yes  Home environment has been reviewed From home  Prior level of function: Independent  Prior/Current Home Services No current home services  Social Determinants of Health Reivew SDOH reviewed no interventions necessary  Readmission risk has been reviewed Yes  Transition of care needs transition of care needs identified, TOC will continue to follow (Adult SA education and treatment information added to AVS.)

## 2023-04-29 NOTE — Progress Notes (Signed)
   04/29/23 0108  Provider Notification  Provider Name/Title Dr Glade Lloyd  Date Provider Notified 04/29/23  Time Provider Notified 0107  Method of Notification Page (secure chat provider preference)  Notification Reason Critical Result  Test performed and critical result HBG 6.5  Date Critical Result Received 04/29/23  Time Critical Result Received 0100

## 2023-04-30 ENCOUNTER — Telehealth: Payer: Self-pay | Admitting: Gastroenterology

## 2023-04-30 DIAGNOSIS — D649 Anemia, unspecified: Secondary | ICD-10-CM | POA: Diagnosis not present

## 2023-04-30 DIAGNOSIS — J9611 Chronic respiratory failure with hypoxia: Secondary | ICD-10-CM | POA: Diagnosis not present

## 2023-04-30 DIAGNOSIS — F17200 Nicotine dependence, unspecified, uncomplicated: Secondary | ICD-10-CM | POA: Diagnosis not present

## 2023-04-30 DIAGNOSIS — F1410 Cocaine abuse, uncomplicated: Secondary | ICD-10-CM | POA: Diagnosis not present

## 2023-04-30 LAB — CBC
HCT: 29.9 % — ABNORMAL LOW (ref 39.0–52.0)
Hemoglobin: 8 g/dL — ABNORMAL LOW (ref 13.0–17.0)
MCH: 18.4 pg — ABNORMAL LOW (ref 26.0–34.0)
MCHC: 26.8 g/dL — ABNORMAL LOW (ref 30.0–36.0)
MCV: 68.9 fL — ABNORMAL LOW (ref 80.0–100.0)
Platelets: 337 10*3/uL (ref 150–400)
RBC: 4.34 MIL/uL (ref 4.22–5.81)
RDW: 27 % — ABNORMAL HIGH (ref 11.5–15.5)
WBC: 8.3 10*3/uL (ref 4.0–10.5)
nRBC: 0 % (ref 0.0–0.2)

## 2023-04-30 LAB — BPAM RBC
Blood Product Expiration Date: 202411112359
Blood Product Expiration Date: 202411042359
ISSUE DATE / TIME: 202410130147
ISSUE DATE / TIME: 202410121959
Unit Type and Rh: 6200
Unit Type and Rh: 6200

## 2023-04-30 LAB — TYPE AND SCREEN
ABO/RH(D): A POS
Antibody Screen: NEGATIVE
Unit division: 0
Unit division: 0

## 2023-04-30 MED ORDER — OXYCODONE-ACETAMINOPHEN 5-325 MG PO TABS
1.0000 | ORAL_TABLET | ORAL | 0 refills | Status: AC | PRN
Start: 2023-04-30 — End: 2023-05-03

## 2023-04-30 NOTE — Plan of Care (Signed)

## 2023-04-30 NOTE — Plan of Care (Signed)

## 2023-04-30 NOTE — Discharge Summary (Signed)
Physician Discharge Summary  Sean Thomas ZOX:[id number] DOB: November 11, 1960 DOA: 04/28/2023  PCP: Jamey Reas, PA-C GI: [facility]   Admit date: 04/28/2023 Discharge date: 04/30/2023  Admitted From:  HOME  Disposition: HOME   Recommendations for Outpatient Follow-up:  Follow up with PCP in 1 weeks Follow up with [facility] in 2 weeks to arrange EGD  Please obtain CBC in one week and UDS in 2 weeks Please avoid all NSAID medications  Discharge Condition: STABLE   CODE STATUS: FULL DIET: resume previous home diet    Brief Hospitalization Summary: Please see all hospital notes, images, labs for full details of the hospitalization. Admission provider HPI:  62 y.o. male with medical history significant for GI bleed, COPD, GI bleed, BPH.  Patient presented to the ED with complaints of persistent chest pain that started at about 3 PM yesterday evening.  Chest pain has been persistent since onset, worse when pressing on his left chest, also reports pain is worse when he swallows.  He reports he last used cocaine a few days ago.  He takes a lot of Goody powders for his chronic back pain for which he has had surgery in the past.  Reports 2 episodes of black stools about 2 days ago.  Reports 1 episode of vomiting today, contained recently ingested food, no blood, no coffee grounds.  He denies abdominal pain.  Reports onset of dizziness today.   ED Course: Stable vitals.  Hemoglobin 6.5.  Stool occult negative.  UDS positive for cocaine.  Troponin 4> 5.   Two-view chest x-ray-no active cardiopulmonary disease.  Multiple remote displaced left rib fractures.  1 unit PRBC ordered for transfusion.  EDP talked to Dr. Jena Gauss, clear liquids, n.p.o. after midnight, will see in consult.   Hospital Course by problem list   Acute on chronic anemia Upper GI bleed Hemoglobin down to 6.5, recent baseline 7-9.  Hemoglobin down to 4.4 earlier this year in January when he was admitted for GI bleed, EGD  was without active bleed, but showed findings concerning for candidal esophagitis. -pt reports abusing NSAIDS (Goody Powder) -Transfused 1 unit PRBC on 10/12  -Hg up to 8.0 -recheck CBC in 1 week recommended   Upper GI bleed Presenting with symptomatic acute on chronic anemia.  Stool occult negative.  She reports 2 episodes of black stools about 2 days ago.  Taking a lot of Goody powders.  Denies hematemesis or hematochezia. He is not on anticoagulation or antiplatelet.  EGD 1/18 was without active bleeding. -Dr. Jena Gauss consulted and due to positive drug screen for cocaine he has recommended patient abstain from all recreational drugs and follow up in 2 weeks for repeat UDS and if clean will plan for EGD at that time, pt verbalizes understanding and plans to stop all recreational drug use.  - pantoprazole 40 mg  twice daily   Tobacco use disorder Ongoing tobacco abuse, continue Wellbutrin. Pt was counseled to stop all tobacco use     Hypertension Stable.  Not on medications   Chest pain Atypical chest pain, possible etiologies-GI related versus demand ischemia vs musculoskeletal in the setting of cocaine abuse. Troponin 5> 4.  EKG unchanged from baseline.  UDS positive for cocaine.  Chest x-ray showing multiple remote displaced left rib fractures. -symptoms resolved on IV pantoprazole    COPD (chronic obstructive pulmonary disease) Stable.   -Resume home regimen   Polysubstance abuse - UDS positive for cocaine - also reports abusing Marlin Canary Powder - TOC consult  for substance abuse counseling  Discharge Diagnoses:  Principal Problem:   Acute on chronic anemia Active Problems:   COPD (chronic obstructive pulmonary disease) (HCC)   Chest pain   Chronic respiratory failure with hypoxia (HCC)   Hypertension   Tobacco use disorder   GI bleed   Cocaine abuse [facility])  Discharge Instructions:  Allergies as of 04/30/2023       Reactions   Advil [ibuprofen] Diarrhea         Medication List     STOP taking these medications    Aspirin-Caffeine 845-65 MG Pack       TAKE these medications    albuterol 108 (90 Base) MCG/ACT inhaler Commonly known as: VENTOLIN HFA Inhale 1-2 puffs into the lungs every 6 (six) hours as needed for wheezing or shortness of breath.   buPROPion 150 MG 12 hr tablet Commonly known as: WELLBUTRIN SR Take 150 mg by mouth 2 (two) times daily.   gabapentin 600 MG tablet Commonly known as: NEURONTIN Take 600 mg by mouth 3 (three) times daily.   ipratropium-albuterol 0.5-2.5 (3) MG/3ML Soln Commonly known as: DUONEB Inhale 3 mLs into the lungs every 6 (six) hours as needed (shortness of breath).   metFORMIN 500 MG tablet Commonly known as: GLUCOPHAGE Take 500 mg by mouth 2 (two) times daily with a meal.   naloxone 4 MG/0.1ML Liqd nasal spray kit Commonly known as: NARCAN Place 0.4 mg into the nose once.   oxyCODONE-acetaminophen 5-325 MG tablet Commonly known as: Percocet Take 1 tablet by mouth every 4 (four) hours as needed for up to 3 days for severe pain.   pantoprazole 40 MG tablet Commonly known as: PROTONIX Take 1 tablet (40 mg total) by mouth 2 (two) times daily before a meal.   predniSONE 10 MG tablet Commonly known as: DELTASONE Take 10 mg by mouth every morning.   tamsulosin 0.4 MG Caps capsule Commonly known as: FLOMAX Take 0.4 mg by mouth daily.   Trelegy Ellipta 100-62.5-25 MCG/INH Aepb Generic drug: Fluticasone-Umeclidin-Vilant Inhale 1 puff into the lungs daily.        Follow-up Information     [facility]. Schedule an appointment as soon as possible for a visit in 2 week(s).   Why: Hospital Follow Up to arrange for EGD Contact information: [address] [place] [address] [phone]        Jamey Reas, PA-C. Schedule an appointment as soon as possible for a visit in 1 week(s).   Specialty: Physician Assistant Contact  information: [address] [place], [place] [address] [phone]                Allergies  Allergen Reactions   Advil [Ibuprofen] Diarrhea   Allergies as of 04/30/2023       Reactions   Advil [ibuprofen] Diarrhea        Medication List     STOP taking these medications    Aspirin-Caffeine 845-65 MG Pack       TAKE these medications    albuterol 108 (90 Base) MCG/ACT inhaler Commonly known as: VENTOLIN HFA Inhale 1-2 puffs into the lungs every 6 (six) hours as needed for wheezing or shortness of breath.   buPROPion 150 MG 12 hr tablet Commonly known as: WELLBUTRIN SR Take 150 mg by mouth 2 (two) times daily.   gabapentin 600 MG tablet Commonly known as: NEURONTIN Take 600 mg by mouth 3 (three) times daily.   ipratropium-albuterol 0.5-2.5 (3) MG/3ML Soln Commonly  known as: DUONEB Inhale 3 mLs into the lungs every 6 (six) hours as needed (shortness of breath).   metFORMIN 500 MG tablet Commonly known as: GLUCOPHAGE Take 500 mg by mouth 2 (two) times daily with a meal.   naloxone 4 MG/0.1ML Liqd nasal spray kit Commonly known as: NARCAN Place 0.4 mg into the nose once.   oxyCODONE-acetaminophen 5-325 MG tablet Commonly known as: Percocet Take 1 tablet by mouth every 4 (four) hours as needed for up to 3 days for severe pain.   pantoprazole 40 MG tablet Commonly known as: PROTONIX Take 1 tablet (40 mg total) by mouth 2 (two) times daily before a meal.   predniSONE 10 MG tablet Commonly known as: DELTASONE Take 10 mg by mouth every morning.   tamsulosin 0.4 MG Caps capsule Commonly known as: FLOMAX Take 0.4 mg by mouth daily.   Trelegy Ellipta 100-62.5-25 MCG/INH Aepb Generic drug: Fluticasone-Umeclidin-Vilant Inhale 1 puff into the lungs daily.        Procedures/Studies: DG Chest 2 View  Result Date: 04/28/2023 CLINICAL DATA:  Left-sided chest pain. Reproducible on palpation. Nausea and vomiting and cough EXAM: CHEST - 2 VIEW  COMPARISON:  10/30/2022 FINDINGS: No change since 10/30/2022. Stable cardiomediastinal silhouette. Aortic atherosclerotic calcification. Small hiatal hernia. Multiple remote displaced left rib fractures. No focal consolidation, pleural effusion, or pneumothorax. IMPRESSION: No active cardiopulmonary disease.  No change from 10/30/2022. Electronically Signed   By: Minerva Fester M.D.   On: 04/28/2023 18:41     Subjective: Pt says he will stop using all recreational drugs and he will follow up in 2 weeks to have a negative urine drug screen to have his EGD done.  He reports that he had no black stools, he feels well and he is eager to go home but will follow up with GI in 2 weeks.    Discharge Exam: Vitals:   04/30/23 0421 04/30/23 0740  BP: 125/76   Pulse: 77   Resp: 19   Temp: 98.9 F (37.2 C)   SpO2:  97%   Vitals:   04/29/23 1357 04/29/23 2042 04/30/23 0421 04/30/23 0740  BP: 119/63 126/80 125/76   Pulse: 90 95 77   Resp: 18 18 19    Temp:  98.2 F (36.8 C) 98.9 F (37.2 C)   TempSrc:  Oral Oral   SpO2: 95% 97%  97%  Weight:      Height:       General: Pt is alert, awake, not in acute distress Cardiovascular: normal S1/S2 +, no rubs, no gallops Respiratory: CTA bilaterally, no wheezing, no rhonchi Abdominal: Soft, NT, ND, bowel sounds + Extremities: no edema, no cyanosis   The results of significant diagnostics from this hospitalization (including imaging, microbiology, ancillary and laboratory) are listed below for reference.    Microbiology: Recent Results (from the past 240 hour(s))  Resp panel by RT-PCR (RSV, Flu A&B, Covid) Anterior Nasal Swab     Status: None   Collection Time: 04/28/23  5:20 PM   Specimen: Anterior Nasal Swab  Result Value Ref Range Status   SARS Coronavirus 2 by RT PCR NEGATIVE NEGATIVE Final    Comment: (NOTE) SARS-CoV-2 target nucleic acids are NOT DETECTED.  The SARS-CoV-2 RNA is generally detectable in upper respiratory specimens during  the acute phase of infection. The lowest concentration of SARS-CoV-2 viral copies this assay can detect is 138 copies/mL. A negative result does not preclude SARS-Cov-2 infection and should not be used as the sole basis for  treatment or other patient management decisions. A negative result may occur with  improper specimen collection/handling, submission of specimen other than nasopharyngeal swab, presence of viral mutation(s) within the areas targeted by this assay, and inadequate number of viral copies(<138 copies/mL). A negative result must be combined with clinical observations, patient history, and epidemiological information. The expected result is Negative.  Fact Sheet for Patients:  BloggerCourse.com  Fact Sheet for Healthcare Providers:  SeriousBroker.it  This test is no t yet approved or cleared by the [place] FDA and  has been authorized for detection and/or diagnosis of SARS-CoV-2 by FDA under an Emergency Use Authorization (EUA). This EUA will remain  in effect (meaning this test can be used) for the duration of the COVID-19 declaration under Section 564(b)(1) of the Act, 21 U.S.C.section 360bbb-3(b)(1), unless the authorization is terminated  or revoked sooner.       Influenza A by PCR NEGATIVE NEGATIVE Final   Influenza B by PCR NEGATIVE NEGATIVE Final    Comment: (NOTE) The Xpert Xpress SARS-CoV-2/FLU/RSV plus assay is intended as an aid in the diagnosis of influenza from Nasopharyngeal swab specimens and should not be used as a sole basis for treatment. Nasal washings and aspirates are unacceptable for Xpert Xpress SARS-CoV-2/FLU/RSV testing.  Fact Sheet for Patients: BloggerCourse.com  Fact Sheet for Healthcare Providers: SeriousBroker.it  This test is not yet approved or cleared by the [place] FDA and has been authorized for detection and/or  diagnosis of SARS-CoV-2 by FDA under an Emergency Use Authorization (EUA). This EUA will remain in effect (meaning this test can be used) for the duration of the COVID-19 declaration under Section 564(b)(1) of the Act, 21 U.S.C. section 360bbb-3(b)(1), unless the authorization is terminated or revoked.     Resp Syncytial Virus by PCR NEGATIVE NEGATIVE Final    Comment: (NOTE) Fact Sheet for Patients: BloggerCourse.com  Fact Sheet for Healthcare Providers: SeriousBroker.it  This test is not yet approved or cleared by the [place] FDA and has been authorized for detection and/or diagnosis of SARS-CoV-2 by FDA under an Emergency Use Authorization (EUA). This EUA will remain in effect (meaning this test can be used) for the duration of the COVID-19 declaration under Section 564(b)(1) of the Act, 21 U.S.C. section 360bbb-3(b)(1), unless the authorization is terminated or revoked.  Performed at [facility], [address] [place], [place] [address]      Labs: BNP (last 3 results) No results for input(s): "BNP" in the last 8760 hours. Basic Metabolic Panel: Recent Labs  Lab 04/28/23 1652 04/29/23 0509  NA 137 137  K 3.7 3.5  CL 106 106  CO2 22 23  GLUCOSE 95 84  BUN 16 11  CREATININE 0.96 0.83  CALCIUM 8.9 8.5*   Liver Function Tests: Recent Labs  Lab 04/28/23 1652  AST 12*  ALT 10  ALKPHOS 56  BILITOT 0.4  PROT 6.7  ALBUMIN 3.7   No results for input(s): "LIPASE", "AMYLASE" in the last 168 hours. No results for input(s): "AMMONIA" in the last 168 hours. CBC: Recent Labs  Lab 04/28/23 1652 04/29/23 0047 04/29/23 0509 04/30/23 0904  WBC 13.2*  --  9.8 8.3  NEUTROABS 6.7  --   --   --   HGB 6.5* 6.5* 7.9* 8.0*  HCT 26.8* 25.1* 29.9* 29.9*  MCV 63.2*  --  67.6* 68.9*  PLT 485*  --  354 337   Cardiac Enzymes: No results for input(s): "CKTOTAL", "CKMB", "CKMBINDEX", "TROPONINI" in the last 168  hours. BNP: Invalid input(s): "POCBNP" CBG: No results for input(s): "GLUCAP" in the last 168 hours. D-Dimer No results for input(s): "DDIMER" in the last 72 hours. Hgb A1c No results for input(s): "HGBA1C" in the last 72 hours. Lipid Profile No results for input(s): "CHOL", "HDL", "LDLCALC", "TRIG", "CHOLHDL", "LDLDIRECT" in the last 72 hours. Thyroid function studies No results for input(s): "TSH", "T4TOTAL", "T3FREE", "THYROIDAB" in the last 72 hours.  Invalid input(s): "FREET3" Anemia work up No results for input(s): "VITAMINB12", "FOLATE", "FERRITIN", "TIBC", "IRON", "RETICCTPCT" in the last 72 hours. Urinalysis    Component Value Date/Time   COLORURINE YELLOW 04/28/2023 1925   APPEARANCEUR CLEAR 04/28/2023 1925   APPEARANCEUR Clear 04/06/2021 1011   LABSPEC 1.016 04/28/2023 1925   PHURINE 5.0 04/28/2023 1925   GLUCOSEU NEGATIVE 04/28/2023 1925   HGBUR NEGATIVE 04/28/2023 1925   BILIRUBINUR NEGATIVE 04/28/2023 1925   BILIRUBINUR Negative 04/06/2021 1011   KETONESUR NEGATIVE 04/28/2023 1925   PROTEINUR NEGATIVE 04/28/2023 1925   NITRITE NEGATIVE 04/28/2023 1925   LEUKOCYTESUR NEGATIVE 04/28/2023 1925   Sepsis Labs Recent Labs  Lab 04/28/23 1652 04/29/23 0509 04/30/23 0904  WBC 13.2* 9.8 8.3   Microbiology Recent Results (from the past 240 hour(s))  Resp panel by RT-PCR (RSV, Flu A&B, Covid) Anterior Nasal Swab     Status: None   Collection Time: 04/28/23  5:20 PM   Specimen: Anterior Nasal Swab  Result Value Ref Range Status   SARS Coronavirus 2 by RT PCR NEGATIVE NEGATIVE Final    Comment: (NOTE) SARS-CoV-2 target nucleic acids are NOT DETECTED.  The SARS-CoV-2 RNA is generally detectable in upper respiratory specimens during the acute phase of infection. The lowest concentration of SARS-CoV-2 viral copies this assay can detect is 138 copies/mL. A negative result does not preclude SARS-Cov-2 infection and should not be used as the sole basis for treatment  or other patient management decisions. A negative result may occur with  improper specimen collection/handling, submission of specimen other than nasopharyngeal swab, presence of viral mutation(s) within the areas targeted by this assay, and inadequate number of viral copies(<138 copies/mL). A negative result must be combined with clinical observations, patient history, and epidemiological information. The expected result is Negative.  Fact Sheet for Patients:  BloggerCourse.com  Fact Sheet for Healthcare Providers:  SeriousBroker.it  This test is no t yet approved or cleared by the [place] FDA and  has been authorized for detection and/or diagnosis of SARS-CoV-2 by FDA under an Emergency Use Authorization (EUA). This EUA will remain  in effect (meaning this test can be used) for the duration of the COVID-19 declaration under Section 564(b)(1) of the Act, 21 U.S.C.section 360bbb-3(b)(1), unless the authorization is terminated  or revoked sooner.       Influenza A by PCR NEGATIVE NEGATIVE Final   Influenza B by PCR NEGATIVE NEGATIVE Final    Comment: (NOTE) The Xpert Xpress SARS-CoV-2/FLU/RSV plus assay is intended as an aid in the diagnosis of influenza from Nasopharyngeal swab specimens and should not be used as a sole basis for treatment. Nasal washings and aspirates are unacceptable for Xpert Xpress SARS-CoV-2/FLU/RSV testing.  Fact Sheet for Patients: BloggerCourse.com  Fact Sheet for Healthcare Providers: SeriousBroker.it  This test is not yet approved or cleared by the [place] FDA and has been authorized for detection and/or diagnosis of SARS-CoV-2 by FDA under an Emergency Use Authorization (EUA). This EUA will remain in effect (meaning this test can be used) for the duration of the COVID-19 declaration under  Section 564(b)(1) of the Act, 21 U.S.C. section  360bbb-3(b)(1), unless the authorization is terminated or revoked.     Resp Syncytial Virus by PCR NEGATIVE NEGATIVE Final    Comment: (NOTE) Fact Sheet for Patients: BloggerCourse.com  Fact Sheet for Healthcare Providers: SeriousBroker.it  This test is not yet approved or cleared by the [place] FDA and has been authorized for detection and/or diagnosis of SARS-CoV-2 by FDA under an Emergency Use Authorization (EUA). This EUA will remain in effect (meaning this test can be used) for the duration of the COVID-19 declaration under Section 564(b)(1) of the Act, 21 U.S.C. section 360bbb-3(b)(1), unless the authorization is terminated or revoked.  Performed at [facility], [address] [place], [place] [address]    Time coordinating discharge: 37 mins  SIGNED:  Standley Dakins, MD  [facility] 04/30/2023, 12:28 PM How to contact the [facility] Attending or Consulting provider 7A - 7P or covering provider during after hours 7P -7A, for this patient?  Check the care team in [facility] and look for a) attending/consulting [facility] provider listed and b) the [facility] team listed Log into [email or web address] and use [place]'s universal password to access. If you do not have the password, please contact the hospital operator. Locate the [facility] provider you are looking for under [facility] and page to a number that you can be directly reached. If you still have difficulty reaching the provider, please page the [facility] (Director on Call) for the Hospitalists listed on amion for assistance.

## 2023-04-30 NOTE — Discharge Instructions (Signed)
IMPORTANT INFORMATION: PAY CLOSE ATTENTION   PHYSICIAN DISCHARGE INSTRUCTIONS  Follow with Primary care provider  Jamey Reas, PA-C  and other consultants as instructed by your Hospitalist Physician  SEEK MEDICAL CARE OR RETURN TO EMERGENCY ROOM IF SYMPTOMS COME BACK, WORSEN OR NEW PROBLEM DEVELOPS   Please note: You were cared for by a hospitalist during your hospital stay. Every effort will be made to forward records to your primary care provider.  You can request that your primary care provider send for your hospital records if they have not received them.  Once you are discharged, your primary care physician will handle any further medical issues. Please note that NO REFILLS for any discharge medications will be authorized once you are discharged, as it is imperative that you return to your primary care physician (or establish a relationship with a primary care physician if you do not have one) for your post hospital discharge needs so that they can reassess your need for medications and monitor your lab values.  Please get a complete blood count and chemistry panel checked by your Primary MD at your next visit, and again as instructed by your Primary MD.  Get Medicines reviewed and adjusted: Please take all your medications with you for your next visit with your Primary MD  Laboratory/radiological data: Please request your Primary MD to go over all hospital tests and procedure/radiological results at the follow up, please ask your primary care provider to get all Hospital records sent to his/her office.  In some cases, they will be blood work, cultures and biopsy results pending at the time of your discharge. Please request that your primary care provider follow up on these results.  If you are diabetic, please bring your blood sugar readings with you to your follow up appointment with primary care.    Please call and make your follow up appointments as soon as possible.     Also Note the following: If you experience worsening of your admission symptoms, develop shortness of breath, life threatening emergency, suicidal or homicidal thoughts you must seek medical attention immediately by calling 911 or calling your MD immediately  if symptoms less severe.  You must read complete instructions/literature along with all the possible adverse reactions/side effects for all the Medicines you take and that have been prescribed to you. Take any new Medicines after you have completely understood and accpet all the possible adverse reactions/side effects.   Do not drive when taking Pain medications or sleeping medications (Benzodiazepines)  Do not take more than prescribed Pain, Sleep and Anxiety Medications. It is not advisable to combine anxiety,sleep and pain medications without talking with your primary care practitioner  Special Instructions: If you have smoked or chewed Tobacco  in the last 2 yrs please stop smoking, stop any regular Alcohol  and or any Recreational drug use.  Wear Seat belts while driving.  Do not drive if taking any narcotic, mind altering or controlled substances or recreational drugs or alcohol.

## 2023-04-30 NOTE — Telephone Encounter (Signed)
Mandy: patient needs a hospital follow-up with me in about 2-3 weeks. Thanks!

## 2023-05-22 ENCOUNTER — Ambulatory Visit: Payer: MEDICAID | Admitting: Gastroenterology

## 2023-05-24 ENCOUNTER — Encounter: Payer: Self-pay | Admitting: Gastroenterology

## 2023-07-06 ENCOUNTER — Encounter (HOSPITAL_COMMUNITY): Payer: Self-pay | Admitting: Emergency Medicine

## 2023-07-06 ENCOUNTER — Telehealth: Payer: Self-pay | Admitting: Gastroenterology

## 2023-07-06 ENCOUNTER — Other Ambulatory Visit: Payer: Self-pay

## 2023-07-06 ENCOUNTER — Emergency Department (HOSPITAL_COMMUNITY): Payer: MEDICAID

## 2023-07-06 ENCOUNTER — Inpatient Hospital Stay (HOSPITAL_COMMUNITY)
Admission: EM | Admit: 2023-07-06 | Discharge: 2023-07-08 | DRG: 812 | Disposition: A | Discharge status: Home / Self Care | Payer: MEDICAID | Attending: Internal Medicine | Admitting: Internal Medicine

## 2023-07-06 DIAGNOSIS — Z886 Allergy status to analgesic agent status: Secondary | ICD-10-CM

## 2023-07-06 DIAGNOSIS — F1120 Opioid dependence, uncomplicated: Secondary | ICD-10-CM | POA: Diagnosis present

## 2023-07-06 DIAGNOSIS — E785 Hyperlipidemia, unspecified: Secondary | ICD-10-CM | POA: Diagnosis present

## 2023-07-06 DIAGNOSIS — F1490 Cocaine use, unspecified, uncomplicated: Secondary | ICD-10-CM

## 2023-07-06 DIAGNOSIS — J449 Chronic obstructive pulmonary disease, unspecified: Secondary | ICD-10-CM | POA: Diagnosis present

## 2023-07-06 DIAGNOSIS — G894 Chronic pain syndrome: Secondary | ICD-10-CM | POA: Diagnosis present

## 2023-07-06 DIAGNOSIS — R0789 Other chest pain: Secondary | ICD-10-CM | POA: Diagnosis present

## 2023-07-06 DIAGNOSIS — Z79899 Other long term (current) drug therapy: Secondary | ICD-10-CM

## 2023-07-06 DIAGNOSIS — K920 Hematemesis: Secondary | ICD-10-CM

## 2023-07-06 DIAGNOSIS — K921 Melena: Secondary | ICD-10-CM | POA: Diagnosis present

## 2023-07-06 DIAGNOSIS — G4700 Insomnia, unspecified: Secondary | ICD-10-CM | POA: Diagnosis present

## 2023-07-06 DIAGNOSIS — D62 Acute posthemorrhagic anemia: Principal | ICD-10-CM | POA: Diagnosis present

## 2023-07-06 DIAGNOSIS — N400 Enlarged prostate without lower urinary tract symptoms: Secondary | ICD-10-CM | POA: Diagnosis present

## 2023-07-06 DIAGNOSIS — F1410 Cocaine abuse, uncomplicated: Secondary | ICD-10-CM | POA: Diagnosis present

## 2023-07-06 DIAGNOSIS — K922 Gastrointestinal hemorrhage, unspecified: Secondary | ICD-10-CM | POA: Diagnosis not present

## 2023-07-06 DIAGNOSIS — I959 Hypotension, unspecified: Secondary | ICD-10-CM | POA: Diagnosis present

## 2023-07-06 DIAGNOSIS — Z7984 Long term (current) use of oral hypoglycemic drugs: Secondary | ICD-10-CM

## 2023-07-06 DIAGNOSIS — Z820 Family history of epilepsy and other diseases of the nervous system: Secondary | ICD-10-CM

## 2023-07-06 DIAGNOSIS — Z7952 Long term (current) use of systemic steroids: Secondary | ICD-10-CM

## 2023-07-06 DIAGNOSIS — K219 Gastro-esophageal reflux disease without esophagitis: Secondary | ICD-10-CM | POA: Diagnosis present

## 2023-07-06 DIAGNOSIS — I10 Essential (primary) hypertension: Secondary | ICD-10-CM | POA: Diagnosis present

## 2023-07-06 DIAGNOSIS — E1165 Type 2 diabetes mellitus with hyperglycemia: Secondary | ICD-10-CM | POA: Diagnosis present

## 2023-07-06 DIAGNOSIS — Z7951 Long term (current) use of inhaled steroids: Secondary | ICD-10-CM

## 2023-07-06 DIAGNOSIS — D649 Anemia, unspecified: Secondary | ICD-10-CM | POA: Diagnosis present

## 2023-07-06 DIAGNOSIS — F32A Depression, unspecified: Secondary | ICD-10-CM | POA: Diagnosis present

## 2023-07-06 DIAGNOSIS — Z981 Arthrodesis status: Secondary | ICD-10-CM

## 2023-07-06 DIAGNOSIS — E119 Type 2 diabetes mellitus without complications: Secondary | ICD-10-CM | POA: Diagnosis present

## 2023-07-06 DIAGNOSIS — F17200 Nicotine dependence, unspecified, uncomplicated: Secondary | ICD-10-CM | POA: Diagnosis present

## 2023-07-06 DIAGNOSIS — F17210 Nicotine dependence, cigarettes, uncomplicated: Secondary | ICD-10-CM | POA: Diagnosis present

## 2023-07-06 DIAGNOSIS — Z833 Family history of diabetes mellitus: Secondary | ICD-10-CM

## 2023-07-06 LAB — COMPREHENSIVE METABOLIC PANEL
ALT: 9 U/L (ref 0–44)
AST: 11 U/L — ABNORMAL LOW (ref 15–41)
Albumin: 3.2 g/dL — ABNORMAL LOW (ref 3.5–5.0)
Alkaline Phosphatase: 42 U/L (ref 38–126)
Anion gap: 9 (ref 5–15)
BUN: 34 mg/dL — ABNORMAL HIGH (ref 8–23)
CO2: 23 mmol/L (ref 22–32)
Calcium: 8.7 mg/dL — ABNORMAL LOW (ref 8.9–10.3)
Chloride: 103 mmol/L (ref 98–111)
Creatinine, Ser: 0.87 mg/dL (ref 0.61–1.24)
GFR, Estimated: >60 mL/min (ref >60)
Glucose, Bld: 151 mg/dL — ABNORMAL HIGH (ref 70–99)
Potassium: 3.6 mmol/L (ref 3.5–5.1)
Sodium: 135 mmol/L (ref 135–145)
Total Bilirubin: 0.2 mg/dL (ref <1.2)
Total Protein: 6.1 g/dL — ABNORMAL LOW (ref 6.5–8.1)

## 2023-07-06 LAB — CBC WITH DIFFERENTIAL/PLATELET
Abs Immature Granulocytes: 0.05 10*3/uL (ref 0.00–0.07)
Basophils Absolute: 0.1 10*3/uL (ref 0.0–0.1)
Basophils Relative: 0 %
Eosinophils Absolute: 0 10*3/uL (ref 0.0–0.5)
Eosinophils Relative: 0 %
HCT: 22 % — ABNORMAL LOW (ref 39.0–52.0)
Hemoglobin: 6 g/dL — CL (ref 13.0–17.0)
Immature Granulocytes: 0 %
Lymphocytes Relative: 29 %
Lymphs Abs: 3.5 10*3/uL (ref 0.7–4.0)
MCH: 19 pg — ABNORMAL LOW (ref 26.0–34.0)
MCHC: 27.3 g/dL — ABNORMAL LOW (ref 30.0–36.0)
MCV: 69.6 fL — ABNORMAL LOW (ref 80.0–100.0)
Monocytes Absolute: 0.8 10*3/uL (ref 0.1–1.0)
Monocytes Relative: 6 %
Neutro Abs: 7.7 10*3/uL (ref 1.7–7.7)
Neutrophils Relative %: 65 %
Platelets: 391 10*3/uL (ref 150–400)
RBC: 3.16 MIL/uL — ABNORMAL LOW (ref 4.22–5.81)
RDW: 21.3 % — ABNORMAL HIGH (ref 11.5–15.5)
WBC: 12.1 10*3/uL — ABNORMAL HIGH (ref 4.0–10.5)
nRBC: 0 % (ref 0.0–0.2)

## 2023-07-06 LAB — RAPID URINE DRUG SCREEN, HOSP PERFORMED
Amphetamines: NOT DETECTED
Amphetamines: NOT DETECTED
Barbiturates: NOT DETECTED
Barbiturates: NOT DETECTED
Benzodiazepines: POSITIVE — AB
Benzodiazepines: POSITIVE — AB
Cocaine: POSITIVE — AB
Cocaine: POSITIVE — AB
Opiates: NOT DETECTED
Opiates: NOT DETECTED
Tetrahydrocannabinol: NOT DETECTED
Tetrahydrocannabinol: NOT DETECTED

## 2023-07-06 LAB — TROPONIN I (HIGH SENSITIVITY)
Troponin I (High Sensitivity): 5 ng/L (ref <18)
Troponin I (High Sensitivity): 3 ng/L (ref <18)

## 2023-07-06 LAB — OCCULT BLOOD, POC DEVICE: Fecal Occult Bld: POSITIVE — AB

## 2023-07-06 LAB — BRAIN NATRIURETIC PEPTIDE: B Natriuretic Peptide: 43 pg/mL (ref 0.0–100.0)

## 2023-07-06 LAB — PREPARE RBC (CROSSMATCH)

## 2023-07-06 MED ORDER — ONDANSETRON HCL 4 MG PO TABS: 4.0000 mg | ORAL_TABLET | Freq: Four times a day (QID) | ORAL | Status: DC | PRN

## 2023-07-06 MED ORDER — TAMSULOSIN HCL 0.4 MG PO CAPS
0.4000 mg | ORAL_CAPSULE | Freq: Every day | ORAL | Status: DC
  Administered 2023-07-06 – 2023-07-08 (×3): 0.4 mg via ORAL
  Filled 2023-07-06 (×3): qty 1

## 2023-07-06 MED ORDER — ACETAMINOPHEN 650 MG RE SUPP: 650.0000 mg | Freq: Four times a day (QID) | RECTAL | Status: DC | PRN

## 2023-07-06 MED ORDER — ACETAMINOPHEN 325 MG PO TABS
650.0000 mg | ORAL_TABLET | Freq: Four times a day (QID) | ORAL | Status: DC | PRN
  Administered 2023-07-06 – 2023-07-08 (×3): 650 mg via ORAL
  Filled 2023-07-06 (×3): qty 2

## 2023-07-06 MED ORDER — PANTOPRAZOLE SODIUM 40 MG IV SOLR
40.0000 mg | Freq: Once | INTRAVENOUS | Status: AC
  Administered 2023-07-06: 40 mg via INTRAVENOUS
  Filled 2023-07-06: qty 10

## 2023-07-06 MED ORDER — ALBUTEROL SULFATE HFA 108 (90 BASE) MCG/ACT IN AERS: 2.0000 | INHALATION_SPRAY | RESPIRATORY_TRACT | Status: DC | PRN

## 2023-07-06 MED ORDER — MELATONIN 3 MG PO TABS: 6.0000 mg | ORAL_TABLET | Freq: Once | ORAL | Status: DC

## 2023-07-06 MED ORDER — GABAPENTIN 300 MG PO CAPS
600.0000 mg | ORAL_CAPSULE | Freq: Three times a day (TID) | ORAL | Status: DC
  Administered 2023-07-06 – 2023-07-08 (×6): 600 mg via ORAL
  Filled 2023-07-06 (×6): qty 2

## 2023-07-06 MED ORDER — UMECLIDINIUM BROMIDE 62.5 MCG/ACT IN AEPB
1.0000 | INHALATION_SPRAY | Freq: Every day | RESPIRATORY_TRACT | Status: DC
  Administered 2023-07-07 – 2023-07-08 (×2): 1 via RESPIRATORY_TRACT
  Filled 2023-07-06: qty 7

## 2023-07-06 MED ORDER — SODIUM CHLORIDE 0.9 % IV BOLUS
1000.0000 mL | Freq: Once | INTRAVENOUS | Status: AC
  Administered 2023-07-06: 1000 mL via INTRAVENOUS

## 2023-07-06 MED ORDER — ALBUTEROL SULFATE (2.5 MG/3ML) 0.083% IN NEBU: 2.5000 mg | INHALATION_SOLUTION | RESPIRATORY_TRACT | Status: DC | PRN

## 2023-07-06 MED ORDER — FLUTICASONE FUROATE-VILANTEROL 100-25 MCG/ACT IN AEPB
1.0000 | INHALATION_SPRAY | Freq: Every day | RESPIRATORY_TRACT | Status: DC
  Administered 2023-07-07 – 2023-07-08 (×2): 1 via RESPIRATORY_TRACT
  Filled 2023-07-06: qty 28

## 2023-07-06 MED ORDER — PANTOPRAZOLE SODIUM 40 MG IV SOLR
40.0000 mg | Freq: Two times a day (BID) | INTRAVENOUS | Status: DC
  Administered 2023-07-06 – 2023-07-08 (×4): 40 mg via INTRAVENOUS
  Filled 2023-07-06 (×4): qty 10

## 2023-07-06 MED ORDER — SODIUM CHLORIDE 0.9% IV SOLUTION
Freq: Once | INTRAVENOUS | Status: AC
  Administered 2023-07-06: 10 mL/h via INTRAVENOUS

## 2023-07-06 MED ORDER — BUPROPION HCL ER (SR) 150 MG PO TB12
150.0000 mg | ORAL_TABLET | Freq: Two times a day (BID) | ORAL | Status: DC
  Administered 2023-07-06 – 2023-07-08 (×4): 150 mg via ORAL
  Filled 2023-07-06 (×4): qty 1

## 2023-07-06 MED ORDER — GABAPENTIN 300 MG PO CAPS
600.0000 mg | ORAL_CAPSULE | Freq: Once | ORAL | Status: AC
  Administered 2023-07-06: 600 mg via ORAL
  Filled 2023-07-06: qty 2

## 2023-07-06 MED ORDER — ONDANSETRON HCL 4 MG/2ML IJ SOLN
4.0000 mg | Freq: Four times a day (QID) | INTRAMUSCULAR | Status: DC | PRN
  Administered 2023-07-06 – 2023-07-08 (×2): 4 mg via INTRAVENOUS
  Filled 2023-07-06 (×2): qty 2

## 2023-07-06 NOTE — Telephone Encounter (Signed)
Please arrange an outpatient follow-up visit for the patient within the next 1-2 weeks to discuss outpatient EGD for coffee-ground emesis and anemia.  He has seen [facility] outpatient with last visit in May.  In October and I have requested that patient see her outpatient after hospital follow-up however he did not make an appointment.  Follow-up can be with Angie Fava, myself, or Dr. Jena Gauss.  CBC in 1 week.  Brooke Bonito, MSN, APRN, FNP-BC, AGACNP-BC [facility] at [facility]

## 2023-07-06 NOTE — ED Triage Notes (Signed)
Pt c/o sob x 3 days. Has been using nebulizer with no relief.

## 2023-07-06 NOTE — ED Provider Notes (Signed)
[facility] MEDICAL SURGICAL UNIT Provider Note   CSN: [id number] Arrival date & time: 07/06/23  8657     History  Chief Complaint  Patient presents with   Shortness of Breath    Sean Thomas is a 62 y.o. male.  Patient complains of vomiting black material for couple days.  Patient has a history of diabetes and bronchospasm  The history is provided by the patient and medical records.  Emesis Severity:  Moderate Timing:  Constant Quality:  Coffee grounds Able to tolerate:  Liquids Progression:  Worsening Chronicity:  New Recent urination:  Normal Relieved by:  Nothing Worsened by:  Nothing Ineffective treatments:  None tried Associated symptoms: no abdominal pain, no cough, no diarrhea and no headaches   Risk factors: alcohol use        Home Medications Prior to Admission medications   Medication Sig Start Date End Date Taking? Authorizing Provider  albuterol (VENTOLIN HFA) 108 (90 Base) MCG/ACT inhaler Inhale 1-2 puffs into the lungs every 6 (six) hours as needed for wheezing or shortness of breath. 02/16/22   [provider]  buPROPion (WELLBUTRIN SR) 150 MG 12 hr tablet Take 150 mg by mouth 2 (two) times daily.    [provider]  Fluticasone-Umeclidin-Vilant (TRELEGY ELLIPTA) 100-62.5-25 MCG/INH AEPB Inhale 1 puff into the lungs daily. 11/01/20   Liberty Handy, PA-C  gabapentin (NEURONTIN) 600 MG tablet Take 600 mg by mouth 3 (three) times daily. 03/14/21   [provider]  ipratropium-albuterol (DUONEB) 0.5-2.5 (3) MG/3ML SOLN Inhale 3 mLs into the lungs every 6 (six) hours as needed (shortness of breath). 03/02/22   [provider]  metFORMIN (GLUCOPHAGE) 500 MG tablet Take 500 mg by mouth 2 (two) times daily with a meal. 02/08/23   [provider]  naloxone (NARCAN) nasal spray 4 mg/0.1 mL Place 0.4 mg into the nose once. 01/26/23   [provider]  pantoprazole (PROTONIX) 40 MG tablet Take 1 tablet (40 mg  total) by mouth 2 (two) times daily before a meal. 08/03/22   Sherryll Burger, Pratik D, DO  predniSONE (DELTASONE) 10 MG tablet Take 10 mg by mouth every morning.    [provider]  tamsulosin (FLOMAX) 0.4 MG CAPS capsule Take 0.4 mg by mouth daily.    [provider]      Allergies    Advil [ibuprofen]    Review of Systems   Review of Systems  Constitutional:  Negative for appetite change and fatigue.  HENT:  Negative for congestion, ear discharge and sinus pressure.   Eyes:  Negative for discharge.  Respiratory:  Negative for cough.   Cardiovascular:  Negative for chest pain.  Gastrointestinal:  Positive for vomiting. Negative for abdominal pain and diarrhea.       Vomiting coffee-ground material  Genitourinary:  Negative for frequency and hematuria.  Musculoskeletal:  Negative for back pain.  Skin:  Negative for rash.  Neurological:  Negative for seizures and headaches.  Psychiatric/Behavioral:  Negative for hallucinations.     Physical Exam Updated Vital Signs BP 108/62   Pulse 84   Temp 98.3 F (36.8 C) (Axillary)   Resp 18   Ht 6\' 3"  (1.905 m)   Wt 79.4 kg   SpO2 93%   BMI 21.87 kg/m  Physical Exam Vitals and nursing note reviewed.  Constitutional:      Appearance: He is well-developed.  HENT:     Head: Normocephalic.     Nose: Nose normal.  Eyes:  General: No scleral icterus.    Conjunctiva/sclera: Conjunctivae normal.  Neck:     Thyroid: No thyromegaly.  Cardiovascular:     Rate and Rhythm: Normal rate and regular rhythm.     Heart sounds: No murmur heard.    No friction rub. No gallop.  Pulmonary:     Breath sounds: No stridor. No wheezing or rales.  Chest:     Chest wall: No tenderness.  Abdominal:     General: There is no distension.     Tenderness: There is no abdominal tenderness. There is no rebound.  Genitourinary:    Comments: Rectal exam shows dark brown stool heme positive Musculoskeletal:        General: Normal range of  motion.     Cervical back: Neck supple.  Lymphadenopathy:     Cervical: No cervical adenopathy.  Skin:    Findings: No erythema or rash.  Neurological:     Mental Status: He is alert and oriented to person, place, and time.     Motor: No abnormal muscle tone.     Coordination: Coordination normal.  Psychiatric:        Behavior: Behavior normal.     ED Results / Procedures / Treatments   Labs (all labs ordered are listed, but only abnormal results are displayed) Labs Reviewed  COMPREHENSIVE METABOLIC PANEL - Abnormal; Notable for the following components:      Result Value   Glucose, Bld 151 (*)    BUN 34 (*)    Calcium 8.7 (*)    Total Protein 6.1 (*)    Albumin 3.2 (*)    AST 11 (*)    All other components within normal limits  CBC WITH DIFFERENTIAL/PLATELET - Abnormal; Notable for the following components:   WBC 12.1 (*)    RBC 3.16 (*)    Hemoglobin 6.0 (*)    HCT 22.0 (*)    MCV 69.6 (*)    MCH 19.0 (*)    MCHC 27.3 (*)    RDW 21.3 (*)    All other components within normal limits  RAPID URINE DRUG SCREEN, HOSP PERFORMED - Abnormal; Notable for the following components:   Cocaine POSITIVE (*)    Benzodiazepines POSITIVE (*)    All other components within normal limits  OCCULT BLOOD, POC DEVICE - Abnormal; Notable for the following components:   Fecal Occult Bld POSITIVE (*)    All other components within normal limits  BRAIN NATRIURETIC PEPTIDE  RAPID URINE DRUG SCREEN, HOSP PERFORMED  BASIC METABOLIC PANEL  CBC  HEMOGLOBIN A1C  TYPE AND SCREEN  PREPARE RBC (CROSSMATCH)  TROPONIN I (HIGH SENSITIVITY)  TROPONIN I (HIGH SENSITIVITY)    EKG None  Radiology DG Chest 2 View Result Date: 07/06/2023 CLINICAL DATA:  Shortness of breath. EXAM: CHEST - 2 VIEW COMPARISON:  April 28, 2023. FINDINGS: The heart size and mediastinal contours are within normal limits. Both lungs are clear. Multiple old left rib fractures are again noted. IMPRESSION: No active  cardiopulmonary disease. Electronically Signed   By: Lupita Raider M.D.   On: 07/06/2023 12:44    Procedures Procedures    Medications Ordered in ED Medications  buPROPion (WELLBUTRIN SR) 12 hr tablet 150 mg (has no administration in time range)  tamsulosin (FLOMAX) capsule 0.4 mg (0.4 mg Oral Given 07/06/23 1608)  gabapentin (NEURONTIN) capsule 600 mg (600 mg Oral Given 07/06/23 1608)  fluticasone furoate-vilanterol (BREO ELLIPTA) 100-25 MCG/ACT 1 puff (has no administration in time range)  And  umeclidinium bromide (INCRUSE ELLIPTA) 62.5 MCG/ACT 1 puff (has no administration in time range)  acetaminophen (TYLENOL) tablet 650 mg (650 mg Oral Given 07/06/23 1519)    Or  acetaminophen (TYLENOL) suppository 650 mg ( Rectal See Alternative 07/06/23 1519)  ondansetron (ZOFRAN) tablet 4 mg (has no administration in time range)    Or  ondansetron (ZOFRAN) injection 4 mg (has no administration in time range)  pantoprazole (PROTONIX) injection 40 mg (has no administration in time range)  albuterol (PROVENTIL) (2.5 MG/3ML) 0.083% nebulizer solution 2.5 mg (has no administration in time range)  sodium chloride 0.9 % bolus 1,000 mL (0 mLs Intravenous Stopped 07/06/23 1410)  pantoprazole (PROTONIX) injection 40 mg (40 mg Intravenous Given 07/06/23 1256)  0.9 %  sodium chloride infusion (Manually program via Guardrails IV Fluids) (10 mL/hr Intravenous New Bag/Given 07/06/23 1411)  gabapentin (NEURONTIN) capsule 600 mg (600 mg Oral Given 07/06/23 1255)    ED Course/ Medical Decision Making/ A&P CRITICAL CARE Performed by: Bethann Berkshire Total critical care time: 44 minutes Critical care time was exclusive of separately billable procedures and treating other patients. Critical care was necessary to treat or prevent imminent or life-threatening deterioration. Critical care was time spent personally by me on the following activities: development of treatment plan with patient and/or surrogate as  well as nursing, discussions with consultants, evaluation of patient's response to treatment, examination of patient, obtaining history from patient or surrogate, ordering and performing treatments and interventions, ordering and review of laboratory studies, ordering and review of radiographic studies, pulse oximetry and re-evaluation of patient's condition.  Click here for ABCD2, HEART and other calculatorsREFRESH Note before signing :1}                              Medical Decision Making Amount and/or Complexity of Data Reviewed Labs: ordered. Radiology: ordered.  Risk Prescription drug management. Decision regarding hospitalization.  This patient presents to the ED for concern of vomiting, this involves an extensive number of treatment options, and is a complaint that carries with it a high risk of complications and morbidity.  The differential diagnosis includes gastroenteritis, ulcer   Co morbidities that complicate the patient evaluation  Substance abuse   Additional history obtained:  Additional history obtained from patient External records from outside source obtained and reviewed including health records   Lab Tests:  I Ordered, and personally interpreted labs.  The pertinent results include: Hemoglobin 6   Imaging Studies ordered:  I ordered imaging studies including chest x-ray I independently visualized and interpreted imaging which showed no negative I agree with the radiologist interpretation   Cardiac Monitoring: / EKG:  The patient was maintained on a cardiac monitor.  I personally viewed and interpreted the cardiac monitored which showed an underlying rhythm of: Normal sinus rhythm   Consultations Obtained:  I requested consultation with the GI and hospitalist,  and discussed lab and imaging findings as well as pertinent plan - they recommend: Hospitalist admit with GI consult   Problem List / ED Course / Critical interventions / Medication  management  GI bleed I ordered medication including packed red blood cells and Protonix Reevaluation of the patient after these medicines showed that the patient improved I have reviewed the patients home medicines and have made adjustments as needed   Social Determinants of Health:  None   Test / Admission - Considered:  None  Patient with an upper GI bleed and  anemia.  He was transfused 2 units packed red blood cells and will be admitted to medicine with GI consult        Final Clinical Impression(s) / ED Diagnoses Final diagnoses:  None    Rx / DC Orders ED Discharge Orders     None         Bethann Berkshire, MD 07/06/23 1732

## 2023-07-06 NOTE — Plan of Care (Signed)

## 2023-07-06 NOTE — H&P (Signed)
History and Physical    Patient: Sean Thomas EAV:[id number] DOB: 26-May-1961 DOA: 07/06/2023 DOS: the patient was seen and examined on 07/06/2023 PCP: Jamey Reas, PA-C  Patient coming from: Home  Chief Complaint:  Chief Complaint  Patient presents with   Shortness of Breath   HPI: Sean Thomas is a 62 year old male with a history of cocaine abuse, GI bleed, hypertension, COPD, tobacco abuse, BPH, and impaired glucose tolerance presenting with shortness of breath and generalized weakness.  The patient states that this has been going on for about 2 days.  He began having nausea and vomiting with coffee-ground material on 07/04/2023.  He also noted black stools during this period of time.  He continues to complain of constant chest pain.  There is no exacerbating or alleviating factors.  He has had some dyspnea on exertion.  He denies any hemoptysis. Notably, the patient was recently mated to the hospital from 04/28/2023 to 04/30/2023 for upper GI bleed and acute blood loss anemia.  His urine drug screen was cocaine positive at that time.  The patient was transfused with 1 unit PRBC..  Endoscopy was deferred at that time secondary to his cocaine.  The patient instructed to follow-up in 2 weeks for repeat UDS prior to determining need for repeat endoscopy.  The patient was discharged home with PPI twice daily.  Unfortunately, the patient continues to use Goody's powders for his chronic pain.  He states that he may have been exposed to cocaine as well, but denies actually using it.  He denies any fevers, chills, headache, neck pain, abdominal pain, hematochezia, dysuria, hematuria. In the ED, the patient was afebrile.  His BP was soft with SBP in the 90s.  WBC 12.1, hemoglobin 6.0, platelets 391.  Sodium 135, potassium 3.6, bicarbonate 23, serum creatinine 0.7.  LFTs were unremarkable.  EKG with sinus tachycardia with nonspecific ST changes.  GI was consulted to assist with management.   FOBT was positive with brown stool.  Review of Systems: As mentioned in the history of present illness. All other systems reviewed and are negative. Past Medical History:  Diagnosis Date   Candida esophagitis (HCC)    12/2019; 07/2022   Cervical pain 11/21/2012   COPD (chronic obstructive pulmonary disease) (HCC)    DDD (degenerative disc disease)    Depression    Esophageal dysphagia    Fracture of multiple ribs 11/01/2012   Fracture of occipital condyle (HCC) 11/21/2012   GERD (gastroesophageal reflux disease)    Headache    Hyperlipidemia    Inguinal hernia 12/19/2013   Microcytic anemia    N&V (nausea and vomiting) 05/23/2016   Past Surgical History:  Procedure Laterality Date   BACK SURGERY     COLONOSCOPY WITH PROPOFOL N/A 06/15/2016   RMR: inadequate bowel prep all mucosal surfaces not well seen. 5 mm tubular adenoma removed from the hepatic flexure. 3 mm polyp from the rectum was hyperplastic. He had scattered diverticula   COLONOSCOPY WITH PROPOFOL N/A 08/03/2022   Surgeon: Lanelle Bal, DO; diverticulosis in the sigmoid colon, otherwise normal exam.   ESOPHAGEAL BRUSHING  12/29/2019   Procedure: ESOPHAGEAL BRUSHING;  Surgeon: Corbin Ade, MD;  Location: [facility];  Service: Endoscopy;;   ESOPHAGOGASTRODUODENOSCOPY N/A 08/03/2022   Surgeon: Lanelle Bal, DO; nonsevere Candida esophagitis, esophageal ulcer, 3 centimeter hiatal hernia with a few small Cameron erosions.   ESOPHAGOGASTRODUODENOSCOPY (EGD) WITH PROPOFOL N/A 06/15/2016   Dr. Jena Gauss: Large hiatal hernia, reflux esophagitis, Schatzki  ring with focal area of ulceration, status post dilation with 18F Maloney dilator with moderate improvement in luminal narrowing.   ESOPHAGOGASTRODUODENOSCOPY (EGD) WITH PROPOFOL N/A 12/29/2019   Surgeon: Corbin Ade, MD; Candida esophagitis, Schatzki ring status post dilation, medium sized hiatal hernia with a single Cameron lesion.  KOH positive, treated with  Diflucan for 21 days.   ESOPHAGOGASTRODUODENOSCOPY (EGD) WITH PROPOFOL N/A 02/22/2022   Surgeon: Corbin Ade, MD; Moderate sized hiatal hernia with Sheria Lang erosions, Schatzki's ring/peptic stricture with ulcer reflux esophagitis s/p dilation   INGUINAL HERNIA REPAIR Left 07/24/2014   Procedure: LAPAROSCOPIC LEFT INGUINAL HERNIA REPAIR ;  Surgeon: Axel Filler, MD;  Location: [facility] OR;  Service: General;  Laterality: Left;   INGUINAL HERNIA REPAIR Right 08/17/2021   Procedure: HERNIA REPAIR INGUINAL ADULT W/ MESH;  Surgeon: Franky Macho, MD;  Location: [facility] ORS;  Service: General;  Laterality: Right;   INSERTION OF MESH Left 07/24/2014   Procedure: INSERTION OF MESH;  Surgeon: Axel Filler, MD;  Location: [facility] OR;  Service: General;  Laterality: Left;   LUMBAR FUSION     MALONEY DILATION N/A 06/15/2016   Procedure: Elease Hashimoto DILATION;  Surgeon: Corbin Ade, MD;  Location: [facility];  Service: Endoscopy;  Laterality: N/A;   MALONEY DILATION N/A 12/29/2019   Procedure: Elease Hashimoto DILATION;  Surgeon: Corbin Ade, MD;  Location: [facility];  Service: Endoscopy;  Laterality: N/A;   MALONEY DILATION N/A 02/22/2022   Procedure: Elease Hashimoto DILATION;  Surgeon: Corbin Ade, MD;  Location: [facility];  Service: Endoscopy;  Laterality: N/A;   POLYPECTOMY  06/15/2016   Procedure: POLYPECTOMY;  Surgeon: Corbin Ade, MD;  Location: [facility];  Service: Endoscopy;;  colon   right lung surgery Right    Social History:  reports that he has been smoking cigarettes. He has a 45 pack-year smoking history. He has been exposed to tobacco smoke. He has never used smokeless tobacco. He reports current alcohol use of about 9.0 standard drinks of alcohol per week. He reports that he does not currently use drugs after having used the following drugs: Marijuana and Cocaine.  Allergies  Allergen Reactions   Advil [Ibuprofen] Diarrhea    Family History  Problem Relation Age of Onset   Diabetes  Mother    Alzheimer's disease Mother    Diabetes Sister    Colon cancer Neg Hx    Liver disease Neg Hx     Prior to Admission medications   Medication Sig Start Date End Date Taking? Authorizing Provider  albuterol (VENTOLIN HFA) 108 (90 Base) MCG/ACT inhaler Inhale 1-2 puffs into the lungs every 6 (six) hours as needed for wheezing or shortness of breath. 02/16/22   [provider]  buPROPion (WELLBUTRIN SR) 150 MG 12 hr tablet Take 150 mg by mouth 2 (two) times daily.    [provider]  Fluticasone-Umeclidin-Vilant (TRELEGY ELLIPTA) 100-62.5-25 MCG/INH AEPB Inhale 1 puff into the lungs daily. 11/01/20   Liberty Handy, PA-C  gabapentin (NEURONTIN) 600 MG tablet Take 600 mg by mouth 3 (three) times daily. 03/14/21   [provider]  ipratropium-albuterol (DUONEB) 0.5-2.5 (3) MG/3ML SOLN Inhale 3 mLs into the lungs every 6 (six) hours as needed (shortness of breath). 03/02/22   [provider]  metFORMIN (GLUCOPHAGE) 500 MG tablet Take 500 mg by mouth 2 (two) times daily with a meal. 02/08/23   [provider]  naloxone (NARCAN) nasal spray 4 mg/0.1 mL Place 0.4 mg into  the nose once. 01/26/23   [provider]  pantoprazole (PROTONIX) 40 MG tablet Take 1 tablet (40 mg total) by mouth 2 (two) times daily before a meal. 08/03/22   Sherryll Burger, Pratik D, DO  predniSONE (DELTASONE) 10 MG tablet Take 10 mg by mouth every morning.    [provider]  tamsulosin (FLOMAX) 0.4 MG CAPS capsule Take 0.4 mg by mouth daily.    [provider]    Physical Exam: Vitals:   07/06/23 1033 07/06/23 1036 07/06/23 1230 07/06/23 1245  BP:  101/66 (!) 96/51 (!) 125/50  Pulse:   (!) 105 (!) 106  Resp:   20 17  Temp:      SpO2:   93% 95%  Weight: 79.4 kg     Height: 6\' 3"  (1.905 m)      GENERAL:  A&O x 3, NAD, well developed, cooperative, follows commands HEENT: [place]/AT, No thrush, No icterus, No oral ulcers Neck:  No neck mass, No meningismus,  soft, supple CV: RRR, no S3, no S4, no rub, no JVD Lungs:  bibasilar rales. No wheeze Abd: soft/NT +BS, nondistended Ext: No edema, no lymphangitis, no cyanosis, no rashes Neuro:  CN II-XII intact, strength 4/5 in RUE, RLE, strength 4/5 LUE, LLE; sensation intact bilateral; no dysmetria; babinski equivocal  Data Reviewed: Data reviewed above in history  Assessment and Plan: Coffee-ground emesis/FOBT positive/acute on chronic anemia/symptomatic anemia -Unfortunately, the patient continues to use Goody's powders -Obtain UDS -Start pantoprazole IV twice daily -GI consulted -Clear liquid diet -Patient presented with hemoglobin 6.0 -Transfused 2 units PRBC  Atypical chest pain -Cycle troponins 5>>3 -Personally reviewed EKG--sinus rhythm, nonspecific ST change  Tobacco use disorder -Tobacco cessation discussed -Continue Wellbutrin  Polysubstance abuse -Patient continues to use NSAIDs, intermittent alcohol, and cocaine  Chronic pain syndrome/opioid dependence -Percocet 5/325, #180, last refill 02/20/2023 -Percocet 5.325, #12, last refill 04/30/2023 -Continue gabapentin    Advance Care Planning: FULL  Consults: GI  Family Communication: none  Severity of Illness: The appropriate patient status for this patient is OBSERVATION. Observation status is judged to be reasonable and necessary in order to provide the required intensity of service to ensure the patient's safety. The patient's presenting symptoms, physical exam findings, and initial radiographic and laboratory data in the context of their medical condition is felt to place them at decreased risk for further clinical deterioration. Furthermore, it is anticipated that the patient will be medically stable for discharge from the hospital within 2 midnights of admission.   Author: Catarina Hartshorn, MD 07/06/2023 1:27 PM  For on call review [email or web address].

## 2023-07-06 NOTE — ED Notes (Signed)
Date and time results received: 07/06/23 12:07 PM  (use smartphrase ".now" to insert current time)  Test: Hgb  Critical Value: 6.0  Name of Provider Notified: Dr. Estell Harpin  Orders Received? Or Actions Taken?:

## 2023-07-06 NOTE — Hospital Course (Signed)
62 year old male with a history of cocaine abuse, GI bleed, hypertension, COPD, tobacco abuse, BPH, and impaired glucose tolerance presenting with shortness of breath and generalized weakness.  The patient states that this has been going on for about 2 days.  He began having nausea and vomiting with coffee-ground material on 07/04/2023.  He also noted black stools during this period of time.  He continues to complain of constant chest pain.  There is no exacerbating or alleviating factors.  He has had some dyspnea on exertion.  He denies any hemoptysis. Notably, the patient was recently mated to the hospital from 04/28/2023 to 04/30/2023 for upper GI bleed and acute blood loss anemia.  His urine drug screen was cocaine positive at that time.  The patient was transfused with 1 unit PRBC..  Endoscopy was deferred at that time secondary to his cocaine.  The patient instructed to follow-up in 2 weeks for repeat UDS prior to determining need for repeat endoscopy.  The patient was discharged home with PPI twice daily.  Unfortunately, the patient continues to use Goody's powders for his chronic pain.  He states that he may have been exposed to cocaine as well, but denies actually using it.  He denies any fevers, chills, headache, neck pain, abdominal pain, hematochezia, dysuria, hematuria. In the ED, the patient was afebrile.  His BP was soft with SBP in the 90s.  WBC 12.1, hemoglobin 6.0, platelets 391.  Sodium 135, potassium 3.6, bicarbonate 23, serum creatinine 0.7.  LFTs were unremarkable.  EKG with sinus tachycardia with nonspecific ST changes.  GI was consulted to assist with management.  FOBT was positive with brown stool.

## 2023-07-06 NOTE — Consult Note (Signed)
Gastroenterology Consult   Referring Provider: No ref. provider found Primary Care Physician:  Jamey Reas, PA-C Primary Gastroenterologist:  Gerrit Friends.Rourk, MD   Patient ID: Sean Thomas; [id number]; 12-18-1960   Admit date: 07/06/2023  LOS: 0 days   Date of Consultation: 07/06/2023  Reason for Consultation:  coffee ground emesis, acute anemia  History of Present Illness   Sean Thomas is a 62 y.o. year old male with history of COPD, HTN, HLD, IDA, dysphagia, Candida esophagitis, hiatal hernia with Sheria Lang lesions in 2021, and adenomatous colon polyps.  Multiple prior hospitalizations this year with anemia and found to have Candida esophagitis, esophageal ulcer, and Cameron erosions.  Patient presented to the ED today for complaints of dyspnea for 3 days.  Found to be anemic on labs.  GI consulted for further evaluation.   ED evaluation: Labs -hemoglobin 6, MCV 69.6, platelets 391, WBC 12.1.  Normal LFTs, low albumin.  Potassium 3.6.  BNP 43 Initially hypotensive with BP 85/53 with pulse 114. UDS ordered, not yet collected. Type and screen in process CXR pending  Consult:  Patient reports he began having emesis on Wednesday.  He states he has filled up a small trash can at home with emesis.  He reports has been a dark green and eventually a dark black color.  He reports yesterday afternoon he began feeling short of breath.  He denies any dysphagia currently.  He reports only intermittent use of BC/Goody powders for extreme pain in his hand and takes Aleve or Advil as needed for back and leg pain.  He denies frequent alcohol use, usually about a sixpack of beer per month.  Also states for the last 3 days he has only used about 6 cigarettes in 3 days.  In the past he has smoked up to 3 packs/day.  He denies any regular drug use.  He also denies any recent cocaine use however he states he has been with partners who have used cocaine.  He reports maybe a month ago having a  small amount of marijuana.  Currently denies any abdominal pain, only having groin and right leg pain as well as left shoulder pain.  He states he has been having significant acid reflux at home and states that he has been unable to get a refill of his heartburn medication from his PCP and has been eating Rolaids regularly.  He more so is complaining of his insomnia days.   Patient hospitalized in January of this year and found to have nonsevere Candida esophagitis, esophageal ulcer, 3cm hiatal hernia with a few small Cameron erosions on EGD.  Colonoscopy with diverticulosis and otherwise normal exam.  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  Admission in October where he presented with melena and anemia as well with hemoglobin 6.5.  Stool was Hemoccult negative in the ED at that time and UDS was positive for cocaine.  He received 1 unit PRBC and had negative cardiac workup.  He was recommended to have outpatient EGD given no active bleeding at the time and positive drug screen.  He was advised twice daily PPI and strong encouragement for NSAID and cocaine cessation.  Patient ultimately never followed up in the office after October hospitalization.  Last outpatient office visit in May.  Past Medical History:  Diagnosis  Date   Candida esophagitis (HCC)    12/2019; 07/2022   Cervical pain 11/21/2012   COPD (chronic obstructive pulmonary disease) (HCC)    DDD (degenerative disc disease)    Depression    Esophageal dysphagia    Fracture of multiple ribs 11/01/2012   Fracture of occipital condyle (HCC) 11/21/2012   GERD (gastroesophageal reflux disease)    Headache    Hyperlipidemia    Inguinal hernia 12/19/2013   Microcytic anemia    N&V (nausea and vomiting) 05/23/2016    Past Surgical  History:  Procedure Laterality Date   BACK SURGERY     COLONOSCOPY WITH PROPOFOL N/A 06/15/2016   RMR: inadequate bowel prep all mucosal surfaces not well seen. 5 mm tubular adenoma removed from the hepatic flexure. 3 mm polyp from the rectum was hyperplastic. He had scattered diverticula   COLONOSCOPY WITH PROPOFOL N/A 08/03/2022   Surgeon: Lanelle Bal, DO; diverticulosis in the sigmoid colon, otherwise normal exam.   ESOPHAGEAL BRUSHING  12/29/2019   Procedure: ESOPHAGEAL BRUSHING;  Surgeon: Corbin Ade, MD;  Location: [facility];  Service: Endoscopy;;   ESOPHAGOGASTRODUODENOSCOPY N/A 08/03/2022   Surgeon: Lanelle Bal, DO; nonsevere Candida esophagitis, esophageal ulcer, 3 centimeter hiatal hernia with a few small Cameron erosions.   ESOPHAGOGASTRODUODENOSCOPY (EGD) WITH PROPOFOL N/A 06/15/2016   Dr. Jena Gauss: Large hiatal hernia, reflux esophagitis, Schatzki ring with focal area of ulceration, status post dilation with 11F Maloney dilator with moderate improvement in luminal narrowing.   ESOPHAGOGASTRODUODENOSCOPY (EGD) WITH PROPOFOL N/A 12/29/2019   Surgeon: Corbin Ade, MD; Candida esophagitis, Schatzki ring status post dilation, medium sized hiatal hernia with a single Cameron lesion.  KOH positive, treated with Diflucan for 21 days.   ESOPHAGOGASTRODUODENOSCOPY (EGD) WITH PROPOFOL N/A 02/22/2022   Surgeon: Corbin Ade, MD; Moderate sized hiatal hernia with Sheria Lang erosions, Schatzki's ring/peptic stricture with ulcer reflux esophagitis s/p dilation   INGUINAL HERNIA REPAIR Left 07/24/2014   Procedure: LAPAROSCOPIC LEFT INGUINAL HERNIA REPAIR ;  Surgeon: Axel Filler, MD;  Location: [facility] OR;  Service: General;  Laterality: Left;   INGUINAL HERNIA REPAIR Right 08/17/2021   Procedure: HERNIA REPAIR INGUINAL ADULT W/ MESH;  Surgeon: Franky Macho, MD;  Location: [facility];  Service: General;  Laterality: Right;   INSERTION OF MESH Left 07/24/2014   Procedure: INSERTION  OF MESH;  Surgeon: Axel Filler, MD;  [facility];  Service: General;  Laterality: Left;   LUMBAR FUSION     MALONEY DILATION N/A 06/15/2016   Procedure: Elease Hashimoto DILATION;  Surgeon: Corbin Ade, MD;  Location: [facility];  Service: Endoscopy;  Laterality: N/A;   MALONEY DILATION N/A 12/29/2019   Procedure: Elease Hashimoto DILATION;  Surgeon: Corbin Ade, MD;  Location: [facility];  Service: Endoscopy;  Laterality: N/A;   MALONEY DILATION N/A 02/22/2022   Procedure: Elease Hashimoto DILATION;  Surgeon: Corbin Ade, MD;  Location: [facility];  Service: Endoscopy;  Laterality: N/A;   POLYPECTOMY  06/15/2016   Procedure: POLYPECTOMY;  Surgeon: Corbin Ade, MD;  Location: [facility];  Service: Endoscopy;;  colon   right lung surgery Right     Prior to Admission medications   Medication Sig Start Date End Date Taking? Authorizing Provider  albuterol (VENTOLIN HFA) 108 (90 Base) MCG/ACT inhaler Inhale 1-2 puffs into the lungs every 6 (six) hours as needed for wheezing or shortness of breath. 02/16/22   [provider]  buPROPion (WELLBUTRIN SR) 150 MG 12 hr tablet  Take 150 mg by mouth 2 (two) times daily.    [provider]  Fluticasone-Umeclidin-Vilant (TRELEGY ELLIPTA) 100-62.5-25 MCG/INH AEPB Inhale 1 puff into the lungs daily. 11/01/20   Liberty Handy, PA-C  gabapentin (NEURONTIN) 600 MG tablet Take 600 mg by mouth 3 (three) times daily. 03/14/21   [provider]  ipratropium-albuterol (DUONEB) 0.5-2.5 (3) MG/3ML SOLN Inhale 3 mLs into the lungs every 6 (six) hours as needed (shortness of breath). 03/02/22   [provider]  metFORMIN (GLUCOPHAGE) 500 MG tablet Take 500 mg by mouth 2 (two) times daily with a meal. 02/08/23   [provider]  naloxone (NARCAN) nasal spray 4 mg/0.1 mL Place 0.4 mg into the nose once. 01/26/23   [provider]  pantoprazole (PROTONIX) 40 MG tablet Take 1 tablet (40 mg total) by mouth 2 (two) times  daily before a meal. 08/03/22   Sherryll Burger, Pratik D, DO  predniSONE (DELTASONE) 10 MG tablet Take 10 mg by mouth every morning.    [provider]  tamsulosin (FLOMAX) 0.4 MG CAPS capsule Take 0.4 mg by mouth daily.    [provider]    Current Facility-Administered Medications  Medication Dose Route Frequency Provider Last Rate Last Admin   0.9 %  sodium chloride infusion (Manually program via Guardrails IV Fluids)   Intravenous Once Bethann Berkshire, MD       albuterol (VENTOLIN HFA) 108 (90 Base) MCG/ACT inhaler 2 puff  2 puff Inhalation Q2H PRN Bethann Berkshire, MD       pantoprazole (PROTONIX) injection 40 mg  40 mg Intravenous Once Bethann Berkshire, MD       sodium chloride 0.9 % bolus 1,000 mL  1,000 mL Intravenous Once Bethann Berkshire, MD       Current Outpatient Medications  Medication Sig Dispense Refill   albuterol (VENTOLIN HFA) 108 (90 Base) MCG/ACT inhaler Inhale 1-2 puffs into the lungs every 6 (six) hours as needed for wheezing or shortness of breath.     buPROPion (WELLBUTRIN SR) 150 MG 12 hr tablet Take 150 mg by mouth 2 (two) times daily.     Fluticasone-Umeclidin-Vilant (TRELEGY ELLIPTA) 100-62.5-25 MCG/INH AEPB Inhale 1 puff into the lungs daily. 28 each 0   gabapentin (NEURONTIN) 600 MG tablet Take 600 mg by mouth 3 (three) times daily.     ipratropium-albuterol (DUONEB) 0.5-2.5 (3) MG/3ML SOLN Inhale 3 mLs into the lungs every 6 (six) hours as needed (shortness of breath).     metFORMIN (GLUCOPHAGE) 500 MG tablet Take 500 mg by mouth 2 (two) times daily with a meal.     naloxone (NARCAN) nasal spray 4 mg/0.1 mL Place 0.4 mg into the nose once.     pantoprazole (PROTONIX) 40 MG tablet Take 1 tablet (40 mg total) by mouth 2 (two) times daily before a meal. 60 tablet 11   predniSONE (DELTASONE) 10 MG tablet Take 10 mg by mouth every morning.     tamsulosin (FLOMAX) 0.4 MG CAPS capsule Take 0.4 mg by mouth daily.      Allergies as of 07/06/2023 - Review Complete  07/06/2023  Allergen Reaction Noted   Advil [ibuprofen] Diarrhea 07/20/2014    Family History  Problem Relation Age of Onset   Diabetes Mother    Alzheimer's disease Mother    Diabetes Sister    Colon cancer Neg Hx    Liver disease Neg Hx     Social History   Socioeconomic History   Marital status: Single  Spouse name: Not on file   Number of children: Not on file   Years of education: Not on file   Highest education level: Not on file  Occupational History   Not on file  Tobacco Use   Smoking status: Every Day    Current packs/day: 1.00    Average packs/day: 1 pack/day for 45.0 years (45.0 ttl pk-yrs)    Types: Cigarettes    Passive exposure: Current   Smokeless tobacco: Never  Vaping Use   Vaping status: Never Used  Substance and Sexual Activity   Alcohol use: Yes    Alcohol/week: 9.0 standard drinks of alcohol    Types: 9 Cans of beer per week    Comment: couple drinks a week per pt as of 02/15/23   Drug use: Not Currently    Types: Marijuana, Cocaine    Comment: Cocaine last week   Sexual activity: Not on file  Other Topics Concern   Not on file  Social History Narrative   Not on file   Social Drivers of Health   Financial Resource Strain: Not on file  Food Insecurity: No Food Insecurity (04/28/2023)   Hunger Vital Sign    Worried About Running Out of Food in the Last Year: Never true    Ran Out of Food in the Last Year: Never true  Transportation Needs: No Transportation Needs (04/28/2023)   PRAPARE - Administrator, Civil Service (Medical): No    Lack of Transportation (Non-Medical): No  Physical Activity: Not on file  Stress: Not on file  Social Connections: Unknown (11/21/2021)   [facility] from [facility], [facility]   Social Network    Social Network: Not on file  Intimate Partner Violence: Not At Risk (04/28/2023)   Humiliation, Afraid, Rape, and Kick questionnaire    Fear of Current or Ex-Partner: No    Emotionally Abused: No     Physically Abused: No    Sexually Abused: No     Review of Systems   Gen: Denies any fever, chills, loss of appetite, change in weight or weight loss CV: Denies chest pain, heart palpitations, syncope, edema  Resp: + Dyspnea on exertion.  Denies shortness of breath with rest, cough, wheezing, coughing up blood, and pleurisy. GI: see HPI GU : Denies urinary burning, blood in urine, urinary frequency, and urinary incontinence. MS: + joint pain and cramping Derm: Denies rash, itching, dry skin, hives. Psych: Denies depression, anxiety, memory loss, hallucinations, and confusion. Heme: + Coffee-ground emesis Neuro:  Denies any headaches, dizziness, paresthesias, shaking  Physical Exam   Vital Signs in last 24 hours: Temp:  [98.6 F (37 C)] 98.6 F (37 C) (12/20 1032) Pulse Rate:  [114] 114 (12/20 1032) Resp:  [30] 30 (12/20 1032) BP: (85-101)/(53-66) 101/66 (12/20 1036) SpO2:  [100 %] 100 % (12/20 1032) Weight:  [79.4 kg] 79.4 kg (12/20 1033)    General:   Alert,  pleasant and cooperative in NAD Head:  Normocephalic and atraumatic. Eyes:  Sclera clear, no icterus.   Conjunctiva pink. Ears:  Normal auditory acuity. Mouth: Poor dentition Lungs:  Clear throughout to auscultation.   No wheezes, crackles, or rhonchi. No acute distress. Heart:  Regular rate and rhythm; no murmurs, clicks, rubs,  or gallops. Abdomen:  Soft, nontender and nondistended. No masses, hepatosplenomegaly or hernias noted. Normal bowel sounds, without guarding, and without rebound.   Rectal: deferred   Extremities:  Without clubbing or edema. Neurologic:  Alert and  oriented x4.  Psych:  Alert and cooperative. Normal mood and affect.  Intake/Output from previous day: No intake/output data recorded. Intake/Output this shift: No intake/output data recorded.  Labs/Studies   Recent Labs Recent Labs    07/06/23 1109  WBC 12.1*  HGB 6.0*  HCT 22.0*  PLT 391   BMET Recent Labs    07/06/23 1109   NA 135  K 3.6  CL 103  CO2 23  GLUCOSE 151*  BUN 34*  CREATININE 0.87  CALCIUM 8.7*   LFT Recent Labs    07/06/23 1109  PROT 6.1*  ALBUMIN 3.2*  AST 11*  ALT 9  ALKPHOS 42  BILITOT 0.2   PT/INR No results for input(s): "LABPROT", "INR" in the last 72 hours. Hepatitis Panel No results for input(s): "HEPBSAG", "HCVAB", "HEPAIGM", "HEPBIGM" in the last 72 hours. C-Diff No results for input(s): "CDIFFTOX" in the last 72 hours.  Radiology/Studies No results found.   Assessment   Sean Thomas is a 62 y.o. year old male COPD, HTN, HLD, IDA, dysphagia, Candida esophagitis, hiatal hernia with Sheria Lang lesions in 2021, and adenomatous colon polyps who presents to the hospital today with dyspnea for 3 days.  Found to have symptomatic anemia with hemoglobin 6.  GI consulted for further evaluation.  Anemia, coffee-ground emesis: Patient with significant upper GI history including recurrent esophagitis secondary to Candida infection, Schatzki's ring, and esophageal ulceration likely secondary to chronic NSAID and steroid use.  EGD in January while inpatient with nonsevere candidiasis esophagitis treated with antifungal as well as evidence of esophageal ulceration and small tiny Cameron erosions.  He also underwent colonoscopy as outlined in HPI.  He had recurrent ED visit for anemia and melena in October and was recommended to have outpatient EGD for further evaluation given positive drug screen for cocaine for which he never followed up to perform. Current hgb 6 and reports of coffee ground emesis for at least the past 48 hours.  He denies any significant NSAID use (only occasional use however did admit to BC/Goody powders 3 times per week) or any recent other drug use or significant alcohol use.Marland Kitchen  Awaiting type and screen for blood transfusion.  Has had some hypotension on arrival but quickly normalized.  Given his history we need to rule out recent cocaine use prior to determining  ability to perform EGD while inpatient. Treat with PPI twice daily and blood transfusion.  Continue to trend H/H.  Addendum: UDS with positive benzos and cocaine.  Given this inpatient procedures unable to be performed.  Recommend treating with blood transfusion and complete cessation of NSAIDs.  If he develops significant hemodynamic instability and ongoing coffee-ground emesis or obvious hematemesis and it is felt life-threatening then we could pursue at that time while inpatient.  Otherwise we will plan for outpatient EGD I will see him in follow-up outpatient and repeat UDS.   Plan / Recommendations   Agree with blood transfusion today N.p.o. for now, clear liquids and slowly advance as tolerated if no plans for procedure Urine drug screen PPI IV BID Avoid NSAIDs Trend H/H, transfuse for hemoglobin less than 7 Plan for outpatient EGD given positive UDS for cocaine.  Will need repeat UDS prior to scheduling outpatient EGD.  CBC 1 week post discharge  May give at least clear liquids and adv diet as tolerated.    07/06/2023, 12:28 PM  Brooke Bonito, MSN, FNP-BC, AGACNP-BC [facility]

## 2023-07-07 DIAGNOSIS — Z981 Arthrodesis status: Secondary | ICD-10-CM | POA: Diagnosis not present

## 2023-07-07 DIAGNOSIS — G4700 Insomnia, unspecified: Secondary | ICD-10-CM | POA: Diagnosis present

## 2023-07-07 DIAGNOSIS — R0789 Other chest pain: Secondary | ICD-10-CM | POA: Diagnosis present

## 2023-07-07 DIAGNOSIS — G894 Chronic pain syndrome: Secondary | ICD-10-CM | POA: Diagnosis present

## 2023-07-07 DIAGNOSIS — J449 Chronic obstructive pulmonary disease, unspecified: Secondary | ICD-10-CM | POA: Diagnosis present

## 2023-07-07 DIAGNOSIS — F17200 Nicotine dependence, unspecified, uncomplicated: Secondary | ICD-10-CM | POA: Diagnosis not present

## 2023-07-07 DIAGNOSIS — K922 Gastrointestinal hemorrhage, unspecified: Principal | ICD-10-CM

## 2023-07-07 DIAGNOSIS — Z820 Family history of epilepsy and other diseases of the nervous system: Secondary | ICD-10-CM | POA: Diagnosis not present

## 2023-07-07 DIAGNOSIS — N400 Enlarged prostate without lower urinary tract symptoms: Secondary | ICD-10-CM | POA: Diagnosis present

## 2023-07-07 DIAGNOSIS — K921 Melena: Secondary | ICD-10-CM | POA: Diagnosis present

## 2023-07-07 DIAGNOSIS — Z886 Allergy status to analgesic agent status: Secondary | ICD-10-CM | POA: Diagnosis not present

## 2023-07-07 DIAGNOSIS — F1490 Cocaine use, unspecified, uncomplicated: Secondary | ICD-10-CM

## 2023-07-07 DIAGNOSIS — D62 Acute posthemorrhagic anemia: Secondary | ICD-10-CM | POA: Diagnosis present

## 2023-07-07 DIAGNOSIS — Z79899 Other long term (current) drug therapy: Secondary | ICD-10-CM | POA: Diagnosis not present

## 2023-07-07 DIAGNOSIS — I10 Essential (primary) hypertension: Secondary | ICD-10-CM | POA: Diagnosis present

## 2023-07-07 DIAGNOSIS — D500 Iron deficiency anemia secondary to blood loss (chronic): Secondary | ICD-10-CM | POA: Diagnosis present

## 2023-07-07 DIAGNOSIS — E785 Hyperlipidemia, unspecified: Secondary | ICD-10-CM | POA: Diagnosis present

## 2023-07-07 DIAGNOSIS — Z833 Family history of diabetes mellitus: Secondary | ICD-10-CM | POA: Diagnosis not present

## 2023-07-07 DIAGNOSIS — F32A Depression, unspecified: Secondary | ICD-10-CM | POA: Diagnosis present

## 2023-07-07 DIAGNOSIS — I959 Hypotension, unspecified: Secondary | ICD-10-CM | POA: Diagnosis present

## 2023-07-07 DIAGNOSIS — Z7952 Long term (current) use of systemic steroids: Secondary | ICD-10-CM | POA: Diagnosis not present

## 2023-07-07 DIAGNOSIS — F1410 Cocaine abuse, uncomplicated: Secondary | ICD-10-CM | POA: Diagnosis present

## 2023-07-07 DIAGNOSIS — F1120 Opioid dependence, uncomplicated: Secondary | ICD-10-CM | POA: Diagnosis present

## 2023-07-07 DIAGNOSIS — K219 Gastro-esophageal reflux disease without esophagitis: Secondary | ICD-10-CM | POA: Diagnosis present

## 2023-07-07 DIAGNOSIS — F17210 Nicotine dependence, cigarettes, uncomplicated: Secondary | ICD-10-CM | POA: Diagnosis present

## 2023-07-07 DIAGNOSIS — E1165 Type 2 diabetes mellitus with hyperglycemia: Secondary | ICD-10-CM | POA: Diagnosis present

## 2023-07-07 DIAGNOSIS — E119 Type 2 diabetes mellitus without complications: Secondary | ICD-10-CM | POA: Diagnosis present

## 2023-07-07 DIAGNOSIS — Z7951 Long term (current) use of inhaled steroids: Secondary | ICD-10-CM | POA: Diagnosis not present

## 2023-07-07 LAB — CBC
HCT: 21.9 % — ABNORMAL LOW (ref 39.0–52.0)
Hemoglobin: 6.4 g/dL — CL (ref 13.0–17.0)
MCH: 21.1 pg — ABNORMAL LOW (ref 26.0–34.0)
MCHC: 29.2 g/dL — ABNORMAL LOW (ref 30.0–36.0)
MCV: 72.3 fL — ABNORMAL LOW (ref 80.0–100.0)
Platelets: 233 10*3/uL (ref 150–400)
RBC: 3.03 MIL/uL — ABNORMAL LOW (ref 4.22–5.81)
RDW: 22.1 % — ABNORMAL HIGH (ref 11.5–15.5)
WBC: 10.5 10*3/uL (ref 4.0–10.5)
nRBC: 0 % (ref 0.0–0.2)

## 2023-07-07 LAB — BASIC METABOLIC PANEL
Anion gap: 6 (ref 5–15)
BUN: 19 mg/dL (ref 8–23)
CO2: 24 mmol/L (ref 22–32)
Calcium: 8.1 mg/dL — ABNORMAL LOW (ref 8.9–10.3)
Chloride: 109 mmol/L (ref 98–111)
Creatinine, Ser: 0.77 mg/dL (ref 0.61–1.24)
GFR, Estimated: >60 mL/min (ref >60)
Glucose, Bld: 92 mg/dL (ref 70–99)
Potassium: 3.6 mmol/L (ref 3.5–5.1)
Sodium: 139 mmol/L (ref 135–145)

## 2023-07-07 LAB — HEMOGLOBIN A1C
Hgb A1c MFr Bld: 5.3 % (ref 4.8–5.6)
Mean Plasma Glucose: 105.41 mg/dL

## 2023-07-07 LAB — HEMOGLOBIN AND HEMATOCRIT, BLOOD
HCT: 21.1 % — ABNORMAL LOW (ref 39.0–52.0)
Hemoglobin: 6.2 g/dL — CL (ref 13.0–17.0)

## 2023-07-07 LAB — PREPARE RBC (CROSSMATCH)

## 2023-07-07 MED ORDER — MELATONIN 3 MG PO TABS
6.0000 mg | ORAL_TABLET | Freq: Every evening | ORAL | Status: DC | PRN
  Administered 2023-07-07: 6 mg via ORAL
  Filled 2023-07-07: qty 2

## 2023-07-07 MED ORDER — SODIUM CHLORIDE 0.9% IV SOLUTION: Freq: Once | INTRAVENOUS | Status: DC

## 2023-07-07 NOTE — Plan of Care (Signed)
  Problem: Education: Goal: Knowledge of General Education information will improve Description Including pain rating scale, medication(s)/side effects and non-pharmacologic comfort measures Outcome: Progressing   Problem: Health Behavior/Discharge Planning: Goal: Ability to manage health-related needs will improve Outcome: Progressing   

## 2023-07-07 NOTE — Progress Notes (Signed)
   07/07/23 1443  TOC Brief Assessment  Insurance and Status Reviewed  Patient has primary care physician Yes  Home environment has been reviewed From home  Prior level of function: Independent  Prior/Current Home Services No current home services  Social Drivers of Health Review SDOH reviewed no interventions necessary  Readmission risk has been reviewed Yes  Transition of care needs no transition of care needs at this time     Transition of Care Department [facility]) has reviewed patient and no TOC needs have been identified at this time. We will continue to monitor patient advancement through interdisciplinary progression rounds. If new patient transition needs arise, please place a TOC consult.

## 2023-07-07 NOTE — Care Management Obs Status (Signed)
MEDICARE OBSERVATION STATUS NOTIFICATION   Patient Details  Name: Sean Thomas MRN: [id number] Date of Birth: 07/22/1960   Medicare Observation Status Notification Given:  Yes    Daxtyn Rottenberg Marsh Dolly, LCSW 07/07/2023, 10:32 AM

## 2023-07-07 NOTE — Progress Notes (Signed)
PROGRESS NOTE  Sean Thomas:[id number] DOB: [birth year] DOA: 07/06/2023 PCP: Jamey Reas, PA-C  Brief History:  62 year old male with a history of cocaine abuse, GI bleed, hypertension, COPD, tobacco abuse, BPH, and impaired glucose tolerance presenting with shortness of breath and generalized weakness.  The patient states that this has been going on for about 2 days.  He began having nausea and vomiting with coffee-ground material on 07/04/2023.  He also noted black stools during this period of time.  He continues to complain of constant chest pain.  There is no exacerbating or alleviating factors.  He has had some dyspnea on exertion.  He denies any hemoptysis. Notably, the patient was recently mated to the hospital from 04/28/2023 to 04/30/2023 for upper GI bleed and acute blood loss anemia.  His urine drug screen was cocaine positive at that time.  The patient was transfused with 1 unit PRBC..  Endoscopy was deferred at that time secondary to his cocaine.  The patient instructed to follow-up in 2 weeks for repeat UDS prior to determining need for repeat endoscopy.  The patient was discharged home with PPI twice daily.  Unfortunately, the patient continues to use Goody's powders for his chronic pain.  He states that he may have been exposed to cocaine as well, but denies actually using it.  He denies any fevers, chills, headache, neck pain, abdominal pain, hematochezia, dysuria, hematuria. In the ED, the patient was afebrile.  His BP was soft with SBP in the 90s.  WBC 12.1, hemoglobin 6.0, platelets 391.  Sodium 135, potassium 3.6, bicarbonate 23, serum creatinine 0.7.  LFTs were unremarkable.  EKG with sinus tachycardia with nonspecific ST changes.  GI was consulted to assist with management.  FOBT was positive with brown stool.   Assessment/Plan: Coffee-ground emesis/FOBT positive/acute on chronic anemia/symptomatic anemia -Unfortunately, the patient continues to use  Goody's powders -Obtain UDS--positive cocaine -continue pantoprazole IV twice daily -GI consult appreciated -Clear liquid diet--continue -Patient presented with hemoglobin 6.0 -Transfused 2 units PRBC>>Hgb remains 6.4 -12/21--transfuse one additional unit PRBC -12/21--discussed with Dr. Lorette Ang EGD if Hgb without improvement in next 24 hours -npo after MN   Atypical chest pain -Cycle troponins 5>>3 -Personally reviewed EKG--sinus rhythm, nonspecific ST change -resolved   Tobacco use disorder -Tobacco cessation discussed -Continue Wellbutrin   Polysubstance abuse -Patient continues to use NSAIDs, intermittent alcohol, and cocaine -UDS positive cocaine and benzo   Chronic pain syndrome/opioid dependence -Percocet 5/325, #180, last refill 02/20/2023 -Percocet 5.325, #12, last refill 04/30/2023 -Continue gabapentin  Hyperglycemia -12/20 A1C--5.3       Family Communication:   no Family at bedside  Consultants:  GI  Code Status:  FULL  DVT Prophylaxis:  SCDs   Procedures: As Listed in Progress Note Above  Antibiotics: None      Subjective: Patient denies fevers, chills, headache, chest pain, dyspnea, nausea, vomiting, diarrhea, abdominal pain, dysuria, hematuria, hematochezia, and melena.   Objective: Vitals:   07/07/23 0500 07/07/23 0800 07/07/23 0812 07/07/23 1637  BP: 106/68 98/65  104/81  Pulse: 90 68  68  Resp: 17 20  20   Temp: 97.6 F (36.4 C) 98.1 F (36.7 C)  98.1 F (36.7 C)  TempSrc: Oral Oral  Axillary  SpO2: 92% 96% 97% 100%  Weight:      Height:        Intake/Output Summary (Last 24 hours) at 07/07/2023 1722 Last data filed at 07/07/2023 0800 Gross per 24  hour  Intake 1140 ml  Output 475 ml  Net 665 ml   Weight change:  Exam:  General:  Pt is alert, follows commands appropriately, not in acute distress HEENT: No icterus, No thrush, No neck mass, [place]/AT Cardiovascular: RRR, S1/S2, no rubs, no gallops Respiratory:  diminished BS at bases.  No wheeze Abdomen: Soft/+BS, non tender, non distended, no guarding Extremities: No edema, No lymphangitis, No petechiae, No rashes, no synovitis   Data Reviewed: I have personally reviewed following labs and imaging studies Basic Metabolic Panel: Recent Labs  Lab 07/06/23 1109 07/07/23 0115  NA 135 139  K 3.6 3.6  CL 103 109  CO2 23 24  GLUCOSE 151* 92  BUN 34* 19  CREATININE 0.87 0.77  CALCIUM 8.7* 8.1*   Liver Function Tests: Recent Labs  Lab 07/06/23 1109  AST 11*  ALT 9  ALKPHOS 42  BILITOT 0.2  PROT 6.1*  ALBUMIN 3.2*   No results for input(s): "LIPASE", "AMYLASE" in the last 168 hours. No results for input(s): "AMMONIA" in the last 168 hours. Coagulation Profile: No results for input(s): "INR", "PROTIME" in the last 168 hours. CBC: Recent Labs  Lab 07/06/23 1109 07/07/23 0003 07/07/23 0115  WBC 12.1*  --  10.5  NEUTROABS 7.7  --   --   HGB 6.0* 6.2* 6.4*  HCT 22.0* 21.1* 21.9*  MCV 69.6*  --  72.3*  PLT 391  --  233   Cardiac Enzymes: No results for input(s): "CKTOTAL", "CKMB", "CKMBINDEX", "TROPONINI" in the last 168 hours. BNP: Invalid input(s): "POCBNP" CBG: No results for input(s): "GLUCAP" in the last 168 hours. HbA1C: Recent Labs    07/07/23 0115  HGBA1C 5.3   Urine analysis:    Component Value Date/Time   COLORURINE YELLOW 04/28/2023 1925   APPEARANCEUR CLEAR 04/28/2023 1925   APPEARANCEUR Clear 04/06/2021 1011   LABSPEC 1.016 04/28/2023 1925   PHURINE 5.0 04/28/2023 1925   GLUCOSEU NEGATIVE 04/28/2023 1925   HGBUR NEGATIVE 04/28/2023 1925   BILIRUBINUR NEGATIVE 04/28/2023 1925   BILIRUBINUR Negative 04/06/2021 1011   KETONESUR NEGATIVE 04/28/2023 1925   PROTEINUR NEGATIVE 04/28/2023 1925   NITRITE NEGATIVE 04/28/2023 1925   LEUKOCYTESUR NEGATIVE 04/28/2023 1925   Sepsis Labs: @LABRCNTIP (procalcitonin:4,lacticidven:4) )No results found for this or any previous visit (from the past 240 hours).    Scheduled Meds:  sodium chloride   Intravenous Once   buPROPion  150 mg Oral BID   fluticasone furoate-vilanterol  1 puff Inhalation Daily   And   umeclidinium bromide  1 puff Inhalation Daily   gabapentin  600 mg Oral TID   melatonin  6 mg Oral Once   pantoprazole (PROTONIX) IV  40 mg Intravenous Q12H   tamsulosin  0.4 mg Oral Daily   Continuous Infusions:  Procedures/Studies: DG Chest 2 View Result Date: 07/06/2023 CLINICAL DATA:  Shortness of breath. EXAM: CHEST - 2 VIEW COMPARISON:  April 28, 2023. FINDINGS: The heart size and mediastinal contours are within normal limits. Both lungs are clear. Multiple old left rib fractures are again noted. IMPRESSION: No active cardiopulmonary disease. Electronically Signed   By: Lupita Raider M.D.   On: 07/06/2023 12:44    Catarina Hartshorn, DO  [facility]  If 7PM-7AM, please contact night-coverage [email or web address] Password [email or web address] 07/07/2023, 5:22 PM   LOS: 0 days

## 2023-07-08 DIAGNOSIS — K922 Gastrointestinal hemorrhage, unspecified: Secondary | ICD-10-CM | POA: Diagnosis not present

## 2023-07-08 DIAGNOSIS — F1490 Cocaine use, unspecified, uncomplicated: Secondary | ICD-10-CM | POA: Diagnosis not present

## 2023-07-08 DIAGNOSIS — D62 Acute posthemorrhagic anemia: Secondary | ICD-10-CM | POA: Diagnosis not present

## 2023-07-08 LAB — CBC
HCT: 25.8 % — ABNORMAL LOW (ref 39.0–52.0)
Hemoglobin: 7.2 g/dL — ABNORMAL LOW (ref 13.0–17.0)
MCH: 21.1 pg — ABNORMAL LOW (ref 26.0–34.0)
MCHC: 27.9 g/dL — ABNORMAL LOW (ref 30.0–36.0)
MCV: 75.7 fL — ABNORMAL LOW (ref 80.0–100.0)
Platelets: 241 10*3/uL (ref 150–400)
RBC: 3.41 MIL/uL — ABNORMAL LOW (ref 4.22–5.81)
RDW: 22.5 % — ABNORMAL HIGH (ref 11.5–15.5)
WBC: 9 10*3/uL (ref 4.0–10.5)
nRBC: 0 % (ref 0.0–0.2)

## 2023-07-08 LAB — PREPARE RBC (CROSSMATCH)

## 2023-07-08 MED ORDER — PANTOPRAZOLE SODIUM 40 MG PO TBEC: 40.0000 mg | DELAYED_RELEASE_TABLET | Freq: Two times a day (BID) | ORAL | 2 refills | Status: DC

## 2023-07-08 MED ORDER — OXYCODONE-ACETAMINOPHEN 5-325 MG PO TABS: 1.0000 | ORAL_TABLET | Freq: Four times a day (QID) | ORAL | 0 refills | Status: DC | PRN

## 2023-07-08 MED ORDER — SODIUM CHLORIDE 0.9% IV SOLUTION: Freq: Once | INTRAVENOUS | Status: AC

## 2023-07-08 MED ORDER — OXYCODONE-ACETAMINOPHEN 5-325 MG PO TABS
1.0000 | ORAL_TABLET | Freq: Four times a day (QID) | ORAL | Status: DC | PRN
  Administered 2023-07-08: 1 via ORAL
  Filled 2023-07-08: qty 1

## 2023-07-08 MED ORDER — MELATONIN 3 MG PO TABS: 6.0000 mg | ORAL_TABLET | Freq: Once | ORAL | Status: AC

## 2023-07-08 NOTE — Progress Notes (Signed)
Patient vomited x 1 brown emesis in blue bag 400cc noted. Patient agitated and his IV was bleeding removed and started blood in right hand, patient pulled gown off. BM x 1 states " See its black , black stool noted in toilet. Dr. Arbutus Leas is aware.    07/08/23 1300  Output (mL)  Emesis 400 mL

## 2023-07-08 NOTE — Plan of Care (Signed)
  Problem: Education: Goal: Knowledge of General Education information will improve Description Including pain rating scale, medication(s)/side effects and non-pharmacologic comfort measures Outcome: Progressing   Problem: Health Behavior/Discharge Planning: Goal: Ability to manage health-related needs will improve Outcome: Progressing   

## 2023-07-08 NOTE — Progress Notes (Signed)
Patient seen this morning.  Hemoglobin 7.2.  No melena hematochezia.  No further nausea or vomiting.    EGD would be the next step in GI evaluation given his presenting symptoms.  Discussed case with anesthesia, since he is not actively bleeding and his drug screen is positive for cocaine, this will not be done this admission.   Discussed with hospitalist, 1 more unit PRBCs ordered.  Needs PPI 40 mg twice daily.  We will arrange follow-up in our office in 1 to 2 weeks to discuss urgent outpatient EGD for evaluation.  Strongly advised patient to avoid cocaine as well as NSAID cessation.

## 2023-07-08 NOTE — Discharge Summary (Signed)
Physician Discharge Summary   Patient: Sean Thomas MRN: [id number] DOB: 1961/06/12  Admit date:     07/06/2023  Discharge date: 07/08/23  Discharge Physician: Onalee Hua Deja Pisarski   PCP: Jamey Reas, PA-C   Recommendations at discharge:   Please follow up with primary care provider within 1-2 weeks  Please repeat BMP and CBC in one week    Hospital Course: 62 year old male with a history of cocaine abuse, GI bleed, hypertension, COPD, tobacco abuse, BPH, and impaired glucose tolerance presenting with shortness of breath and generalized weakness.  The patient states that this has been going on for about 2 days.  He began having nausea and vomiting with coffee-ground material on 07/04/2023.  He also noted black stools during this period of time.  He continues to complain of constant chest pain.  There is no exacerbating or alleviating factors.  He has had some dyspnea on exertion.  He denies any hemoptysis. Notably, the patient was recently mated to the hospital from 04/28/2023 to 04/30/2023 for upper GI bleed and acute blood loss anemia.  His urine drug screen was cocaine positive at that time.  The patient was transfused with 1 unit PRBC..  Endoscopy was deferred at that time secondary to his cocaine.  The patient instructed to follow-up in 2 weeks for repeat UDS prior to determining need for repeat endoscopy.  The patient was discharged home with PPI twice daily.  Unfortunately, the patient continues to use Goody's powders for his chronic pain.  He states that he may have been exposed to cocaine as well, but denies actually using it.  He denies any fevers, chills, headache, neck pain, abdominal pain, hematochezia, dysuria, hematuria. In the ED, the patient was afebrile.  His BP was soft with SBP in the 90s.  WBC 12.1, hemoglobin 6.0, platelets 391.  Sodium 135, potassium 3.6, bicarbonate 23, serum creatinine 0.7.  LFTs were unremarkable.  EKG with sinus tachycardia with nonspecific ST changes.   GI was consulted to assist with management.  FOBT was positive with brown stool.  Assessment and Plan: Coffee-ground emesis/FOBT positive/acute on chronic anemia/symptomatic anemia -Unfortunately, the patient continues to use Goody's powders -Obtain UDS--positive cocaine -continue pantoprazole IV twice daily -GI consult appreciated -Clear liquid diet-->>advanced to regular diet which pt tolerated -Patient presented with hemoglobin 6.0 -Transfused 2 units PRBC>>Hgb remains 6.4 -12/21--transfuse one additional unit PRBC  -12/22--discussed with Dr. Georgette Shell deferred for now due to patient continuing to have cocaine in system and risks with anesthesia outweigh benefit as Hgb has stabilized and pt has no evidence on continued active bleeding -outpt follow has been arranged with GI -12/22--transfuse one more unit PRBC (4 total for admission)   Atypical chest pain -Cycle troponins 5>>3 -Personally reviewed EKG--sinus rhythm, nonspecific ST change -resolved   Tobacco use disorder -Tobacco cessation discussed -Continue Wellbutrin   Polysubstance abuse -Patient continues to use NSAIDs, intermittent alcohol, and cocaine -UDS positive cocaine and benzo   Chronic pain syndrome/opioid dependence -Percocet 5/325, #180, last refill 02/20/2023 -Percocet 5.325, #12, last refill 04/30/2023 -Continue gabapentin   Hyperglycemia -12/20 A1C--5.3        Consultants: GI Procedures performed: none  Disposition: Home Diet recommendation:  Regular diet DISCHARGE MEDICATION: Allergies as of 07/08/2023   No Known Allergies      Medication List     STOP taking these medications    BC Fast Pain Relief Arthritis [phone] MG Pack Generic drug: Aspirin-Salicylamide-Caffeine   buPROPion 150 MG 12 hr tablet Commonly known as: WELLBUTRIN  SR   metFORMIN 500 MG tablet Commonly known as: GLUCOPHAGE   predniSONE 10 MG tablet Commonly known as: DELTASONE       TAKE these  medications    albuterol 108 (90 Base) MCG/ACT inhaler Commonly known as: VENTOLIN HFA Inhale 1-2 puffs into the lungs every 6 (six) hours as needed for wheezing or shortness of breath.   gabapentin 600 MG tablet Commonly known as: NEURONTIN Take 600 mg by mouth 3 (three) times daily.   ipratropium-albuterol 0.5-2.5 (3) MG/3ML Soln Commonly known as: DUONEB Inhale 3 mLs into the lungs every 6 (six) hours as needed (shortness of breath).   melatonin 3 MG Tabs tablet Take 2 tablets (6 mg total) by mouth once for 1 dose.   oxyCODONE-acetaminophen 5-325 MG tablet Commonly known as: PERCOCET/ROXICET Take 1 tablet by mouth every 6 (six) hours as needed for moderate pain (pain score 4-6).   pantoprazole 40 MG tablet Commonly known as: PROTONIX Take 1 tablet (40 mg total) by mouth 2 (two) times daily before a meal.   tamsulosin 0.4 MG Caps capsule Commonly known as: FLOMAX Take 0.4 mg by mouth daily.   Trelegy Ellipta 100-62.5-25 MCG/INH Aepb Generic drug: Fluticasone-Umeclidin-Vilant Inhale 1 puff into the lungs daily.        Discharge Exam: Filed Weights   07/06/23 1033  Weight: 79.4 kg   HEENT:  [place]/AT, No thrush, no icterus CV:  RRR, no rub, no S3, no S4 Lung:  CTA, no wheeze, no rhonchi Abd:  soft/+BS, NT Ext:  No edema, no lymphangitis, no synovitis, no rash   Condition at discharge: stable  The results of significant diagnostics from this hospitalization (including imaging, microbiology, ancillary and laboratory) are listed below for reference.   Imaging Studies: DG Chest 2 View Result Date: 07/06/2023 CLINICAL DATA:  Shortness of breath. EXAM: CHEST - 2 VIEW COMPARISON:  April 28, 2023. FINDINGS: The heart size and mediastinal contours are within normal limits. Both lungs are clear. Multiple old left rib fractures are again noted. IMPRESSION: No active cardiopulmonary disease. Electronically Signed   By: Lupita Raider M.D.   On: 07/06/2023 12:44     Microbiology: Results for orders placed or performed during the hospital encounter of 04/28/23  Resp panel by RT-PCR (RSV, Flu A&B, Covid) Anterior Nasal Swab     Status: None   Collection Time: 04/28/23  5:20 PM   Specimen: Anterior Nasal Swab  Result Value Ref Range Status   SARS Coronavirus 2 by RT PCR NEGATIVE NEGATIVE Final    Comment: (NOTE) SARS-CoV-2 target nucleic acids are NOT DETECTED.  The SARS-CoV-2 RNA is generally detectable in upper respiratory specimens during the acute phase of infection. The lowest concentration of SARS-CoV-2 viral copies this assay can detect is 138 copies/mL. A negative result does not preclude SARS-Cov-2 infection and should not be used as the sole basis for treatment or other patient management decisions. A negative result may occur with  improper specimen collection/handling, submission of specimen other than nasopharyngeal swab, presence of viral mutation(s) within the areas targeted by this assay, and inadequate number of viral copies(<138 copies/mL). A negative result must be combined with clinical observations, patient history, and epidemiological information. The expected result is Negative.  Fact Sheet for Patients:  BloggerCourse.com  Fact Sheet for Healthcare Providers:  SeriousBroker.it  This test is no t yet approved or cleared by the [place] FDA and  has been authorized for detection and/or diagnosis of SARS-CoV-2 by FDA under an Emergency  Use Authorization (EUA). This EUA will remain  in effect (meaning this test can be used) for the duration of the COVID-19 declaration under Section 564(b)(1) of the Act, 21 U.S.C.section 360bbb-3(b)(1), unless the authorization is terminated  or revoked sooner.       Influenza A by PCR NEGATIVE NEGATIVE Final   Influenza B by PCR NEGATIVE NEGATIVE Final    Comment: (NOTE) The Xpert Xpress SARS-CoV-2/FLU/RSV plus assay is  intended as an aid in the diagnosis of influenza from Nasopharyngeal swab specimens and should not be used as a sole basis for treatment. Nasal washings and aspirates are unacceptable for Xpert Xpress SARS-CoV-2/FLU/RSV testing.  Fact Sheet for Patients: BloggerCourse.com  Fact Sheet for Healthcare Providers: SeriousBroker.it  This test is not yet approved or cleared by the [place] FDA and has been authorized for detection and/or diagnosis of SARS-CoV-2 by FDA under an Emergency Use Authorization (EUA). This EUA will remain in effect (meaning this test can be used) for the duration of the COVID-19 declaration under Section 564(b)(1) of the Act, 21 U.S.C. section 360bbb-3(b)(1), unless the authorization is terminated or revoked.     Resp Syncytial Virus by PCR NEGATIVE NEGATIVE Final    Comment: (NOTE) Fact Sheet for Patients: BloggerCourse.com  Fact Sheet for Healthcare Providers: SeriousBroker.it  This test is not yet approved or cleared by the [place] FDA and has been authorized for detection and/or diagnosis of SARS-CoV-2 by FDA under an Emergency Use Authorization (EUA). This EUA will remain in effect (meaning this test can be used) for the duration of the COVID-19 declaration under Section 564(b)(1) of the Act, 21 U.S.C. section 360bbb-3(b)(1), unless the authorization is terminated or revoked.  Performed at [facility], [address], [place], [place] [address]     Labs: CBC: Recent Labs  Lab 07/06/23 1109 07/07/23 0003 07/07/23 0115 07/08/23 0355  WBC 12.1*  --  10.5 9.0  NEUTROABS 7.7  --   --   --   HGB 6.0* 6.2* 6.4* 7.2*  HCT 22.0* 21.1* 21.9* 25.8*  MCV 69.6*  --  72.3* 75.7*  PLT 391  --  233 241   Basic Metabolic Panel: Recent Labs  Lab 07/06/23 1109 07/07/23 0115  NA 135 139  K 3.6 3.6  CL 103 109  CO2 23 24  GLUCOSE 151* 92   BUN 34* 19  CREATININE 0.87 0.77  CALCIUM 8.7* 8.1*   Liver Function Tests: Recent Labs  Lab 07/06/23 1109  AST 11*  ALT 9  ALKPHOS 42  BILITOT 0.2  PROT 6.1*  ALBUMIN 3.2*   CBG: No results for input(s): "GLUCAP" in the last 168 hours.  Discharge time spent: greater than 30 minutes.  Signed: Catarina Hartshorn, MD [facility] 07/08/2023

## 2023-07-10 LAB — TYPE AND SCREEN
ABO/RH(D): A POS
Antibody Screen: NEGATIVE
Unit division: 0
Unit division: 0
Unit division: 0
Unit division: 0

## 2023-07-10 LAB — BPAM RBC
Blood Product Expiration Date: 202501022359
Blood Product Expiration Date: 202501132359
Blood Product Expiration Date: 202501012359
Blood Product Expiration Date: 202501012359
ISSUE DATE / TIME: 202412210435
ISSUE DATE / TIME: 202412221127
ISSUE DATE / TIME: 202412201544
ISSUE DATE / TIME: 202412201841
Unit Type and Rh: 6200
Unit Type and Rh: 6200
Unit Type and Rh: 6200
Unit Type and Rh: 6200

## 2023-07-12 ENCOUNTER — Other Ambulatory Visit: Payer: Self-pay | Admitting: *Deleted

## 2023-07-12 ENCOUNTER — Encounter: Payer: Self-pay | Admitting: Gastroenterology

## 2023-07-12 DIAGNOSIS — K921 Melena: Secondary | ICD-10-CM

## 2023-07-12 DIAGNOSIS — D509 Iron deficiency anemia, unspecified: Secondary | ICD-10-CM

## 2023-07-12 NOTE — Telephone Encounter (Signed)
Spoke to pt, sister Risk analyst) informed her of recommendations. She voiced understanding. Informed her to have pt to do labs at [facility] next week.  Marchelle Folks you can call pt's sister to make OV. Labs entered into Epic.

## 2023-07-12 NOTE — Telephone Encounter (Signed)
Called pt but phone number not in service so I mailed a letter to his home address asking that he get in touch with our office to schedule.

## 2023-08-06 ENCOUNTER — Telehealth: Payer: Self-pay | Admitting: Internal Medicine

## 2023-08-06 NOTE — Telephone Encounter (Signed)
Patient is deceased.

## 2023-08-18 DEATH — deceased
